# Patient Record
Sex: Male | Born: 1940 | Race: White | Hispanic: No | Smoking: Never smoker
Health system: Southern US, Community
[De-identification: ages and names within clinical notes are randomized; demographics above are authoritative.]

## PROBLEM LIST (undated history)

## (undated) DIAGNOSIS — R279 Unspecified lack of coordination: Secondary | ICD-10-CM

## (undated) DIAGNOSIS — J309 Allergic rhinitis, unspecified: Secondary | ICD-10-CM

## (undated) DIAGNOSIS — Z9181 History of falling: Secondary | ICD-10-CM

## (undated) DIAGNOSIS — R269 Unspecified abnormalities of gait and mobility: Secondary | ICD-10-CM

## (undated) DIAGNOSIS — M545 Low back pain: Secondary | ICD-10-CM

## (undated) DIAGNOSIS — L821 Other seborrheic keratosis: Secondary | ICD-10-CM

## (undated) DIAGNOSIS — G43109 Migraine with aura, not intractable, without status migrainosus: Secondary | ICD-10-CM

## (undated) DIAGNOSIS — R270 Ataxia, unspecified: Secondary | ICD-10-CM

## (undated) DIAGNOSIS — R42 Dizziness and giddiness: Secondary | ICD-10-CM

## (undated) DIAGNOSIS — R1310 Dysphagia, unspecified: Secondary | ICD-10-CM

## (undated) DIAGNOSIS — R252 Cramp and spasm: Secondary | ICD-10-CM

## (undated) DIAGNOSIS — M1990 Unspecified osteoarthritis, unspecified site: Secondary | ICD-10-CM

## (undated) DIAGNOSIS — K449 Diaphragmatic hernia without obstruction or gangrene: Secondary | ICD-10-CM

## (undated) DIAGNOSIS — I10 Essential (primary) hypertension: Secondary | ICD-10-CM

## (undated) DIAGNOSIS — G119 Hereditary ataxia, unspecified: Secondary | ICD-10-CM

## (undated) DIAGNOSIS — M542 Cervicalgia: Secondary | ICD-10-CM

## (undated) DIAGNOSIS — IMO0002 Reserved for concepts with insufficient information to code with codable children: Secondary | ICD-10-CM

## (undated) DIAGNOSIS — M2550 Pain in unspecified joint: Secondary | ICD-10-CM

## (undated) DIAGNOSIS — K219 Gastro-esophageal reflux disease without esophagitis: Secondary | ICD-10-CM

## (undated) DIAGNOSIS — K5730 Diverticulosis of large intestine without perforation or abscess without bleeding: Secondary | ICD-10-CM

## (undated) DIAGNOSIS — G2581 Restless legs syndrome: Secondary | ICD-10-CM

## (undated) DIAGNOSIS — E785 Hyperlipidemia, unspecified: Secondary | ICD-10-CM

## (undated) DIAGNOSIS — H9319 Tinnitus, unspecified ear: Secondary | ICD-10-CM

## (undated) DIAGNOSIS — IMO0001 Reserved for inherently not codable concepts without codable children: Secondary | ICD-10-CM

## (undated) DIAGNOSIS — R209 Unspecified disturbances of skin sensation: Secondary | ICD-10-CM

## (undated) DIAGNOSIS — H4389 Other disorders of vitreous body: Secondary | ICD-10-CM

## (undated) DIAGNOSIS — H532 Diplopia: Secondary | ICD-10-CM

## (undated) DIAGNOSIS — K209 Esophagitis, unspecified: Secondary | ICD-10-CM

## (undated) DIAGNOSIS — H9190 Unspecified hearing loss, unspecified ear: Secondary | ICD-10-CM

## (undated) DIAGNOSIS — N529 Male erectile dysfunction, unspecified: Secondary | ICD-10-CM

## (undated) DIAGNOSIS — R5381 Other malaise: Secondary | ICD-10-CM

## (undated) DIAGNOSIS — R5383 Other fatigue: Secondary | ICD-10-CM

## (undated) HISTORY — DX: Other malaise: R53.81

## (undated) HISTORY — DX: Diaphragmatic hernia without obstruction or gangrene: K44.9

## (undated) HISTORY — DX: Low back pain: M54.5

## (undated) HISTORY — DX: Unspecified hearing loss, unspecified ear: H91.90

## (undated) HISTORY — DX: Esophagitis, unspecified: K20.9

## (undated) HISTORY — DX: Other fatigue: R53.83

## (undated) HISTORY — DX: Tinnitus, unspecified ear: H93.19

## (undated) HISTORY — DX: Unspecified abnormalities of gait and mobility: R26.9

## (undated) HISTORY — PX: LAMINOTOMY / EXCISION DISK POSTERIOR CERVICAL SPINE: SUR749

## (undated) HISTORY — DX: Allergic rhinitis, unspecified: J30.9

## (undated) HISTORY — DX: Unspecified osteoarthritis, unspecified site: M19.90

## (undated) HISTORY — DX: Reserved for inherently not codable concepts without codable children: IMO0001

## (undated) HISTORY — DX: Dizziness and giddiness: R42

## (undated) HISTORY — DX: Restless legs syndrome: G25.81

## (undated) HISTORY — PX: OTHER SURGICAL HISTORY: SHX169

## (undated) HISTORY — DX: Other disorders of vitreous body: H43.89

## (undated) HISTORY — DX: Male erectile dysfunction, unspecified: N52.9

## (undated) HISTORY — DX: Diplopia: H53.2

## (undated) HISTORY — DX: Dysphagia, unspecified: R13.10

## (undated) HISTORY — DX: Unspecified disturbances of skin sensation: R20.9

## (undated) HISTORY — DX: History of falling: Z91.81

## (undated) HISTORY — DX: Migraine with aura, not intractable, without status migrainosus: G43.109

## (undated) HISTORY — PX: APPENDECTOMY: SHX54

## (undated) HISTORY — DX: Diverticulosis of large intestine without perforation or abscess without bleeding: K57.30

## (undated) HISTORY — DX: Hyperlipidemia, unspecified: E78.5

## (undated) HISTORY — DX: Other seborrheic keratosis: L82.1

## (undated) HISTORY — DX: Hereditary ataxia, unspecified: G11.9

## (undated) HISTORY — DX: Reserved for concepts with insufficient information to code with codable children: IMO0002

## (undated) HISTORY — DX: Pain in unspecified joint: M25.50

## (undated) HISTORY — DX: Cramp and spasm: R25.2

## (undated) HISTORY — DX: Unspecified lack of coordination: R27.9

## (undated) HISTORY — DX: Cervicalgia: M54.2

---

## 1980-06-01 DIAGNOSIS — K449 Diaphragmatic hernia without obstruction or gangrene: Secondary | ICD-10-CM

## 1980-06-01 HISTORY — DX: Diaphragmatic hernia without obstruction or gangrene: K44.9

## 1995-06-02 DIAGNOSIS — G2581 Restless legs syndrome: Secondary | ICD-10-CM

## 1995-06-02 DIAGNOSIS — G43109 Migraine with aura, not intractable, without status migrainosus: Secondary | ICD-10-CM

## 1995-06-02 DIAGNOSIS — K209 Esophagitis, unspecified without bleeding: Secondary | ICD-10-CM

## 1995-06-02 DIAGNOSIS — K5730 Diverticulosis of large intestine without perforation or abscess without bleeding: Secondary | ICD-10-CM

## 1995-06-02 HISTORY — DX: Diverticulosis of large intestine without perforation or abscess without bleeding: K57.30

## 1995-06-02 HISTORY — DX: Esophagitis, unspecified without bleeding: K20.90

## 1995-06-02 HISTORY — DX: Restless legs syndrome: G25.81

## 1995-06-02 HISTORY — DX: Migraine with aura, not intractable, without status migrainosus: G43.109

## 1996-06-01 DIAGNOSIS — IMO0002 Reserved for concepts with insufficient information to code with codable children: Secondary | ICD-10-CM

## 1996-06-01 HISTORY — DX: Reserved for concepts with insufficient information to code with codable children: IMO0002

## 1997-06-01 DIAGNOSIS — IMO0001 Reserved for inherently not codable concepts without codable children: Secondary | ICD-10-CM

## 1997-06-01 DIAGNOSIS — N529 Male erectile dysfunction, unspecified: Secondary | ICD-10-CM

## 1997-06-01 HISTORY — PX: OTHER SURGICAL HISTORY: SHX169

## 1997-06-01 HISTORY — DX: Male erectile dysfunction, unspecified: N52.9

## 1997-06-01 HISTORY — DX: Reserved for inherently not codable concepts without codable children: IMO0001

## 1997-11-19 ENCOUNTER — Ambulatory Visit (HOSPITAL_COMMUNITY): Admission: RE | Admit: 1997-11-19 | Discharge: 1997-11-20 | Payer: Self-pay | Admitting: Neurosurgery

## 1997-12-17 ENCOUNTER — Ambulatory Visit (HOSPITAL_COMMUNITY): Admission: RE | Admit: 1997-12-17 | Discharge: 1997-12-17 | Payer: Self-pay | Admitting: Neurosurgery

## 1998-01-07 ENCOUNTER — Ambulatory Visit (HOSPITAL_COMMUNITY): Admission: RE | Admit: 1998-01-07 | Discharge: 1998-01-07 | Payer: Self-pay | Admitting: Neurosurgery

## 1999-06-02 DIAGNOSIS — R42 Dizziness and giddiness: Secondary | ICD-10-CM

## 1999-06-02 HISTORY — DX: Dizziness and giddiness: R42

## 1999-06-02 HISTORY — PX: CARDIAC CATHETERIZATION: SHX172

## 1999-06-03 ENCOUNTER — Encounter: Payer: Self-pay | Admitting: Cardiology

## 1999-06-03 ENCOUNTER — Encounter: Payer: Self-pay | Admitting: Emergency Medicine

## 1999-06-03 ENCOUNTER — Inpatient Hospital Stay (HOSPITAL_COMMUNITY): Admission: EM | Admit: 1999-06-03 | Discharge: 1999-06-04 | Payer: Self-pay | Admitting: Emergency Medicine

## 1999-06-23 ENCOUNTER — Encounter: Payer: Self-pay | Admitting: Internal Medicine

## 1999-06-23 ENCOUNTER — Ambulatory Visit (HOSPITAL_COMMUNITY): Admission: RE | Admit: 1999-06-23 | Discharge: 1999-06-23 | Payer: Self-pay | Admitting: Internal Medicine

## 1999-06-26 ENCOUNTER — Encounter: Payer: Self-pay | Admitting: Internal Medicine

## 1999-06-26 ENCOUNTER — Ambulatory Visit (HOSPITAL_COMMUNITY): Admission: RE | Admit: 1999-06-26 | Discharge: 1999-06-26 | Payer: Self-pay | Admitting: Internal Medicine

## 1999-12-24 ENCOUNTER — Encounter: Payer: Self-pay | Admitting: Internal Medicine

## 1999-12-24 ENCOUNTER — Ambulatory Visit (HOSPITAL_COMMUNITY): Admission: RE | Admit: 1999-12-24 | Discharge: 1999-12-24 | Payer: Self-pay | Admitting: Internal Medicine

## 2000-12-08 ENCOUNTER — Encounter: Admission: RE | Admit: 2000-12-08 | Discharge: 2001-03-08 | Payer: Self-pay | Admitting: Internal Medicine

## 2001-06-01 DIAGNOSIS — R209 Unspecified disturbances of skin sensation: Secondary | ICD-10-CM

## 2001-06-01 DIAGNOSIS — E785 Hyperlipidemia, unspecified: Secondary | ICD-10-CM

## 2001-06-01 DIAGNOSIS — I10 Essential (primary) hypertension: Secondary | ICD-10-CM

## 2001-06-01 DIAGNOSIS — M545 Low back pain, unspecified: Secondary | ICD-10-CM

## 2001-06-01 DIAGNOSIS — R5383 Other fatigue: Secondary | ICD-10-CM

## 2001-06-01 DIAGNOSIS — H9190 Unspecified hearing loss, unspecified ear: Secondary | ICD-10-CM

## 2001-06-01 DIAGNOSIS — R5381 Other malaise: Secondary | ICD-10-CM

## 2001-06-01 DIAGNOSIS — K219 Gastro-esophageal reflux disease without esophagitis: Secondary | ICD-10-CM

## 2001-06-01 DIAGNOSIS — H4389 Other disorders of vitreous body: Secondary | ICD-10-CM

## 2001-06-01 HISTORY — DX: Essential (primary) hypertension: I10

## 2001-06-01 HISTORY — DX: Other malaise: R53.81

## 2001-06-01 HISTORY — DX: Other fatigue: R53.83

## 2001-06-01 HISTORY — DX: Low back pain, unspecified: M54.50

## 2001-06-01 HISTORY — DX: Gastro-esophageal reflux disease without esophagitis: K21.9

## 2001-06-01 HISTORY — DX: Unspecified hearing loss, unspecified ear: H91.90

## 2001-06-01 HISTORY — DX: Other disorders of vitreous body: H43.89

## 2001-06-01 HISTORY — DX: Hyperlipidemia, unspecified: E78.5

## 2001-06-01 HISTORY — DX: Unspecified disturbances of skin sensation: R20.9

## 2004-06-01 DIAGNOSIS — H532 Diplopia: Secondary | ICD-10-CM

## 2004-06-01 HISTORY — DX: Diplopia: H53.2

## 2005-06-01 DIAGNOSIS — J309 Allergic rhinitis, unspecified: Secondary | ICD-10-CM

## 2005-06-01 HISTORY — DX: Allergic rhinitis, unspecified: J30.9

## 2006-02-26 DIAGNOSIS — M542 Cervicalgia: Secondary | ICD-10-CM

## 2006-02-26 HISTORY — DX: Cervicalgia: M54.2

## 2006-09-03 ENCOUNTER — Encounter: Admission: RE | Admit: 2006-09-03 | Discharge: 2006-09-03 | Payer: Self-pay | Admitting: Ophthalmology

## 2007-07-13 ENCOUNTER — Ambulatory Visit: Payer: Self-pay | Admitting: Gastroenterology

## 2007-07-20 ENCOUNTER — Ambulatory Visit: Payer: Self-pay | Admitting: Gastroenterology

## 2007-07-20 ENCOUNTER — Encounter: Payer: Self-pay | Admitting: Gastroenterology

## 2007-07-20 HISTORY — PX: COLONOSCOPY: SHX174

## 2007-09-07 DIAGNOSIS — R279 Unspecified lack of coordination: Secondary | ICD-10-CM

## 2007-09-07 DIAGNOSIS — R269 Unspecified abnormalities of gait and mobility: Secondary | ICD-10-CM

## 2007-09-07 HISTORY — DX: Unspecified abnormalities of gait and mobility: R26.9

## 2007-09-07 HISTORY — DX: Unspecified lack of coordination: R27.9

## 2008-05-01 DIAGNOSIS — G119 Hereditary ataxia, unspecified: Secondary | ICD-10-CM

## 2008-05-01 HISTORY — DX: Hereditary ataxia, unspecified: G11.9

## 2009-04-13 DIAGNOSIS — L821 Other seborrheic keratosis: Secondary | ICD-10-CM

## 2009-04-13 HISTORY — DX: Other seborrheic keratosis: L82.1

## 2010-10-17 NOTE — Cardiovascular Report (Signed)
[place]. [facility]  Patient:    Justin [name]                       MRN: [id number] Proc. Date: 06/03/99 Adm. Date:  [id number] Attending:  Silvestre Mesi CC:         Lenon Curt. Cassell Clement, M.D.             Cardiac Catheterization Lab                        Cardiac Catheterization  PROCEDURE DONE BY:  Jonny Ruiz R. Aleen Campi, M.D.  PROCEDURES: 1. Left heart catheterization. 2. Coronary cineangiography. 3. Left ventricular cineangiogram. 4. Intracoronary nitroglycerin injection. 5. Abdominal aortogram.  INDICATION FOR PROCEDURES:  This 70 year old male presented to the [facility] Emergency Room after referral from Dr. Frederik Pear with anterior chest pain, sweating, and nausea.  He gave a five-day history of intermittent chest pain which was severe in his mid chest, radiating into his shoulders, arms, and neck, and associated with episodes of marked diaphoresis and nausea.  The evening before admission, he had an episode of vomiting and also had an episode of vomiting just prior to admission.  He has a strong family history of coronary artery disease nd was felt to need analysis of his coronary status because of the five-day history of chest pain associated with his strong family history of coronary artery disease.  PROCEDURE:  After signing an informed consent, the patient was premedicated with 50 mg of Benadryl intravenously and brought to the cardiac catheterization lab from the emergency room at [facility].  His right groin was prepped and draped in a sterile fashion and anesthetized locally with 1% lidocaine.  A 6-French introducer sheath was inserted percutaneously into the right femoral artery. The 6-French #4 Judkins coronary catheters were used to make injections into the coronary arteries.  The right coronary catheter was used to make an injection of nitroglycerin into the right coronary artery.  A 6-French pigtail catheter  was sed to measure pressures in the left ventricle and aorta and to make mid stream injections into the left ventricle and mid abdominal aorta.  The patient tolerated the procedure well, and no complications were noted.  At the end of the procedure, the catheter and sheath were removed from the right femoral artery, and hemostasis was easily obtained with a Perclose closure system.  The patient was then admitted to a telemetry unit for further monitoring and for continued care of his chest pain, hypertension, and nausea.  MEDICATIONS GIVEN:  Nitroglycerin 200 units intracoronary right coronary artery.  HEMODYNAMIC DATA:  Left ventricular pressure 110/0-11, aortic pressure 109/68 with a mean of 85.  Left ventricular ejection fraction was estimated at 60-70%.  CINE FINDINGS:  Coronary cineangiography: 1. Left coronary artery:  The ostium and left main appear normal. 2. Left anterior descending artery:  The proximal LAD appears normal.  The mid AD    has an area of significant muscle bridging with significant narrowing of the mid    LAD during systole and a short segment.  In average of years, this narrowing    appears to be approximately 70-80%  The same segment appears to be essentially    normal during diastole. 3. The circumflex coronary artery appears normal.  The circumflex is a large    dominant vessel supplying the posterolateral circulation to the left ventricle.    The  posterior descending is supplied from the right coronary artery.  There s    very normal flow throughout the right coronary system with normal transit time    and normal clearing in the LAD and circumflex. 4. The right coronary artery is a moderate sized vessel which terminates with the    posterior descending.  The ostium has a mild to moderate atherosclerotic plaque    which appears to be approximately 50% stenotic during the initial injections but    post nitroglycerin injection, there was a  mild improvement in the ostial    narrowing and with a residual lesion of approximately 30-40%.  There was    improvement of flow.  The initial injections into the right coronary artery    showed marked slow flow with very poor runoff which was delayed and also caused    a marked hypotensive response.  Following the nitroglycerin injection, there was    improvement of the narrowing in the proximal lesion and also improved runoff    with significant improvement in transit time.  There was no evidence of distal    blockage or distal clots or blunted distal vessels.  Left ventricular cineangiogram:  The left ventricular chamber size and contractility appear normal.  The left ventricular wall thickness appears normal. The overall left ventricular contractility is normal with abnormally moving segments, and the ejection fraction was normal at approximately 60-70%.  The mitral valve appears normal without calcification, insufficiency, or prolapse.  The aortic valve appears normal.  Abdominal aortogram:  The abdominal aorta appears normal with normal flow, no evidence of atherosclerotic plaque, and there is normal bifurcation and iliac arteries.  The renal arteries are normal.  FINAL DIAGNOSES: 1. 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 2. 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 3. Marked slow flow in the right coronary artery which improved after intracoronary    nitroglycerin injection. 4. Hypotensive response to right coronary artery injection which improved after    emesis. 5. Normal left ventricular function. 6. Normal mitral and aortic valves. 7. Normal abdominal aorta and renal arteries. 8. Successful Perclose of the right femoral artery puncture site.  DISPOSITION:  Will admit to telemetry and request that Dr. Chilton Si follow him for his continued nausea and mildly elevated amylase  and white count.  Will also start  low-dose beta blocker for the probability of a neurovascular problem and also for his significant muscle bridging of his mid LAD.DD:  06/03/99 TD:  06/03/99 Job: [id number]  [id number]

## 2010-10-17 NOTE — H&P (Signed)
[place]. [facility]  Patient:    Justin [name]                       MRN: [id number] Adm. Date:  [id number] Attending:  Silvestre Mesi Dictator:   Aram Candela. Aleen Campi, M.D. CC:         Lenon Curt. Chilton Si, M.D.                         History and Physical  CHIEF COMPLAINT:  Chest pain, sweating, and nausea.  HISTORY OF PRESENT ILLNESS:  This 70 year old male had the onset of anterior chest discomfort approximately five days ago which radiated to his arms, shoulders, and neck, and was associated with episodes of marked sweating and nausea.  He increased his dose of Prevacid several days ago, which initially helped but later the same symptoms continued and developed a severe episode this morning which brought him to the physicians office and then referred to the emergency room for evaluation. e had severe chest pain this morning that was associated with marked diaphoresis,  nausea, and had vomiting.  In the emergency room he also had vomiting of approximately 1/2 liter of bile-colored material.  He has a history of chest pain approximately five years ago and had a treadmill exercise tolerance test done at the office which was normal.  He has had chest pain off and on since then which has been felt to be gastrointestinal.  RISK FACTORS:  The patient has a very strong family history of ischemic heart disease, with mother and father both with coronary artery disease and ischemic heart disease.  He has intermittent elevation of blood pressure but is not on a  hypertensive medication.  He denies having a history of diabetes, and his cholesterol has been mildly elevated with a mildly elevated total cholesterol and LDL cholesterol.  He denies history of smoking.  PAST MEDICAL HISTORY:  The patient has a history of hiatal hernia and esophagitis, which has been treated with Prevacid.  He also has a history of degenerative disk disease treated with Vioxx,  and has a history of surgery.  He has a history of headaches with migraine in 1997.  He also has a history of diverticulitis.  REVIEW OF SYSTEMS:  Significant in that he has a history of degenerative disk disease and is status post surgical spine surgery.  He also has a history of impotence.  ALLERGIES:  No known drug allergies.  MEDICATIONS:  Prevacid and Vioxx.  PHYSICAL EXAMINATION:  GENERAL:  The patient is alert, well oriented, pleasant, and is fairly comfortable at the time of my exam.  VITAL SIGNS:  He was afebrile.  Pulse 92 and regular.  Respirations 20 and non-labored.  Blood pressure 148/90.  SKIN:  Warm and dry.  HEENT:  Head normocephalic.  Eyes:  External eye exam normal.  Funduscopic exam  normal.  Ears, nose, and throat clear.  NECK:  Supple, normal carotid pulses, normal carotid bruits.  No jugular venous  distention.  There is a posterior neck surgical scar.  CHEST:   Symmetrical with good expansion bilaterally.  Normal breath sounds. No rales, rhonchi, or wheezes.  CARDIOVASCULAR:  Normal PMI, regular rhythm.  Normal first and second heart sounds. No murmur, gallop, rub, or click.  ABDOMEN:  Flat, nontender.  Normal bowel sounds.  No abnormal pulsations.  GU/RECTAL:  Deferred.  EXTREMITIES:  Full range of motion.  No  peripheral edema.  Normal peripheral pulses.  NEUROLOGIC:  Grossly intact.   ADMISSION IMPRESSION: 1.  Chest pain, severe, intermittent for five days, with strong family history     of ischemic heart disease. 2.  Hypertension, mild. 3.  History of hiatal hernia and esophagitis. 4.  History of degenerative disk disease of cervical spine.  PLAN:  After discussion of our findings with Dr. Chilton Si we felt that because of is very strong family history of ischemic heart disease associated with five days f severe chest pain, sweating, nausea and vomiting that it was imperative that his coronary status be documented.  We discussed  this with the patient and will plan to proceed with cardiac cath today. DD:  06/03/99 TD:  06/03/99 Job: [id number]

## 2011-03-03 ENCOUNTER — Encounter: Payer: Self-pay | Admitting: *Deleted

## 2011-03-03 ENCOUNTER — Other Ambulatory Visit: Payer: Self-pay

## 2011-03-03 ENCOUNTER — Emergency Department (HOSPITAL_BASED_OUTPATIENT_CLINIC_OR_DEPARTMENT_OTHER): Payer: Medicare Other

## 2011-03-03 ENCOUNTER — Emergency Department (HOSPITAL_BASED_OUTPATIENT_CLINIC_OR_DEPARTMENT_OTHER)
Admission: EM | Admit: 2011-03-03 | Discharge: 2011-03-03 | Disposition: A | Discharge status: Home / Self Care | Payer: Medicare Other | Attending: Emergency Medicine | Admitting: Emergency Medicine

## 2011-03-03 ENCOUNTER — Emergency Department (INDEPENDENT_AMBULATORY_CARE_PROVIDER_SITE_OTHER): Payer: Medicare Other

## 2011-03-03 DIAGNOSIS — R079 Chest pain, unspecified: Secondary | ICD-10-CM | POA: Insufficient documentation

## 2011-03-03 DIAGNOSIS — I10 Essential (primary) hypertension: Secondary | ICD-10-CM | POA: Insufficient documentation

## 2011-03-03 DIAGNOSIS — K219 Gastro-esophageal reflux disease without esophagitis: Secondary | ICD-10-CM | POA: Insufficient documentation

## 2011-03-03 DIAGNOSIS — J449 Chronic obstructive pulmonary disease, unspecified: Secondary | ICD-10-CM

## 2011-03-03 DIAGNOSIS — R0602 Shortness of breath: Secondary | ICD-10-CM | POA: Insufficient documentation

## 2011-03-03 DIAGNOSIS — Z79899 Other long term (current) drug therapy: Secondary | ICD-10-CM | POA: Insufficient documentation

## 2011-03-03 HISTORY — DX: Essential (primary) hypertension: I10

## 2011-03-03 HISTORY — DX: Gastro-esophageal reflux disease without esophagitis: K21.9

## 2011-03-03 LAB — CBC
HCT: 41.5 % (ref 39.0–52.0)
MCH: 30.7 pg (ref 26.0–34.0)
MCV: 87.2 fL (ref 78.0–100.0)
Platelets: 199 10*3/uL (ref 150–400)
RDW: 12.9 % (ref 11.5–15.5)

## 2011-03-03 LAB — PRO B NATRIURETIC PEPTIDE: Pro B Natriuretic peptide (BNP): 210 pg/mL — ABNORMAL HIGH (ref 0–125)

## 2011-03-03 LAB — BASIC METABOLIC PANEL
BUN: 13 mg/dL (ref 6–23)
Calcium: 9.7 mg/dL (ref 8.4–10.5)
Chloride: 105 mEq/L (ref 96–112)
Creatinine, Ser: 0.8 mg/dL (ref 0.50–1.35)
GFR calc Af Amer: >90 mL/min (ref >90)

## 2011-03-03 MED ORDER — ASPIRIN 325 MG PO TABS
325.0000 mg | ORAL_TABLET | ORAL | Status: AC
  Administered 2011-03-03: 325 mg via ORAL
  Filled 2011-03-03: qty 1

## 2011-03-03 MED ORDER — FAMOTIDINE 20 MG PO TABS: 20.0000 mg | ORAL_TABLET | Freq: Two times a day (BID) | ORAL | Status: DC

## 2011-03-03 MED ORDER — NITROGLYCERIN 0.4 MG SL SUBL
0.4000 mg | SUBLINGUAL_TABLET | SUBLINGUAL | Status: DC | PRN
  Filled 2011-03-03: qty 25

## 2011-03-03 MED ORDER — GI COCKTAIL ~~LOC~~
30.0000 mL | Freq: Once | ORAL | Status: AC
  Administered 2011-03-03: 30 mL via ORAL
  Filled 2011-03-03: qty 30

## 2011-03-03 NOTE — ED Notes (Signed)
Pt transported to radiology.

## 2011-03-03 NOTE — ED Provider Notes (Signed)
History     CSN: [id number] Arrival date & time: 03/03/2011  2:58 PM  Chief Complaint  Patient presents with  . Chest Pain    (Consider location/radiation/quality/duration/timing/severity/associated sxs/prior treatment) HPI Comments: Patient contacted his cardiologist, Dr. Rockey Situ  today. He should have only seen a cardiologist for a previous catheterization 15 years ago. He's never required any intervention. He has an appointment for this Thursday. He was told that if he continued to have some discomfort that he should come to the ED. Patient feels that this is probably all just as indigestion.  Patient is a 70 y.o. male presenting with chest pain. The history is provided by the patient. No language interpreter was used.  Chest Pain The chest pain began yesterday. Chest pain occurs intermittently. The chest pain is improving. The pain is associated with eating and exertion. At its most intense, the pain is at 6/10. The pain is currently at 2/10. The severity of the pain is mild. The quality of the pain is described as burning and similar to previous episodes (Patient says this feels like his indigestion. He did note some shortness of breath earlier in the day though. He does not currently have any shortness of breath.). The pain does not radiate. Chest pain is worsened by exertion, deep breathing and eating. Primary symptoms include shortness of breath. Pertinent negatives for primary symptoms include no fever, no fatigue, no syncope, no cough, no wheezing, no palpitations, no abdominal pain, no nausea, no vomiting and no dizziness. He tried nothing for the symptoms. Risk factors include no known risk factors. Past medical history comments: Patient has a history of GERD. He has been evaluated in the past for coronary artery disease with a catheterization 15 years ago that was completely negative.     Past Medical History  Diagnosis Date  . Hypertension   . GERD (gastroesophageal reflux disease)      Past Surgical History  Procedure Date  . Back surgery     History reviewed. No pertinent family history.  History  Substance Use Topics  . Smoking status: Former Games developer  . Smokeless tobacco: Not on file  . Alcohol Use: No      Review of Systems  Constitutional: Negative.  Negative for fever and fatigue.  HENT: Negative.   Eyes: Negative.   Respiratory: Positive for shortness of breath. Negative for cough and wheezing.   Cardiovascular: Positive for chest pain. Negative for palpitations and syncope.  Gastrointestinal: Negative.  Negative for nausea, vomiting and abdominal pain.  Genitourinary: Negative.   Musculoskeletal: Negative.   Skin: Negative.   Neurological: Negative.  Negative for dizziness.  Hematological: Negative.   Psychiatric/Behavioral: Negative.   All other systems reviewed and are negative.    Allergies  Review of patient's allergies indicates no known allergies.  Home Medications   Current Outpatient Rx  Name Route Sig Dispense Refill  . ASPIRIN 325 MG PO TBEC Oral Take 325 mg by mouth at bedtime.      . CELECOXIB 200 MG PO CAPS Oral Take 200 mg by mouth daily.      Marland Kitchen HYDROCODONE-ACETAMINOPHEN 5-500 MG PO TABS Oral Take 0.5 tablets by mouth every 6 (six) hours as needed. For headache and pain     . IBUPROFEN 200 MG PO TABS Oral Take 800 mg by mouth every 6 (six) hours as needed. For pain     . LANSOPRAZOLE 30 MG PO CPDR Oral Take 30 mg by mouth daily.      Marland Kitchen  LOSARTAN POTASSIUM 100 MG PO TABS Oral Take 100 mg by mouth daily.      Marland Kitchen METOPROLOL SUCCINATE 100 MG PO TB24 Oral Take 100 mg by mouth daily.      Marland Kitchen ONE-DAILY MULTI VITAMINS PO TABS Oral Take 1 tablet by mouth daily.      Marland Kitchen FAMOTIDINE 20 MG PO TABS Oral Take 1 tablet (20 mg total) by mouth 2 (two) times daily. 30 tablet 0    BP 124/100  Pulse 61  Temp(Src) 98.2 F (36.8 C) (Oral)  Resp 20  Ht 5\' 7"  (1.702 m)  Wt 152 lb (68.947 kg)  BMI 23.81 kg/m2  SpO2 100%  Physical Exam    Nursing note and vitals reviewed. Constitutional: He is oriented to person, place, and time. He appears well-developed and well-nourished. No distress.  HENT:  Head: Normocephalic and atraumatic.  Eyes: Conjunctivae and EOM are normal. Pupils are equal, round, and reactive to light.  Neck: Normal range of motion.  Cardiovascular: Normal rate, regular rhythm and normal heart sounds.  Exam reveals no gallop and no friction rub.   No murmur heard. Pulmonary/Chest: Effort normal and breath sounds normal. No respiratory distress. He has no wheezes. He has no rales.  Abdominal: Soft. Bowel sounds are normal. He exhibits no distension. There is no tenderness. There is no rebound and no guarding.  Musculoskeletal: Normal range of motion. He exhibits no edema and no tenderness.  Neurological: He is alert and oriented to person, place, and time. No cranial nerve deficit. He exhibits normal muscle tone. Coordination normal.       Patient does have slightly slurred speech. 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 This is always worse in the afternoons. He reports that this is definitely not a change from his baseline.  Skin: Skin is warm and dry. No rash noted. No erythema.  Psychiatric: He has a normal mood and affect.    ED Course  Procedures (including critical care time)  Labs Reviewed  CBC - Abnormal; Notable for the following:    WBC 11.0 (*)    All other components within normal limits  BASIC METABOLIC PANEL - Abnormal; Notable for the following:    Glucose, Bld 143 (*)    GFR calc non Af Amer 89 (*)    All other components within normal limits  PRO B NATRIURETIC PEPTIDE - Abnormal; Notable for the following:    BNP, POC 210.0 (*)    All other components within normal limits  TROPONIN I  POCT CARDIAC MARKERS   Dg Chest 2 View  03/03/2011  *RADIOLOGY REPORT*  Clinical Data: Chest pain  CHEST - 2 VIEW  Comparison: None.  Findings: Normal heart size.  Clear  lungs.  No pneumothorax.  No pleural effusion. Mild bronchitic changes.  Mild hyperaeration. Calcifications at the lung apices are likely related to granulomatous disease.  IMPRESSION: No active cardiopulmonary disease. 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  Original Report Authenticated By: Donavan Burnet, M.D.     No diagnosis found.   Date: 03/03/2011  Rate: 63  Rhythm: normal sinus rhythm  QRS Axis: normal  Intervals: normal  ST/T Wave abnormalities: normal  Conduction Disutrbances:none  Narrative Interpretation: Normal EKG.  Old EKG Reviewed: unchanged   MDM  Patient was evaluated by myself and did have elevated blood pressure. A repeat vital sign was requested. Patient has a history of hypertension but didn't take his Toprol this morning. The patient says that currently he  only feels like he is having indigestion. He came in as he had had significant chest pain yesterday which has since resolved. Then this morning he had noted shortness of breath. He was unable to be seen by his cardiologist today. As a result patient made an appointment for Thursday but continued to not have complete resolution of his symptoms. For this reason he presented to be evaluated. He has had a CBC, been met, troponin, chest x-ray, and EKG ordered. Patient was given aspirin. He also was given a GI cocktail as he felt that his symptoms reflected his prior indigestion.  EKG was completely within normal limits.  4:07 PM patient with mild leukocytosis of 11. BNP is mildly elevated at 210. Glucose is 143. Otherwise lab workup thus far is within normal limits. Chest x-ray and troponin pending.  5:20 PM Chest x-ray was unremarkable. Patient did have prolonged stay as initial orders that included pot labs which were not recognized by our laboratory here. A troponin was sent and this was negative. Patient felt much better after GI cocktail. He had no symptoms remaining. Patient was written a prescription for Pepcid. He can  followup with his cardiologist as previously scheduled on Thursday if symptoms persist. Patient was able to be discharged home in good condition.  Patient vital signs were improved prior to discharge.     Cyndra Numbers, MD 03/03/11 1725

## 2011-03-03 NOTE — ED Notes (Signed)
Chest tightness with radiation into his neck, lower back and left arm. Feels sob. Symptoms started yesterday. Took advil and a headache tablet per pt.

## 2011-03-03 NOTE — ED Notes (Signed)
Telemetry and IV d/c'd prior to d/c.

## 2011-03-05 ENCOUNTER — Encounter: Payer: Self-pay | Admitting: Cardiovascular Disease

## 2011-03-05 ENCOUNTER — Ambulatory Visit (INDEPENDENT_AMBULATORY_CARE_PROVIDER_SITE_OTHER): Payer: Medicare Other | Admitting: Cardiovascular Disease

## 2011-03-05 DIAGNOSIS — R079 Chest pain, unspecified: Secondary | ICD-10-CM | POA: Insufficient documentation

## 2011-03-05 NOTE — Patient Instructions (Addendum)
Your physician wants you to follow-up in: 6 month. You will receive a reminder letter in the mail two months in advance. If you don't receive a letter, please call our office to schedule the follow-up appointment.  Your physician recommends that you return for lab work in: 6 months/ fasting labs, bmet, lipids, hepatic function  Your physician recommends that you continue on your current medications as directed. Please refer to the Current Medication list given to you today.

## 2011-03-05 NOTE — Assessment & Plan Note (Signed)
Has recent onset of chest pain and pressure. These symptoms resolved very quickly when he was in the emergency room when he was given a GI cocktail. He has a history of cardiac catheterization in  the past. Dr. Aleen Campi found that he had a muscular bridge in his mid left anterior descending artery.  At this point I don't think that he has any significant coronary artery disease. I'll see him back in 6 months for an office visit, EKG, and fasting labs. I've asked him to call me sooner if he has any other problems.

## 2011-03-05 NOTE — Progress Notes (Signed)
Justin [name] Date of Birth  02/15/1941 [facility] [address]    [address] [place], [place]  [address] [phone]  Fax  [phone]  History of Present Illness:  Justin [name] is a 70 year old gentleman with a recent onset of chest pain. He's been having episodes of chest pressure at various times through the day. The episodes are typically get worse with exertion. It would last for many hours through the day.  He was seen in the freestanding emergency room in [facility] and was given a GI cocktail. This resolved chest pain fairly quickly and they diagnosed him with gastroesophageal reflux disease.  He still has a little bit of chest pain but overall it is much better.  He is working out several times a week on the elliptical machine. He's not had any episodes of chest pain or shortness breath with that.  He has occasional palpitations at night that sound like PVCs.  Current Outpatient Prescriptions on File Prior to Visit  Medication Sig Dispense Refill  . aspirin 325 MG EC tablet Take 325 mg by mouth at bedtime.        . celecoxib (CELEBREX) 200 MG capsule Take 200 mg by mouth daily.        . famotidine (PEPCID) 20 MG tablet Take 1 tablet (20 mg total) by mouth 2 (two) times daily.  30 tablet  0  . HYDROcodone-acetaminophen (VICODIN) 5-500 MG per tablet Take 0.5 tablets by mouth every 6 (six) hours as needed. For headache and pain       . ibuprofen (ADVIL,MOTRIN) 200 MG tablet Take 800 mg by mouth every 6 (six) hours as needed. For pain       . lansoprazole (PREVACID) 30 MG capsule Take 30 mg by mouth daily.        Marland Kitchen losartan (COZAAR) 100 MG tablet Take 100 mg by mouth daily.        . metoprolol (TOPROL-XL) 100 MG 24 hr tablet Take 100 mg by mouth daily.        . Multiple Vitamin (MULTIVITAMIN) tablet Take 1 tablet by mouth daily.          No Known Allergies  Past Medical History  Diagnosis Date  . Hypertension   . GERD (gastroesophageal reflux disease)   . Chest pain      Past Surgical History  Procedure Date  . Back surgery   . Laminotomy / excision disk posterior cervical spine     Spinal Disk (?4-5)  . Appendectomy Age 33  . Cardiac catheterization     62yrs ago    History  Smoking status  . Former Smoker  Smokeless tobacco  . Not on file    History  Alcohol Use  . Yes    Occas. Wine     History reviewed. No pertinent family history.  Reviw of Systems:  Reviewed in the HPI.  All other systems are negative.  Physical Exam: BP 136/86  Pulse 60  Ht 5\' 7"  (1.702 m)  Wt 157 lb 1.9 oz (71.269 kg)  BMI 24.61 kg/m2 The patient is alert and oriented x 3.  The mood and affect are normal.   Skin: warm and dry.  Color is normal.    HEENT:   the sclera are nonicteric.  The mucous membranes are moist.  The carotids are 2+ without bruits.  There is no thyromegaly.  There is no JVD.    Lungs: clear.  The chest wall is non tender.  Heart: regular rate with a normal S1 and S2.  There are no murmurs, gallops, or rubs. The PMI is not displaced.     Abdomen: good bowel sounds.  There is no guarding or rebound.  There is no hepatosplenomegaly or tenderness.  There are no masses.   Extremities:  no clubbing, cyanosis, or edema.  The legs are without rashes.  The distal pulses are intact.   Neuro:  Cranial nerves II - XII are intact.  Motor and sensory functions are intact.    The gait is normal.  ECG: Done in ER , results pending.  Assessment / Plan:

## 2011-04-15 DIAGNOSIS — Z9181 History of falling: Secondary | ICD-10-CM

## 2011-04-15 DIAGNOSIS — H9319 Tinnitus, unspecified ear: Secondary | ICD-10-CM

## 2011-04-15 HISTORY — DX: Tinnitus, unspecified ear: H93.19

## 2011-04-15 HISTORY — DX: History of falling: Z91.81

## 2011-06-15 ENCOUNTER — Ambulatory Visit (INDEPENDENT_AMBULATORY_CARE_PROVIDER_SITE_OTHER): Payer: Medicare Other | Admitting: General Surgery

## 2011-06-15 ENCOUNTER — Encounter (INDEPENDENT_AMBULATORY_CARE_PROVIDER_SITE_OTHER): Payer: Self-pay | Admitting: General Surgery

## 2011-06-15 VITALS — BP 128/83 | HR 78 | Temp 97.4°F | Resp 16 | Ht 66.0 in | Wt 159.6 lb

## 2011-06-15 DIAGNOSIS — K610 Anal abscess: Secondary | ICD-10-CM

## 2011-06-15 DIAGNOSIS — K612 Anorectal abscess: Secondary | ICD-10-CM

## 2011-06-15 NOTE — Patient Instructions (Signed)
Remove packing Thursday night after returning.  Keep covered as this will continue to drain.  Complete course of antibiotics.  Call me if you have any additional problems or questions.

## 2011-06-15 NOTE — Progress Notes (Signed)
Patient ID: Justin [name], male   DOB: 12-19-1940, 71 y.o.   MRN: [id number]  Chief Complaint  Patient presents with  . Rectal Pain    HPI Justin [name] is a 71 y.o. male.  Referred by Dr. Ricki Miller HPI This is a 71 year old male who has over a week of perianal abscess he. The right side spontaneously decompressed as been doing well from that. The left side has been worse and he was seen in urgent care on Friday. He underwent incision and drainage. He did do packing on Sunday but due to continued pain he was seen here again today. This area still remains red and still remains painful to him. He was then sent over here for further evaluation. No trouble with his bowel movements. He has no real history of any obese ever before. He has been on antibiotics.  Past Medical History  Diagnosis Date  . Hypertension   . GERD (gastroesophageal reflux disease)   . Chest pain     Past Surgical History  Procedure Date  . Back surgery   . Laminotomy / excision disk posterior cervical spine     Spinal Disk (?4-5)  . Appendectomy Age 6  . Cardiac catheterization     15 yrs ago    No family history on file.  Social History History  Substance Use Topics  . Smoking status: Former Games developer  . Smokeless tobacco: Not on file  . Alcohol Use: Yes     Occas. Wine     No Known Allergies  Current Outpatient Prescriptions  Medication Sig Dispense Refill  . aspirin 325 MG EC tablet Take 325 mg by mouth at bedtime.        . celecoxib (CELEBREX) 200 MG capsule Take 200 mg by mouth daily.        . famotidine (PEPCID) 20 MG tablet Take 1 tablet (20 mg total) by mouth 2 (two) times daily.  30 tablet  0  . HYDROcodone-acetaminophen (VICODIN) 5-500 MG per tablet Take 0.5 tablets by mouth every 6 (six) hours as needed. For headache and pain       . ibuprofen (ADVIL,MOTRIN) 200 MG tablet Take 800 mg by mouth every 6 (six) hours as needed. For pain       . lansoprazole (PREVACID) 30 MG capsule Take 30 mg by mouth  daily.        Marland Kitchen losartan (COZAAR) 100 MG tablet Take 100 mg by mouth daily.        . metoprolol (TOPROL-XL) 100 MG 24 hr tablet Take 100 mg by mouth daily.        . Multiple Vitamin (MULTIVITAMIN) tablet Take 1 tablet by mouth daily.          Review of Systems Review of Systems  Constitutional: Negative for fever, chills and unexpected weight change.  HENT: Negative for hearing loss, congestion, sore throat, trouble swallowing and voice change.   Eyes: Negative for visual disturbance.  Respiratory: Negative for cough and wheezing.   Cardiovascular: Negative for chest pain, palpitations and leg swelling.  Gastrointestinal: Negative for nausea, vomiting, abdominal pain, diarrhea, constipation, blood in stool, abdominal distention, anal bleeding and rectal pain.  Genitourinary: Negative for hematuria and difficulty urinating.  Musculoskeletal: Negative for arthralgias.  Skin: Negative for rash and wound.  Neurological: Positive for headaches. Negative for seizures, syncope and weakness.  Hematological: Negative for adenopathy. Does not bruise/bleed easily.  Psychiatric/Behavioral: Negative for confusion.    Blood pressure 128/83, pulse 78, temperature 97.4 F (  36.3 C), temperature source Temporal, resp. rate 16, height 5\' 6"  (1.676 m), weight 159 lb 9.6 oz (72.394 kg).  Physical Exam Physical Exam  Constitutional: He appears well-developed and well-nourished.  Genitourinary:         Assessment    Perianal abscess    Plan    This area needs to be further drained. I discussed this with him. I cleansed the area. I then infiltrated 1% lidocaine throughout the area. I remove the necrotic area as well as making a elliptical incision overlying this area. A good portion of this was indurated tissue. There was a fair amount of murky fluid as well as some purulence and stool present in this area the cavity was fairly large and I feel like I open adequately today. I then packed this with  iodoform. He can remove this on Thursday and I think that will be fine. He can then close by secondary intention. I sent call me back if he has any other further issues.       Naleyah Ohlinger 06/15/2011, 5:01 PM

## 2011-10-21 DIAGNOSIS — R252 Cramp and spasm: Secondary | ICD-10-CM

## 2011-10-21 HISTORY — DX: Cramp and spasm: R25.2

## 2012-03-06 DIAGNOSIS — IMO0002 Reserved for concepts with insufficient information to code with codable children: Secondary | ICD-10-CM

## 2012-03-06 HISTORY — DX: Reserved for concepts with insufficient information to code with codable children: IMO0002

## 2012-04-13 DIAGNOSIS — M2550 Pain in unspecified joint: Secondary | ICD-10-CM

## 2012-04-13 HISTORY — DX: Pain in unspecified joint: M25.50

## 2012-06-28 ENCOUNTER — Encounter: Payer: Self-pay | Admitting: Gastroenterology

## 2012-10-10 ENCOUNTER — Other Ambulatory Visit: Payer: Self-pay | Admitting: *Deleted

## 2012-10-10 ENCOUNTER — Other Ambulatory Visit: Payer: Medicare Other

## 2012-10-10 DIAGNOSIS — K2100 Gastro-esophageal reflux disease with esophagitis, without bleeding: Secondary | ICD-10-CM

## 2012-10-10 DIAGNOSIS — I10 Essential (primary) hypertension: Secondary | ICD-10-CM

## 2012-10-10 DIAGNOSIS — E785 Hyperlipidemia, unspecified: Secondary | ICD-10-CM

## 2012-10-11 LAB — COMPREHENSIVE METABOLIC PANEL
Albumin: 4 g/dL (ref 3.5–4.8)
Alkaline Phosphatase: 122 IU/L — ABNORMAL HIGH (ref 39–117)
BUN/Creatinine Ratio: 15 (ref 10–22)
BUN: 15 mg/dL (ref 8–27)
CO2: 25 mmol/L (ref 19–28)
Creatinine, Ser: 0.98 mg/dL (ref 0.76–1.27)
Globulin, Total: 1.9 g/dL (ref 1.5–4.5)
Glucose: 88 mg/dL (ref 65–99)
Total Protein: 5.9 g/dL — ABNORMAL LOW (ref 6.0–8.5)

## 2012-10-11 LAB — LIPID PANEL
Chol/HDL Ratio: 3.8 ratio units (ref 0.0–5.0)
HDL: 48 mg/dL (ref >39)
LDL Calculated: 101 mg/dL — ABNORMAL HIGH (ref 0–99)
Triglycerides: 169 mg/dL — ABNORMAL HIGH (ref 0–149)
VLDL Cholesterol Cal: 34 mg/dL (ref 5–40)

## 2012-10-11 LAB — CBC WITH DIFFERENTIAL/PLATELET
Eos: 2 % (ref 0–5)
HCT: 41.1 % (ref 37.5–51.0)
Hemoglobin: 13.7 g/dL (ref 12.6–17.7)
Immature Granulocytes: 0 % (ref 0–2)
Lymphocytes Absolute: 2 10*3/uL (ref 0.7–3.1)
Monocytes: 10 % (ref 4–12)
Neutrophils Absolute: 3.6 10*3/uL (ref 1.4–7.0)
RDW: 13.7 % (ref 12.3–15.4)
WBC: 6.5 10*3/uL (ref 3.4–10.8)

## 2012-10-12 ENCOUNTER — Encounter: Payer: Self-pay | Admitting: Internal Medicine

## 2012-10-12 ENCOUNTER — Encounter: Payer: Self-pay | Admitting: *Deleted

## 2012-10-12 ENCOUNTER — Ambulatory Visit (INDEPENDENT_AMBULATORY_CARE_PROVIDER_SITE_OTHER): Payer: Medicare Other | Admitting: Internal Medicine

## 2012-10-12 VITALS — BP 136/84 | HR 56 | Temp 98.1°F | Resp 14 | Ht 66.0 in | Wt 106.0 lb

## 2012-10-12 DIAGNOSIS — M545 Low back pain, unspecified: Secondary | ICD-10-CM

## 2012-10-12 DIAGNOSIS — R1310 Dysphagia, unspecified: Secondary | ICD-10-CM

## 2012-10-12 DIAGNOSIS — E785 Hyperlipidemia, unspecified: Secondary | ICD-10-CM | POA: Insufficient documentation

## 2012-10-12 DIAGNOSIS — R5381 Other malaise: Secondary | ICD-10-CM

## 2012-10-12 DIAGNOSIS — K219 Gastro-esophageal reflux disease without esophagitis: Secondary | ICD-10-CM

## 2012-10-12 DIAGNOSIS — I10 Essential (primary) hypertension: Secondary | ICD-10-CM

## 2012-10-12 DIAGNOSIS — R269 Unspecified abnormalities of gait and mobility: Secondary | ICD-10-CM

## 2012-10-12 DIAGNOSIS — R5383 Other fatigue: Secondary | ICD-10-CM

## 2012-10-12 DIAGNOSIS — N529 Male erectile dysfunction, unspecified: Secondary | ICD-10-CM

## 2012-10-12 DIAGNOSIS — G119 Hereditary ataxia, unspecified: Secondary | ICD-10-CM

## 2012-10-12 MED ORDER — TADALAFIL 20 MG PO TABS: ORAL_TABLET | ORAL | Status: DC

## 2012-10-12 NOTE — Progress Notes (Signed)
Date: 10/12/2012  MRN:  [id number] Name:  Justin [name] Sex:  male Age:  72 y.o. DOB:July 03, 1940   [facility] #: [id number]                       Provider: Murray Hodgkins M.D.  Emergency Contacts: Contact Information   Name Relation Home Work [place]  [phone]  [phone]    Quadry Kampa Wife    [phone]          [phone]    Code Status: Living will  Allergies: Allergies  Allergen Reactions  . Aciphex (Rabeprazole Sodium)   . Aleve (Naproxen Sodium)   . Effexor (Venlafaxine Hcl)   . Serzone (Nefazodone)   . Vivactil (Protriptyline Hcl)      Chief Complaint  Patient presents with  . Annual Exam     HPI: Patient has no spinocerebellar degeneration. He has an upcoming appointment at [facility] with Dr. Peterson Lombard  Speech is worse. Dysarthria. Tries to compensate by talking slower.  Walking is more unstable.  Swallowing is OK. No recent choking  spells.  Feels like he may fall forward after getting up   Stiff on standing.  Shoulders hurt when raising them. Left is worse than right. Has both pain and weakness.  Past Medical History  Diagnosis Date  . Hypertension 2003  . GERD (gastroesophageal reflux disease) 2003  . Chest pain   . Other seborrheic keratosis 2013  . Pain in joint, site unspecified 04/13/12  . Cramp of limb 10/21/11  . Unspecified tinnitus 04/15/11  . Personal history of fall 04/15/11  . Other seborrheic keratosis 04/13/09  . 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 Abnormality of gait September 07 2007  . Lack of coordination September 07, 2007  . Cervicalgia February 26, 2006  . Allergic rhinitis, cause unspecified 2007  . Diplopia 2006  . Other and unspecified hyperlipidemia 2003  . Other disorders of vitreous 2003  . Unspecified hearing loss 2003  . Lumbago 2003  . Other malaise and fatigue 2003  . Disturbance of skin sensation 2003  . Dizziness and giddiness 2001  . Impotence of organic  origin 1999  . Myalgia and myositis, unspecified 1999  . Dysphagia, unspecified 1999 and  . Degeneration of intervertebral disc, site unspecified 1998  . Restless legs syndrome (RLS) 1997  . Migraine with aura, without mention of intractable migraine without mention of status migrainosus 1997  . Diverticulosis of colon (without mention of hemorrhage) 1997  . Esophagitis, unspecified 1997  . Diaphragmatic hernia without mention of obstruction or gangrene 1982    Hiatal hernia  . 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 C3-4 anterior cervical surgery  1999    Dr. Gerlene Fee  . Laminotomy / excision disk posterior cervical spine      Spinal Disk (?4-5)  . Appendectomy  Age 30  . Cardiac catheterization  2001    Dr. Aleen Campi  . Boil removed      Dr. Dwain Sarna  . Colonoscopy  07/20/07    Dr. Jarold Motto     Procedures: 1982 - EGD (Dr. Jarold Motto): Chronic reflux and esophagitis. 11/1993 - ETT:  Normal 03/1995 - PNVC:  Normal 07/1995 - Colonoscopy (Dr. Jarold Motto):  Diverticulosis 07/1995 - Holter:  Rare PAC 07/1995 - CT Brain:  Normal 10/1996 - MRI Neck:  Cervical Degenerative Disc Disease 1998 - Sleep Study:  Negative 02/27/1999 MRI  Lumbar Spine  02/27/2007 MRI Cervical Spine  06/03/1999 EKG  06/1999 - Gastric Emptying:  Delayed 06/1999 - UGI:  Esophagitis 06/1999 - Heart Catheterization (Dr. Aleen Campi):  Mild CAD 12/24/1999 MRI of the Brain Pre and Post Infusion  01/26/2001 - Colonoscopy (Dr. Jarold Motto) 01/26/2001 EGD 09/03/2006 MRI of the Brain: normal except sinusitis involving ethmoids, frontal, and maxillary 07/20/2007 Colonoscopy Jarold Motto): diverticulosis, internal hemorrhoids 07/20/2007 EGD: HH, gastritis, Chronic GERD; dilated Insurance account manager    Consultants: Dr. Etta Grandchild - Urologist Dr. Jarold Motto - Gastroenterologist Dr. Delbert Harness - Orthopedics Dr. Meryl Crutch - Headache Center Dr. Laurine Blazer -  Optometrist Dr. Gerlene Fee - Neurosurgeon Dr. Farris Has - Physical Therapy  - Chiropractor Dr. Arletha Grippe - ENT Dr. Lovell Sheehan - General Surgeon Dr. Nedra Hai - ENT Dr. Lenard Forth - ENT Dr. Lonni Fix - Dermatologist Dr. Fayrene Fearing, optometry, [facility]. Harlen [facility]  Dr. Emelia Loron: General surgeon     Current Outpatient Prescriptions  Medication Sig Dispense Refill  . aspirin 325 MG EC tablet Take 325 mg by mouth at bedtime.        . celecoxib (CELEBREX) 200 MG capsule Take 200 mg by mouth daily.        Marland Kitchen HYDROcodone-acetaminophen (NORCO/VICODIN) 5-325 MG per tablet Take 1 tablet by mouth every 6 (six) hours as needed for pain. Take one tablet every 6 hours as needed to control pain      . lansoprazole (PREVACID) 30 MG capsule Take 30 mg by mouth daily. Take one tablet once a day to help acid reflux      . losartan (COZAAR) 100 MG tablet Take 100 mg by mouth daily.        . metoprolol (TOPROL-XL) 100 MG 24 hr tablet Take 100 mg by mouth daily.        . Misc Natural Products (OSTEO BI-FLEX JOINT SHIELD) TABS Take by mouth. Take one tablet twice a day for joint pains.      . mometasone (NASONEX) 50 MCG/ACT nasal spray Place 2 sprays into the nose daily. 1 Puff in each nostril for allergies      . Multiple Vitamin (MULTIVITAMIN) tablet Take 1 tablet by mouth daily.        . tadalafil (CIALIS) 10 MG tablet Take 10 mg by mouth daily as needed for erectile dysfunction. Take one tablet piror to intercourse       No current facility-administered medications for this visit.     There is no immunization history on file for this patient.   Diet: regular  History  Substance Use Topics  . Smoking status: Former Games developer  . Smokeless tobacco: Not on file  . Alcohol Use: Yes     Comment: Occas. Wine     Family history  Father: Deceased. Spinocerebellar disease, coronary artery disease. Mother: Living. Hypertension. Heart valve replacement. Siblings: None Children:   Son  IT consultant) living and well  Daughter Eunice Blase) living and well Paternal uncles:   Marilu Favre: Cerebellar ataxia  Odell: cerebellar ataxia  2 brothers deceased with cancers in others are unknown Paternal aunts: Mary Combs: Cerebellar ataxia  Another aunt is unknown     Review of systems Constitutional: negative Eyes: positive for contacts/glasses and dry, negative for glaucoma and irritation Ears, nose, mouth, throat, and face: positive for hearing loss and tinnitus, negative for epistaxis Respiratory: negative for asthma, cough, emphysema and wheezing Cardiovascular: negative for chest pressure/discomfort, dyspnea, fatigue, irregular heart beat and palpitations Gastrointestinal: positive for dyspepsia and dysphagia, negative for change in bowel  habits, nausea and vomiting Genitourinary:negative Integument/breast: positive for dryness Hematologic/lymphatic: negative Musculoskeletal:positive for arthralgias, back pain and uunsteady gait, negative for bone pain Neurological: positive for coordination problems, gait problems, speech problems, tremors and weakness, negative for memory problems Behavioral/Psych: negative Endocrine: negative Allergic/Immunologic: negative  Vital signs: BP 136/84  Pulse 56  Temp(Src) 98.1 F (36.7 C) (Oral)  Resp 14  Ht 5\' 6"  (1.676 m)  Wt 160 lb 12.8 oz (72.938 kg)  BMI 25.97 kg/m2  General Appearance:    Alert, cooperative, no distress, appears stated age  Head:    Normocephalic, without obvious abnormality, atraumatic  Eyes:    PERRL, conjunctiva/corneas clear, EOM's intact, fundi    benign, both eyes       Ears:    Normal TM's and external ear canals, both ears  Nose:   Nares normal, septum midline, mucosa normal, no drainage   or sinus tenderness  Throat:   Lips, mucosa, and tongue normal; teeth and gums normal  Neck:   Supple, symmetrical, trachea midline, no adenopathy;       thyroid:  No enlargement/tenderness/nodules; no carotid   bruit or  JVD  Back:     Symmetric, no curvature, ROM normal, no CVA tenderness  Lungs:     Clear to auscultation bilaterally, respirations unlabored  Chest wall:    No tenderness or deformity  Heart:    Regular rate and rhythm, S1 and S2 normal, no murmur, rub   or gallop  Abdomen:     Soft, non-tender, bowel sounds active all four quadrants,    no masses, no organomegaly  Genitalia:    Normal male without lesion, discharge or tenderness  Rectal:    Normal tone, normal prostate, no masses or tenderness;   guaiac negative stool  Extremities:    atraumatic, no cyanosis or edema. Arthritic changes. Unstable gait.   Pulses:   2+ and symmetric all extremities  Skin:   Skin color, texture, turgor normal, no rashes or lesions.seborrheic keratosis in the back.   Lymph nodes:   Cervical, supraclavicular, and axillary nodes normal  Neurologic:   CNII-XII intact. Generalized weakness. Slurred speech. Ataxic and dysarthric.      Screening Score  MMS    PHQ2 0  PHQ9     Fall Risk    BIMS    Lab reports 10/13/2010  CBC: wbc 6.1, rbc 4.71, Hemoglobin 14.3 CMP: glucose 94, BUN 14, Creatinine 0.96, Potassium 5.3 Lipid: Cholesterol 203, Triglycerides 254, HDL 44, LDL 108 PSA: 1.4 10/19/2011 CBC Wbc 7.5 Rbc 4.52 Hemoglobin 13.6  CMP Glucose 89 Bun 17 Creatinine 0.88  Lipid Panel Cholesterol 189 Triglycerides 181 Hdl 46 Ldl 107  TSH 2.330  10/21/11 EKG: rate 63.LAD.NSR. T abn in high lateral leads Appointment on 10/10/2012  Component Date Value Range Status  . WBC 10/10/2012 6.5  3.4 - 10.8 x10E3/uL Final  . RBC 10/10/2012 4.62  4.14 - 5.80 x10E6/uL Final  . Hemoglobin 10/10/2012 13.7  12.6 - 17.7 g/dL Final  . HCT 86/57/8469 41.1  37.5 - 51.0 % Final  . MCV 10/10/2012 89  79 - 97 fL Final  . MCH 10/10/2012 29.7  26.6 - 33.0 pg Final  . MCHC 10/10/2012 33.3  31.5 - 35.7 g/dL Final  . RDW 62/95/2841 13.7  12.3 - 15.4 % Final  . Neutrophils Relative % 10/10/2012 56  40 - 74 % Final  . Lymphs 10/10/2012  31  14 - 46 % Final  . Monocytes 10/10/2012 10  4 -  12 % Final  . Eos 10/10/2012 2  0 - 5 % Final  . Basos 10/10/2012 1  0 - 3 % Final  . Neutrophils Absolute 10/10/2012 3.6  1.4 - 7.0 x10E3/uL Final  . Lymphocytes Absolute 10/10/2012 2.0  0.7 - 3.1 x10E3/uL Final  . Monocytes Absolute 10/10/2012 0.7  0.1 - 0.9 x10E3/uL Final  . Eosinophils Absolute 10/10/2012 0.1  0.0 - 0.4 x10E3/uL Final  . Basophils Absolute 10/10/2012 0.0  0.0 - 0.2 x10E3/uL Final  . Immature Granulocytes 10/10/2012 0  0 - 2 % Final  . Immature Grans (Abs) 10/10/2012 0.0  0.0 - 0.1 x10E3/uL Final  . Glucose 10/10/2012 88  65 - 99 mg/dL Final  . BUN 16/03/9603 15  8 - 27 mg/dL Final  . Creatinine, Ser 10/10/2012 0.98  0.76 - 1.27 mg/dL Final  . GFR calc non Af Amer 10/10/2012 77  >59 mL/min/1.73 Final  . GFR calc Af Amer 10/10/2012 89  >59 mL/min/1.73 Final  . BUN/Creatinine Ratio 10/10/2012 15  10 - 22 Final  . Sodium 10/10/2012 138  134 - 144 mmol/L Final  . Potassium 10/10/2012 5.1  3.5 - 5.2 mmol/L Final  . Chloride 10/10/2012 102  97 - 108 mmol/L Final  . CO2 10/10/2012 25  19 - 28 mmol/L Final  . Calcium 10/10/2012 9.3  8.6 - 10.2 mg/dL Final  . Total Protein 10/10/2012 5.9* 6.0 - 8.5 g/dL Final  . Albumin 54/01/8118 4.0  3.5 - 4.8 g/dL Final  . Globulin, Total 10/10/2012 1.9  1.5 - 4.5 g/dL Final  . Albumin/Globulin Ratio 10/10/2012 2.1  1.1 - 2.5 Final  . Total Bilirubin 10/10/2012 0.7  0.0 - 1.2 mg/dL Final  . Alkaline Phosphatase 10/10/2012 122* 39 - 117 IU/L Final  . AST 10/10/2012 14  0 - 40 IU/L Final  . ALT 10/10/2012 15  0 - 44 IU/L Final  . Cholesterol, Total 10/10/2012 183  100 - 199 mg/dL Final  . Triglycerides 10/10/2012 169* 0 - 149 mg/dL Final  . HDL 14/78/2956 48  >39 mg/dL Final   Comment: According to ATP-III Guidelines, HDL-C >59 mg/dL is considered a                          negative risk factor for CHD.  Marland Kitchen VLDL Cholesterol Cal 10/10/2012 34  5 - 40 mg/dL Final  . LDL Calculated  10/10/2012 101* 0 - 99 mg/dL Final  . Chol/HDL Ratio 10/10/2012 3.8  0.0 - 5.0 ratio units Final     Annual summary: Hospitalizations:None Infection History:  None of significance Functional assessment: Remains independent in all ADL Areas of potential improvement: Unlikely Rehabilitation Potential: Likely to be functioning at highest achievable level Prognosis for survival: Fair  Plan: 1. Hypertension Controlled - CMP; Future  2. GERD (gastroesophageal reflux disease) Controlled  3. Abnormality of gait Slow progression in gait abnormality. More unstable now than in previous year.  4. Other and unspecified hyperlipidemia Controlled - Lipid Panel; Future  5. Lumbago Unchanged  6. Dysphagia, unspecified Improved  7. Impotence, organic Chronic. No change. - tadalafil (CIALIS) 20 MG tablet; One prior to intercourse  Dispense: 10 tablet; Refill: 5 - Testosterone; Future  8. 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 Spinocerebellar disease  I have encouraged this patient to continue with checkups with the neurologist every year or 2 in case there is something that can be done about this problem.I am unaware of any new therapies. This is clearly an hereditary problem with multiple other family members that have been previously afflicted.

## 2012-10-12 NOTE — Patient Instructions (Signed)
Continue current medications. 

## 2012-10-16 ENCOUNTER — Encounter: Payer: Self-pay | Admitting: Internal Medicine

## 2012-10-16 DIAGNOSIS — G119 Hereditary ataxia, unspecified: Secondary | ICD-10-CM | POA: Insufficient documentation

## 2012-11-16 ENCOUNTER — Other Ambulatory Visit: Payer: Self-pay | Admitting: Geriatric Medicine

## 2012-11-16 MED ORDER — LOSARTAN POTASSIUM 100 MG PO TABS: 100.0000 mg | ORAL_TABLET | Freq: Every day | ORAL | Status: DC

## 2012-12-12 ENCOUNTER — Ambulatory Visit (INDEPENDENT_AMBULATORY_CARE_PROVIDER_SITE_OTHER): Payer: Medicare Other | Admitting: Internal Medicine

## 2012-12-12 ENCOUNTER — Encounter: Payer: Self-pay | Admitting: Internal Medicine

## 2012-12-12 VITALS — BP 134/88 | HR 74 | Temp 98.0°F | Resp 14 | Ht 66.0 in | Wt 161.0 lb

## 2012-12-12 DIAGNOSIS — L508 Other urticaria: Secondary | ICD-10-CM

## 2012-12-12 MED ORDER — FEXOFENADINE HCL 180 MG PO TABS: 180.0000 mg | ORAL_TABLET | Freq: Every day | ORAL | Status: DC

## 2012-12-12 NOTE — Progress Notes (Signed)
Patient ID: Justin [name], male   DOB: 03/01/1941, 72 y.o.   MRN: [id number] Location:  [facility]   Allergies  Allergen Reactions  . Aciphex (Rabeprazole Sodium)   . Aleve (Naproxen Sodium)   . Effexor (Venlafaxine Hcl)   . Serzone (Nefazodone)   . Vivactil (Protriptyline Hcl)     Chief Complaint  Patient presents with  . Blister    on hands and feet    HPI: Patient is a 72 y.o. white male seen in the office today for acute visit due to blisters on hands and feet.  His wife is my pt.  Papulovesicular rash on hands and feet.  No recent med changes.  Last week had two places on right medial antecubital fossa.  Has had some difficulty with boils over the past year under armpits.  Is using doxycycline for this.  Thursday, got bad sore throat, but no other evidence of cold.  Had fever 101.  Legs very weak, tight, stiff.  Took an extra celebrex and one doxycycline.  Has taken it before.  Felt better Friday morning.  By lunch, hands breaking out in blisters.  By sat am, covered with blisters--hurt, itch, hard to walk.    Review of Systems:  Review of Systems  Constitutional: Negative for fever, chills and malaise/fatigue.  Respiratory: Negative for shortness of breath.   Skin: Positive for itching and rash.  Neurological: Negative for tremors and weakness.     Past Medical History  Diagnosis Date  . Hypertension 2003  . GERD (gastroesophageal reflux disease) 2003  . Chest pain   . Other seborrheic keratosis 2013  . Pain in joint, site unspecified 04/13/12  . Cramp of limb 10/21/11  . Unspecified tinnitus 04/15/11  . Personal history of fall 04/15/11  . Other seborrheic keratosis 04/13/09  . 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 Abnormality of gait September 07 2007  . Lack of coordination September 07, 2007  . Cervicalgia February 26, 2006  . Allergic rhinitis, cause unspecified 2007  . Diplopia 2006  . Other  and unspecified hyperlipidemia 2003  . Other disorders of vitreous 2003  . Unspecified hearing loss 2003  . Lumbago 2003  . Other malaise and fatigue 2003  . Disturbance of skin sensation 2003  . Dizziness and giddiness 2001  . Impotence of organic origin 1999  . Myalgia and myositis, unspecified 1999  . Dysphagia, unspecified(787.20) 1999 and  . Degeneration of intervertebral disc, site unspecified 1998  . Restless legs syndrome (RLS) 1997  . Migraine with aura, without mention of intractable migraine without mention of status migrainosus 1997  . Diverticulosis of colon (without mention of hemorrhage) 1997  . Esophagitis, unspecified 1997  . Diaphragmatic hernia without mention of obstruction or gangrene 1982    Hiatal hernia  . 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 C3-4 anterior cervical surgery  1999    Dr. Gerlene Fee  . Laminotomy / excision disk posterior cervical spine      Spinal Disk (?4-5)  . Appendectomy  Age 72  . Cardiac catheterization  2001    Dr. Aleen Campi  . Boil removed      Dr. Dwain Sarna  . Colonoscopy  07/20/07    Dr. Jarold Motto    Social History:   reports that he has quit smoking. He does not have any smokeless tobacco history on file. He reports that  drinks alcohol. He reports that he does not use illicit drugs.  No family history on file.  Medications: Patient's Medications  New Prescriptions   No medications on file  Previous Medications   ASPIRIN 325 MG EC TABLET    Take 325 mg by mouth at bedtime.     CELECOXIB (CELEBREX) 200 MG CAPSULE    Take 200 mg by mouth daily.     HYDROCODONE-ACETAMINOPHEN (NORCO/VICODIN) 5-325 MG PER TABLET    Take 1 tablet by mouth every 6 (six) hours as needed for pain. Take one tablet every 6 hours as needed to control pain   LANSOPRAZOLE (PREVACID) 30 MG CAPSULE    Take 30 mg by mouth daily. Take one tablet once a day to help acid reflux   LOSARTAN  (COZAAR) 100 MG TABLET    Take 1 tablet (100 mg total) by mouth daily.   METOPROLOL (TOPROL-XL) 100 MG 24 HR TABLET    Take 100 mg by mouth daily.     MISC NATURAL PRODUCTS (OSTEO BI-FLEX JOINT SHIELD) TABS    Take by mouth. Take one tablet twice a day for joint pains.   MOMETASONE (NASONEX) 50 MCG/ACT NASAL SPRAY    Place 2 sprays into the nose daily. 1 Puff in each nostril for allergies   MULTIPLE VITAMIN (MULTIVITAMIN) TABLET    Take 1 tablet by mouth daily.     TADALAFIL (CIALIS) 20 MG TABLET    One prior to intercourse  Modified Medications   No medications on file  Discontinued Medications   No medications on file     Physical Exam: Filed Vitals:   12/12/12 1409  BP: 134/88  Pulse: 74  Temp: 98 F (36.7 C)  TempSrc: Oral  Resp: 14  Height: 5\' 6"  (1.676 m)  Weight: 161 lb (73.029 kg)  Physical Exam  Constitutional: He is oriented to person, place, and time. He appears well-developed and well-nourished. No distress.  Cardiovascular: Normal rate, regular rhythm, normal heart sounds and intact distal pulses.   Pulmonary/Chest: Effort normal and breath sounds normal. No respiratory distress.  Musculoskeletal:  Ambulates with limp  Neurological: He is alert and oriented to person, place, and time.  dysarthric  Skin:  Both hands and feet with large vesicles present, tender and pruritic    Labs reviewed: Basic Metabolic Panel:  Recent Labs  16/10/96 0929  NA 138  K 5.1  CL 102  CO2 25  GLUCOSE 88  BUN 15  CREATININE 0.98  CALCIUM 9.3   Liver Function Tests:  Recent Labs  10/10/12 0929  AST 14  ALT 15  ALKPHOS 122*  BILITOT 0.7  PROT 5.9*  CBC:  Recent Labs  10/10/12 0929  WBC 6.5  NEUTROABS 3.6  HGB 13.7  HCT 41.1  MCV 89   Lipid Panel:  Recent Labs  10/10/12 0929  HDL 48  LDLCALC 101*  TRIG 169*  CHOLHDL 3.8   Assessment/Plan 1. Acute urticaria - fexofenadine (ALLEGRA) 180 MG tablet; Take 1 tablet (180 mg total) by mouth daily.   Dispense: 30 tablet; Refill: 0 -if not improved, would give steroid taper and refer to dermatology Next appt:  As scheduled

## 2012-12-29 ENCOUNTER — Other Ambulatory Visit: Payer: Self-pay | Admitting: Geriatric Medicine

## 2012-12-29 MED ORDER — METOPROLOL SUCCINATE ER 100 MG PO TB24: 100.0000 mg | ORAL_TABLET | Freq: Every day | ORAL | Status: DC

## 2013-01-18 ENCOUNTER — Other Ambulatory Visit: Payer: Self-pay | Admitting: Internal Medicine

## 2013-04-04 ENCOUNTER — Encounter: Payer: Self-pay | Admitting: Gastroenterology

## 2013-04-12 ENCOUNTER — Encounter: Payer: Self-pay | Admitting: Internal Medicine

## 2013-04-12 ENCOUNTER — Ambulatory Visit (INDEPENDENT_AMBULATORY_CARE_PROVIDER_SITE_OTHER): Payer: Medicare Other | Admitting: Internal Medicine

## 2013-04-12 VITALS — BP 140/78 | HR 57 | Temp 97.9°F | Resp 18 | Ht 66.0 in | Wt 157.6 lb

## 2013-04-12 DIAGNOSIS — I10 Essential (primary) hypertension: Secondary | ICD-10-CM

## 2013-04-12 DIAGNOSIS — R269 Unspecified abnormalities of gait and mobility: Secondary | ICD-10-CM

## 2013-04-12 DIAGNOSIS — G119 Hereditary ataxia, unspecified: Secondary | ICD-10-CM

## 2013-04-12 DIAGNOSIS — M545 Low back pain, unspecified: Secondary | ICD-10-CM

## 2013-04-12 DIAGNOSIS — N529 Male erectile dysfunction, unspecified: Secondary | ICD-10-CM

## 2013-04-12 DIAGNOSIS — H9319 Tinnitus, unspecified ear: Secondary | ICD-10-CM

## 2013-04-12 DIAGNOSIS — H9313 Tinnitus, bilateral: Secondary | ICD-10-CM

## 2013-04-12 DIAGNOSIS — R5381 Other malaise: Secondary | ICD-10-CM

## 2013-04-12 DIAGNOSIS — E785 Hyperlipidemia, unspecified: Secondary | ICD-10-CM

## 2013-04-12 DIAGNOSIS — R1310 Dysphagia, unspecified: Secondary | ICD-10-CM

## 2013-04-12 MED ORDER — HYDROCODONE-ACETAMINOPHEN 5-325 MG PO TABS: ORAL_TABLET | ORAL | Status: DC

## 2013-04-12 NOTE — Patient Instructions (Signed)
Continue current medications. 

## 2013-04-12 NOTE — Progress Notes (Signed)
Subjective:    Patient ID: Justin [name], male    DOB: [birth year], 72 y.o.   MRN: [id number]  Chief Complaint  Patient presents with  . Follow-up  Did not get lab prior to this visit.  HPI Headache is still a problem. Has seen his ophthalmologist.  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 Distracts him    Current Outpatient Prescriptions on File Prior to Visit  Medication Sig Dispense Refill  . aspirin 325 MG EC tablet Take 325 mg by mouth at bedtime.        . celecoxib (CELEBREX) 200 MG capsule Take 200 mg by mouth daily.        . fexofenadine (ALLEGRA) 180 MG tablet Take 1 tablet (180 mg total) by mouth daily.  30 tablet  0  . HYDROcodone-acetaminophen (NORCO/VICODIN) 5-325 MG per tablet Take 1 tablet by mouth every 6 (six) hours as needed for pain. Take one tablet every 6 hours as needed to control pain      . lansoprazole (PREVACID) 30 MG capsule TAKE 1 CAPSULE EVERY DAY TO REDUCE ACID REFLUX  30 capsule  7  . losartan (COZAAR) 100 MG tablet Take 1 tablet (100 mg total) by mouth daily.  90 tablet  3  . metoprolol succinate (TOPROL-XL) 100 MG 24 hr tablet Take 1 tablet (100 mg total) by mouth daily.  90 tablet  3  . Misc Natural Products (OSTEO BI-FLEX JOINT SHIELD) TABS Take by mouth. Take one tablet twice a day for joint pains.      . mometasone (NASONEX) 50 MCG/ACT nasal spray Place 2 sprays into the nose daily. 1 Puff in each nostril for allergies      . Multiple Vitamin (MULTIVITAMIN) tablet Take 1 tablet by mouth daily.        . tadalafil (CIALIS) 20  MG tablet One prior to intercourse  10 tablet  5   No current facility-administered medications on file prior to visit.    Review of Systems  Constitutional: Positive for activity change and fatigue. Negative for fever, chills, diaphoresis and appetite change.  HENT: Positive for hearing loss. Negative for congestion.        Tinnitus   Eyes: Positive for visual disturbance (corrective lensesw).  Respiratory: Negative for cough, choking, chest tightness and wheezing.   Cardiovascular: Negative for chest pain, palpitations and leg swelling.  Gastrointestinal: Positive for diarrhea. Negative for nausea, abdominal pain, abdominal distention and rectal pain.  Endocrine: Negative.   Genitourinary: Negative.   Musculoskeletal: Positive for arthralgias, back pain, gait problem and neck stiffness. Negative for myalgias and neck pain.  Skin: Negative.   Allergic/Immunologic: Negative.   Neurological: Positive for speech difficulty.       Familial Spinocerebellar degeneration.  Hematological: Negative.   Psychiatric/Behavioral: Negative for suicidal ideas, behavioral problems, confusion, sleep disturbance, decreased concentration and agitation. The patient is not hyperactive.        Objective:BP 140/78  Pulse 57  Temp(Src) 97.9 F (36.6 C) (Oral)  Resp 18  Ht 5\' 6"  (1.676 m)  Wt 157 lb 9.6 oz (71.487 kg)  BMI 25.45 kg/m2  SpO2 98%    Physical Exam  Constitutional: He  is oriented to person, place, and time. He appears well-developed and well-nourished. No distress.  HENT:  Head: Normocephalic.  Right Ear: External ear normal.  Left Ear: External ear normal.  Nose: Nose normal.  Mouth/Throat: Oropharynx is clear and moist.  Eyes: Conjunctivae and EOM are normal.  Neck: Neck supple. No JVD present. No tracheal deviation present. No thyromegaly present.  Cardiovascular: Normal rate, regular rhythm, normal heart sounds and intact distal pulses.  Exam reveals no gallop and no friction  rub.   No murmur heard. Pulmonary/Chest: No respiratory distress. He has no wheezes. He has no rales. He exhibits no tenderness.  Abdominal: He exhibits no distension and no mass. There is no tenderness.  Musculoskeletal: He exhibits no edema and no tenderness.  Gait disturbance. Using a cane.  Lymphadenopathy:    He has no cervical adenopathy.  Neurological: He is alert and oriented to person, place, and time. He has normal reflexes. No cranial nerve deficit. Coordination abnormal.  Ataxic.Dysphagia. Poor balance.  Skin: No rash noted. No erythema. No pallor.  Psychiatric: He has a normal mood and affect. His behavior is normal. Judgment and thought content normal.          Assessment & Plan:  Other malaise and fatigue: chronnic and likely related to spinocerebellar degeneeration - Plan: CBC with Differential, TSH  Other and unspecified hyperlipidemia - Plan: Lipid Panel  Impotence, organic - Plan: Testosterone  Hypertension: controlled - Plan: CMP  Abnormality of gait:unchanged  Dysphagia, unspecified(787.20): no recent coughing episodes or pneumonia. Has lost weight, but he feels like he is getting enough to eat.  Spinocerebellar disease: chronic and slowly deteriorating.  Lumbago - Plan: HYDROcodone-acetaminophen (NORCO/VICODIN) 5-325 MG per tablet  Tinnitus, bilateral: given educational materials

## 2013-04-13 LAB — LIPID PANEL
Cholesterol, Total: 200 mg/dL — ABNORMAL HIGH (ref 100–199)
HDL: 43 mg/dL (ref >39)
LDL Calculated: 99 mg/dL (ref 0–99)
Triglycerides: 290 mg/dL — ABNORMAL HIGH (ref 0–149)
VLDL Cholesterol Cal: 58 mg/dL — ABNORMAL HIGH (ref 5–40)

## 2013-04-13 LAB — CBC WITH DIFFERENTIAL/PLATELET
Basos: 0 %
Eos: 2 %
Eosinophils Absolute: 0.2 10*3/uL (ref 0.0–0.4)
Hemoglobin: 14.3 g/dL (ref 12.6–17.7)
Lymphs: 28 %
MCH: 30 pg (ref 26.6–33.0)
Monocytes: 10 %
Neutrophils Absolute: 4.1 10*3/uL (ref 1.4–7.0)
RBC: 4.76 x10E6/uL (ref 4.14–5.80)

## 2013-04-13 LAB — COMPREHENSIVE METABOLIC PANEL
AST: 19 IU/L (ref 0–40)
Albumin/Globulin Ratio: 2 (ref 1.1–2.5)
Alkaline Phosphatase: 124 IU/L — ABNORMAL HIGH (ref 39–117)
BUN/Creatinine Ratio: 19 (ref 10–22)
Creatinine, Ser: 0.97 mg/dL (ref 0.76–1.27)
GFR calc Af Amer: 90 mL/min/{1.73_m2} (ref >59)
GFR calc non Af Amer: 78 mL/min/{1.73_m2} (ref >59)
Globulin, Total: 2.1 g/dL (ref 1.5–4.5)
Sodium: 141 mmol/L (ref 134–144)

## 2013-04-14 ENCOUNTER — Other Ambulatory Visit: Payer: Medicare Other

## 2013-04-25 ENCOUNTER — Encounter: Payer: Self-pay | Admitting: Internal Medicine

## 2013-05-29 ENCOUNTER — Other Ambulatory Visit: Payer: Self-pay | Admitting: Internal Medicine

## 2013-06-01 HISTORY — PX: EYE SURGERY: SHX253

## 2013-09-10 ENCOUNTER — Other Ambulatory Visit: Payer: Self-pay | Admitting: Internal Medicine

## 2013-10-11 ENCOUNTER — Encounter: Payer: Self-pay | Admitting: Internal Medicine

## 2013-10-11 ENCOUNTER — Other Ambulatory Visit: Payer: Self-pay | Admitting: Internal Medicine

## 2013-10-11 ENCOUNTER — Ambulatory Visit (INDEPENDENT_AMBULATORY_CARE_PROVIDER_SITE_OTHER): Payer: Medicare Other | Admitting: Internal Medicine

## 2013-10-11 VITALS — BP 128/70 | HR 55 | Temp 97.5°F | Resp 18 | Ht 66.0 in | Wt 156.4 lb

## 2013-10-11 DIAGNOSIS — M545 Low back pain, unspecified: Secondary | ICD-10-CM

## 2013-10-11 DIAGNOSIS — R269 Unspecified abnormalities of gait and mobility: Secondary | ICD-10-CM

## 2013-10-11 DIAGNOSIS — Z23 Encounter for immunization: Secondary | ICD-10-CM

## 2013-10-11 DIAGNOSIS — G119 Hereditary ataxia, unspecified: Secondary | ICD-10-CM

## 2013-10-11 DIAGNOSIS — R1310 Dysphagia, unspecified: Secondary | ICD-10-CM

## 2013-10-11 DIAGNOSIS — I10 Essential (primary) hypertension: Secondary | ICD-10-CM

## 2013-10-11 DIAGNOSIS — E785 Hyperlipidemia, unspecified: Secondary | ICD-10-CM

## 2013-10-11 NOTE — Progress Notes (Signed)
Patient ID: Justin [name], male   DOB: [birth year], 73 y.o.   MRN: [id number]    Location:    [facility]  Place of Service:  OFFICE  PCP: Estill Dooms, MD  Code Status: LIVING WILL  Extended Emergency Contact Information Primary Emergency Contact: [place] Phone: [phone] Relation: None  Allergies  Allergen Reactions  . Aciphex [Rabeprazole Sodium]   . Aleve [Naproxen Sodium]   . Effexor [Venlafaxine Hcl]   . Serzone [Nefazodone]   . Vivactil [Protriptyline Hcl]     Chief Complaint  Patient presents with  . Follow-up    HPI:  Spinocerebellar disease: unchanged. Multiple deficits. See ROS  Dysphagia, unspecified(787.20): increasing difficulties  Lumbago: unchanged  Abnormality of gait: increasing difficulty. Using cane. Thinking about using walker.  Hypertension; controlled      Past Medical History  Diagnosis Date  . Hypertension 2003  . GERD (gastroesophageal reflux disease) 2003  . Chest pain   . Other seborrheic keratosis 2013  . Pain in joint, site unspecified 04/13/12  . Cramp of limb 10/21/11  . Unspecified tinnitus 04/15/11  . Personal history of fall 04/15/11  . Other seborrheic keratosis 04/13/09  . 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 Abnormality of gait September 07 2007  . Lack of coordination September 07, 2007  . Cervicalgia February 26, 2006  . Allergic rhinitis, cause unspecified 2007  . Diplopia 2006  . Other and unspecified hyperlipidemia 2003  . Other disorders of vitreous 2003  . Unspecified hearing loss 2003  . Lumbago 2003  . Other malaise and fatigue 2003  . Disturbance of skin sensation 2003  . Dizziness and giddiness 2001  . Impotence of organic origin 1999  . Myalgia and myositis, unspecified 1999  . Dysphagia, unspecified(787.20) 1999 and  . Degeneration of intervertebral disc, site unspecified 1998  . Restless legs syndrome (RLS) 1997  . Migraine with aura, without mention of  intractable migraine without mention of status migrainosus 1997  . Diverticulosis of colon (without mention of hemorrhage) 1997  . Esophagitis, unspecified 1997  . Diaphragmatic hernia without mention of obstruction or gangrene 1982    Hiatal hernia  . 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 C3-4 anterior cervical surgery  1999    Dr. Hal Neer  . Laminotomy / excision disk posterior cervical spine      Spinal Disk (?4-5)  . Appendectomy  Age 53  . Cardiac catheterization  2001    Dr. Glade Lloyd  . Boil removed      Dr. Donne Hazel  . Colonoscopy  07/20/07    Dr. Sharlett Iles  . Eye surgery Bilateral 2015    cataracts   Social History: History   Social History  . Marital Status: Married    Spouse Name: N/A    Number of Children: N/A  . Years of Education: N/A   Social History Main Topics  . Smoking status: Former Research scientist (life sciences)  . Smokeless tobacco: None  . Alcohol Use: Yes     Comment: Occas. Wine   . Drug Use: No  . Sexual Activity: None   Other Topics Concern  . None   Social History Narrative  . None    Family History Family Status  Relation Status Death Age  . Mother Alive     Heart Valve Replacement, Tumor removed from brain, Hysterectomy, Disk problem in Lower back  . Father Deceased 57?    spinocerebellar degeneration;  Heart Attack  . Daughter Alive   . Son Alive   . Paternal Aunt      spinocerebellar degeneration  . Paternal Uncle      Spinocerebellar degeneration   History reviewed. No pertinent family history.   Medications: Patient's Medications  New Prescriptions   No medications on file  Previous Medications   ASPIRIN 325 MG EC TABLET    Take 325 mg by mouth at bedtime.     CELECOXIB (CELEBREX) 200 MG CAPSULE    TAKE ONE CAPSULE BY MOUTH EVERY DAY TO HELP WITH ARTHRITIS PAINS   FEXOFENADINE (ALLEGRA) 180 MG TABLET    Take 1 tablet (180 mg total) by mouth daily.    HYDROCODONE-ACETAMINOPHEN (NORCO/VICODIN) 5-325 MG PER TABLET    Take one tablet every 6 hours as needed to control pain   LANSOPRAZOLE (PREVACID) 30 MG CAPSULE    TAKE 1 CAPSULE EVERY DAY TO REDUCE ACID REFLUX   LOSARTAN (COZAAR) 100 MG TABLET    Take 1 tablet (100 mg total) by mouth daily.   METOPROLOL SUCCINATE (TOPROL-XL) 100 MG 24 HR TABLET    Take 1 tablet (100 mg total) by mouth daily.   MISC NATURAL PRODUCTS (OSTEO BI-FLEX JOINT SHIELD) TABS    Take by mouth. Take one tablet twice a day for joint pains.   MOMETASONE (NASONEX) 50 MCG/ACT NASAL SPRAY    Place 2 sprays into the nose daily. 1 Puff in each nostril for allergies   MULTIPLE VITAMIN (MULTIVITAMIN) TABLET    Take 1 tablet by mouth daily.     TADALAFIL (CIALIS) 20 MG TABLET    One prior to intercourse  Modified Medications   No medications on file  Discontinued Medications   No medications on file    Immunization History  Administered Date(s) Administered  . DTaP 06/12/2011  . Influenza Whole 03/01/2013  . Pneumococcal Polysaccharide-23 03/01/2005  . Zoster 10/15/2010     Review of Systems  Constitutional: Positive for activity change and fatigue. Negative for fever, chills, diaphoresis and appetite change.  HENT: Positive for hearing loss. Negative for congestion.        Tinnitus.   Eyes: Positive for visual disturbance (corrective lenses).  Respiratory: Negative for cough, choking, chest tightness and wheezing.   Cardiovascular: Negative for chest pain, palpitations and leg swelling.  Gastrointestinal: Positive for diarrhea. Negative for nausea, abdominal pain, abdominal distention and rectal pain.       Dysphagia, esp crackers and liquids.  Endocrine: Negative.   Genitourinary: Negative.   Musculoskeletal: Positive for arthralgias, back pain, gait problem (using cane) and neck stiffness. Negative for myalgias and neck pain.  Skin: Negative.   Allergic/Immunologic: Negative.   Neurological: Positive for speech  difficulty.       Familial Spinocerebellar degeneration.  Hematological: Negative.   Psychiatric/Behavioral: Negative for suicidal ideas, behavioral problems, confusion, sleep disturbance, decreased concentration and agitation. The patient is not hyperactive.       Filed Vitals:   10/11/13 1202  BP: 128/70  Pulse: 55  Temp: 97.5 F (36.4 C)  TempSrc: Oral  Resp: 18  Height: 5\' 6"  (1.676 m)  Weight: 156 lb 6.4 oz (70.943 kg)  SpO2: 98%  Body mass index is 25.26 kg/(m^2).  Physical Exam  Constitutional: He is oriented to person, place, and time. He appears well-developed and well-nourished. No distress.  HENT:  Head: Normocephalic.  Right Ear: External ear normal.  Left Ear: External ear normal.  Nose: Nose normal.  Mouth/Throat: Oropharynx is clear  and moist.  Eyes: Conjunctivae and EOM are normal.  Neck: Neck supple. No JVD present. No tracheal deviation present. No thyromegaly present.  Dysphonia and "scanning" speech.  Cardiovascular: Normal rate, regular rhythm, normal heart sounds and intact distal pulses.  Exam reveals no gallop and no friction rub.   No murmur heard. Pulmonary/Chest: No respiratory distress. He has no wheezes. He has no rales. He exhibits no tenderness.  Abdominal: He exhibits no distension and no mass. There is no tenderness.  Musculoskeletal: He exhibits no edema and no tenderness.  Gait disturbance. Using a cane.  Lymphadenopathy:    He has no cervical adenopathy.  Neurological: He is alert and oriented to person, place, and time. He has normal reflexes. No cranial nerve deficit. Coordination abnormal.  Ataxic.Dysphagia. Dysphonia. Poor balance.  Skin: No rash noted. No erythema. No pallor.  Psychiatric: He has a normal mood and affect. His behavior is normal. Judgment and thought content normal.        Labs reviewed: No visits with results within 3 Month(s) from this visit. Latest known visit with results is:  Office Visit on 04/12/2013   Component Date Value Ref Range Status  . WBC 04/12/2013 6.9  3.4 - 10.8 x10E3/uL Final  . RBC 04/12/2013 4.76  4.14 - 5.80 x10E6/uL Final  . Hemoglobin 04/12/2013 14.3  12.6 - 17.7 g/dL Final  . HCT 04/12/2013 42.1  37.5 - 51.0 % Final  . MCV 04/12/2013 88  79 - 97 fL Final  . MCH 04/12/2013 30.0  26.6 - 33.0 pg Final  . MCHC 04/12/2013 34.0  31.5 - 35.7 g/dL Final  . RDW 04/12/2013 13.6  12.3 - 15.4 % Final  . Neutrophils Relative % 04/12/2013 60   Final  . Lymphs 04/12/2013 28   Final  . Monocytes 04/12/2013 10   Final  . Eos 04/12/2013 2   Final  . Basos 04/12/2013 0   Final  . Neutrophils Absolute 04/12/2013 4.1  1.4 - 7.0 x10E3/uL Final  . Lymphocytes Absolute 04/12/2013 1.9  0.7 - 3.1 x10E3/uL Final  . Monocytes Absolute 04/12/2013 0.7  0.1 - 0.9 x10E3/uL Final  . Eosinophils Absolute 04/12/2013 0.2  0.0 - 0.4 x10E3/uL Final  . Basophils Absolute 04/12/2013 0.0  0.0 - 0.2 x10E3/uL Final  . Immature Granulocytes 04/12/2013 0   Final  . Immature Grans (Abs) 04/12/2013 0.0  0.0 - 0.1 x10E3/uL Final  . Cholesterol, Total 04/12/2013 200* 100 - 199 mg/dL Final  . Triglycerides 04/12/2013 290* 0 - 149 mg/dL Final  . HDL 04/12/2013 43  >39 mg/dL Final   Comment: According to ATP-III Guidelines, HDL-C >59 mg/dL is considered a                          negative risk factor for CHD.  Marland Kitchen VLDL Cholesterol Cal 04/12/2013 58* 5 - 40 mg/dL Final  . LDL Calculated 04/12/2013 99  0 - 99 mg/dL Final  . Chol/HDL Ratio 04/12/2013 4.7  0.0 - 5.0 ratio units Final   Comment:                                   T. Chol/HDL Ratio  Men  Women                                                        1/2 Avg.Risk  3.4    3.3                                                            Avg.Risk  5.0    4.4                                                         2X Avg.Risk  9.6    7.1                                                         3X  Avg.Risk 23.4   11.0  . Testosterone 04/12/2013 459  348 - 1197 ng/dL Final  . COMMENT 04/12/2013 Comment   Final   Comment: Adult male reference interval is based on a population of lean males                          up to 73 years old.  Marland Kitchen TSH 04/12/2013 1.790  0.450 - 4.500 uIU/mL Final  . Glucose 04/12/2013 89  65 - 99 mg/dL Final  . BUN 04/12/2013 18  8 - 27 mg/dL Final  . Creatinine, Ser 04/12/2013 0.97  0.76 - 1.27 mg/dL Final  . GFR calc non Af Amer 04/12/2013 78  >59 mL/min/1.73 Final  . GFR calc Af Amer 04/12/2013 90  >59 mL/min/1.73 Final  . BUN/Creatinine Ratio 04/12/2013 19  10 - 22 Final  . Sodium 04/12/2013 141  134 - 144 mmol/L Final  . Potassium 04/12/2013 4.8  3.5 - 5.2 mmol/L Final  . Chloride 04/12/2013 102  97 - 108 mmol/L Final  . CO2 04/12/2013 23  18 - 29 mmol/L Final  . Calcium 04/12/2013 9.4  8.6 - 10.2 mg/dL Final  . Total Protein 04/12/2013 6.3  6.0 - 8.5 g/dL Final  . Albumin 04/12/2013 4.2  3.5 - 4.8 g/dL Final  . Globulin, Total 04/12/2013 2.1  1.5 - 4.5 g/dL Final  . Albumin/Globulin Ratio 04/12/2013 2.0  1.1 - 2.5 Final  . Total Bilirubin 04/12/2013 0.4  0.0 - 1.2 mg/dL Final  . Alkaline Phosphatase 04/12/2013 124* 39 - 117 IU/L Final  . AST 04/12/2013 19  0 - 40 IU/L Final  . ALT 04/12/2013 14  0 - 44 IU/L Final     Assessment/Plan  1. Spinocerebellar disease Slowly progressing.  2. Dysphagia, unspecified(787.20) A little worse than in the past. Patient does not feel that he needs further evaluation for speech therapy at this time  3. Lumbago Chronic and unchanged  4. Abnormality of gait More unstable  with walking. Patient is close to deciding that he needs a walker. I recommended four-wheel with brakes and seat. He will let me know he wants a prescription  5. Hypertension Controlled - Basic metabolic panel; Future  6. Other and unspecified hyperlipidemia Controlled - Lipid panel; Future

## 2013-10-11 NOTE — Patient Instructions (Signed)
Continue current medicatoins

## 2013-11-12 ENCOUNTER — Other Ambulatory Visit: Payer: Self-pay | Admitting: Internal Medicine

## 2013-12-06 ENCOUNTER — Other Ambulatory Visit: Payer: Self-pay | Admitting: Internal Medicine

## 2014-01-03 ENCOUNTER — Other Ambulatory Visit: Payer: Self-pay | Admitting: Nurse Practitioner

## 2014-02-19 ENCOUNTER — Other Ambulatory Visit: Payer: Medicare Other

## 2014-02-19 DIAGNOSIS — E785 Hyperlipidemia, unspecified: Secondary | ICD-10-CM

## 2014-02-19 DIAGNOSIS — I10 Essential (primary) hypertension: Secondary | ICD-10-CM

## 2014-02-20 LAB — LIPID PANEL
CHOL/HDL RATIO: 3.9 ratio (ref 0.0–5.0)
Cholesterol, Total: 208 mg/dL — ABNORMAL HIGH (ref 100–199)
HDL: 54 mg/dL (ref >39)
LDL CALC: 125 mg/dL — AB (ref 0–99)
Triglycerides: 144 mg/dL (ref 0–149)
VLDL Cholesterol Cal: 29 mg/dL (ref 5–40)

## 2014-02-20 LAB — BASIC METABOLIC PANEL
BUN/Creatinine Ratio: 16 (ref 10–22)
BUN: 15 mg/dL (ref 8–27)
CHLORIDE: 102 mmol/L (ref 97–108)
CO2: 24 mmol/L (ref 18–29)
Calcium: 9.2 mg/dL (ref 8.6–10.2)
Creatinine, Ser: 0.94 mg/dL (ref 0.76–1.27)
GFR calc non Af Amer: 81 mL/min/{1.73_m2} (ref >59)
GFR, EST AFRICAN AMERICAN: 93 mL/min/{1.73_m2} (ref >59)
Glucose: 98 mg/dL (ref 65–99)
POTASSIUM: 4.3 mmol/L (ref 3.5–5.2)
Sodium: 141 mmol/L (ref 134–144)

## 2014-02-21 ENCOUNTER — Encounter: Payer: Self-pay | Admitting: Internal Medicine

## 2014-02-21 ENCOUNTER — Ambulatory Visit (INDEPENDENT_AMBULATORY_CARE_PROVIDER_SITE_OTHER): Payer: Medicare Other | Admitting: Internal Medicine

## 2014-02-21 VITALS — BP 122/68 | HR 60 | Temp 97.7°F | Resp 18 | Ht 66.0 in | Wt 161.4 lb

## 2014-02-21 DIAGNOSIS — M1990 Unspecified osteoarthritis, unspecified site: Secondary | ICD-10-CM | POA: Insufficient documentation

## 2014-02-21 DIAGNOSIS — Z23 Encounter for immunization: Secondary | ICD-10-CM

## 2014-02-21 DIAGNOSIS — I10 Essential (primary) hypertension: Secondary | ICD-10-CM

## 2014-02-21 DIAGNOSIS — M545 Low back pain, unspecified: Secondary | ICD-10-CM

## 2014-02-21 DIAGNOSIS — R1310 Dysphagia, unspecified: Secondary | ICD-10-CM

## 2014-02-21 DIAGNOSIS — R471 Dysarthria and anarthria: Secondary | ICD-10-CM

## 2014-02-21 DIAGNOSIS — R269 Unspecified abnormalities of gait and mobility: Secondary | ICD-10-CM

## 2014-02-21 DIAGNOSIS — R29818 Other symptoms and signs involving the nervous system: Secondary | ICD-10-CM | POA: Insufficient documentation

## 2014-02-21 DIAGNOSIS — R2689 Other abnormalities of gait and mobility: Secondary | ICD-10-CM | POA: Insufficient documentation

## 2014-02-21 DIAGNOSIS — G119 Hereditary ataxia, unspecified: Secondary | ICD-10-CM

## 2014-02-21 DIAGNOSIS — L821 Other seborrheic keratosis: Secondary | ICD-10-CM

## 2014-02-21 HISTORY — DX: Other symptoms and signs involving the nervous system: R29.818

## 2014-02-21 LAB — BASIC METABOLIC PANEL

## 2014-02-21 LAB — LIPID PANEL

## 2014-02-21 MED ORDER — CELECOXIB 200 MG PO CAPS: ORAL_CAPSULE | ORAL | Status: DC

## 2014-02-21 MED ORDER — HYDROCODONE-ACETAMINOPHEN 10-325 MG PO TABS: ORAL_TABLET | ORAL | Status: DC

## 2014-02-21 NOTE — Progress Notes (Deleted)
Patient ID: Justin [name], male   DOB: 09-10-1940, 73 y.o.   MRN: [id number]    Location:      Place of Service:    Extended Emergency Contact Information Primary Emergency Contact: [place] Phone: [phone] Relation: None   No chief complaint on file.   HPI:  ***  Past Medical History  Diagnosis Date  . Hypertension 2003  . GERD (gastroesophageal reflux disease) 2003  . Chest pain   . Other seborrheic keratosis 2013  . Pain in joint, site unspecified 04/13/12  . Cramp of limb 10/21/11  . Unspecified tinnitus 04/15/11  . Personal history of fall 04/15/11  . Other seborrheic keratosis 04/13/09  . 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 Abnormality of gait September 07 2007  . Lack of coordination September 07, 2007  . Cervicalgia February 26, 2006  . Allergic rhinitis, cause unspecified 2007  . Diplopia 2006  . Other and unspecified hyperlipidemia 2003  . Other disorders of vitreous 2003  . Unspecified hearing loss 2003  . Lumbago 2003  . Other malaise and fatigue 2003  . Disturbance of skin sensation 2003  . Dizziness and giddiness 2001  . Impotence of organic origin 1999  . Myalgia and myositis, unspecified 1999  . Dysphagia, unspecified(787.20) 1999 and  . Degeneration of intervertebral disc, site unspecified 1998  . Restless legs syndrome (RLS) 1997  . Migraine with aura, without mention of intractable migraine without mention of status migrainosus 1997  . Diverticulosis of colon (without mention of hemorrhage) 1997  . Esophagitis, unspecified 1997  . Diaphragmatic hernia without mention of obstruction or gangrene 1982    Hiatal hernia  . 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 C3-4 anterior cervical surgery  1999    Dr. Hal Neer  . Laminotomy / excision disk posterior cervical spine      Spinal Disk (?4-5)  . Appendectomy  Age 87  . Cardiac  catheterization  2001    Dr. Glade Lloyd  . Boil removed      Dr. Donne Hazel  . Colonoscopy  07/20/07    Dr. Sharlett Iles  . Eye surgery Bilateral 2015    cataracts    History   Social History  . Marital Status: Married    Spouse Name: N/A    Number of Children: N/A  . Years of Education: N/A   Occupational History  . Not on file.   Social History Main Topics  . Smoking status: Former Research scientist (life sciences)  . Smokeless tobacco: Not on file  . Alcohol Use: Yes     Comment: Occas. Wine   . Drug Use: No  . Sexual Activity: Not on file   Other Topics Concern  . Not on file   Social History Narrative  . No narrative on file     reports that he has quit smoking. He does not have any smokeless tobacco history on file. He reports that he drinks alcohol. He reports that he does not use illicit drugs.  Immunization History  Administered Date(s) Administered  . DTaP 06/12/2011  . Influenza Whole 03/01/2013  . Pneumococcal Conjugate-13 10/11/2013  . Pneumococcal Polysaccharide-23 03/01/2005  . Zoster 10/15/2010    Allergies  Allergen Reactions  . Aciphex [Rabeprazole Sodium]   . Aleve [Naproxen Sodium]   . Effexor [Venlafaxine Hcl]   . Serzone [Nefazodone]   . Vivactil [Protriptyline Hcl]     Medications: Patient's Medications  New Prescriptions  No medications on file  Previous Medications   ASPIRIN 325 MG EC TABLET    Take 325 mg by mouth at bedtime.     CELECOXIB (CELEBREX) 200 MG CAPSULE    TAKE ONE CAPSULE BY MOUTH EVERY DAY TO HELP WITH ARTHRITIS PAINS   FEXOFENADINE (ALLEGRA) 180 MG TABLET    Take 1 tablet (180 mg total) by mouth daily.   HYDROCODONE-ACETAMINOPHEN (NORCO/VICODIN) 5-325 MG PER TABLET    Take one tablet every 6 hours as needed to control pain   LANSOPRAZOLE (PREVACID) 30 MG CAPSULE    TAKE 1 CAPSULE EVERY DAY TO REDUCE ACID REFLUX   LOSARTAN (COZAAR) 100 MG TABLET    TAKE 1 TABLET (100 MG TOTAL) BY MOUTH DAILY.   METOPROLOL SUCCINATE (TOPROL-XL) 100 MG 24 HR  TABLET    TAKE 1 TABLET (100 MG TOTAL) BY MOUTH DAILY.   MISC NATURAL PRODUCTS (OSTEO BI-FLEX JOINT SHIELD) TABS    Take by mouth. Take one tablet twice a day for joint pains.   MOMETASONE (NASONEX) 50 MCG/ACT NASAL SPRAY    Place 2 sprays into the nose daily. 1 Puff in each nostril for allergies   MULTIPLE VITAMIN (MULTIVITAMIN) TABLET    Take 1 tablet by mouth daily.     TADALAFIL (CIALIS) 20 MG TABLET    One prior to intercourse  Modified Medications   No medications on file  Discontinued Medications   No medications on file     Review of Systems  Constitutional: Positive for activity change and fatigue. Negative for fever, chills, diaphoresis and appetite change.  HENT: Positive for hearing loss. Negative for congestion.           Eyes: Positive for visual disturbance (corrective lenses).  Respiratory: Negative for cough, choking, chest tightness and wheezing.   Cardiovascular: Negative for chest pain, palpitations and leg swelling.  Gastrointestinal: Negative for nausea, abdominal pain, diarrhea, abdominal distention and rectal pain.          Endocrine: Negative.   Genitourinary: Negative.   Musculoskeletal: Positive for arthralgias, back pain, gait problem (using cane) and neck stiffness. Negative for myalgias and neck pain.  Skin: Negative.   Allergic/Immunologic: Negative.   Neurological: Positive for speech difficulty.       Familial Spinocerebellar degeneration.  Hematological: Negative.   Psychiatric/Behavioral: Negative for suicidal ideas, behavioral problems, confusion, sleep disturbance, decreased concentration and agitation. The patient is not hyperactive.     There were no vitals filed for this visit. There is no weight on file to calculate BMI.  Physical Exam   Labs reviewed: Appointment on 02/19/2014  Component Date Value Ref Range Status  . Glucose 02/19/2014 98  65 - 99 mg/dL Final  . BUN 02/19/2014 15  8 - 27 mg/dL Final  . Creatinine, Ser 02/19/2014  0.94  0.76 - 1.27 mg/dL Final  . GFR calc non Af Amer 02/19/2014 81  >59 mL/min/1.73 Final  . GFR calc Af Amer 02/19/2014 93  >59 mL/min/1.73 Final  . BUN/Creatinine Ratio 02/19/2014 16  10 - 22 Final  . Sodium 02/19/2014 141  134 - 144 mmol/L Final  . Potassium 02/19/2014 4.3  3.5 - 5.2 mmol/L Final  . Chloride 02/19/2014 102  97 - 108 mmol/L Final  . CO2 02/19/2014 24  18 - 29 mmol/L Final  . Calcium 02/19/2014 9.2  8.6 - 10.2 mg/dL Final  . Cholesterol, Total 02/19/2014 208* 100 - 199 mg/dL Final  . Triglycerides 02/19/2014 144  0 - 149 mg/dL  Final  . HDL 02/19/2014 54  >39 mg/dL Final   Comment: According to ATP-III Guidelines, HDL-C >59 mg/dL is considered a                          negative risk factor for CHD.  Marland Kitchen VLDL Cholesterol Cal 02/19/2014 29  5 - 40 mg/dL Final  . LDL Calculated 02/19/2014 125* 0 - 99 mg/dL Final  . Chol/HDL Ratio 02/19/2014 3.9  0.0 - 5.0 ratio units Final   Comment:                                   T. Chol/HDL Ratio                                                                      Men  Women                                                        1/2 Avg.Risk  3.4    3.3                                                            Avg.Risk  5.0    4.4                                                         2X Avg.Risk  9.6    7.1                                                         3X Avg.Risk 23.4   11.0      Assessment/Plan

## 2014-02-21 NOTE — Progress Notes (Signed)
Patient ID: Justin [name], male   DOB: [birth year], 73 y.o.   MRN: [id number]    Location: [facility]    Place of Service: OFFICE     Allergies  Allergen Reactions  . Aciphex [Rabeprazole Sodium]   . Aleve [Naproxen Sodium]   . Effexor [Venlafaxine Hcl]   . Serzone [Nefazodone]   . Vivactil [Protriptyline Hcl]     Chief Complaint  Patient presents with  . Medical Management of Chronic Issues    HPI:  Need for prophylactic vaccination and inoculation against influenza  Essential hypertension: controlled  Abnormality of gait: getting worse. Using cane and walks behind a rolling chair.  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 Denies much cough at meals  Dysarthria: unchanged    Medications: Patient's Medications  New Prescriptions   No medications on file  Previous Medications   ASPIRIN 325 MG EC TABLET    Take 325 mg by mouth at bedtime.     CELECOXIB (CELEBREX) 200 MG CAPSULE    TAKE ONE CAPSULE BY MOUTH EVERY DAY TO HELP WITH ARTHRITIS PAINS   FEXOFENADINE (ALLEGRA) 180 MG TABLET    Take 1 tablet (180 mg total) by mouth daily.   HYDROCODONE-ACETAMINOPHEN (NORCO/VICODIN) 5-325 MG PER TABLET    Take one tablet every 6 hours as needed to control pain   LANSOPRAZOLE (PREVACID) 30 MG CAPSULE    TAKE 1 CAPSULE EVERY DAY TO REDUCE ACID REFLUX   LOSARTAN (COZAAR) 100 MG TABLET    TAKE 1 TABLET (100 MG TOTAL) BY MOUTH DAILY.   METOPROLOL SUCCINATE (TOPROL-XL) 100 MG 24 HR TABLET    TAKE 1 TABLET (100 MG TOTAL) BY MOUTH DAILY.   MISC NATURAL PRODUCTS (OSTEO BI-FLEX JOINT SHIELD) TABS    Take by mouth. Take one tablet twice a day for joint pains.   MOMETASONE (NASONEX) 50 MCG/ACT NASAL SPRAY    Place 2 sprays into the nose daily. 1 Puff in each nostril for allergies   MULTIPLE VITAMIN (MULTIVITAMIN) TABLET    Take 1 tablet by mouth daily.     TADALAFIL (CIALIS) 20 MG  TABLET    One prior to intercourse  Modified Medications   No medications on file  Discontinued Medications   No medications on file     Review of Systems  Constitutional: Positive for activity change and fatigue. Negative for fever, chills, diaphoresis and appetite change.  HENT: Positive for hearing loss. Negative for congestion.        Tinnitus.   Eyes: Positive for visual disturbance (corrective lenses).  Respiratory: Negative for cough, choking, chest tightness and wheezing.   Cardiovascular: Negative for chest pain, palpitations and leg swelling.  Gastrointestinal: Positive for diarrhea. Negative for nausea, abdominal pain, abdominal distention and rectal pain.       Dysphagia, esp crackers and liquids.  Endocrine: Negative.   Genitourinary: Negative.   Musculoskeletal: Positive for arthralgias, back pain, gait problem (using cane) and neck stiffness. Negative for myalgias and neck pain.  Skin: Negative.   Allergic/Immunologic: Negative.   Neurological: Positive for speech difficulty.       Familial Spinocerebellar degeneration.  Hematological: Negative.   Psychiatric/Behavioral: Negative for suicidal ideas, behavioral problems, confusion, sleep disturbance, decreased concentration and agitation. The patient is not hyperactive.     Filed Vitals:   02/21/14 1527  BP: 122/68  Pulse: 60  Temp: 97.7 F (36.5 C)  TempSrc: Oral  Resp: 18  Height: 5\' 6"  (1.676 m)  Weight: 161 lb 6.4 oz (73.211 kg)  SpO2: 97%   Body mass index is 26.06 kg/(m^2).  Physical Exam  Constitutional: He is oriented to person, place, and time. He appears well-developed and well-nourished. No distress.  HENT:  Head: Normocephalic.  Right Ear: External ear normal.  Left Ear: External ear normal.  Nose: Nose normal.  Mouth/Throat: Oropharynx is clear and moist.  Eyes: Conjunctivae and EOM are normal.  Neck: Neck supple. No JVD present. No tracheal deviation present. No thyromegaly present.    Dysphonia and "scanning" speech.  Cardiovascular: Normal rate, regular rhythm, normal heart sounds and intact distal pulses.  Exam reveals no gallop and no friction rub.   No murmur heard. Pulmonary/Chest: No respiratory distress. He has no wheezes. He has no rales. He exhibits no tenderness.  Abdominal: He exhibits no distension and no mass. There is no tenderness.  Musculoskeletal: He exhibits no edema and no tenderness.  Gait disturbance. Using a cane.  Lymphadenopathy:    He has no cervical adenopathy.  Neurological: He is alert and oriented to person, place, and time. He has normal reflexes. No cranial nerve deficit. Coordination abnormal.  Ataxic.Dysphagia. Dysphonia. Poor balance.  Skin: No rash noted. No erythema. No pallor.  Psychiatric: He has a normal mood and affect. His behavior is normal. Judgment and thought content normal.     Labs reviewed: Appointment on 02/19/2014  Component Date Value Ref Range Status  . Glucose 02/19/2014 98  65 - 99 mg/dL Final  . BUN 02/19/2014 15  8 - 27 mg/dL Final  . Creatinine, Ser 02/19/2014 0.94  0.76 - 1.27 mg/dL Final  . GFR calc non Af Amer 02/19/2014 81  >59 mL/min/1.73 Final  . GFR calc Af Amer 02/19/2014 93  >59 mL/min/1.73 Final  . BUN/Creatinine Ratio 02/19/2014 16  10 - 22 Final  . Sodium 02/19/2014 141  134 - 144 mmol/L Final  . Potassium 02/19/2014 4.3  3.5 - 5.2 mmol/L Final  . Chloride 02/19/2014 102  97 - 108 mmol/L Final  . CO2 02/19/2014 24  18 - 29 mmol/L Final  . Calcium 02/19/2014 9.2  8.6 - 10.2 mg/dL Final  . Cholesterol, Total 02/19/2014 208* 100 - 199 mg/dL Final  . Triglycerides 02/19/2014 144  0 - 149 mg/dL Final  . HDL 02/19/2014 54  >39 mg/dL Final   Comment: According to ATP-III Guidelines, HDL-C >59 mg/dL is considered a                          negative risk factor for CHD.  Marland Kitchen VLDL Cholesterol Cal 02/19/2014 29  5 - 40 mg/dL Final  . LDL Calculated 02/19/2014 125* 0 - 99 mg/dL Final  . Chol/HDL Ratio  02/19/2014 3.9  0.0 - 5.0 ratio units Final   Comment:                                   T. Chol/HDL Ratio                                                                      Men  Women  1/2 Avg.Risk  3.4    3.3                                                            Avg.Risk  5.0    4.4                                                         2X Avg.Risk  9.6    7.1                                                         3X Avg.Risk 23.4   11.0     Assessment/Plan 1. Need for prophylactic vaccination and inoculation against influenza  2. Essential hypertension controlled  3. Abnormality of gait worsening  4. Spinocerebellar disease Unchanged. Slow progression.  5. Midline low back pain without sciatica unchanged  6. Dysphagia, unspecified(787.20) unchanged  7. Dysarthria Unchanged.  8. Neurodegenerative disorder -B12

## 2014-02-22 LAB — SPECIMEN STATUS REPORT

## 2014-02-22 LAB — VITAMIN B12: Vitamin B-12: 246 pg/mL (ref 211–946)

## 2014-06-08 ENCOUNTER — Other Ambulatory Visit: Payer: Self-pay | Admitting: Internal Medicine

## 2014-06-25 ENCOUNTER — Encounter: Payer: Self-pay | Admitting: Gastroenterology

## 2014-07-30 ENCOUNTER — Telehealth: Payer: Self-pay | Admitting: *Deleted

## 2014-07-30 NOTE — Telephone Encounter (Signed)
Received Non Formulary Exception Request Form from [facility] # [phone] for patient's Celebrex. Filled out some portion of it and given to Dr. Nyoka Cowden to finish, Review and sign. Member ID: [id number]

## 2014-08-06 ENCOUNTER — Encounter: Payer: Self-pay | Admitting: Gastroenterology

## 2014-08-06 ENCOUNTER — Telehealth: Payer: Self-pay | Admitting: *Deleted

## 2014-08-06 ENCOUNTER — Ambulatory Visit (INDEPENDENT_AMBULATORY_CARE_PROVIDER_SITE_OTHER): Payer: Medicare Other | Admitting: Gastroenterology

## 2014-08-06 VITALS — BP 140/78 | HR 64 | Ht 66.0 in | Wt 160.1 lb

## 2014-08-06 DIAGNOSIS — K648 Other hemorrhoids: Secondary | ICD-10-CM | POA: Diagnosis not present

## 2014-08-06 MED ORDER — NAPROXEN SODIUM 220 MG PO TABS: ORAL_TABLET | ORAL | Status: DC

## 2014-08-06 NOTE — Telephone Encounter (Signed)
Kenney Houseman, Caregiver, # [phone] called and stated that patient's Celebrex is not covered anymore by insurance and needs it changed to Naproxen 250mg  and wanted to know if this was ok to take, Please advise.

## 2014-08-06 NOTE — Assessment & Plan Note (Signed)
Limited rectal bleeding, pruritus and soilage are due to symptomatic internal hemorrhoids.  He has tried medical therapy with only temporary success.  Plan to proceed with band ligation of hemorrhoids.  PROCEDURE NOTE: The patient presents with symptomatic grade *2**  hemorrhoids, requesting rubber band ligation of his/her hemorrhoidal disease.  All risks, benefits and alternative forms of therapy were described and informed consent was obtained.   The anorectum was pre-medicated with lubricant and nitroglycerine ointment The decision was made to band the *left lateral** internal hemorrhoid, and the [place] was used to perform band ligation without complication.  Digital anorectal examination was then performed to assure proper positioning of the band, and to adjust the banded tissue as required.  The patient was discharged home without pain or other issues.  Dietary and behavioral recommendations were given and along with follow-up instructions.    The patient will return in *2** weeks for  follow-up and possible additional banding as required. No complications were encountered and the patient tolerated the procedure well.

## 2014-08-06 NOTE — Telephone Encounter (Signed)
Kenney Houseman, caregiver notified and agreed. Stated that patient is not allergic to Aleve. Has already been taking one a day.

## 2014-08-06 NOTE — Patient Instructions (Signed)
You have been scheduled for your 2nd hemorrhoidal banding on Thursday, 08/23/14 @ 9:30 am.  HEMORRHOID BANDING PROCEDURE    FOLLOW-UP CARE   1. The procedure you have had should have been relatively painless since the banding of the area involved does not have nerve endings and there is no pain sensation.  The rubber band cuts off the blood supply to the hemorrhoid and the band may fall off as soon as 48 hours after the banding (the band may occasionally be seen in the toilet bowl following a bowel movement). You may notice a temporary feeling of fullness in the rectum which should respond adequately to plain Tylenol or Motrin.  2. Following the banding, avoid strenuous exercise that evening and resume full activity the next day.  A sitz bath (soaking in a warm tub) or bidet is soothing, and can be useful for cleansing the area after bowel movements.     3. To avoid constipation, take two tablespoons of natural wheat bran, natural oat bran, flax, Benefiber or any over the counter fiber supplement and increase your water intake to 7-8 glasses daily.    4. Unless you have been prescribed anorectal medication, do not put anything inside your rectum for two weeks: No suppositories, enemas, fingers, etc.  5. Occasionally, you may have more bleeding than usual after the banding procedure.  This is often from the untreated hemorrhoids rather than the treated one.  Don't be concerned if there is a tablespoon or so of blood.  If there is more blood than this, lie flat with your bottom higher than your head and apply an ice pack to the area. If the bleeding does not stop within a half an hour or if you feel faint, call our office at [phone] or go to the emergency room.  6. Problems are not common; however, if there is a substantial amount of bleeding, severe pain, chills, fever or difficulty passing urine (very rare) or other problems, you should call us at [phone] or report to the nearest  emergency room.  7. Do not stay seated continuously for more than 2-3 hours for a day or two after the procedure.  Tighten your buttock muscles 10-15 times every two hours and take 10-15 deep breaths every 1-2 hours.  Do not spend more than a few minutes on the toilet if you cannot empty your bowel; instead re-visit the toilet at a later time.

## 2014-08-06 NOTE — Progress Notes (Signed)
_                                                                                                                History of Present Illness:  [name] is a 74 year old white male referred at the request of Dr. Nyoka Cowden for evaluation of rectal bleeding and soilage.  He has seen blood on the toilet tissue frequently with a bowel movement.  He's also had some fecal soilage and complains of pruritus a 9.  He has used hemorrhoidal cream with temporary relief on several occasions.  Colonoscopy in 2009 demonstrated internal hemorrhoids and diverticulosis.  He denies change in bowel habits or abdominal pain.  Past Medical History  Diagnosis Date  . Hypertension 2003  . GERD (gastroesophageal reflux disease) 2003  . Chest pain   . Other seborrheic keratosis 2013  . Pain in joint, site unspecified 04/13/12  . Cramp of limb 10/21/11  . Unspecified tinnitus 04/15/11  . Personal history of fall 04/15/11  . Other seborrheic keratosis 04/13/09  . 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 Abnormality of gait September 07 2007  . Lack of coordination September 07, 2007  . Cervicalgia February 26, 2006  . Allergic rhinitis, cause unspecified 2007  . Diplopia 2006  . Other and unspecified hyperlipidemia 2003  . Other disorders of vitreous 2003  . Unspecified hearing loss 2003  . Lumbago 2003  . Other malaise and fatigue 2003  . Disturbance of skin sensation 2003  . Dizziness and giddiness 2001  . Impotence of organic origin 1999  . Myalgia and myositis, unspecified 1999  . Dysphagia, unspecified(787.20) 1999 and  . Degeneration of intervertebral disc, site unspecified 1998  . Restless legs syndrome (RLS) 1997  . Migraine with aura, without mention of intractable migraine without mention of status migrainosus 1997  . Diverticulosis of colon (without mention of hemorrhage) 1997  . Esophagitis, unspecified 1997  . Diaphragmatic hernia without mention of  obstruction or gangrene 1982    Hiatal hernia  . 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 Arthritis    Past Surgical History  Procedure Laterality Date  . C3-4 anterior cervical surgery  1999    Dr. Hal Neer  . Laminotomy / excision disk posterior cervical spine      Spinal Disk (?4-5)  . Appendectomy  Age 38  . Cardiac catheterization  2001    Dr. Glade Lloyd  . Boil removed      Dr. Donne Hazel  . Colonoscopy  07/20/07    Dr. Sharlett Iles  . Eye surgery Bilateral 2015    cataracts   family history includes Heart disease in his father. There is no history of Diabetes, Colon cancer, Colon polyps, Esophageal cancer, Kidney disease, or Gallbladder disease. Current Outpatient Prescriptions  Medication Sig Dispense Refill  . aspirin 325 MG EC tablet Take 325 mg by mouth at bedtime.      . fexofenadine (ALLEGRA) 180 MG tablet Take 1 tablet (180 mg total) by mouth daily. 30 tablet  0  . HYDROcodone-acetaminophen (NORCO) 10-325 MG per tablet One up to 3 times daily for pain relief 270 tablet 0  . lansoprazole (PREVACID) 30 MG capsule TAKE 1 CAPSULE BY MOUTH EVERY DAY TO REDUCE ACID REFLUX 30 capsule 7  . losartan (COZAAR) 100 MG tablet TAKE 1 TABLET (100 MG TOTAL) BY MOUTH DAILY. 90 tablet 3  . metoprolol succinate (TOPROL-XL) 100 MG 24 hr tablet TAKE 1 TABLET (100 MG TOTAL) BY MOUTH DAILY. 90 tablet 3  . Misc Natural Products (OSTEO BI-FLEX JOINT SHIELD) TABS Take by mouth. Take one tablet twice a day for joint pains.    . mometasone (NASONEX) 50 MCG/ACT nasal spray Place 2 sprays into the nose daily. 1 Puff in each nostril for allergies    . Multiple Vitamin (MULTIVITAMIN) tablet Take 1 tablet by mouth daily.      . tadalafil (CIALIS) 20 MG tablet One prior to intercourse 10 tablet 5  . naproxen sodium (ALEVE) 220 MG tablet Take one tablet by mouth with each meal for inflammation 30 tablet 0   No current facility-administered medications for this visit.   Allergies as of  08/06/2014 - Review Complete 08/06/2014  Allergen Reaction Noted  . Aciphex [rabeprazole sodium]  10/12/2012  . Aleve [naproxen sodium]  10/12/2012  . Effexor [venlafaxine hcl]  10/12/2012  . Serzone [nefazodone]  10/12/2012  . Vivactil [protriptyline hcl]  10/12/2012    reports that he has never smoked. He has quit using smokeless tobacco. His smokeless tobacco use included Chew. He reports that he drinks alcohol. He reports that he does not use illicit drugs.   Review of Systems: Pertinent positive and negative review of systems were noted in the above HPI section. All other review of systems were otherwise negative.  Vital signs were reviewed in today's medical record Physical Exam: General: Well developed , well nourished, no acute distress Skin: anicteric Head: Normocephalic and atraumatic Eyes:  sclerae anicteric, EOMI Ears: Normal auditory acuity Mouth: No deformity or lesions Neck: Supple, no masses or thyromegaly Lungs: Clear throughout to auscultation Heart: Regular rate and rhythm; no murmurs, rubs or bruits Abdomen: Soft, non tender and non distended. No masses, hepatosplenomegaly or hernias noted. Normal Bowel sounds Rectal: There are no external lesions Musculoskeletal: Symmetrical with no gross deformities  Skin: No lesions on visible extremities Pulses:  Normal pulses noted Extremities: No clubbing, cyanosis, edema or deformities noted Neurological: Alert oriented x 4, grossly nonfocal Cervical Nodes:  No significant cervical adenopathy Inguinal Nodes: No significant inguinal adenopathy Psychological:  Alert and cooperative. Normal mood and affect  Anoscopic exam was performed.  Small internal hemorrhoids were seen.

## 2014-08-06 NOTE — Telephone Encounter (Signed)
This is okay to take. He can get the same substance by using over-the-counter Aleve 220 mg tablets to be taken one at each meal (3 daily).

## 2014-08-22 ENCOUNTER — Ambulatory Visit (INDEPENDENT_AMBULATORY_CARE_PROVIDER_SITE_OTHER): Payer: Medicare Other | Admitting: Internal Medicine

## 2014-08-22 ENCOUNTER — Encounter: Payer: Self-pay | Admitting: Internal Medicine

## 2014-08-22 VITALS — BP 134/78 | HR 52 | Temp 98.1°F | Ht 66.0 in | Wt 161.4 lb

## 2014-08-22 DIAGNOSIS — G118 Other hereditary ataxias: Secondary | ICD-10-CM | POA: Diagnosis not present

## 2014-08-22 DIAGNOSIS — I10 Essential (primary) hypertension: Secondary | ICD-10-CM | POA: Diagnosis not present

## 2014-08-22 DIAGNOSIS — R1310 Dysphagia, unspecified: Secondary | ICD-10-CM | POA: Diagnosis not present

## 2014-08-22 DIAGNOSIS — M545 Low back pain, unspecified: Secondary | ICD-10-CM

## 2014-08-22 DIAGNOSIS — C4442 Squamous cell carcinoma of skin of scalp and neck: Secondary | ICD-10-CM | POA: Insufficient documentation

## 2014-08-22 DIAGNOSIS — R471 Dysarthria and anarthria: Secondary | ICD-10-CM | POA: Diagnosis not present

## 2014-08-22 DIAGNOSIS — G119 Hereditary ataxia, unspecified: Secondary | ICD-10-CM

## 2014-08-22 MED ORDER — HYDROCODONE-ACETAMINOPHEN 10-325 MG PO TABS: ORAL_TABLET | ORAL | Status: DC

## 2014-08-22 NOTE — Progress Notes (Signed)
Patient ID: Justin [name], male   DOB: 1940-08-26, 74 y.o.   MRN: [id number]    Facility  [facility]    Place of Service:   OFFICE   Allergies  Allergen Reactions  . Aciphex [Rabeprazole Sodium]   . Aleve [Naproxen Sodium]   . Effexor [Venlafaxine Hcl]   . Serzone [Nefazodone]   . Vivactil [Protriptyline Hcl]     Chief Complaint  Patient presents with  . Medical Management of Chronic Issues    6 Month Follow up. Complains of sore on top of head.    HPI:  SCC (squamous cell carcinoma), scalp/neck: Superior aspect of the scalp. His abortive growth from it. It was frozen last time he was here, but has relapsed.  Essential hypertension: Controlled  Spinocerebellar disease: Gradual worsening. Asking about speech therapy for his dysarthria. 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 He is seeing Dr. Deatra Ina at [facility]. He is having banding done and feels that this is progressing well.    Medications: Patient's Medications  New Prescriptions   No medications on file  Previous Medications   ASPIRIN 325 MG EC TABLET    Take 325 mg by mouth at bedtime.     HYDROCODONE-ACETAMINOPHEN (NORCO) 10-325 MG PER TABLET    One up to 3 times daily for pain relief   LANSOPRAZOLE (PREVACID) 30 MG CAPSULE    TAKE 1 CAPSULE BY MOUTH EVERY DAY TO REDUCE ACID REFLUX   LOSARTAN (COZAAR) 100 MG TABLET    TAKE 1 TABLET (100 MG TOTAL) BY MOUTH DAILY.   METOPROLOL SUCCINATE (TOPROL-XL) 100 MG 24 HR TABLET    TAKE 1 TABLET (100 MG TOTAL) BY MOUTH DAILY.   MOMETASONE (NASONEX) 50 MCG/ACT NASAL SPRAY    Place 2 sprays into the nose daily. 1 Puff in each nostril for allergies   NAPROXEN SODIUM (ALEVE) 220 MG TABLET    Take one tablet by mouth with each meal  for inflammation   TADALAFIL (CIALIS) 20 MG TABLET    One prior to intercourse  Modified Medications   No medications on file  Discontinued Medications   FEXOFENADINE (ALLEGRA) 180 MG TABLET    Take 1 tablet (180 mg total) by mouth daily.   MISC NATURAL PRODUCTS (OSTEO BI-FLEX JOINT SHIELD) TABS    Take by mouth. Take one tablet twice a day for joint pains.   MULTIPLE VITAMIN (MULTIVITAMIN) TABLET    Take 1 tablet by mouth daily.       Review of Systems  Constitutional: Positive for activity change and fatigue. Negative for fever, chills, diaphoresis and appetite change.  HENT: Positive for hearing loss. Negative for congestion.        Tinnitus.   Eyes: Positive for visual disturbance (corrective lenses).  Respiratory: Negative for cough, choking, chest tightness and wheezing.   Cardiovascular: Negative for chest pain, palpitations and leg swelling.  Gastrointestinal: Positive for diarrhea. Negative for nausea, abdominal pain, abdominal distention and rectal pain.       Dysphagia, esp crackers and liquids.  Endocrine: Negative.   Genitourinary: Negative.   Musculoskeletal: Positive for back pain, arthralgias, gait problem (using cane) and neck stiffness. Negative for myalgias and neck pain.  Skin: Negative.   Allergic/Immunologic: Negative.   Neurological: Positive for speech difficulty.  Familial Spinocerebellar degeneration.  Hematological: Negative.   Psychiatric/Behavioral: Negative for suicidal ideas, behavioral problems, confusion, sleep disturbance, decreased concentration and agitation. The patient is not hyperactive.     Filed Vitals:   08/22/14 1539  BP: 140/82  Pulse: 52  Temp: 98.1 F (36.7 C)  TempSrc: Oral  Height: 5\' 6"  (1.676 m)  Weight: 161 lb 6.4 oz (73.211 kg)   Body mass index is 26.06 kg/(m^2).  Physical Exam  Constitutional: He is oriented to person, place, and time. He appears well-developed and well-nourished. No distress.  HENT:  Head:  Normocephalic.  Right Ear: External ear normal.  Left Ear: External ear normal.  Nose: Nose normal.  Mouth/Throat: Oropharynx is clear and moist.  Eyes: Conjunctivae and EOM are normal.  Neck: Neck supple. No JVD present. No tracheal deviation present. No thyromegaly present.  Dysphonia and "scanning" speech.  Cardiovascular: Normal rate, regular rhythm, normal heart sounds and intact distal pulses.  Exam reveals no gallop and no friction rub.   No murmur heard. Pulmonary/Chest: No respiratory distress. He has no wheezes. He has no rales. He exhibits no tenderness.  Abdominal: He exhibits no distension and no mass. There is no tenderness.  Musculoskeletal: He exhibits no edema or tenderness.  Gait disturbance. Using a cane.  Lymphadenopathy:    He has no cervical adenopathy.  Neurological: He is alert and oriented to person, place, and time. He has normal reflexes. No cranial nerve deficit. Coordination abnormal.  Ataxic.Dysphagia. Dysphonia. Poor balance.  Skin: No rash noted. No erythema. No pallor.  Psychiatric: He has a normal mood and affect. His behavior is normal. Judgment and thought content normal.     Labs reviewed: No visits with results within 3 Month(s) from this visit. Latest known visit with results is:  Lab on 02/19/2014  Component Date Value Ref Range Status  . Glucose 02/19/2014 98  65 - 99 mg/dL Final  . BUN 02/19/2014 15  8 - 27 mg/dL Final  . Creatinine, Ser 02/19/2014 0.94  0.76 - 1.27 mg/dL Final  . GFR calc non Af Amer 02/19/2014 81  >59 mL/min/1.73 Final  . GFR calc Af Amer 02/19/2014 93  >59 mL/min/1.73 Final  . BUN/Creatinine Ratio 02/19/2014 16  10 - 22 Final  . Sodium 02/19/2014 141  134 - 144 mmol/L Final  . Potassium 02/19/2014 4.3  3.5 - 5.2 mmol/L Final  . Chloride 02/19/2014 102  97 - 108 mmol/L Final  . CO2 02/19/2014 24  18 - 29 mmol/L Final  . Calcium 02/19/2014 9.2  8.6 - 10.2 mg/dL Final  . Cholesterol, Total 02/19/2014 208* 100 - 199  mg/dL Final  . Triglycerides 02/19/2014 144  0 - 149 mg/dL Final  . HDL 02/19/2014 54  >39 mg/dL Final   Comment: According to ATP-III Guidelines, HDL-C >59 mg/dL is considered a                          negative risk factor for CHD.  Marland Kitchen VLDL Cholesterol Cal 02/19/2014 29  5 - 40 mg/dL Final  . LDL Calculated 02/19/2014 125* 0 - 99 mg/dL Final  . Chol/HDL Ratio 02/19/2014 3.9  0.0 - 5.0 ratio units Final   Comment:                                   T. Chol/HDL Ratio  Men  Women                                                        1/2 Avg.Risk  3.4    3.3                                                            Avg.Risk  5.0    4.4                                                         2X Avg.Risk  9.6    7.1                                                         3X Avg.Risk 23.4   11.0  . Glucose 02/19/2014 CANCELED   Final-Edited   Comment: Please refer to the following specimen for additional lab results.                          SEE [id number]                                                    Result canceled by the ancillary  . BUN 02/19/2014 CANCELED   Final-Edited   Comment: Test not performed                                                    Result canceled by the ancillary  . Creatinine, Ser 02/19/2014 CANCELED   Final-Edited   Comment: Test not performed                                                    Result canceled by the ancillary  . Sodium 02/19/2014 CANCELED   Final-Edited   Comment: Test not performed                                                    Result canceled by the ancillary  . Potassium 02/19/2014 CANCELED   Final-Edited   Comment: Test not performed  Result canceled by the ancillary  . Chloride 02/19/2014 CANCELED   Final-Edited   Comment: Test not performed                                                    Result canceled  by the ancillary  . CO2 02/19/2014 CANCELED   Final-Edited   Comment: Test not performed                                                    Result canceled by the ancillary  . Calcium 02/19/2014 CANCELED   Final-Edited   Comment: Test not performed                                                    Result canceled by the ancillary  . Cholesterol, Total 02/19/2014 CANCELED   Final-Edited   Comment: Please refer to the following specimen for additional lab results.                          SEE [id number]                                                    Result canceled by the ancillary  . Triglycerides 02/19/2014 CANCELED   Final-Edited   Comment: Test not performed                                                    Result canceled by the ancillary  . HDL 02/19/2014 CANCELED   Final-Edited   Comment: Test not performed                                                    Result canceled by the ancillary  . Vitamin B-12 02/19/2014 246  211 - 946 pg/mL Final  . specimen status report 02/19/2014 Comment   Final   Comment: Written Authorization                          Written Authorization                          Written Authorization Received.                          Authorization received from [facility] 02-22-2014  Logged by Adele Barthel     Assessment/Plan  1. SCC (squamous cell carcinoma), scalp/neck - Ambulatory referral to Dermatology  2. Essential hypertension Controlled  3. Spinocerebellar disease Gradual worsening  4. Dysarthria - Ambulatory referral to Speech Therapy  5. Dysphagia Unchanged  6. Midline low back pain without sciatica - HYDROcodone-acetaminophen (NORCO) 10-325 MG per tablet; One up to 3 times daily for pain relief  Dispense: 270 tablet; Refill: 0

## 2014-08-23 ENCOUNTER — Encounter: Payer: Self-pay | Admitting: Gastroenterology

## 2014-08-23 ENCOUNTER — Ambulatory Visit (INDEPENDENT_AMBULATORY_CARE_PROVIDER_SITE_OTHER): Payer: Medicare Other | Admitting: Gastroenterology

## 2014-08-23 VITALS — BP 144/74 | HR 56 | Ht 66.0 in | Wt 159.4 lb

## 2014-08-23 DIAGNOSIS — K648 Other hemorrhoids: Secondary | ICD-10-CM

## 2014-08-23 NOTE — Patient Instructions (Signed)
HEMORRHOID BANDING PROCEDURE    FOLLOW-UP CARE   1. The procedure you have had should have been relatively painless since the banding of the area involved does not have nerve endings and there is no pain sensation.  The rubber band cuts off the blood supply to the hemorrhoid and the band may fall off as soon as 48 hours after the banding (the band may occasionally be seen in the toilet bowl following a bowel movement). You may notice a temporary feeling of fullness in the rectum which should respond adequately to plain Tylenol or Motrin.  2. Following the banding, avoid strenuous exercise that evening and resume full activity the next day.  A sitz bath (soaking in a warm tub) or bidet is soothing, and can be useful for cleansing the area after bowel movements.     3. To avoid constipation, take two tablespoons of natural wheat bran, natural oat bran, flax, Benefiber or any over the counter fiber supplement and increase your water intake to 7-8 glasses daily.    4. Unless you have been prescribed anorectal medication, do not put anything inside your rectum for two weeks: No suppositories, enemas, fingers, etc.  5. Occasionally, you may have more bleeding than usual after the banding procedure.  This is often from the untreated hemorrhoids rather than the treated one.  Don't be concerned if there is a tablespoon or so of blood.  If there is more blood than this, lie flat with your bottom higher than your head and apply an ice pack to the area. If the bleeding does not stop within a half an hour or if you feel faint, call our office at [phone] or go to the emergency room.  6. Problems are not common; however, if there is a substantial amount of bleeding, severe pain, chills, fever or difficulty passing urine (very rare) or other problems, you should call us at [phone] or report to the nearest emergency room.  7. Do not stay seated continuously for more than 2-3 hours for a day or two  after the procedure.  Tighten your buttock muscles 10-15 times every two hours and take 10-15 deep breaths every 1-2 hours.  Do not spend more than a few minutes on the toilet if you cannot empty your bowel; instead re-visit the toilet at a later time.   Your 3rd banding is scheduled on 10/10/2014 at 8:45am

## 2014-08-23 NOTE — Progress Notes (Signed)
PROCEDURE NOTE: The patient presents with symptomatic grade *2**  hemorrhoids, requesting rubber band ligation of his/her hemorrhoidal disease.  All risks, benefits and alternative forms of therapy were described and informed consent was obtained.   The anorectum was pre-medicated with lubricant and nitroglycerine ointment The decision was made to band the *right posterior** internal hemorrhoid, and the [place] was used to perform band ligation without complication.  Digital anorectal examination was then performed to assure proper positioning of the band, and to adjust the banded tissue as required.  The patient was discharged home without pain or other issues.  Dietary and behavioral recommendations were given and along with follow-up instructions.    The patient will return in *2** weeks for  follow-up and possible additional banding as required. No complications were encountered and the patient tolerated the procedure well.

## 2014-09-12 ENCOUNTER — Other Ambulatory Visit: Payer: Self-pay | Admitting: Dermatology

## 2014-09-14 ENCOUNTER — Ambulatory Visit: Payer: Medicare Other | Attending: Internal Medicine

## 2014-09-14 DIAGNOSIS — R471 Dysarthria and anarthria: Secondary | ICD-10-CM | POA: Diagnosis present

## 2014-09-14 DIAGNOSIS — R1310 Dysphagia, unspecified: Secondary | ICD-10-CM | POA: Insufficient documentation

## 2014-09-14 NOTE — Therapy (Signed)
[facility] [address] [place], [place], [address] Phone: [phone]   Fax:  [phone]  Speech Language Pathology Evaluation  Patient Details  Name: Justin [name] MRN: [id number] Date of Birth: Jun 19, 1940 Referring Provider:  Estill Dooms, MD  Encounter Date: 09/14/2014      End of Session - 09/14/14 1023    Visit Number 1   Number of Visits 6   Date for SLP Re-Evaluation 11/07/14   SLP Start Time 0930   SLP Stop Time  1015   SLP Time Calculation (min) 45 min   Activity Tolerance Patient tolerated treatment well      Past Medical History  Diagnosis Date  . Hypertension 2003  . GERD (gastroesophageal reflux disease) 2003  . Chest pain   . Other seborrheic keratosis 2013  . Pain in joint, site unspecified 04/13/12  . Cramp of limb 10/21/11  . Unspecified tinnitus 04/15/11  . Personal history of fall 04/15/11  . Other seborrheic keratosis 04/13/09  . 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 Abnormality of gait September 07 2007  . Lack of coordination September 07, 2007  . Cervicalgia February 26, 2006  . Allergic rhinitis, cause unspecified 2007  . Diplopia 2006  . Other and unspecified hyperlipidemia 2003  . Other disorders of vitreous 2003  . Unspecified hearing loss 2003  . Lumbago 2003  . Other malaise and fatigue 2003  . Disturbance of skin sensation 2003  . Dizziness and giddiness 2001  . Impotence of organic origin 1999  . Myalgia and myositis, unspecified 1999  . Dysphagia, unspecified(787.20) 1999 and  . Degeneration of intervertebral disc, site unspecified 1998  . Restless legs syndrome (RLS) 1997  . Migraine with aura, without mention of intractable migraine without mention of status migrainosus 1997  . Diverticulosis of colon (without mention of hemorrhage) 1997  . Esophagitis, unspecified 1997  . Diaphragmatic hernia without mention of obstruction or  gangrene 1982    Hiatal hernia  . 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 Arthritis     Past Surgical History  Procedure Laterality Date  . C3-4 anterior cervical surgery  1999    Dr. Hal Neer  . Laminotomy / excision disk posterior cervical spine      Spinal Disk (?4-5)  . Appendectomy  Age 74  . Cardiac catheterization  2001    Dr. Glade Lloyd  . Boil removed      Dr. Donne Hazel  . Colonoscopy  07/20/07    Dr. Sharlett Iles  . Eye surgery Bilateral 2015    cataracts    There were no vitals filed for this visit.  Visit Diagnosis: Ataxic dysarthria  Dysphagia      Subjective Assessment - 09/14/14 1020    Subjective Diagnosed approx 3 years ago. Reports that if he rests, speech improves. Conversely, if he uses his speech, speech worsens.     Patient is accompained by: --  alone            SLP Evaluation [facility] - 09/14/14 0940    SLP Visit Information   Medical Diagnosis Spinocerebellar Ataxia   General Information   HPI Pt reports he compensates for intelligibility with slowed speech and overarticulated speech and his speech is clearer. Reports fatigue later in the day- approx 2pm.   Expression   Primary Mode of Expression Verbal   Verbal Expression   Overall Verbal Expression Appears within functional limits for tasks assessed   Oral Motor/Sensory  Function   Overall Oral Motor/Sensory Function Impaired   Labial ROM Within Functional Limits   Labial Strength Reduced  minimal   Labial Coordination Reduced   Lingual ROM Within Functional Limits   Lingual Strength Reduced  minimal   Lingual Coordination Reduced   Velum --  reduced speed of movement   Overall Oral Motor/Sensory Function Pt presents with mod oral-motor deficits due to SCA.    Motor Speech   Overall Motor Speech Impaired   Respiration Impaired   Level of Impairment Phrase   Phonation Low vocal intensity  variable   Resonance Hypernasality  variable   Articulation Impaired   Level  of Impairment Phrase   Intelligibility Intelligibility reduced   Sentence 75-100% accurate   Conversation 75-100% accurate   Motor Speech Errors Consistent;Aware   Interfering Components Premorbid status   Effective Techniques Slow rate;Increased vocal intensity;Over-articulate;Pause    Pt reports that with meals he coughs approx x1/week. If he keeps liquid bolus anteriorly and takes smaller sips, and stays away nuts and salads, frequency of coughing is less. No s/s aspiration PNA observed or reported today.  Pt rates his speech as 10/10 (where 10=most intelligible speech possible), if he compensates by overarticulating and reducing speech rate. He reports by approx 2pm that it becomes very difficult to compensate and speech clarity decreases to approx 2-3/10. He also compensates by scheduling talk-intensive meetings in the mornings only.          SLP Education - 09/14/14 1022    Education Details compensations for swallowing and spech, plan of care goals (compensations), and modified barium swallow exam details/rationale   Person(s) Educated Patient   Methods Explanation;Demonstration   Comprehension Verbalized understanding;Returned demonstration            SLP Long Term Goals - 09/14/14 1036    SLP LONG TERM GOAL #1   Title pt will tell SLP 4 signs/symptoms aspiration PNAN with modified independence   Time 4   Period Weeks   Status New   SLP LONG TERM GOAL #2   Title pt will demo abdominal breathing in sentences 80% success in 15/20 responses   Time 4   Period Weeks   Status New   SLP LONG TERM GOAL #3   Title pt will report decr'd frequency of coughing with nectar-thick liquids at home   Time 4   Period Weeks   Status New   SLP LONG TERM GOAL #4   Title pt will demo intelligibility in 10 minutes convesation of 95% with modified indpendence (compensations)   Time 4   Period Weeks   Status New   SLP LONG TERM GOAL #5   Title pt will tell SLP benefits of objective  swallow assessment   Time 4   Period Weeks   Status New          Plan - 09/14/14 1023    Clinical Impression Statement Pt presents with reported dysphagia primarily with thin liquids, and mod dysarthria characterized by imprecise articulation, reduced breath support/coordination, and sluggish resonance musculature (VP port). Currently pt is compensating for both deficits fairly well, but he would like to pursue skilled ST and SLP agrees tx would be beneficial to improve articulation and to educate re: abdominal breathing and safe swallowing/aspiration PNA.   Speech Therapy Frequency 1x /week   Duration --  6 weeks - maybe closer to 4 depending on progress   Treatment/Interventions SLP instruction and feedback;Internal/external aids;Compensatory strategies;Cueing hierarchy;Functional tasks;Patient/family education   Potential to  Achieve Goals Good   Potential Considerations Co-morbidities;Medical prognosis;Severity of impairments          G-Codes - 20-Sep-2014 1040    Functional Assessment Tool Used noms - 5   Functional Limitations Motor speech   Motor Speech Current Status [phone]) At least 40 percent but less than 60 percent impaired, limited or restricted   Motor Speech Goal Status (T0211) At least 20 percent but less than 40 percent impaired, limited or restricted      Problem List Patient Active Problem List   Diagnosis Date Noted  . 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 Internal hemorrhoids without complication 17/35/6701  . Arthritis 02/21/2014  . Dysarthria 02/21/2014  . Seborrheic keratosis 02/21/2014  . 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 Tinnitus 04/12/2013  . 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/16/2012  . 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 Hypertension   . GERD (gastroesophageal reflux disease)   . Abnormality of gait   . Other and unspecified hyperlipidemia   . Lumbago   . Dysphagia     Garald Balding, SLP 09/20/14, 10:42 AM  [facility] [address] [place], [place], [address] Phone: [phone]   Fax:  [phone]

## 2014-09-14 NOTE — Patient Instructions (Signed)
   You are compensating well with your speech and swallowing.  Think about increasing your effort when you talk, in order to be a bit louder.  Think about having thicker drinks (buttermilk, Boost/Ensure, and/or V8/tomato juice in order to decrease coughing frequency  At some point in time a swallowing evaluation ("modified barium swallow examination") may be necessary to assess what kinds of foods and liquids you can eat to limit the amount and frequency of food or liquid getting into your lungs.

## 2014-09-27 ENCOUNTER — Ambulatory Visit: Payer: Medicare Other

## 2014-09-27 DIAGNOSIS — R471 Dysarthria and anarthria: Secondary | ICD-10-CM

## 2014-09-27 DIAGNOSIS — R1310 Dysphagia, unspecified: Secondary | ICD-10-CM | POA: Diagnosis not present

## 2014-09-27 NOTE — Patient Instructions (Signed)
Practice abdominal breathing twice a day for 15 minutes. Use a mirror if needed.

## 2014-09-27 NOTE — Therapy (Signed)
[facility] [address] [place], [place], [address] Phone: [phone]   Fax:  [phone]  Speech Language Pathology Treatment  Patient Details  Name: Justin [name] MRN: [id number] Date of Birth: 06-01-1941 Referring Provider:  Estill Dooms, MD  Encounter Date: 09/27/2014      End of Session - 09/27/14 3235    Visit Number 2   Number of Visits 6   Date for SLP Re-Evaluation 11/07/14   SLP Start Time 0805   SLP Stop Time  0846   SLP Time Calculation (min) 41 min   Activity Tolerance Patient tolerated treatment well      Past Medical History  Diagnosis Date  . Hypertension 2003  . GERD (gastroesophageal reflux disease) 2003  . Chest pain   . Other seborrheic keratosis 2013  . Pain in joint, site unspecified 04/13/12  . Cramp of limb 10/21/11  . Unspecified tinnitus 04/15/11  . Personal history of fall 04/15/11  . Other seborrheic keratosis 04/13/09  . 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 Abnormality of gait September 07 2007  . Lack of coordination September 07, 2007  . Cervicalgia February 26, 2006  . Allergic rhinitis, cause unspecified 2007  . Diplopia 2006  . Other and unspecified hyperlipidemia 2003  . Other disorders of vitreous 2003  . Unspecified hearing loss 2003  . Lumbago 2003  . Other malaise and fatigue 2003  . Disturbance of skin sensation 2003  . Dizziness and giddiness 2001  . Impotence of organic origin 1999  . Myalgia and myositis, unspecified 1999  . Dysphagia, unspecified(787.20) 1999 and  . Degeneration of intervertebral disc, site unspecified 1998  . Restless legs syndrome (RLS) 1997  . Migraine with aura, without mention of intractable migraine without mention of status migrainosus 1997  . Diverticulosis of colon (without mention of hemorrhage) 1997  . Esophagitis, unspecified 1997  . Diaphragmatic hernia without mention of obstruction or  gangrene 1982    Hiatal hernia  . 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 Arthritis     Past Surgical History  Procedure Laterality Date  . C3-4 anterior cervical surgery  1999    Dr. Hal Neer  . Laminotomy / excision disk posterior cervical spine      Spinal Disk (?4-5)  . Appendectomy  Age 20  . Cardiac catheterization  2001    Dr. Glade Lloyd  . Boil removed      Dr. Donne Hazel  . Colonoscopy  07/20/07    Dr. Sharlett Iles  . Eye surgery Bilateral 2015    cataracts    There were no vitals filed for this visit.  Visit Diagnosis: Ataxic dysarthria      Subjective Assessment - 09/27/14 0810    Subjective Pt reports using his speech a lot Monday morning and Monday afternoon speech was very difficult to produce.               ADULT SLP TREATMENT - 09/27/14 0811    General Information   Behavior/Cognition Alert;Cooperative;Pleasant mood   Treatment Provided   Treatment provided Cognitive-Linquistic   Pain Assessment   Pain Assessment No/denies pain   Cognitive-Linquistic Treatment   Treatment focused on Dysarthria   Skilled Treatment SLP educated pt re: copmensations using the analogy of a  "speech bank account" where pt is overdrawn by 2pm. SLP educated pt re: abdominal breathing and pt req'd visual,demo, tactile, and verbal cues from SLP consistently, faded to min verbal and tactile  cues, rarely. Pt achieved approx 80% success in 15 minutes of practice. Pt's intelligibility with conversation was approx 90% today, SLP encouraged pt to cont to use speech compensations outside of clinic. SLP educated pt re: types of augmentative communication devices.   Assessment / Recommendations / Plan   Plan Continue with current plan of care   Progression Toward Goals   Progression toward goals Progressing toward goals          SLP Education - 09/27/14 0937    Education provided Yes   Education Details HEP for abdominal breathing, abdominal breathing   Person(s)  Educated Patient   Methods Demonstration;Explanation;Tactile cues;Verbal cues   Comprehension Verbalized understanding;Returned demonstration;Verbal cues required;Tactile cues required;Need further instruction            SLP Long Term Goals - 09/27/14 0939    SLP LONG TERM GOAL #1   Title pt will tell SLP 4 signs/symptoms aspiration PNAN with modified independence   Time 6   Period Weeks   Status On-going   SLP LONG TERM GOAL #2   Title pt will demo abdominal breathing in sentences 80% success in 15/20 responses   Time 6   Period Weeks   Status On-going   SLP LONG TERM GOAL #3   Title pt will report decr'd frequency of coughing with nectar-thick liquids at home   Time 6   Period Weeks   Status On-going   SLP LONG TERM GOAL #4   Title pt will demo intelligibility in 10 minutes convesation of 95% with modified indpendence (compensations)   Time 6   Period Weeks   Status On-going   SLP LONG TERM GOAL #5   Title pt will tell SLP benefits of objective swallow assessment   Time 6   Period Weeks   Status On-going          Plan - 09/27/14 7001    Clinical Impression Statement Pt's dysarthria persists. Pt was educated today on compensations again. Requires cont ST to make abdominal breathing more consistent and to monitor for compensatory strategies across speaking situaitons.   Speech Therapy Frequency 1x /week   Duration --  6 weeks   Treatment/Interventions SLP instruction and feedback;Internal/external aids;Compensatory strategies;Cueing hierarchy;Functional tasks;Patient/family education   Potential to Achieve Goals Good   Potential Considerations Co-morbidities;Medical prognosis;Severity of impairments        Problem List Patient Active Problem List   Diagnosis Date Noted  . 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 Internal hemorrhoids without complication 74/94/4967  . Arthritis 02/21/2014  . Dysarthria 02/21/2014  . Seborrheic keratosis  02/21/2014  . 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 Tinnitus 04/12/2013  . 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/16/2012  . 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 Hypertension   . GERD (gastroesophageal reflux disease)   . Abnormality of gait   . Other and unspecified hyperlipidemia   . Lumbago   . Dysphagia     Garald Balding , SLP  09/27/2014, 9:41 AM  [facility] [address] [place], [place], [address] Phone: [phone]   Fax:  [phone]

## 2014-10-04 ENCOUNTER — Ambulatory Visit: Payer: Medicare Other | Attending: Internal Medicine

## 2014-10-04 DIAGNOSIS — R1310 Dysphagia, unspecified: Secondary | ICD-10-CM | POA: Diagnosis not present

## 2014-10-04 DIAGNOSIS — R471 Dysarthria and anarthria: Secondary | ICD-10-CM | POA: Diagnosis present

## 2014-10-04 NOTE — Therapy (Signed)
[facility] [address] [place], [place], [address] Phone: [phone]   Fax:  [phone]  Speech Language Pathology Treatment  Patient Details  Name: Justin [name] MRN: [id number] Date of Birth: [birth year] Referring Provider:  Estill Dooms, MD  Encounter Date: 10/04/2014      End of Session - 10/04/14 0811    Visit Number 3   Number of Visits 6   Date for SLP Re-Evaluation 11/07/14   SLP Start Time 0808   SLP Stop Time  0852   SLP Time Calculation (min) 44 min   Activity Tolerance Patient tolerated treatment well      Past Medical History  Diagnosis Date  . Hypertension 2003  . GERD (gastroesophageal reflux disease) 2003  . Chest pain   . Other seborrheic keratosis 2013  . Pain in joint, site unspecified 04/13/12  . Cramp of limb 10/21/11  . Unspecified tinnitus 04/15/11  . Personal history of fall 04/15/11  . Other seborrheic keratosis 04/13/09  . 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 Abnormality of gait September 07 2007  . Lack of coordination September 07, 2007  . Cervicalgia February 26, 2006  . Allergic rhinitis, cause unspecified 2007  . Diplopia 2006  . Other and unspecified hyperlipidemia 2003  . Other disorders of vitreous 2003  . Unspecified hearing loss 2003  . Lumbago 2003  . Other malaise and fatigue 2003  . Disturbance of skin sensation 2003  . Dizziness and giddiness 2001  . Impotence of organic origin 1999  . Myalgia and myositis, unspecified 1999  . Dysphagia, unspecified(787.20) 1999 and  . Degeneration of intervertebral disc, site unspecified 1998  . Restless legs syndrome (RLS) 1997  . Migraine with aura, without mention of intractable migraine without mention of status migrainosus 1997  . Diverticulosis of colon (without mention of hemorrhage) 1997  . Esophagitis, unspecified 1997  . Diaphragmatic hernia without mention of obstruction or gangrene  1982    Hiatal hernia  . 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 Arthritis     Past Surgical History  Procedure Laterality Date  . C3-4 anterior cervical surgery  1999    Dr. Hal Neer  . Laminotomy / excision disk posterior cervical spine      Spinal Disk (?4-5)  . Appendectomy  Age 26  . Cardiac catheterization  2001    Dr. Glade Lloyd  . Boil removed      Dr. Donne Hazel  . Colonoscopy  07/20/07    Dr. Sharlett Iles  . Eye surgery Bilateral 2015    cataracts    There were no vitals filed for this visit.  Visit Diagnosis: Ataxic dysarthria  Dysphagia      Subjective Assessment - 10/04/14 0812    Subjective PT practiced his abdominal breathing - reports worked best sitting upright instead of lying down.               ADULT SLP TREATMENT - 10/04/14 0813    General Information   Behavior/Cognition Alert;Cooperative;Pleasant mood   Treatment Provided   Treatment provided Cognitive-Linquistic   Pain Assessment   Pain Assessment No/denies pain   Cognitive-Linquistic Treatment   Treatment focused on Dysarthria   Skilled Treatment SLP facilitated sentence level tasks with abdominal breathing using full breath in order to make verbal expression more efficacious with 75% success. In simple conversation SLP req'd to A pt with visual and demonstration (mod) cues rarely to have 85% success using full breath (  abdominal breathing). SLP further educated pt re: augmentative communication devices. SLP showed pt some websites about different devices and reiterated that pt should begin to peruse these sites now in prep for an evaluation at a later date for which augmentative device might be best for him.    Assessment / Recommendations / Plan   Plan Continue with current plan of care   Progression Toward Goals   Progression toward goals Progressing toward goals          SLP Education - 10/04/14 0925    Education Details augmentative devices, "chunking" strategy    Person(s) Educated Patient   Methods Explanation;Handout   Comprehension Verbalized understanding            SLP Long Term Goals - 10/04/14 0926    SLP LONG TERM GOAL #1   Title pt will tell SLP 4 signs/symptoms aspiration PNA with modified independence   Time 5   Period Weeks   Status On-going   SLP LONG TERM GOAL #2   Title pt will demo abdominal breathing in sentences 80% success in 15/20 responses   Time 5   Period Weeks   Status On-going   SLP LONG TERM GOAL #3   Title pt will report decr'd frequency of coughing with nectar-thick liquids at home   Time 5   Period Weeks   Status On-going   SLP LONG TERM GOAL #4   Title pt will demo intelligibility in 10 minutes convesation of 95% with modified indpendence (compensations)   Time 5   Period Weeks   Status On-going   SLP LONG TERM GOAL #5   Title pt will tell SLP benefits of objective swallow assessment   Time 5   Period Weeks   Status On-going          Plan - 10/04/14 0924    Clinical Impression Statement Pt's dysarthria persists. Pt was educated today re: chunking strategy. Requires cont ST to make abdominal breathing more consistent and to monitor for compensatory strategies across speaking situaitons.   Speech Therapy Frequency 1x /week   Duration --  5 weeks   Treatment/Interventions SLP instruction and feedback;Internal/external aids;Compensatory strategies;Cueing hierarchy;Functional tasks;Patient/family education   Potential to Achieve Goals Good   Potential Considerations Co-morbidities;Medical prognosis;Severity of impairments        Problem List Patient Active Problem List   Diagnosis Date Noted  . 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 Internal hemorrhoids without complication 63/84/6659  . Arthritis 02/21/2014  . Dysarthria 02/21/2014  . Seborrheic keratosis 02/21/2014  . 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 Tinnitus 04/12/2013  . Spinocerebellar disease  10/16/2012  . 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 Hypertension   . GERD (gastroesophageal reflux disease)   . Abnormality of gait   . Other and unspecified hyperlipidemia   . Lumbago   . Dysphagia     Garald Balding, SLP 10/04/2014, 9:27 AM  [facility] [address] [place], [place], [address] Phone: [phone]   Fax:  [phone]

## 2014-10-10 ENCOUNTER — Encounter: Payer: Self-pay | Admitting: Gastroenterology

## 2014-10-10 ENCOUNTER — Ambulatory Visit (INDEPENDENT_AMBULATORY_CARE_PROVIDER_SITE_OTHER): Payer: Medicare Other | Admitting: Gastroenterology

## 2014-10-10 VITALS — BP 154/84 | HR 60 | Ht 64.5 in | Wt 161.5 lb

## 2014-10-10 DIAGNOSIS — K648 Other hemorrhoids: Secondary | ICD-10-CM

## 2014-10-10 NOTE — Patient Instructions (Addendum)
HEMORRHOID BANDING PROCEDURE    FOLLOW-UP CARE   1. The procedure you have had should have been relatively painless since the banding of the area involved does not have nerve endings and there is no pain sensation.  The rubber band cuts off the blood supply to the hemorrhoid and the band may fall off as soon as 48 hours after the banding (the band may occasionally be seen in the toilet bowl following a bowel movement). You may notice a temporary feeling of fullness in the rectum which should respond adequately to plain Tylenol or Motrin.  2. Following the banding, avoid strenuous exercise that evening and resume full activity the next day.  A sitz bath (soaking in a warm tub) or bidet is soothing, and can be useful for cleansing the area after bowel movements.     3. To avoid constipation, take two tablespoons of natural wheat bran, natural oat bran, flax, Benefiber or any over the counter fiber supplement and increase your water intake to 7-8 glasses daily.    4. Unless you have been prescribed anorectal medication, do not put anything inside your rectum for two weeks: No suppositories, enemas, fingers, etc.  5. Occasionally, you may have more bleeding than usual after the banding procedure.  This is often from the untreated hemorrhoids rather than the treated one.  Don't be concerned if there is a tablespoon or so of blood.  If there is more blood than this, lie flat with your bottom higher than your head and apply an ice pack to the area. If the bleeding does not stop within a half an hour or if you feel faint, call our office at [phone] or go to the emergency room.  6. Problems are not common; however, if there is a substantial amount of bleeding, severe pain, chills, fever or difficulty passing urine (very rare) or other problems, you should call us at [phone] or report to the nearest emergency room.  7. Do not stay seated continuously for more than 2-3 hours for a day or two  after the procedure.  Tighten your buttock muscles 10-15 times every two hours and take 10-15 deep breaths every 1-2 hours.  Do not spend more than a few minutes on the toilet if you cannot empty your bowel; instead re-visit the toilet at a later time.     Follow up 12/14/2014 at 9:45 am with Dr Deatra Ina.

## 2014-10-10 NOTE — Progress Notes (Signed)
PROCEDURE NOTE: The patient presents with symptomatic grade *2**  hemorrhoids, requesting rubber band ligation of his/her hemorrhoidal disease.  All risks, benefits and alternative forms of therapy were described and informed consent was obtained.   The anorectum was pre-medicated with lubricant and nitroglycerine ointment The decision was made to band the **right anterior* internal hemorrhoid, and the [place] was used to perform band ligation without complication.  Digital anorectal examination was then performed to assure proper positioning of the band, and to adjust the banded tissue as required.  The patient was discharged home without pain or other issues.  Dietary and behavioral recommendations were given and along with follow-up instructions.    The patient will return in **4* weeks for  follow-up and possible additional banding as required. No complications were encountered and the patient tolerated the procedure well.

## 2014-10-11 ENCOUNTER — Ambulatory Visit: Payer: Medicare Other | Admitting: Speech Pathology

## 2014-10-11 DIAGNOSIS — R471 Dysarthria and anarthria: Secondary | ICD-10-CM

## 2014-10-11 DIAGNOSIS — R1310 Dysphagia, unspecified: Secondary | ICD-10-CM | POA: Diagnosis not present

## 2014-10-11 NOTE — Patient Instructions (Signed)
Continue abdominal breathing and swallow before speaking

## 2014-10-11 NOTE — Therapy (Signed)
[facility] [address] [place], [place], [address] Phone: [phone]   Fax:  [phone]  Speech Language Pathology Treatment  Patient Details  Name: Justin [name] MRN: [id number] Date of Birth: [birth year] Referring Provider:  Estill Dooms, MD  Encounter Date: 10/11/2014      End of Session - 10/11/14 1217    SLP Start Time 0805   SLP Stop Time  0845   SLP Time Calculation (min) 40 min   Activity Tolerance Patient tolerated treatment well      Past Medical History  Diagnosis Date  . Hypertension 2003  . GERD (gastroesophageal reflux disease) 2003  . Chest pain   . Other seborrheic keratosis 2013  . Pain in joint, site unspecified 04/13/12  . Cramp of limb 10/21/11  . Unspecified tinnitus 04/15/11  . Personal history of fall 04/15/11  . Other seborrheic keratosis 04/13/09  . 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 Abnormality of gait September 07 2007  . Lack of coordination September 07, 2007  . Cervicalgia February 26, 2006  . Allergic rhinitis, cause unspecified 2007  . Diplopia 2006  . Other and unspecified hyperlipidemia 2003  . Other disorders of vitreous 2003  . Unspecified hearing loss 2003  . Lumbago 2003  . Other malaise and fatigue 2003  . Disturbance of skin sensation 2003  . Dizziness and giddiness 2001  . Impotence of organic origin 1999  . Myalgia and myositis, unspecified 1999  . Dysphagia, unspecified(787.20) 1999 and  . Degeneration of intervertebral disc, site unspecified 1998  . Restless legs syndrome (RLS) 1997  . Migraine with aura, without mention of intractable migraine without mention of status migrainosus 1997  . Diverticulosis of colon (without mention of hemorrhage) 1997  . Esophagitis, unspecified 1997  . Diaphragmatic hernia without mention of obstruction or gangrene 1982    Hiatal hernia  . 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 Arthritis     Past Surgical History  Procedure Laterality Date  . C3-4 anterior cervical surgery  1999    Dr. Hal Neer  . Laminotomy / excision disk posterior cervical spine      Spinal Disk (?4-5)  . Appendectomy  Age 66  . Cardiac catheterization  2001    Dr. Glade Lloyd  . Boil removed      Dr. Donne Hazel  . Colonoscopy  07/20/07    Dr. Sharlett Iles  . Eye surgery Bilateral 2015    cataracts    There were no vitals filed for this visit.  Visit Diagnosis: Ataxic dysarthria      Subjective Assessment - 10/11/14 0809    Subjective "I do it twice  a day"               ADULT SLP TREATMENT - 10/11/14 0809    General Information   Behavior/Cognition Alert;Cooperative;Pleasant mood   Treatment Provided   Treatment provided Cognitive-Linquistic   Pain Assessment   Pain Assessment No/denies pain   Cognitive-Linquistic Treatment   Treatment focused on Dysarthria   Skilled Treatment Facilitated chunking and abdominal breathing during structured speech tasks at sentence level with occassional  min A, verbal cues and modeling. Simple conversation with chunking and frequent breathing with occassional min A. Pt verbalized compensations for dysarthria with rare min questioing cues.    Assessment / Recommendations / Plan   Plan Continue with current plan of care   Progression Toward Goals   Progression toward goals Progressing toward goals  SLP Education - 10/11/14 0847    Education provided Yes   Education Details compensations for dysarthria including chunking, quiet enviroment, face listener, swallow   Person(s) Educated Patient   Methods Explanation;Demonstration   Comprehension Verbalized understanding;Returned demonstration            SLP Long Term Goals - 10/11/14 1216    SLP LONG TERM GOAL #1   Title pt will tell SLP 4 signs/symptoms aspiration PNA with modified independence   Time 4   Period Weeks   Status On-going   SLP LONG TERM GOAL #2    Title pt will demo abdominal breathing in sentences 80% success in 15/20 responses   Time 4   Period Weeks   Status On-going   SLP LONG TERM GOAL #3   Title pt will report decr'd frequency of coughing with nectar-thick liquids at home   Time 4   Period Days   Status On-going   SLP LONG TERM GOAL #4   Title pt will demo intelligibility in 10 minutes convesation of 95% with modified indpendence (compensations)   Time 4   Period Weeks   Status On-going          Plan - 10/11/14 0848    Clinical Impression Statement Pt requiring occassional min A and frequent modeling to carryover chunking, frequent abdominal breathing during conversation. Continue skilled ST to maximize carryover of strategies and intelligibility   Speech Therapy Frequency 1x /week   Treatment/Interventions SLP instruction and feedback;Internal/external aids;Compensatory strategies;Cueing hierarchy;Functional tasks;Patient/family education   Potential to Achieve Goals Good   Potential Considerations Co-morbidities;Medical prognosis;Severity of impairments        Problem List Patient Active Problem List   Diagnosis Date Noted  . 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 Internal hemorrhoids without complication 06/09/3233  . Arthritis 02/21/2014  . Dysarthria 02/21/2014  . Seborrheic keratosis 02/21/2014  . 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 Tinnitus 04/12/2013  . 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/16/2012  . 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 Hypertension   . GERD (gastroesophageal reflux disease)   . Abnormality of gait   . Other and unspecified hyperlipidemia   . Lumbago   . Dysphagia     Lovvorn, Annye Rusk MS CCC-SLP 10/11/2014, 12:18 PM  [facility] [address] [place], [place], [address] Phone: [phone]   Fax:  [phone]

## 2014-10-25 ENCOUNTER — Encounter: Payer: Medicare Other | Admitting: Speech Pathology

## 2014-11-01 ENCOUNTER — Ambulatory Visit: Payer: Medicare Other

## 2014-11-05 ENCOUNTER — Other Ambulatory Visit: Payer: Self-pay | Admitting: Internal Medicine

## 2014-11-08 ENCOUNTER — Ambulatory Visit: Payer: Medicare Other | Admitting: Speech Pathology

## 2014-12-14 ENCOUNTER — Encounter: Payer: Self-pay | Admitting: Gastroenterology

## 2014-12-14 ENCOUNTER — Ambulatory Visit (INDEPENDENT_AMBULATORY_CARE_PROVIDER_SITE_OTHER): Payer: Medicare Other | Admitting: Gastroenterology

## 2014-12-14 VITALS — BP 104/70 | HR 72 | Ht 66.0 in | Wt 159.1 lb

## 2014-12-14 DIAGNOSIS — K648 Other hemorrhoids: Secondary | ICD-10-CM | POA: Diagnosis not present

## 2014-12-14 DIAGNOSIS — R21 Rash and other nonspecific skin eruption: Secondary | ICD-10-CM | POA: Diagnosis not present

## 2014-12-14 MED ORDER — CLOTRIMAZOLE-BETAMETHASONE 1-0.05 % EX CREA: 1.0000 "application " | TOPICAL_CREAM | Freq: Two times a day (BID) | CUTANEOUS | Status: DC

## 2014-12-14 NOTE — Progress Notes (Signed)
      History of Present Illness:  Justin [name] has returned for follow-up of hemorrhoids.  Leakage and discomfort have completely resolved.  His only complaint is a persistent pruritus between the cheeks superiorly.    Review of Systems: Pertinent positive and negative review of systems were noted in the above HPI section. All other review of systems were otherwise negative.    Current Medications, Allergies, Past Medical History, Past Surgical History, Family History and Social History were reviewed in [employer] record  Vital signs were reviewed in today's medical record. Physical Exam: General: Well developed , well nourished, no acute distress Skin: There is an area of dry, flaky reddened skin in the sacral area between the buttocks cheeks  See Assessment and Plan under Problem List

## 2014-12-14 NOTE — Patient Instructions (Signed)
We are sending in a prescription to your pharmacy

## 2014-12-14 NOTE — Assessment & Plan Note (Signed)
Excellent response to band ligation with resolution of symptoms.

## 2014-12-14 NOTE — Assessment & Plan Note (Signed)
Plan local application of clotrimazole/betamethasone

## 2014-12-22 ENCOUNTER — Other Ambulatory Visit: Payer: Self-pay | Admitting: Internal Medicine

## 2015-02-20 ENCOUNTER — Encounter: Payer: Self-pay | Admitting: Internal Medicine

## 2015-02-20 ENCOUNTER — Ambulatory Visit (INDEPENDENT_AMBULATORY_CARE_PROVIDER_SITE_OTHER): Payer: Medicare Other | Admitting: Internal Medicine

## 2015-02-20 VITALS — BP 130/84 | HR 55 | Temp 96.8°F | Ht 66.0 in | Wt 157.4 lb

## 2015-02-20 DIAGNOSIS — E785 Hyperlipidemia, unspecified: Secondary | ICD-10-CM

## 2015-02-20 DIAGNOSIS — G118 Other hereditary ataxias: Secondary | ICD-10-CM | POA: Diagnosis not present

## 2015-02-20 DIAGNOSIS — R269 Unspecified abnormalities of gait and mobility: Secondary | ICD-10-CM | POA: Diagnosis not present

## 2015-02-20 DIAGNOSIS — I10 Essential (primary) hypertension: Secondary | ICD-10-CM | POA: Diagnosis not present

## 2015-02-20 DIAGNOSIS — K219 Gastro-esophageal reflux disease without esophagitis: Secondary | ICD-10-CM | POA: Diagnosis not present

## 2015-02-20 DIAGNOSIS — G119 Hereditary ataxia, unspecified: Secondary | ICD-10-CM

## 2015-02-20 DIAGNOSIS — B369 Superficial mycosis, unspecified: Secondary | ICD-10-CM

## 2015-02-20 DIAGNOSIS — Z23 Encounter for immunization: Secondary | ICD-10-CM

## 2015-02-20 DIAGNOSIS — N528 Other male erectile dysfunction: Secondary | ICD-10-CM

## 2015-02-20 DIAGNOSIS — N529 Male erectile dysfunction, unspecified: Secondary | ICD-10-CM

## 2015-02-20 MED ORDER — NYSTATIN 100000 UNIT/GM EX CREA
TOPICAL_CREAM | CUTANEOUS | Status: DC
Start: 2015-02-20 — End: 2017-01-07

## 2015-02-20 MED ORDER — TADALAFIL 20 MG PO TABS
ORAL_TABLET | ORAL | Status: DC
Start: 2015-02-20 — End: 2015-08-21

## 2015-02-20 NOTE — Progress Notes (Signed)
Patient ID: Justin [name], male   DOB: [birth year], 74 y.o.   MRN: [id number]    Facility  [facility]    Place of Service:   OFFICE    Allergies  Allergen Reactions  . Aciphex [Rabeprazole Sodium]   . Aleve [Naproxen Sodium]   . Effexor [Venlafaxine Hcl]   . Serzone [Nefazodone]   . Vivactil [Protriptyline Hcl]     Chief Complaint  Patient presents with  . 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 Current status basically unchanged since 6 months ago.  Essential hypertension - controlled  Abnormality of gait - using 3 walker but collapses. No falls.  Gastroesophageal reflux disease without esophagitis - some reflux issues about 1 day per week. Uses Pepcid or similar OTC item. Sometimes uses Gaviscon.  Has never been circumcised. Has been using a clip, is all-betamethasone cream to keep a persistent erythema and itching down. It does not seem to be working well.  Encounter for immunization    Medications: Patient's Medications  New Prescriptions   No medications on file  Previous Medications   ASPIRIN 325 MG EC TABLET    Take 325 mg by mouth at bedtime.     CLOTRIMAZOLE-BETAMETHASONE (LOTRISONE) CREAM    Apply 1 application topically 2 (two) times daily.   HYDROCODONE-ACETAMINOPHEN (NORCO) 10-325 MG PER TABLET    One up to 3 times daily for pain relief   LANSOPRAZOLE (PREVACID) 30 MG CAPSULE    TAKE 1 CAPSULE BY MOUTH EVERY DAY TO REDUCE ACID REFLUX   LOSARTAN (COZAAR) 100 MG TABLET    TAKE 1 TABLET (100 MG TOTAL) BY MOUTH DAILY.   METOPROLOL SUCCINATE (TOPROL-XL) 100 MG 24 HR TABLET    TAKE 1 TABLET BY MOUTH EVERY DAY   MOMETASONE (NASONEX) 50 MCG/ACT NASAL SPRAY    Place 2 sprays into the nose daily. 1 Puff in each nostril for allergies   NAPROXEN SODIUM (ALEVE) 220 MG TABLET    Take one tablet by mouth with each meal for inflammation   TADALAFIL (CIALIS) 20 MG TABLET    One prior to intercourse  Modified Medications   No medications on file  Discontinued Medications   No medications on file     Review of Systems  Constitutional: Positive for activity change and fatigue. Negative for fever, chills, diaphoresis and appetite change.  HENT: Positive for hearing loss. Negative for congestion.        Tinnitus.   Eyes: Positive for visual disturbance (corrective lenses).  Respiratory: Negative for cough, choking, chest tightness and wheezing.   Cardiovascular: Negative for chest pain, palpitations and leg swelling.  Gastrointestinal: Positive for diarrhea. Negative for nausea, abdominal pain, abdominal distention and rectal pain.       Dysphagia, esp crackers and liquids.  Endocrine: Negative.   Genitourinary: Negative.   Musculoskeletal: Positive for back pain, arthralgias, gait problem (using cane) and neck stiffness. Negative for myalgias and neck pain.  Skin: Negative.   Allergic/Immunologic: Negative.   Neurological: Positive for speech difficulty.       Familial Spinocerebellar degeneration.  Hematological: Negative.   Psychiatric/Behavioral: Negative for suicidal ideas, behavioral problems, confusion, sleep disturbance, decreased concentration and agitation. The patient is not hyperactive.     Filed Vitals:   02/20/15 1156  BP: 130/84  Pulse: 55  Temp: 96.8 F (36 C)  TempSrc: Oral  Height:  5' 6"  (1.676 m)  Weight: 157 lb 6.4 oz (71.396 kg)   Body mass index is 25.42 kg/(m^2).  Physical Exam  Constitutional: He is oriented to person, place, and time. He appears well-developed and well-nourished. No distress.  HENT:  Head: Normocephalic.  Right Ear: External ear normal.  Left Ear: External ear normal.  Nose: Nose normal.  Mouth/Throat: Oropharynx is clear and moist.  Eyes: Conjunctivae and EOM are normal.  Neck: Neck supple. No JVD present. No tracheal deviation present. No thyromegaly present.    Dysphonia and "scanning" speech.  Cardiovascular: Normal rate, regular rhythm, normal heart sounds and intact distal pulses.  Exam reveals no gallop and no friction rub.   No murmur heard. Pulmonary/Chest: No respiratory distress. He has no wheezes. He has no rales. He exhibits no tenderness.  Abdominal: He exhibits no distension and no mass. There is no tenderness.  Genitourinary:  Erythema of the penis behind the corona and at the tip. Adherent skin on the left superior side. Not circumcised.  Musculoskeletal: He exhibits no edema or tenderness.  Gait disturbance. Using a cane.  Lymphadenopathy:    He has no cervical adenopathy.  Neurological: He is alert and oriented to person, place, and time. He has normal reflexes. No cranial nerve deficit. Coordination abnormal.  Ataxic.Dysphagia. Dysphonia. Poor balance.  Skin: No rash noted. No erythema. No pallor.  Psychiatric: He has a normal mood and affect. His behavior is normal. Judgment and thought content normal.     Labs reviewed: Lab Summary Latest Ref Rng 02/19/2014 02/19/2014 04/12/2013  Hemoglobin 12.6 - 17.7 g/dL (None) (None) 14.3  Hematocrit 37.5 - 51.0 % (None) (None) 42.1  White count 3.4 - 10.8 x10E3/uL (None) (None) 6.9  Platelet count - (None) (None) (None)  Sodium - CANCELED 141 141  Potassium - CANCELED 4.3 4.8  Calcium - CANCELED 9.2 9.4  Phosphorus - (None) (None) (None)  Creatinine - CANCELED 0.94 0.97  AST 0 - 40 IU/L (None) (None) 19  Alk Phos 39 - 117 IU/L (None) (None) 124(H)  Bilirubin 0.0 - 1.2 mg/dL (None) (None) 0.4  Glucose - CANCELED 98 89  Cholesterol - (None) (None) (None)  HDL cholesterol - CANCELED 54 43  Triglycerides - CANCELED 144 290(H)  LDL Direct - (None) (None) (None)  LDL Calc 0 - 99 mg/dL (None) 125(H) 99  Total protein - (None) (None) (None)  Albumin 3.5 - 4.8 g/dL (None) (None) 4.2   Lab Results  Component Value Date   TSH 1.790 04/12/2013   Lab Results  Component Value Date    BUN 15 02/19/2014   BUN CANCELED 02/19/2014   No results found for: HGBA1C     Assessment/Plan  1. Spinocerebellar disease Unchanged  2. Essential hypertension Controlled - Comprehensive metabolic panel; Future - EKG 12-Lead; Future  3. Abnormality of gait Continue use 3 walker  4. Gastroesophageal reflux disease without esophagitis Continue current medications  5. Encounter for immunization Flu vaccine  6. Fungal dermatosis Trial emanating the steroid component of his cream - nystatin cream (MYCOSTATIN); Apply twice daily to the rash  Dispense: 30 g; Refill: 3  7. Impotence, organic - tadalafil (CIALIS) 20 MG tablet; One prior to intercourse  Dispense: 10 tablet; Refill: 5  8. Dyslipidemia - Lipid panel; Future

## 2015-03-13 ENCOUNTER — Other Ambulatory Visit: Payer: Self-pay | Admitting: Internal Medicine

## 2015-03-15 ENCOUNTER — Other Ambulatory Visit: Payer: Self-pay | Admitting: Internal Medicine

## 2015-03-15 NOTE — Telephone Encounter (Signed)
Spoke with patient, patient states he was taking celebrex and the insurance stop covering it and he would now like to restart celebrex.   Please advise

## 2015-03-15 NOTE — Telephone Encounter (Signed)
If he wants to take this drug despite noncoverage by [employer], it is okay with me. Go ahead with prescription filling.

## 2015-04-16 ENCOUNTER — Encounter: Payer: Self-pay | Admitting: Speech Pathology

## 2015-04-16 NOTE — Therapy (Signed)
[facility] [address] [place], [place], [address] Phone: [phone]   Fax:  [phone]  Patient Details  Name: DONNE BALEY MRN: [id number] Date of Birth: 05/11/1941 Referring Provider:  No ref. provider found  Encounter Date: 04/16/2015   SPEECH THERAPY DISCHARGE SUMMARY  Visits from Start of Care: 4   Current functional level related to goals / functional outcomes:   Goals not met due to pt not returning to speech therapy      Remaining deficits: Ataxic dysarthria, oropharyngeal dysphagia   Education / Equipment: Compensation for dysarthria, diet modifications, swallow precautions Plan: Patient agrees to discharge.  Patient goals were not met. Patient is being discharged due to not returning since the last visit.  ?????        Lovvorn, Annye Rusk  MS, CCC-SLP  04/16/2015, 11:38 AM  [facility] [address] [place], [place], [address] Phone: [phone]   Fax:  [phone]

## 2015-05-06 ENCOUNTER — Other Ambulatory Visit: Payer: Self-pay

## 2015-06-05 ENCOUNTER — Ambulatory Visit: Payer: Self-pay | Admitting: Internal Medicine

## 2015-06-24 ENCOUNTER — Other Ambulatory Visit: Payer: Self-pay | Admitting: Internal Medicine

## 2015-07-29 DIAGNOSIS — H43813 Vitreous degeneration, bilateral: Secondary | ICD-10-CM | POA: Diagnosis not present

## 2015-07-30 ENCOUNTER — Telehealth: Payer: Self-pay

## 2015-07-30 NOTE — Telephone Encounter (Signed)
Prior authorization was received for Celecoxib 200mg  capsules. Prior authorization was initiated by calling [phone]. Patient ID # [id number].  After speaking with a representative of the insurance company, I was instructed to complete the PA by using  [email or web address]. Patient keyword is [id number].  Forms filed electronically and now awaiting determination.

## 2015-08-19 ENCOUNTER — Other Ambulatory Visit: Payer: Medicare Other

## 2015-08-19 DIAGNOSIS — E785 Hyperlipidemia, unspecified: Secondary | ICD-10-CM | POA: Diagnosis not present

## 2015-08-19 DIAGNOSIS — I10 Essential (primary) hypertension: Secondary | ICD-10-CM | POA: Diagnosis not present

## 2015-08-20 LAB — COMPREHENSIVE METABOLIC PANEL
A/G RATIO: 1.9 (ref 1.2–2.2)
ALBUMIN: 4 g/dL (ref 3.5–4.8)
ALK PHOS: 129 IU/L — AB (ref 39–117)
ALT: 19 IU/L (ref 0–44)
AST: 15 IU/L (ref 0–40)
BILIRUBIN TOTAL: 0.4 mg/dL (ref 0.0–1.2)
BUN / CREAT RATIO: 15 (ref 10–22)
BUN: 14 mg/dL (ref 8–27)
CHLORIDE: 103 mmol/L (ref 96–106)
CO2: 23 mmol/L (ref 18–29)
Calcium: 9.1 mg/dL (ref 8.6–10.2)
Creatinine, Ser: 0.92 mg/dL (ref 0.76–1.27)
GFR calc non Af Amer: 82 mL/min/{1.73_m2} (ref >59)
GFR, EST AFRICAN AMERICAN: 94 mL/min/{1.73_m2} (ref >59)
GLOBULIN, TOTAL: 2.1 g/dL (ref 1.5–4.5)
GLUCOSE: 101 mg/dL — AB (ref 65–99)
POTASSIUM: 4.8 mmol/L (ref 3.5–5.2)
SODIUM: 141 mmol/L (ref 134–144)
Total Protein: 6.1 g/dL (ref 6.0–8.5)

## 2015-08-20 LAB — LIPID PANEL
CHOLESTEROL TOTAL: 196 mg/dL (ref 100–199)
Chol/HDL Ratio: 3.6 ratio units (ref 0.0–5.0)
HDL: 54 mg/dL (ref >39)
LDL Calculated: 114 mg/dL — ABNORMAL HIGH (ref 0–99)
Triglycerides: 138 mg/dL (ref 0–149)
VLDL Cholesterol Cal: 28 mg/dL (ref 5–40)

## 2015-08-21 ENCOUNTER — Encounter: Payer: Self-pay | Admitting: Internal Medicine

## 2015-08-21 ENCOUNTER — Ambulatory Visit (INDEPENDENT_AMBULATORY_CARE_PROVIDER_SITE_OTHER): Payer: Medicare Other | Admitting: Internal Medicine

## 2015-08-21 VITALS — BP 128/80 | HR 57 | Temp 97.9°F | Resp 12 | Ht 66.0 in | Wt 160.0 lb

## 2015-08-21 DIAGNOSIS — R1310 Dysphagia, unspecified: Secondary | ICD-10-CM | POA: Diagnosis not present

## 2015-08-21 DIAGNOSIS — Z9181 History of falling: Secondary | ICD-10-CM | POA: Diagnosis not present

## 2015-08-21 DIAGNOSIS — G118 Other hereditary ataxias: Secondary | ICD-10-CM | POA: Diagnosis not present

## 2015-08-21 DIAGNOSIS — R471 Dysarthria and anarthria: Secondary | ICD-10-CM | POA: Diagnosis not present

## 2015-08-21 DIAGNOSIS — R269 Unspecified abnormalities of gait and mobility: Secondary | ICD-10-CM | POA: Diagnosis not present

## 2015-08-21 DIAGNOSIS — K219 Gastro-esophageal reflux disease without esophagitis: Secondary | ICD-10-CM

## 2015-08-21 DIAGNOSIS — I10 Essential (primary) hypertension: Secondary | ICD-10-CM | POA: Diagnosis not present

## 2015-08-21 DIAGNOSIS — N529 Male erectile dysfunction, unspecified: Secondary | ICD-10-CM

## 2015-08-21 DIAGNOSIS — E785 Hyperlipidemia, unspecified: Secondary | ICD-10-CM

## 2015-08-21 DIAGNOSIS — M545 Low back pain, unspecified: Secondary | ICD-10-CM

## 2015-08-21 DIAGNOSIS — N528 Other male erectile dysfunction: Secondary | ICD-10-CM

## 2015-08-21 DIAGNOSIS — G119 Hereditary ataxia, unspecified: Secondary | ICD-10-CM

## 2015-08-21 MED ORDER — HYDROCODONE-ACETAMINOPHEN 10-325 MG PO TABS: ORAL_TABLET | ORAL | Status: DC

## 2015-08-21 MED ORDER — SILDENAFIL CITRATE 20 MG PO TABS: ORAL_TABLET | ORAL | Status: DC

## 2015-08-21 NOTE — Progress Notes (Signed)
Patient ID: Justin [name], male   DOB: 06-12-1940, 75 y.o.   MRN: [id number]    HISTORY AND PHYSICAL  Location:    [facility]   Place of Service:   office  Extended Emergency Contact Information Primary Emergency Contact: [place] Phone: [phone] Relation: None  Advanced Directive information Does patient have an advance directive?: No, Would patient like information on creating an advanced directive?: No - patient declined information  Chief Complaint  Patient presents with  . Annual Exam    Yearly check-up, EKG and MMSE( 6 CIT Dementia Test)- scored (0)- Normal. + Fall Risk     . SOAP Form    SOAP form completion     HPI:   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[place])  - gradual worsening  Dysarthria - gradual worsening over the last 5 years  Dysphagia - stable at this time. Denies coughing with meals.  Dyslipidemia - controlled  Abnormality of gait - using four-wheel walker with seat  Gastroesophageal reflux disease without esophagitis - asymptomatic  Essential hypertension - controlled  History of fall - high risk for further falls. Falls currently center around getting his feet tangled up over other items. One fall resulted in scraping the top of his head. There have been no other serious injuries.    Past Medical History  Diagnosis Date  . Hypertension 2003  . GERD (gastroesophageal reflux disease) 2003  . Chest pain   . Other seborrheic keratosis 2013  . Pain in joint, site unspecified 04/13/12  . Cramp of limb 10/21/11  . Unspecified tinnitus 04/15/11  . Personal history of fall 04/15/11  . Other seborrheic keratosis 04/13/09  . 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 Abnormality of gait September 07 2007  . Lack of coordination September 07, 2007  . Cervicalgia February 26, 2006  . Allergic rhinitis, cause unspecified 2007  . Diplopia 2006  . Other and unspecified hyperlipidemia 2003  . Other disorders of vitreous 2003  .  Unspecified hearing loss 2003  . Lumbago 2003  . Other malaise and fatigue 2003  . Disturbance of skin sensation 2003  . Dizziness and giddiness 2001  . Impotence of organic origin 1999  . Myalgia and myositis, unspecified 1999  . Dysphagia, unspecified(787.20) 1999 and  . Degeneration of intervertebral disc, site unspecified 1998  . Restless legs syndrome (RLS) 1997  . Migraine with aura, without mention of intractable migraine without mention of status migrainosus 1997  . Diverticulosis of colon (without mention of hemorrhage) 1997  . Esophagitis, unspecified 1997  . Diaphragmatic hernia without mention of obstruction or gangrene 1982    Hiatal hernia  . 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 Arthritis     Past Surgical History  Procedure Laterality Date  . C3-4 anterior cervical surgery  1999    Dr. Hal Neer  . Laminotomy / excision disk posterior cervical spine      Spinal Disk (?4-5)  . Appendectomy  Age 19  . Cardiac catheterization  2001    Dr. Glade Lloyd  . Boil removed      Dr. Donne Hazel  . Colonoscopy  07/20/07    Dr. Sharlett Iles  . Eye surgery Bilateral 2015    cataracts    Patient Care Team: Estill Dooms, MD as PCP - General (Internal Medicine)  Social History   Social History  . Marital Status: Married    Spouse Name: N/A  . Number of Children: 2  . Years of Education: N/A  Occupational History  . Real Estate    Social History Main Topics  . Smoking status: Never Smoker   . Smokeless tobacco: Former Systems developer    Types: Chew  . Alcohol Use: 0.0 oz/week    0 Standard drinks or equivalent per week     Comment: Occas. Wine   . Drug Use: No  . Sexual Activity: Not on file   Other Topics Concern  . Not on file   Social History Narrative    reports that he has never smoked. He has quit using smokeless tobacco. His smokeless tobacco use included Chew. He reports that he drinks alcohol. He reports that he does not use illicit  drugs.  Family History  Problem Relation Age of Onset  . Diabetes Neg Hx   . Colon cancer Neg Hx   . Colon polyps Neg Hx   . Heart disease Father   . Esophageal cancer Neg Hx   . Kidney disease Neg Hx   . Gallbladder disease Neg Hx    Family Status  Relation Status Death Age  . Mother Alive     Heart Valve Replacement, Tumor removed from brain, Hysterectomy, Disk problem in Lower back  . Father Deceased 25?    spinocerebellar degeneration; Heart Attack  . Daughter Alive   . Son Alive   . Paternal Aunt      spinocerebellar degeneration  . Paternal Uncle      Spinocerebellar degeneration    Immunization History  Administered Date(s) Administered  . DTaP 06/12/2011  . Influenza Whole 03/01/2013  . Influenza,inj,Quad PF,36+ Mos 02/21/2014, 02/20/2015  . Pneumococcal Conjugate-13 10/11/2013  . Pneumococcal Polysaccharide-23 03/01/2005  . Zoster 10/15/2010    Allergies  Allergen Reactions  . Aciphex [Rabeprazole Sodium]   . Aleve [Naproxen Sodium]   . Effexor [Venlafaxine Hcl]   . Serzone [Nefazodone]   . Vivactil [Protriptyline Hcl]     Medications: Patient's Medications  New Prescriptions   No medications on file  Previous Medications   ASPIRIN 325 MG EC TABLET    Take 325 mg by mouth at bedtime.     CELECOXIB (CELEBREX) 200 MG CAPSULE    TAKE ONE CAPSULE BY MOUTH EVERY DAY FOR ARTHRITIS   HYDROCODONE-ACETAMINOPHEN (NORCO) 10-325 MG PER TABLET    One up to 3 times daily for pain relief   LANSOPRAZOLE (PREVACID) 30 MG CAPSULE    TAKE 1 CAPSULE BY MOUTH EVERY DAY TO REDUCE ACID REFLUX   LOSARTAN (COZAAR) 100 MG TABLET    TAKE 1 TABLET (100 MG TOTAL) BY MOUTH DAILY.   METOPROLOL SUCCINATE (TOPROL-XL) 100 MG 24 HR TABLET    TAKE 1 TABLET BY MOUTH EVERY DAY   MOMETASONE (NASONEX) 50 MCG/ACT NASAL SPRAY    Place 2 sprays into the nose daily. 1 Puff in each nostril for allergies   NAPROXEN SODIUM (ALEVE) 220 MG TABLET    Take one tablet by mouth with each meal for  inflammation   NYSTATIN CREAM (MYCOSTATIN)    Apply twice daily to the rash   TADALAFIL (CIALIS) 20 MG TABLET    One prior to intercourse  Modified Medications   No medications on file  Discontinued Medications   No medications on file    Review of Systems  Constitutional: Positive for activity change and fatigue. Negative for fever, chills, diaphoresis and appetite change.  HENT: Positive for hearing loss. Negative for congestion.        Tinnitus.   Eyes: Positive for visual disturbance (corrective  lenses).  Respiratory: Negative for cough, choking, chest tightness and wheezing.   Cardiovascular: Negative for chest pain, palpitations and leg swelling.  Gastrointestinal: Positive for diarrhea. Negative for nausea, abdominal pain, abdominal distention and rectal pain.       Dysphagia, esp crackers and liquids.  Endocrine: Negative.   Genitourinary: Negative.   Musculoskeletal: Positive for back pain, arthralgias, gait problem (using cane) and neck stiffness. Negative for myalgias and neck pain.  Skin: Negative.   Allergic/Immunologic: Negative.   Neurological: Positive for speech difficulty and headaches.       Familial Spinocerebellar degeneration. Dysarthria and dyshagia. Ataxic unstable gait.  Hematological: Negative.   Psychiatric/Behavioral: Negative for suicidal ideas, behavioral problems, confusion, sleep disturbance, decreased concentration and agitation. The patient is not hyperactive.     Filed Vitals:   08/21/15 1355  BP: 128/80  Pulse: 57  Temp: 97.9 F (36.6 C)  TempSrc: Oral  Resp: 12  Height: 5' 6"  (1.676 m)  Weight: 160 lb (72.576 kg)  SpO2: 95%   Body mass index is 25.84 kg/(m^2). Filed Weights   08/21/15 1355  Weight: 160 lb (72.576 kg)     Physical Exam  Constitutional: He is oriented to person, place, and time. He appears well-developed and well-nourished. No distress.  HENT:  Head: Normocephalic.  Right Ear: External ear normal.  Left Ear:  External ear normal.  Nose: Nose normal.  Mouth/Throat: Oropharynx is clear and moist.  Eyes: Conjunctivae and EOM are normal.  Neck: Neck supple. No JVD present. No tracheal deviation present. No thyromegaly present.  Dysphonia and "scanning" speech.  Cardiovascular: Normal rate, regular rhythm, normal heart sounds and intact distal pulses.  Exam reveals no gallop and no friction rub.   No murmur heard. Pulmonary/Chest: No respiratory distress. He has no wheezes. He has no rales. He exhibits no tenderness.  Abdominal: He exhibits no distension and no mass. There is no tenderness.  Genitourinary:  Erythema of the penis behind the corona and at the tip. Adherent skin on the left superior side. Not circumcised.  Musculoskeletal: He exhibits no edema or tenderness.  Gait disturbance. Using a cane.  Lymphadenopathy:    He has no cervical adenopathy.  Neurological: He is alert and oriented to person, place, and time. He has normal reflexes. No cranial nerve deficit. Coordination abnormal.  Ataxic.Dysphagia. Dysphonia. Poor balance.  Skin: No rash noted. No erythema. No pallor.  Psychiatric: He has a normal mood and affect. His behavior is normal. Judgment and thought content normal.    Labs reviewed: Lab Summary Latest Ref Rng 08/19/2015 02/19/2014 02/19/2014 04/12/2013  Hemoglobin 12.6 - 17.7 g/dL (None) (None) (None) 14.3  Hematocrit 37.5 - 51.0 % (None) (None) (None) 42.1  White count 3.4 - 10.8 x10E3/uL (None) (None) (None) 6.9  Platelet count - (None) (None) (None) (None)  Sodium 134 - 144 mmol/L 141 CANCELED 141 141  Potassium 3.5 - 5.2 mmol/L 4.8 CANCELED 4.3 4.8  Calcium 8.6 - 10.2 mg/dL 9.1 CANCELED 9.2 9.4  Phosphorus - (None) (None) (None) (None)  Creatinine 0.76 - 1.27 mg/dL 0.92 CANCELED 0.94 0.97  AST 0 - 40 IU/L 15 (None) (None) 19  Alk Phos 39 - 117 IU/L 129(H) (None) (None) 124(H)  Bilirubin 0.0 - 1.2 mg/dL 0.4 (None) (None) 0.4  Glucose 65 - 99 mg/dL 101(H) CANCELED 98  89  Cholesterol - (None) (None) (None) (None)  HDL cholesterol >39 mg/dL 54 CANCELED 54 43  Triglycerides 0 - 149 mg/dL 138 CANCELED 144 290(H)  LDL Direct - (  None) (None) (None) (None)  LDL Calc 0 - 99 mg/dL 114(H) (None) 125(H) 99  Total protein - (None) (None) (None) (None)  Albumin 3.5 - 4.8 g/dL 4.0 (None) (None) 4.2   Lab Results  Component Value Date   BUN 14 08/19/2015   No results found for: HGBA1C Lab Results  Component Value Date   TSH 1.790 04/12/2013    08/21/15 EKG: Rate 50. Normal sinus rhythm. Left axis deviation. Otherwise normal EKG.   Assessment/Plan  1. 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[place]) Slow progression  2. Dysarthria Stable  3. Dysphagia  Stable  4. Dyslipidemia Controlled -Lipid panel; Future  5. Abnormality of gait high risk for falls. Continue to use walker   6. Gastroesophageal reflux disease without esophagitis Asymptomatic   7. Essential hypcontrolled  - EKG 12-Lead - Comprehensive metabolic panel; Future  8. History of falls At riskfor additional falls   9. Midline low back pain without sciatica - HYDROcodone-acetaminophen (NORCO) 10-325 MG tablet; One up to 3 times daily for pain relief  Dispense: 270 tablet; Refill: 0  10. Impotence, organic - sildenafil (REVATIO) 20 MG tablet; Take 2-5 tablets prior to intercourse  Dispense: 30 tablet; Refill: 4

## 2015-08-30 ENCOUNTER — Other Ambulatory Visit: Payer: Self-pay | Admitting: Internal Medicine

## 2015-09-10 ENCOUNTER — Telehealth: Payer: Self-pay

## 2015-09-10 NOTE — Telephone Encounter (Signed)
Prior authorization was received for sildenafil 20 mg tablets. Prior authorization was initiated via [email or web address]. Keyword: [id number]. DOB: 07-09-1940.   Awaiting determination.

## 2015-09-12 NOTE — Telephone Encounter (Signed)
Continue the appeal

## 2015-09-12 NOTE — Telephone Encounter (Signed)
Prior authorization for sildenafil 20 mg tablets has been DENIED. An appeal form has been included if appeals process needs to be started. The appeal form will require a supporting statement from the provider stating why this medication is necessary.   Do you want to continue with the appeal or change the prescription?

## 2015-09-12 NOTE — Telephone Encounter (Signed)
Received email from [email or web address] saying that PA request for sildenafil 20 mg tablets had been denied and that [employer] would send a letter stating why request was denied.   Awaiting letter from [facility] to begin appeal process.

## 2015-09-16 NOTE — Telephone Encounter (Signed)
Appeal forms have been completed and placed in Dr. Rolly Salter folder for review. A statement of need must be submitted by provider.   Fax completed forms to [phone]

## 2015-09-19 ENCOUNTER — Other Ambulatory Visit: Payer: Self-pay | Admitting: Internal Medicine

## 2015-09-20 NOTE — Telephone Encounter (Signed)
Called patient per Dr. Rolly Salter note - because this was prescribed for impotence, and they are denying any drug in that category, I have no way to "appeal" the decision. He will need to pay out of his pocket. Patient verbally understood.

## 2015-09-27 ENCOUNTER — Encounter: Payer: Self-pay | Admitting: *Deleted

## 2015-09-27 ENCOUNTER — Other Ambulatory Visit: Payer: Self-pay | Admitting: *Deleted

## 2015-09-27 DIAGNOSIS — N529 Male erectile dysfunction, unspecified: Secondary | ICD-10-CM

## 2015-09-27 MED ORDER — SILDENAFIL CITRATE 20 MG PO TABS: ORAL_TABLET | ORAL | Status: DC

## 2015-11-28 ENCOUNTER — Other Ambulatory Visit: Payer: Self-pay | Admitting: Internal Medicine

## 2015-12-08 ENCOUNTER — Other Ambulatory Visit: Payer: Self-pay | Admitting: Internal Medicine

## 2016-01-17 NOTE — Addendum Note (Signed)
Addended by: BEATTY, SHUENEAKA C on: 01/17/2016 03:44 PM   Modules accepted: Orders  

## 2016-02-07 DIAGNOSIS — Z23 Encounter for immunization: Secondary | ICD-10-CM | POA: Diagnosis not present

## 2016-02-28 ENCOUNTER — Other Ambulatory Visit: Payer: Medicare Other

## 2016-02-28 DIAGNOSIS — E785 Hyperlipidemia, unspecified: Secondary | ICD-10-CM | POA: Diagnosis not present

## 2016-02-28 DIAGNOSIS — I10 Essential (primary) hypertension: Secondary | ICD-10-CM | POA: Diagnosis not present

## 2016-02-28 LAB — COMPREHENSIVE METABOLIC PANEL
ALBUMIN: 4.1 g/dL (ref 3.6–5.1)
ALK PHOS: 106 U/L (ref 40–115)
ALT: 14 U/L (ref 9–46)
AST: 15 U/L (ref 10–35)
BILIRUBIN TOTAL: 0.8 mg/dL (ref 0.2–1.2)
BUN: 16 mg/dL (ref 7–25)
CO2: 27 mmol/L (ref 20–31)
Calcium: 9 mg/dL (ref 8.6–10.3)
Chloride: 105 mmol/L (ref 98–110)
Creat: 0.9 mg/dL (ref 0.70–1.18)
Glucose, Bld: 94 mg/dL (ref 65–99)
POTASSIUM: 4.5 mmol/L (ref 3.5–5.3)
Sodium: 139 mmol/L (ref 135–146)
Total Protein: 6.4 g/dL (ref 6.1–8.1)

## 2016-02-28 LAB — LIPID PANEL
CHOL/HDL RATIO: 4.6 ratio (ref <=5.0)
Cholesterol: 211 mg/dL — ABNORMAL HIGH (ref 125–200)
HDL: 46 mg/dL (ref >=40)
LDL CALC: 119 mg/dL (ref <130)
TRIGLYCERIDES: 231 mg/dL — AB (ref <150)
VLDL: 46 mg/dL — ABNORMAL HIGH (ref <30)

## 2016-03-03 ENCOUNTER — Ambulatory Visit: Payer: Medicare Other | Admitting: Internal Medicine

## 2016-04-01 ENCOUNTER — Encounter: Payer: Self-pay | Admitting: Internal Medicine

## 2016-04-01 ENCOUNTER — Ambulatory Visit (INDEPENDENT_AMBULATORY_CARE_PROVIDER_SITE_OTHER): Payer: Medicare Other | Admitting: Internal Medicine

## 2016-04-01 VITALS — BP 126/78 | HR 56 | Temp 98.0°F | Ht 66.0 in | Wt 160.0 lb

## 2016-04-01 DIAGNOSIS — R059 Cough, unspecified: Secondary | ICD-10-CM | POA: Insufficient documentation

## 2016-04-01 DIAGNOSIS — R05 Cough: Secondary | ICD-10-CM | POA: Diagnosis not present

## 2016-04-01 DIAGNOSIS — G118 Other hereditary ataxias: Secondary | ICD-10-CM

## 2016-04-01 DIAGNOSIS — N3941 Urge incontinence: Secondary | ICD-10-CM | POA: Diagnosis not present

## 2016-04-01 DIAGNOSIS — L89152 Pressure ulcer of sacral region, stage 2: Secondary | ICD-10-CM | POA: Insufficient documentation

## 2016-04-01 DIAGNOSIS — N529 Male erectile dysfunction, unspecified: Secondary | ICD-10-CM | POA: Diagnosis not present

## 2016-04-01 DIAGNOSIS — R471 Dysarthria and anarthria: Secondary | ICD-10-CM | POA: Diagnosis not present

## 2016-04-01 DIAGNOSIS — R269 Unspecified abnormalities of gait and mobility: Secondary | ICD-10-CM | POA: Diagnosis not present

## 2016-04-01 DIAGNOSIS — R1310 Dysphagia, unspecified: Secondary | ICD-10-CM

## 2016-04-01 DIAGNOSIS — G119 Hereditary ataxia, unspecified: Secondary | ICD-10-CM

## 2016-04-01 DIAGNOSIS — M545 Low back pain: Secondary | ICD-10-CM | POA: Diagnosis not present

## 2016-04-01 DIAGNOSIS — G8929 Other chronic pain: Secondary | ICD-10-CM

## 2016-04-01 MED ORDER — OXYBUTYNIN CHLORIDE 5 MG PO TABS: ORAL_TABLET | ORAL | 4 refills | Status: DC

## 2016-04-01 MED ORDER — CELECOXIB 200 MG PO CAPS: ORAL_CAPSULE | ORAL | 3 refills | Status: DC

## 2016-04-01 MED ORDER — SILDENAFIL CITRATE 20 MG PO TABS
ORAL_TABLET | ORAL | 5 refills | Status: DC
Start: 2016-04-01 — End: 2016-09-29

## 2016-04-01 NOTE — Progress Notes (Signed)
Facility  [facility]    Place of Service:   OFFICE    Allergies  Allergen Reactions  . Aciphex [Rabeprazole Sodium]   . Aleve [Naproxen Sodium]   . Effexor [Venlafaxine Hcl]   . Serzone [Nefazodone]   . Vivactil [Protriptyline Hcl]     Chief Complaint  Patient presents with  . 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 Morrie Sheldon of [facility] Dept of Neurology. His disease is progressing.  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 Dry.   Dysarthria - significant, but understandable  Abnormality of gait - using 3 wheel walker now  Dysphagia, unspecified type - contributes to cough  Urgency incontinence - wants to try something for problem of urge incontinence  Decubitus ulcer of sacral region, stage 2 - shallow break in the skin in the intergluteal are. Red and peeling and tender, but deeply ulcerated.  Chronic midline low back pain without sciatica - benefits from hydrocodone    Medications: Patient's Medications  New Prescriptions   No medications on file  Previous Medications   ASPIRIN 325 MG EC TABLET    Take 325 mg by mouth at bedtime.     CELECOXIB (CELEBREX) 200 MG CAPSULE    TAKE ONE CAPSULE BY MOUTH EVERY DAY FOR ARTHRITIS   HYDROCODONE-ACETAMINOPHEN (NORCO) 10-325 MG TABLET    One up to 3 times daily for pain relief   LANSOPRAZOLE (PREVACID) 30 MG CAPSULE    TAKE 1 CAPSULE BY MOUTH EVERY DAY TO REDUCE ACID REFLUX   LOSARTAN (COZAAR) 100 MG TABLET    TAKE 1 TABLET (100 MG TOTAL) BY MOUTH DAILY.   METOPROLOL SUCCINATE (TOPROL-XL) 100 MG 24 HR TABLET    TAKE 1 TABLET BY MOUTH EVERY DAY   MOMETASONE (NASONEX) 50 MCG/ACT NASAL SPRAY    Place 2 sprays into the nose daily. 1 Puff in each nostril for allergies   NAPROXEN SODIUM (ALEVE) 220 MG TABLET    Take one tablet by mouth with  each meal for inflammation   NYSTATIN CREAM (MYCOSTATIN)    Apply twice daily to the rash   SILDENAFIL (REVATIO) 20 MG TABLET    Take 2-5 tablets prior to intercourse  Modified Medications   No medications on file  Discontinued Medications   No medications on file    Review of Systems  Constitutional: Positive for activity change and fatigue. Negative for appetite change, chills, diaphoresis and fever.  HENT: Positive for hearing loss. Negative for congestion.        Tinnitus.  Eyes: Positive for visual disturbance (corrective lenses).  Respiratory: Positive for cough (with oral intake). Negative for choking, chest tightness and wheezing.   Cardiovascular: Negative for chest pain, palpitations and leg swelling.  Gastrointestinal: Negative for abdominal distention, abdominal pain, diarrhea, nausea and rectal pain.       Dysphagia, esp crackers and liquids.  Endocrine: Negative.   Genitourinary: Positive for urgency.       Urge incontinence  Musculoskeletal: Positive for arthralgias, back pain, gait problem (using cane) and neck stiffness. Negative for myalgias and neck pain.  Skin:       Shallow sacrall decubitus in intergluteal fold  Allergic/Immunologic: Negative.   Neurological: Positive for speech difficulty and headaches.       Familial Spinocerebellar degeneration. Dysarthria  and dyshagia. Ataxic unstable gait.  Hematological: Negative.   Psychiatric/Behavioral: Negative for agitation, behavioral problems, confusion, decreased concentration, sleep disturbance and suicidal ideas. The patient is not hyperactive.     Vitals:   04/01/16 1337  BP: 126/78  Pulse: (!) 56  Temp: 98 F (36.7 C)  TempSrc: Oral  SpO2: 98%  Weight: 160 lb (72.6 kg)  Height: 5' 6"  (1.676 m)   Body mass index is 25.82 kg/m. Wt Readings from Last 3 Encounters:  04/01/16 160 lb (72.6 kg)  08/21/15 160 lb (72.6 kg)  02/20/15 157 lb 6.4 oz (71.4 kg)      Physical Exam  Constitutional: He is  oriented to person, place, and time. He appears well-developed and well-nourished. No distress.  HENT:  Head: Normocephalic.  Right Ear: External ear normal.  Left Ear: External ear normal.  Nose: Nose normal.  Mouth/Throat: Oropharynx is clear and moist.  Eyes: Conjunctivae and EOM are normal.  Neck: Neck supple. No JVD present. No tracheal deviation present. No thyromegaly present.  Dysphonia and "scanning" speech.  Cardiovascular: Normal rate, regular rhythm, normal heart sounds and intact distal pulses.  Exam reveals no gallop and no friction rub.   No murmur heard. Pulmonary/Chest: No respiratory distress. He has no wheezes. He has no rales. He exhibits no tenderness.  Abdominal: He exhibits no distension and no mass. There is no tenderness.  Genitourinary:  Genitourinary Comments: Erythema of the penis behind the corona and at the tip. Adherent skin on the left superior side. Not circumcised.  Musculoskeletal: He exhibits no edema or tenderness.  Gait disturbance. Using a cane.  Lymphadenopathy:    He has no cervical adenopathy.  Neurological: He is alert and oriented to person, place, and time. He has normal reflexes. No cranial nerve deficit. Coordination abnormal.  Ataxic.Dysphagia. Dysphonia. Poor balance.  Skin: No rash noted. No erythema. No pallor.  Psychiatric: He has a normal mood and affect. His behavior is normal. Judgment and thought content normal.    Labs reviewed: Lab Summary Latest Ref Rng & Units 02/28/2016 08/19/2015 02/19/2014 02/19/2014  Hemoglobin 13.0-17.0 g/dL (None) (None) (None) (None)  Hematocrit 39.0-52.0 % (None) (None) (None) (None)  White count - (None) (None) (None) (None)  Platelet count - (None) (None) (None) (None)  Sodium 135 - 146 mmol/L 139 141 CANCELED 141  Potassium 3.5 - 5.3 mmol/L 4.5 4.8 CANCELED 4.3  Calcium 8.6 - 10.3 mg/dL 9.0 9.1 CANCELED 9.2  Phosphorus - (None) (None) (None) (None)  Creatinine 0.70 - 1.18 mg/dL 0.90 0.92 CANCELED  0.94  AST 10 - 35 U/L 15 15 (None) (None)  Alk Phos 40 - 115 U/L 106 129(H) (None) (None)  Bilirubin 0.2 - 1.2 mg/dL 0.8 0.4 (None) (None)  Glucose 65 - 99 mg/dL 94 101(H) CANCELED 98  Cholesterol 125 - 200 mg/dL 211(H) (None) (None) (None)  HDL cholesterol >=40 mg/dL 46 54 CANCELED 54  Triglycerides <150 mg/dL 231(H) 138 CANCELED 144  LDL Direct - (None) (None) (None) (None)  LDL Calc <130 mg/dL 119 114(H) (None) 125(H)  Total protein 6.1 - 8.1 g/dL 6.4 (None) (None) (None)  Albumin 3.6 - 5.1 g/dL 4.1 4.0 (None) (None)  Some recent data might be hidden   Lab Results  Component Value Date   TSH 1.790 04/12/2013   Lab Results  Component Value Date   BUN 16 02/28/2016   BUN 14 08/19/2015   BUN 15 02/19/2014   BUN CANCELED 02/19/2014   No results found for: HGBA1C  Assessment/Plan  1. Impotence, organic - sildenafil (REVATIO) 20 MG tablet; Take 2-5 tablets prior to intercourse  Dispense: 30 tablet; Refill: 5 - Comprehensive metabolic panel; Future  2. Cough - CBC with Differential/Platelet; Future  3. 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 Dysarthria unchanged  5. Abnormality of gait Continue to use walker. I advised him there will be a day that it is no longer safe for him to drive. He is planning ways to deal with that.  6. Dysphagia, unspecified type Contributes to cough  7. Urgency incontinence Warned of dry mouth, constipation, visual change - oxybutynin (DITROPAN) 5 MG tablet; One up to 3 times daily to help bladder control  Dispense: 100 tablet; Refill: 4  8. Decubitus ulcer of sacral region, stage 2 Use Vaseline or other ointment. Coverage with mini menstrual pad may help prevent damage to clothing  9. Chronic midline low back pain without sciatica - celecoxib (CELEBREX) 200 MG capsule; TAKE ONE CAPSULE BY MOUTH EVERY DAY FOR ARTHRITIS  Dispense: 90 capsule; Refill: 3

## 2016-06-20 ENCOUNTER — Other Ambulatory Visit: Payer: Self-pay | Admitting: Internal Medicine

## 2016-08-13 DIAGNOSIS — H04123 Dry eye syndrome of bilateral lacrimal glands: Secondary | ICD-10-CM | POA: Diagnosis not present

## 2016-08-13 DIAGNOSIS — H43813 Vitreous degeneration, bilateral: Secondary | ICD-10-CM | POA: Diagnosis not present

## 2016-09-21 ENCOUNTER — Telehealth: Payer: Self-pay | Admitting: *Deleted

## 2016-09-21 ENCOUNTER — Other Ambulatory Visit: Payer: Self-pay | Admitting: *Deleted

## 2016-09-21 MED ORDER — HYDROCODONE-ACETAMINOPHEN 10-325 MG PO TABS: ORAL_TABLET | ORAL | 0 refills | Status: DC

## 2016-09-21 NOTE — Telephone Encounter (Signed)
Patient brought Medication bottle by like you told him to. Patient is requesting a Refill on Hydrocodone APAP 10/325 One tablet by mouth up to 3 times daily #270. Last Filled: 09/02/2015. Medication has lasted patient a year. Upcoming appointment is 09/29/2016. Is this ok to fill for this Quantity. Please Advise.  [place]

## 2016-09-21 NOTE — Telephone Encounter (Signed)
Rx printed and given to Caregiver and wife at wife's appointment. (they brought in bottle)

## 2016-09-21 NOTE — Telephone Encounter (Signed)
It is OK to fill for the quantity requested.

## 2016-09-23 ENCOUNTER — Other Ambulatory Visit: Payer: Medicare Other

## 2016-09-25 ENCOUNTER — Other Ambulatory Visit: Payer: Self-pay | Admitting: Internal Medicine

## 2016-09-29 ENCOUNTER — Encounter: Payer: Self-pay | Admitting: Internal Medicine

## 2016-09-29 ENCOUNTER — Ambulatory Visit (INDEPENDENT_AMBULATORY_CARE_PROVIDER_SITE_OTHER): Payer: Medicare Other | Admitting: Internal Medicine

## 2016-09-29 VITALS — BP 140/70 | HR 59 | Temp 98.3°F | Ht 66.0 in | Wt 161.0 lb

## 2016-09-29 DIAGNOSIS — N3941 Urge incontinence: Secondary | ICD-10-CM | POA: Diagnosis not present

## 2016-09-29 DIAGNOSIS — G8929 Other chronic pain: Secondary | ICD-10-CM

## 2016-09-29 DIAGNOSIS — R269 Unspecified abnormalities of gait and mobility: Secondary | ICD-10-CM

## 2016-09-29 DIAGNOSIS — R471 Dysarthria and anarthria: Secondary | ICD-10-CM | POA: Diagnosis not present

## 2016-09-29 DIAGNOSIS — G118 Other hereditary ataxias: Secondary | ICD-10-CM

## 2016-09-29 DIAGNOSIS — R1310 Dysphagia, unspecified: Secondary | ICD-10-CM

## 2016-09-29 DIAGNOSIS — N529 Male erectile dysfunction, unspecified: Secondary | ICD-10-CM | POA: Diagnosis not present

## 2016-09-29 DIAGNOSIS — M545 Low back pain, unspecified: Secondary | ICD-10-CM

## 2016-09-29 DIAGNOSIS — I10 Essential (primary) hypertension: Secondary | ICD-10-CM

## 2016-09-29 DIAGNOSIS — G119 Hereditary ataxia, unspecified: Secondary | ICD-10-CM

## 2016-09-29 MED ORDER — SILDENAFIL CITRATE 20 MG PO TABS: ORAL_TABLET | ORAL | 5 refills | Status: DC

## 2016-09-29 NOTE — Progress Notes (Signed)
Facility  [facility]    Place of Service:   OFFICE    Allergies  Allergen Reactions  . Aciphex [Rabeprazole Sodium]   . Aleve [Naproxen Sodium]   . Effexor [Venlafaxine Hcl]   . Serzone [Nefazodone]   . Vivactil [Protriptyline Hcl]     Chief Complaint  Patient presents with  . Medical Management of Chronic Issues    6 month routine follow up.     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 No recent falls.  Dysphagia, unspecified type - denies recent problems with choking or coughing at meals  Urgency incontinence - mild problem. only using 1 oxybutynin daily.  Chronic midline low back pain without sciatica - mild and intermittent    Medications: Patient's Medications  New Prescriptions   No medications on file  Previous Medications   ASPIRIN 325 MG EC TABLET    Take 325 mg by mouth at bedtime.     CELECOXIB (CELEBREX) 200 MG CAPSULE    TAKE ONE CAPSULE BY MOUTH EVERY DAY FOR ARTHRITIS   HYDROCODONE-ACETAMINOPHEN (NORCO) 10-325 MG TABLET    One up to 3 times daily for pain relief   LANSOPRAZOLE (PREVACID) 30 MG CAPSULE    TAKE 1 CAPSULE BY MOUTH EVERY DAY TO REDUCE ACID REFLUX   LOSARTAN (COZAAR) 100 MG TABLET    TAKE 1 TABLET (100 MG TOTAL) BY MOUTH DAILY.   METOPROLOL SUCCINATE (TOPROL-XL) 100 MG 24 HR TABLET    TAKE 1 TABLET BY MOUTH EVERY DAY   MOMETASONE (NASONEX) 50 MCG/ACT NASAL SPRAY    Place 2 sprays into the nose daily. 1 Puff in each nostril for allergies   NAPROXEN SODIUM (ALEVE) 220 MG TABLET    Take one tablet by mouth with each meal for inflammation   NYSTATIN CREAM (MYCOSTATIN)    Apply twice daily to the rash   OXYBUTYNIN (DITROPAN) 5 MG TABLET    One up to 3 times daily to help bladder control   SILDENAFIL (REVATIO) 20 MG TABLET    Take 2-5  tablets prior to intercourse  Modified Medications   No medications on file  Discontinued Medications   HYDROCODONE-ACETAMINOPHEN (NORCO) 10-325 MG TABLET    One up to 3 times daily for pain relief    Review of Systems  Constitutional: Positive for activity change and fatigue. Negative for appetite change, chills, diaphoresis and fever.  HENT: Positive for hearing loss. Negative for congestion.        Tinnitus.  Eyes: Positive for visual disturbance (corrective lenses).  Respiratory: Positive for cough (with oral intake). Negative for choking, chest tightness and wheezing.   Cardiovascular: Negative for chest pain, palpitations and leg swelling.  Gastrointestinal: Negative for abdominal distention, abdominal pain, diarrhea, nausea and rectal pain.       Dysphagia, esp crackers and liquids.  Endocrine: Negative.   Genitourinary: Positive for urgency.       Urge incontinence  Musculoskeletal: Positive for arthralgias, back pain, gait problem (using cane) and neck stiffness. Negative for myalgias and neck pain.  Skin:       Shallow sacrall decubitus in intergluteal fold  Allergic/Immunologic: Negative.   Neurological: Positive for speech difficulty and headaches.       Familial Spinocerebellar degeneration. Dysarthria and dyshagia. Ataxic unstable gait.  Hematological: Negative.  Psychiatric/Behavioral: Negative for agitation, behavioral problems, confusion, decreased concentration, sleep disturbance and suicidal ideas. The patient is not hyperactive.     Vitals:   09/29/16 1522  BP: 140/70  Pulse: (!) 59  Temp: 98.3 F (36.8 C)  TempSrc: Oral  SpO2: 97%  Weight: 161 lb (73 kg)  Height: _0  (1.676 m)   Body mass index is 25.99 kg/m. Wt Readings from Last 3 Encounters:  09/29/16 161 lb (73 kg)  04/01/16 160 lb (72.6 kg)  08/21/15 160 lb (72.6 kg)      Physical Exam  Constitutional: He is oriented to person, place, and time. He appears well-developed and  well-nourished. No distress.  HENT:  Head: Normocephalic.  Right Ear: External ear normal.  Left Ear: External ear normal.  Nose: Nose normal.  Mouth/Throat: Oropharynx is clear and moist.  Eyes: Conjunctivae and EOM are normal.  Neck: Neck supple. No JVD present. No tracheal deviation present. No thyromegaly present.  Dysphonia and "scanning" speech.  Cardiovascular: Normal rate, regular rhythm, normal heart sounds and intact distal pulses.  Exam reveals no gallop and no friction rub.   No murmur heard. Pulmonary/Chest: No respiratory distress. He has no wheezes. He has no rales. He exhibits no tenderness.  Abdominal: He exhibits no distension and no mass. There is no tenderness.  Genitourinary:  Genitourinary Comments: Erythema of the penis behind the corona and at the tip. Adherent skin on the left superior side. Not circumcised.  Musculoskeletal: He exhibits no edema or tenderness.  Gait disturbance. Using a cane.  Lymphadenopathy:    He has no cervical adenopathy.  Neurological: He is alert and oriented to person, place, and time. He has normal reflexes. No cranial nerve deficit. Coordination abnormal.  Ataxic.Dysphagia. Dysphonia. Poor balance.  Skin: No rash noted. No erythema. No pallor.  Psychiatric: He has a normal mood and affect. His behavior is normal. Judgment and thought content normal.    Labs reviewed: Lab Summary Latest Ref Rng & Units 02/28/2016 08/19/2015 02/19/2014 02/19/2014  Hemoglobin 13.0-17.0 g/dL (None) (None) (None) (None)  Hematocrit 39.0-52.0 % (None) (None) (None) (None)  White count - (None) (None) (None) (None)  Platelet count - (None) (None) (None) (None)  Sodium 135 - 146 mmol/L 139 141 CANCELED 141  Potassium 3.5 - 5.3 mmol/L 4.5 4.8 CANCELED 4.3  Calcium 8.6 - 10.3 mg/dL 9.0 9.1 CANCELED 9.2  Phosphorus - (None) (None) (None) (None)  Creatinine 0.70 - 1.18 mg/dL 0.90 0.92 CANCELED 0.94  AST 10 - 35 U/L 15 15 (None) (None)  Alk Phos 40 - 115 U/L  106 129(H) (None) (None)  Bilirubin 0.2 - 1.2 mg/dL 0.8 0.4 (None) (None)  Glucose 65 - 99 mg/dL 94 101(H) CANCELED 98  Cholesterol 125 - 200 mg/dL 211(H) (None) (None) (None)  HDL cholesterol >=40 mg/dL 46 54 CANCELED 54  Triglycerides <150 mg/dL 231(H) 138 CANCELED 144  LDL Direct - (None) (None) (None) (None)  LDL Calc <130 mg/dL 119 114(H) (None) 125(H)  Total protein 6.1 - 8.1 g/dL 6.4 (None) (None) (None)  Albumin 3.6 - 5.1 g/dL 4.1 4.0 (None) (None)  Some recent data might be hidden   Lab Results  Component Value Date   TSH 1.790 04/12/2013   Lab Results  Component Value Date   BUN 16 02/28/2016   BUN 14 08/19/2015   BUN 15 02/19/2014   BUN CANCELED 02/19/2014   No results found for: HGBA1C  Assessment/Plan  1. 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 Last time he went to [facility]. He is not eager to do this again.  3. Essential hypertension controlled  4. Dysarthria uncanged  5. Abnormality of gait continue walker  6. Dysphagia, unspecified type Minor issue at present  7. Urgency incontinence Continue oxybutynin  8. Chronic midline low back pain without sciatica miild

## 2016-09-30 ENCOUNTER — Other Ambulatory Visit: Payer: Self-pay | Admitting: *Deleted

## 2016-09-30 DIAGNOSIS — M545 Low back pain: Principal | ICD-10-CM

## 2016-09-30 DIAGNOSIS — G8929 Other chronic pain: Secondary | ICD-10-CM

## 2016-09-30 MED ORDER — CELECOXIB 200 MG PO CAPS: ORAL_CAPSULE | ORAL | 3 refills | Status: DC

## 2016-09-30 NOTE — Telephone Encounter (Signed)
Patient caregiver, Kenney Houseman called and stated that the patient needed a Hard copy Rx to take to pharmacy, doesnt want it called

## 2016-09-30 NOTE — Telephone Encounter (Signed)
Patient requested to be sent to pharmacy.  

## 2016-11-03 DIAGNOSIS — H16221 Keratoconjunctivitis sicca, not specified as Sjogren's, right eye: Secondary | ICD-10-CM | POA: Diagnosis not present

## 2016-11-25 ENCOUNTER — Emergency Department (HOSPITAL_BASED_OUTPATIENT_CLINIC_OR_DEPARTMENT_OTHER): Payer: Medicare Other

## 2016-11-25 ENCOUNTER — Observation Stay (HOSPITAL_BASED_OUTPATIENT_CLINIC_OR_DEPARTMENT_OTHER)
Admission: EM | Admit: 2016-11-25 | Discharge: 2016-11-27 | Disposition: A | Discharge status: Home / Self Care | Payer: Medicare Other | Attending: Internal Medicine | Admitting: Internal Medicine

## 2016-11-25 ENCOUNTER — Encounter (HOSPITAL_BASED_OUTPATIENT_CLINIC_OR_DEPARTMENT_OTHER): Payer: Self-pay | Admitting: Emergency Medicine

## 2016-11-25 DIAGNOSIS — G2581 Restless legs syndrome: Secondary | ICD-10-CM | POA: Diagnosis not present

## 2016-11-25 DIAGNOSIS — Z87891 Personal history of nicotine dependence: Secondary | ICD-10-CM | POA: Diagnosis not present

## 2016-11-25 DIAGNOSIS — R0602 Shortness of breath: Secondary | ICD-10-CM | POA: Diagnosis not present

## 2016-11-25 DIAGNOSIS — K449 Diaphragmatic hernia without obstruction or gangrene: Secondary | ICD-10-CM | POA: Diagnosis not present

## 2016-11-25 DIAGNOSIS — Z8249 Family history of ischemic heart disease and other diseases of the circulatory system: Secondary | ICD-10-CM | POA: Diagnosis not present

## 2016-11-25 DIAGNOSIS — E785 Hyperlipidemia, unspecified: Secondary | ICD-10-CM | POA: Diagnosis present

## 2016-11-25 DIAGNOSIS — Z79891 Long term (current) use of opiate analgesic: Secondary | ICD-10-CM | POA: Diagnosis not present

## 2016-11-25 DIAGNOSIS — Z888 Allergy status to other drugs, medicaments and biological substances status: Secondary | ICD-10-CM | POA: Diagnosis not present

## 2016-11-25 DIAGNOSIS — R079 Chest pain, unspecified: Secondary | ICD-10-CM | POA: Diagnosis present

## 2016-11-25 DIAGNOSIS — Z79899 Other long term (current) drug therapy: Secondary | ICD-10-CM | POA: Diagnosis not present

## 2016-11-25 DIAGNOSIS — K219 Gastro-esophageal reflux disease without esophagitis: Secondary | ICD-10-CM | POA: Diagnosis not present

## 2016-11-25 DIAGNOSIS — L821 Other seborrheic keratosis: Secondary | ICD-10-CM | POA: Diagnosis not present

## 2016-11-25 DIAGNOSIS — R072 Precordial pain: Secondary | ICD-10-CM | POA: Diagnosis not present

## 2016-11-25 DIAGNOSIS — K760 Fatty (change of) liver, not elsewhere classified: Secondary | ICD-10-CM | POA: Diagnosis not present

## 2016-11-25 DIAGNOSIS — I10 Essential (primary) hypertension: Secondary | ICD-10-CM | POA: Diagnosis not present

## 2016-11-25 DIAGNOSIS — J309 Allergic rhinitis, unspecified: Secondary | ICD-10-CM | POA: Diagnosis not present

## 2016-11-25 DIAGNOSIS — K648 Other hemorrhoids: Secondary | ICD-10-CM | POA: Diagnosis not present

## 2016-11-25 DIAGNOSIS — Z7951 Long term (current) use of inhaled steroids: Secondary | ICD-10-CM | POA: Diagnosis not present

## 2016-11-25 DIAGNOSIS — N3941 Urge incontinence: Secondary | ICD-10-CM | POA: Insufficient documentation

## 2016-11-25 DIAGNOSIS — Z7982 Long term (current) use of aspirin: Secondary | ICD-10-CM | POA: Insufficient documentation

## 2016-11-25 DIAGNOSIS — I200 Unstable angina: Secondary | ICD-10-CM

## 2016-11-25 DIAGNOSIS — H9190 Unspecified hearing loss, unspecified ear: Secondary | ICD-10-CM | POA: Diagnosis not present

## 2016-11-25 DIAGNOSIS — I722 Aneurysm of renal artery: Secondary | ICD-10-CM | POA: Insufficient documentation

## 2016-11-25 DIAGNOSIS — G119 Hereditary ataxia, unspecified: Secondary | ICD-10-CM | POA: Diagnosis not present

## 2016-11-25 DIAGNOSIS — Z886 Allergy status to analgesic agent status: Secondary | ICD-10-CM | POA: Insufficient documentation

## 2016-11-25 HISTORY — DX: Ataxia, unspecified: R27.0

## 2016-11-25 LAB — BASIC METABOLIC PANEL
Anion gap: 8 (ref 5–15)
BUN: 15 mg/dL (ref 6–20)
CALCIUM: 9.2 mg/dL (ref 8.9–10.3)
CO2: 25 mmol/L (ref 22–32)
CREATININE: 1.08 mg/dL (ref 0.61–1.24)
Chloride: 105 mmol/L (ref 101–111)
GFR calc Af Amer: >60 mL/min (ref >60)
GFR calc non Af Amer: >60 mL/min (ref >60)
GLUCOSE: 141 mg/dL — AB (ref 65–99)
Potassium: 3.9 mmol/L (ref 3.5–5.1)
Sodium: 138 mmol/L (ref 135–145)

## 2016-11-25 LAB — LIPASE, BLOOD: LIPASE: 32 U/L (ref 11–51)

## 2016-11-25 LAB — HEPATIC FUNCTION PANEL
ALK PHOS: 133 U/L — AB (ref 38–126)
ALT: 17 U/L (ref 17–63)
AST: 19 U/L (ref 15–41)
Albumin: 3.8 g/dL (ref 3.5–5.0)
BILIRUBIN DIRECT: 0.2 mg/dL (ref 0.1–0.5)
BILIRUBIN INDIRECT: 0.5 mg/dL (ref 0.3–0.9)
BILIRUBIN TOTAL: 0.7 mg/dL (ref 0.3–1.2)
Total Protein: 6.2 g/dL — ABNORMAL LOW (ref 6.5–8.1)

## 2016-11-25 LAB — CBC
HCT: 45.1 % (ref 39.0–52.0)
Hemoglobin: 15.8 g/dL (ref 13.0–17.0)
MCH: 30.7 pg (ref 26.0–34.0)
MCHC: 35 g/dL (ref 30.0–36.0)
MCV: 87.7 fL (ref 78.0–100.0)
PLATELETS: 183 10*3/uL (ref 150–400)
RBC: 5.14 MIL/uL (ref 4.22–5.81)
RDW: 13.1 % (ref 11.5–15.5)
WBC: 6.1 10*3/uL (ref 4.0–10.5)

## 2016-11-25 LAB — PROTIME-INR
INR: 0.94
Prothrombin Time: 12.6 seconds (ref 11.4–15.2)

## 2016-11-25 LAB — TROPONIN I: Troponin I: <0.03 ng/mL (ref <0.03)

## 2016-11-25 MED ORDER — FLUTICASONE PROPIONATE 50 MCG/ACT NA SUSP
1.0000 | Freq: Every day | NASAL | Status: DC
  Filled 2016-11-25: qty 16

## 2016-11-25 MED ORDER — HYDRALAZINE HCL 20 MG/ML IJ SOLN: 10.0000 mg | Freq: Four times a day (QID) | INTRAMUSCULAR | Status: DC | PRN

## 2016-11-25 MED ORDER — ENOXAPARIN SODIUM 40 MG/0.4ML ~~LOC~~ SOLN
40.0000 mg | SUBCUTANEOUS | Status: DC
  Administered 2016-11-25 – 2016-11-26 (×2): 40 mg via SUBCUTANEOUS
  Filled 2016-11-25 (×2): qty 0.4

## 2016-11-25 MED ORDER — PANTOPRAZOLE SODIUM 40 MG PO TBEC
40.0000 mg | DELAYED_RELEASE_TABLET | Freq: Every day | ORAL | Status: DC
  Administered 2016-11-25 – 2016-11-27 (×3): 40 mg via ORAL
  Filled 2016-11-25 (×3): qty 1

## 2016-11-25 MED ORDER — ACETAMINOPHEN 325 MG PO TABS: 650.0000 mg | ORAL_TABLET | ORAL | Status: DC | PRN

## 2016-11-25 MED ORDER — GI COCKTAIL ~~LOC~~
30.0000 mL | Freq: Four times a day (QID) | ORAL | Status: DC | PRN
  Administered 2016-11-26: 30 mL via ORAL
  Filled 2016-11-25: qty 30

## 2016-11-25 MED ORDER — METOPROLOL SUCCINATE ER 100 MG PO TB24
100.0000 mg | ORAL_TABLET | Freq: Every day | ORAL | Status: DC
  Administered 2016-11-26 – 2016-11-27 (×2): 100 mg via ORAL
  Filled 2016-11-25 (×2): qty 1

## 2016-11-25 MED ORDER — IOPAMIDOL (ISOVUE-370) INJECTION 76%
100.0000 mL | Freq: Once | INTRAVENOUS | Status: AC | PRN
  Administered 2016-11-25: 100 mL via INTRAVENOUS

## 2016-11-25 MED ORDER — LOSARTAN POTASSIUM 50 MG PO TABS
100.0000 mg | ORAL_TABLET | Freq: Every day | ORAL | Status: DC
  Administered 2016-11-25 – 2016-11-27 (×3): 100 mg via ORAL
  Filled 2016-11-25 (×3): qty 2

## 2016-11-25 MED ORDER — HYDRALAZINE HCL 20 MG/ML IJ SOLN
10.0000 mg | Freq: Once | INTRAMUSCULAR | Status: AC
  Administered 2016-11-25: 10 mg via INTRAVENOUS
  Filled 2016-11-25: qty 1

## 2016-11-25 MED ORDER — ONDANSETRON HCL 4 MG/2ML IJ SOLN: 4.0000 mg | Freq: Four times a day (QID) | INTRAMUSCULAR | Status: DC | PRN

## 2016-11-25 MED ORDER — ASPIRIN 81 MG PO CHEW
324.0000 mg | CHEWABLE_TABLET | Freq: Once | ORAL | Status: AC
  Administered 2016-11-25: 324 mg via ORAL
  Filled 2016-11-25: qty 4

## 2016-11-25 MED ORDER — MORPHINE SULFATE (PF) 2 MG/ML IV SOLN: 2.0000 mg | INTRAVENOUS | Status: DC | PRN

## 2016-11-25 MED ORDER — GI COCKTAIL ~~LOC~~
30.0000 mL | Freq: Once | ORAL | Status: AC
  Administered 2016-11-25: 30 mL via ORAL
  Filled 2016-11-25: qty 30

## 2016-11-25 MED ORDER — OXYBUTYNIN CHLORIDE 5 MG PO TABS
5.0000 mg | ORAL_TABLET | Freq: Two times a day (BID) | ORAL | Status: DC
  Administered 2016-11-25 – 2016-11-27 (×4): 5 mg via ORAL
  Filled 2016-11-25 (×4): qty 1

## 2016-11-25 NOTE — Progress Notes (Signed)
     Patient coming from [facility] for evaluation and treatment of chest pain. CT of the chest without evidence of pulmonary embolus or other intrathoracic acute process. 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 However, chronicity of the lesion was indeterminate. Patient is currently asymptomatic in this regard. Recommend discussing with vascular.. Currently recommending patient's systolic pressure be maintained below 160 and administering IV hydralazine when necessary to do so. Patient with reported cardiac catheterization approximately 10 years ago which was normal. Patient consented to [facility] services under telemetry.  Linna Darner, MD [facility] 11/25/2016, 3:01 PM

## 2016-11-25 NOTE — Consult Note (Signed)
Asked by Dr Allyson Sabal to review pt CT scan.  He was admitted with chest pain.  Focal area of dissection infrarenal aorta.  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  15 mm atherosclerotic plaque just above this may have ruptured and been origin of dissection.  No need for vascular surgical intervention.  Doubt source of pt chest pain.  Feel free to call if clinical condition changes.  Ruta Hinds, MD [facility] Office: [phone] Pager: [phone]

## 2016-11-25 NOTE — ED Notes (Signed)
Patient transported to CT and returned at this time 

## 2016-11-25 NOTE — H&P (Addendum)
[facility] History and Physical  Justin [name] FKC:[id number] DOB: 01-19-1941 DOA: 11/25/2016  Referring physician:  PCP: Gayland Curry, DO   Chief Complaint:  Chest pain  HPI:  76 year old male with a history of gastroesophageal reflux disease, hypertension, restless leg syndrome, spinocerebellar disease, who presents to the ER at [facility] [place] with substernal chest pain, 5 and 10 in intensity, pressure-like quality with radiation into the upper back, bilateral arms 7 days, symptoms progressively getting worse, also made worse with exertion. No prior history of trauma. This morning patient also felt weak, nauseous, noted his blood pressure was running high ED course BP (!) 170/133    Pulse 71   On arrival blood pressure was elevated. EKG showed no changes, no evidence of acute ischemia, chest x-ray unremarkable. 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 Patient sent to [facility] for further evaluation of his chest pain.      Review of Systems: negative for the following  Constitutional: Denies fever, chills, diaphoresis, appetite change and fatigue.  HEENT: Denies photophobia, eye pain, redness, hearing loss, ear pain, congestion, sore throat, rhinorrhea, sneezing, mouth sores, trouble swallowing, neck pain, neck stiffness and tinnitus.  Respiratory: Positive for shortness of breath. Negative for cough, chest tightness, wheezing and stridor.   Cardiovascular: Positive for chest pain. Negative for palpitations, leg swelling and near-syncope.  Gastrointestinal: Positive for nausea. Negative for abdominal pain, constipation, diarrhea and vomiting.  Genitourinary: Negative for flank pain.  Musculoskeletal: Positive for back pain.  Skin: Denies pallor, rash and wound.  Neurological: Denies dizziness, seizures, syncope, weakness, light-headedness, numbness and headaches.  Hematological: Denies adenopathy.  Easy bruising, personal or family bleeding history  Psychiatric/Behavioral: Denies suicidal ideation, mood changes, confusion, nervousness, sleep disturbance and agitation       Past Medical History:  Diagnosis Date  . Abnormality of gait September 07 2007  . Allergic rhinitis, cause unspecified 2007  . Arthritis   . Ataxia   . Cervicalgia February 26, 2006  . Chest pain   . Cramp of limb 10/21/11  . Degeneration of intervertebral disc, site unspecified 1998  . 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 Diaphragmatic hernia without mention of obstruction or gangrene 1982   Hiatal hernia  . Diplopia 2006  . Disturbance of skin sensation 2003  . Diverticulosis of colon (without mention of hemorrhage) 1997  . Dizziness and giddiness 2001  . Dysphagia, unspecified(787.20) 1999 and  . Esophagitis, unspecified 1997  . GERD (gastroesophageal reflux disease) 2003  . Hypertension 2003  . Impotence of organic origin 1999  . Lack of coordination September 07, 2007  . Lumbago 2003  . Migraine with aura, without mention of intractable migraine without mention of status migrainosus 1997  . Myalgia and myositis, unspecified 1999  . Other and unspecified hyperlipidemia 2003  . Other disorders of vitreous 2003  . Other malaise and fatigue 2003  . Other seborrheic keratosis 2013  . Other seborrheic keratosis 04/13/09  . Pain in joint, site unspecified 04/13/12  . Personal history of fall 04/15/11  . Restless legs syndrome (RLS) 1997  . 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 Unspecified hearing loss 2003  . Unspecified tinnitus 04/15/11     Past Surgical History:  Procedure Laterality Date  . APPENDECTOMY  Age 70  . boil removed     Dr. Donne Hazel  . C3-4 anterior cervical surgery  1999  Dr. Hal Neer  . CARDIAC CATHETERIZATION  2001   Dr. Glade Lloyd  . COLONOSCOPY  07/20/07   Dr. Sharlett Iles  . EYE SURGERY Bilateral 2015   cataracts  .  LAMINOTOMY / EXCISION DISK POSTERIOR CERVICAL SPINE     Spinal Disk (?4-5)      Social History:  reports that he has never smoked. He has quit using smokeless tobacco. His smokeless tobacco use included Chew. He reports that he drinks alcohol. He reports that he does not use drugs.    Allergies  Allergen Reactions  . Aciphex [Rabeprazole Sodium]   . Aleve [Naproxen Sodium]   . Effexor [Venlafaxine Hcl]   . Serzone [Nefazodone]   . Vivactil [Protriptyline Hcl]     Family History  Problem Relation Age of Onset  . Heart disease Father   . Diabetes Neg Hx   . Colon cancer Neg Hx   . Colon polyps Neg Hx   . Esophageal cancer Neg Hx   . Kidney disease Neg Hx   . Gallbladder disease Neg Hx           Prior to Admission medications   Medication Sig Start Date End Date Taking? Authorizing Provider  aspirin 325 MG EC tablet Take 325 mg by mouth at bedtime.     Yes [provider]  celecoxib (CELEBREX) 200 MG capsule Take one capsule by mouth once daily for arthritis 09/30/16  Yes Estill Dooms, MD  HYDROcodone-acetaminophen [facility]) 10-325 MG tablet One up to 3 times daily for pain relief 09/21/16  Yes Estill Dooms, MD  lansoprazole (PREVACID) 30 MG capsule TAKE 1 CAPSULE BY MOUTH EVERY DAY TO REDUCE ACID REFLUX 06/08/14  Yes Reed, Tiffany L, DO  losartan (COZAAR) 100 MG tablet TAKE 1 TABLET (100 MG TOTAL) BY MOUTH DAILY. 09/25/16  Yes Estill Dooms, MD  metoprolol succinate (TOPROL-XL) 100 MG 24 hr tablet TAKE 1 TABLET BY MOUTH EVERY DAY 06/22/16  Yes Estill Dooms, MD  mometasone (NASONEX) 50 MCG/ACT nasal spray Place 2 sprays into the nose daily. 1 Puff in each nostril for allergies   Yes [provider]  nystatin cream (MYCOSTATIN) Apply twice daily to the rash 02/20/15  Yes Estill Dooms, MD  oxybutynin (DITROPAN) 5 MG tablet One up to 3 times daily to help bladder control 04/01/16  Yes Estill Dooms, MD  sildenafil (REVATIO) 20 MG tablet Take 2-5 tablets  prior to intercourse 09/29/16  Yes Estill Dooms, MD  naproxen sodium (ALEVE) 220 MG tablet Take one tablet by mouth with each meal for inflammation Patient not taking: Reported on 11/25/2016 08/06/14   Estill Dooms, MD     Physical Exam: Vitals:   11/25/16 1515 11/25/16 1520 11/25/16 1530 11/25/16 1700  BP: (!) 172/87 (!) 171/84 (!) 180/88 (!) 170/77  Pulse: 66 70 73 78  Resp: 19 (!) 25 14 16   Temp:    98.3 F (36.8 C)  TempSrc:    Oral  SpO2: 99% 99% 100% 99%  Weight:    69.1 kg (152 lb 4.8 oz)  Height:    5\' 7"  (1.702 m)        Vitals:   11/25/16 1515 11/25/16 1520 11/25/16 1530 11/25/16 1700  BP: (!) 172/87 (!) 171/84 (!) 180/88 (!) 170/77  Pulse: 66 70 73 78  Resp: 19 (!) 25 14 16   Temp:    98.3 F (36.8 C)  TempSrc:    Oral  SpO2: 99% 99% 100% 99%  Weight:    69.1 kg (152 lb 4.8 oz)  Height:    5\' 7"  (1.702 m)   Constitutional: NAD, calm, comfortable Eyes: PERRL, lids and conjunctivae normal ENMT: Mucous membranes are moist. Posterior pharynx clear of any exudate or lesions.Normal dentition.  Neck: normal, supple, no masses, no thyromegaly Respiratory: clear to auscultation bilaterally, no wheezing, no crackles. Normal respiratory effort. No accessory muscle use.  Cardiovascular: Regular rate and rhythm, no murmurs / rubs / gallops. No extremity edema. 2+ pedal pulses. No carotid bruits.  Abdomen: no tenderness, no masses palpated. No hepatosplenomegaly. Bowel sounds positive.  Musculoskeletal: no clubbing / cyanosis. No joint deformity upper and lower extremities. Good ROM, no contractures. Normal muscle tone.  Skin: no rashes, lesions, ulcers. No induration Neurologic: CN 2-12 grossly intact. Sensation intact, DTR normal. Strength 5/5 in all 4. He exhibits normal muscle tone. Coordination abnormal Psychiatric: Normal judgment and insight. Alert and oriented x 3. Normal mood.     Labs on Admission: I have personally reviewed following labs and imaging  studies  CBC:  Recent Labs Lab 11/25/16 0840  WBC 6.1  HGB 15.8  HCT 45.1  MCV 87.7  PLT 518    Basic Metabolic Panel:  Recent Labs Lab 11/25/16 0840  NA 138  K 3.9  CL 105  CO2 25  GLUCOSE 141*  BUN 15  CREATININE 1.08  CALCIUM 9.2    GFR: Estimated Creatinine Clearance: 55.3 mL/min (by C-G formula based on SCr of 1.08 mg/dL).  Liver Function Tests: No results for input(s): AST, ALT, ALKPHOS, BILITOT, PROT, ALBUMIN in the last 168 hours. No results for input(s): LIPASE, AMYLASE in the last 168 hours. No results for input(s): AMMONIA in the last 168 hours.  Coagulation Profile:  Recent Labs Lab 11/25/16 0840  INR 0.94   No results for input(s): DDIMER in the last 72 hours.  Cardiac Enzymes:  Recent Labs Lab 11/25/16 0840  TROPONINI <0.03    BNP (last 3 results) No results for input(s): PROBNP in the last 8760 hours.  HbA1C: No results for input(s): HGBA1C in the last 72 hours. No results found for: HGBA1C   CBG: No results for input(s): GLUCAP in the last 168 hours.  Lipid Profile: No results for input(s): CHOL, HDL, LDLCALC, TRIG, CHOLHDL, LDLDIRECT in the last 72 hours.  Thyroid Function Tests: No results for input(s): TSH, T4TOTAL, FREET4, T3FREE, THYROIDAB in the last 72 hours.  Anemia Panel: No results for input(s): VITAMINB12, FOLATE, FERRITIN, TIBC, IRON, RETICCTPCT in the last 72 hours.  Urine analysis: No results found for: COLORURINE, APPEARANCEUR, LABSPEC, PHURINE, GLUCOSEU, HGBUR, BILIRUBINUR, KETONESUR, PROTEINUR, UROBILINOGEN, NITRITE, LEUKOCYTESUR  Sepsis Labs: @LABRCNTIP (procalcitonin:4,lacticidven:4) )No results found for this or any previous visit (from the past 240 hour(s)).       Radiological Exams on Admission: Dg Chest 2 View  Result Date: 11/25/2016 CLINICAL DATA:  Chest pain radiating to the back and both arms associated with shortness of breath and nausea. EXAM: CHEST  2 VIEW COMPARISON:  Chest x-ray of  March 03, 2011 FINDINGS: The lungs are adequately inflated and clear. The heart and pulmonary vascularity are normal. The mediastinum is normal in width. There is no pleural effusion. The bony thorax exhibits no acute abnormality. IMPRESSION: There is no acute cardiopulmonary abnormality. Electronically Signed   By: David  Martinique M.D.   On: 11/25/2016 09:22   Ct Angio Chest Aorta W And/or Wo Contrast  Result Date: 11/25/2016 CLINICAL DATA:  Chest pain and shortness of breath EXAM:  CT ANGIOGRAPHY CHEST, ABDOMEN AND PELVIS TECHNIQUE: Multidetector CT imaging through the chest, abdomen and pelvis was performed using the standard protocol during bolus administration of intravenous contrast. Multiplanar reconstructed images and MIPs were obtained and reviewed to evaluate the vascular anatomy. CONTRAST:  100 mL Isovue 370. COMPARISON:  None. FINDINGS: CTA CHEST FINDINGS Cardiovascular: Thoracic aorta shows mild atherosclerotic changes without aneurysmal dilatation or dissection. The pulmonary artery shows no evidence of pulmonary emboli. No significant cardiac enlargement is seen. No coronary calcifications are noted. Mediastinum/Nodes: The thoracic inlet is within normal limits. No hilar or mediastinal adenopathy is noted. The esophagus is within normal limits. Lungs/Pleura: Lungs are clear. No pleural effusion or pneumothorax. Musculoskeletal: No chest wall abnormality. No acute or significant osseous findings. Review of the MIP images confirms the above findings. CTA ABDOMEN AND PELVIS FINDINGS VASCULAR Aorta: Mild atherosclerotic changes are noted without aneurysmal dilatation. Intimal defect is noted in the left lateral wall just below the renal artery is consistent with small dissection. This is not likely related to the patient's current symptomatology of chest pain. Celiac: Patent without evidence of aneurysm, dissection, vasculitis or significant stenosis. SMA: Patent without evidence of aneurysm,  dissection, vasculitis or significant stenosis. Renals: Will renal arteries are identified bilaterally. The accessory renal arteries bilaterally are diminutive and some as. IMA: Patent without evidence of aneurysm, dissection, vasculitis or significant stenosis. Iliacs: No significant atherosclerotic changes noted. Veins: Within normal limits. Review of the MIP images confirms the above findings. NON-VASCULAR Hepatobiliary: Diffuse fatty infiltration of the liver is noted. The gallbladder is within normal limits. Pancreas: Unremarkable. No pancreatic ductal dilatation or surrounding inflammatory changes. Spleen: Normal in size without focal abnormality. Adrenals/Urinary Tract: The adrenal glands are within normal limits. The kidneys demonstrate no renal calculi or obstructive changes. A few scattered cysts are noted. The bladder is well distended. Stomach/Bowel: Scattered diverticular change of the colon is noted without evidence of diverticulitis. The appendix has been surgically removed. No obstructive or inflammatory changes are noted. Lymphatic: No significant lymphadenopathy is noted. Reproductive: Prostate is unremarkable. Other: No abdominal wall hernia or abnormality. No abdominopelvic ascites. Musculoskeletal: Degenerative changes of lumbar spine are noted. No acute abnormality is seen. Review of the MIP images confirms the above findings. IMPRESSION: No evidence of pulmonary emboli. No evidence of thoracic aortic dissection. 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 This is of uncertain chronicity but of likely to correspond with the patient's current symptomatology of chest pain. Chronic changes as described above. Electronically Signed   By: Inez Catalina M.D.   On: 11/25/2016 11:37   Ct Angio Abd/pel W And/or Wo Contrast  Result Date: 11/25/2016 CLINICAL DATA:  Chest pain and shortness of breath EXAM: CT ANGIOGRAPHY CHEST, ABDOMEN AND PELVIS  TECHNIQUE: Multidetector CT imaging through the chest, abdomen and pelvis was performed using the standard protocol during bolus administration of intravenous contrast. Multiplanar reconstructed images and MIPs were obtained and reviewed to evaluate the vascular anatomy. CONTRAST:  100 mL Isovue 370. COMPARISON:  None. FINDINGS: CTA CHEST FINDINGS Cardiovascular: Thoracic aorta shows mild atherosclerotic changes without aneurysmal dilatation or dissection. The pulmonary artery shows no evidence of pulmonary emboli. No significant cardiac enlargement is seen. No coronary calcifications are noted. Mediastinum/Nodes: The thoracic inlet is within normal limits. No hilar or mediastinal adenopathy is noted. The esophagus is within normal limits. Lungs/Pleura: Lungs are clear. No pleural effusion or pneumothorax. Musculoskeletal: No chest wall abnormality. No acute or significant osseous findings. Review of  the MIP images confirms the above findings. CTA ABDOMEN AND PELVIS FINDINGS VASCULAR Aorta: Mild atherosclerotic changes are noted without aneurysmal dilatation. Intimal defect is noted in the left lateral wall just below the renal artery is consistent with small dissection. This is not likely related to the patient's current symptomatology of chest pain. Celiac: Patent without evidence of aneurysm, dissection, vasculitis or significant stenosis. SMA: Patent without evidence of aneurysm, dissection, vasculitis or significant stenosis. Renals: Will renal arteries are identified bilaterally. The accessory renal arteries bilaterally are diminutive and some as. IMA: Patent without evidence of aneurysm, dissection, vasculitis or significant stenosis. Iliacs: No significant atherosclerotic changes noted. Veins: Within normal limits. Review of the MIP images confirms the above findings. NON-VASCULAR Hepatobiliary: Diffuse fatty infiltration of the liver is noted. The gallbladder is within normal limits. Pancreas: Unremarkable.  No pancreatic ductal dilatation or surrounding inflammatory changes. Spleen: Normal in size without focal abnormality. Adrenals/Urinary Tract: The adrenal glands are within normal limits. The kidneys demonstrate no renal calculi or obstructive changes. A few scattered cysts are noted. The bladder is well distended. Stomach/Bowel: Scattered diverticular change of the colon is noted without evidence of diverticulitis. The appendix has been surgically removed. No obstructive or inflammatory changes are noted. Lymphatic: No significant lymphadenopathy is noted. Reproductive: Prostate is unremarkable. Other: No abdominal wall hernia or abnormality. No abdominopelvic ascites. Musculoskeletal: Degenerative changes of lumbar spine are noted. No acute abnormality is seen. Review of the MIP images confirms the above findings. IMPRESSION: No evidence of pulmonary emboli. No evidence of thoracic aortic dissection. 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 This is of uncertain chronicity but of likely to correspond with the patient's current symptomatology of chest pain. Chronic changes as described above. Electronically Signed   By: Inez Catalina M.D.   On: 11/25/2016 11:37   Dg Chest 2 View  Result Date: 11/25/2016 CLINICAL DATA:  Chest pain radiating to the back and both arms associated with shortness of breath and nausea. EXAM: CHEST  2 VIEW COMPARISON:  Chest x-ray of March 03, 2011 FINDINGS: The lungs are adequately inflated and clear. The heart and pulmonary vascularity are normal. The mediastinum is normal in width. There is no pleural effusion. The bony thorax exhibits no acute abnormality. IMPRESSION: There is no acute cardiopulmonary abnormality. Electronically Signed   By: David  Martinique M.D.   On: 11/25/2016 09:22   Ct Angio Chest Aorta W And/or Wo Contrast  Result Date: 11/25/2016 CLINICAL DATA:  Chest pain and shortness of breath EXAM: CT ANGIOGRAPHY  CHEST, ABDOMEN AND PELVIS TECHNIQUE: Multidetector CT imaging through the chest, abdomen and pelvis was performed using the standard protocol during bolus administration of intravenous contrast. Multiplanar reconstructed images and MIPs were obtained and reviewed to evaluate the vascular anatomy. CONTRAST:  100 mL Isovue 370. COMPARISON:  None. FINDINGS: CTA CHEST FINDINGS Cardiovascular: Thoracic aorta shows mild atherosclerotic changes without aneurysmal dilatation or dissection. The pulmonary artery shows no evidence of pulmonary emboli. No significant cardiac enlargement is seen. No coronary calcifications are noted. Mediastinum/Nodes: The thoracic inlet is within normal limits. No hilar or mediastinal adenopathy is noted. The esophagus is within normal limits. Lungs/Pleura: Lungs are clear. No pleural effusion or pneumothorax. Musculoskeletal: No chest wall abnormality. No acute or significant osseous findings. Review of the MIP images confirms the above findings. CTA ABDOMEN AND PELVIS FINDINGS VASCULAR Aorta: Mild atherosclerotic changes are noted without aneurysmal dilatation. Intimal defect is noted in the left lateral wall just below  the renal artery is consistent with small dissection. This is not likely related to the patient's current symptomatology of chest pain. Celiac: Patent without evidence of aneurysm, dissection, vasculitis or significant stenosis. SMA: Patent without evidence of aneurysm, dissection, vasculitis or significant stenosis. Renals: Will renal arteries are identified bilaterally. The accessory renal arteries bilaterally are diminutive and some as. IMA: Patent without evidence of aneurysm, dissection, vasculitis or significant stenosis. Iliacs: No significant atherosclerotic changes noted. Veins: Within normal limits. Review of the MIP images confirms the above findings. NON-VASCULAR Hepatobiliary: Diffuse fatty infiltration of the liver is noted. The gallbladder is within normal  limits. Pancreas: Unremarkable. No pancreatic ductal dilatation or surrounding inflammatory changes. Spleen: Normal in size without focal abnormality. Adrenals/Urinary Tract: The adrenal glands are within normal limits. The kidneys demonstrate no renal calculi or obstructive changes. A few scattered cysts are noted. The bladder is well distended. Stomach/Bowel: Scattered diverticular change of the colon is noted without evidence of diverticulitis. The appendix has been surgically removed. No obstructive or inflammatory changes are noted. Lymphatic: No significant lymphadenopathy is noted. Reproductive: Prostate is unremarkable. Other: No abdominal wall hernia or abnormality. No abdominopelvic ascites. Musculoskeletal: Degenerative changes of lumbar spine are noted. No acute abnormality is seen. Review of the MIP images confirms the above findings. IMPRESSION: No evidence of pulmonary emboli. No evidence of thoracic aortic dissection. 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 This is of uncertain chronicity but of likely to correspond with the patient's current symptomatology of chest pain. Chronic changes as described above. Electronically Signed   By: Inez Catalina M.D.   On: 11/25/2016 11:37   Ct Angio Abd/pel W And/or Wo Contrast  Result Date: 11/25/2016 CLINICAL DATA:  Chest pain and shortness of breath EXAM: CT ANGIOGRAPHY CHEST, ABDOMEN AND PELVIS TECHNIQUE: Multidetector CT imaging through the chest, abdomen and pelvis was performed using the standard protocol during bolus administration of intravenous contrast. Multiplanar reconstructed images and MIPs were obtained and reviewed to evaluate the vascular anatomy. CONTRAST:  100 mL Isovue 370. COMPARISON:  None. FINDINGS: CTA CHEST FINDINGS Cardiovascular: Thoracic aorta shows mild atherosclerotic changes without aneurysmal dilatation or dissection. The pulmonary artery shows no evidence of pulmonary  emboli. No significant cardiac enlargement is seen. No coronary calcifications are noted. Mediastinum/Nodes: The thoracic inlet is within normal limits. No hilar or mediastinal adenopathy is noted. The esophagus is within normal limits. Lungs/Pleura: Lungs are clear. No pleural effusion or pneumothorax. Musculoskeletal: No chest wall abnormality. No acute or significant osseous findings. Review of the MIP images confirms the above findings. CTA ABDOMEN AND PELVIS FINDINGS VASCULAR Aorta: Mild atherosclerotic changes are noted without aneurysmal dilatation. Intimal defect is noted in the left lateral wall just below the renal artery is consistent with small dissection. This is not likely related to the patient's current symptomatology of chest pain. Celiac: Patent without evidence of aneurysm, dissection, vasculitis or significant stenosis. SMA: Patent without evidence of aneurysm, dissection, vasculitis or significant stenosis. Renals: Will renal arteries are identified bilaterally. The accessory renal arteries bilaterally are diminutive and some as. IMA: Patent without evidence of aneurysm, dissection, vasculitis or significant stenosis. Iliacs: No significant atherosclerotic changes noted. Veins: Within normal limits. Review of the MIP images confirms the above findings. NON-VASCULAR Hepatobiliary: Diffuse fatty infiltration of the liver is noted. The gallbladder is within normal limits. Pancreas: Unremarkable. No pancreatic ductal dilatation or surrounding inflammatory changes. Spleen: Normal in size without focal abnormality. Adrenals/Urinary Tract: The adrenal glands are within  normal limits. The kidneys demonstrate no renal calculi or obstructive changes. A few scattered cysts are noted. The bladder is well distended. Stomach/Bowel: Scattered diverticular change of the colon is noted without evidence of diverticulitis. The appendix has been surgically removed. No obstructive or inflammatory changes are noted.  Lymphatic: No significant lymphadenopathy is noted. Reproductive: Prostate is unremarkable. Other: No abdominal wall hernia or abnormality. No abdominopelvic ascites. Musculoskeletal: Degenerative changes of lumbar spine are noted. No acute abnormality is seen. Review of the MIP images confirms the above findings. IMPRESSION: No evidence of pulmonary emboli. No evidence of thoracic aortic dissection. 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 This is of uncertain chronicity but of likely to correspond with the patient's current symptomatology of chest pain. Chronic changes as described above. Electronically Signed   By: Inez Catalina M.D.   On: 11/25/2016 11:37      EKG: Independently reviewed. 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 Oneida Alar, vascular surgery-no intervention needed at this time Patient Notes symptoms being very similar to exacerbation of reflux symptoms a few years ago-states his symptoms were relieved with GI cocktail Continue to cycle cardiac enzymes Continue to observe on telemetry 2-D echo to rule out wall motion abnormalities NPO , call cardiology for possible stress test in a.m. Also check hepatic panel, lipase to complete workup, rule out GI issues  Spinocerebellar disease Patient is followed at [facility] Has concomitant dysarthria and abnormal gait and dysphagia     Hypertension-Continue home medications Prn hydralazine to maintain systolic blood pressure less than 160       GERD (gastroesophageal reflux disease)-Continue PPI, GI  cocktail as needed    Dyslipidemia-Check lipid panel     DVT prophylaxis: Lovenox       consults called:Vascular surgery  Family Communication: Admission, patients condition and plan of care including tests being ordered have been discussed with the patient  who indicates understanding and agree with the plan and Code Status  Admission status:Observation  Disposition plan: Further plan will depend as patient's clinical course evolves and further radiologic and laboratory data become available. Likely home when stable      Reyne Dumas MD [facility] Pager [phone]  If 7PM-7AM, please contact night-coverage [email or web address] Password [facility]  11/25/2016, 5:28 PM

## 2016-11-25 NOTE — ED Notes (Signed)
Pt states he is hungry and has some heartburn.  MD ok with snack.  Pt given graham crackers with peanut butter and a coke.  Pt tolerated well.  Heartburn relieved.

## 2016-11-25 NOTE — ED Triage Notes (Signed)
Pt having some chest pain that seems to radiate to the back and the posterior aspect of both arms.  Some sob and nausea.  No leg pain.  Pt states he does have GERD but it feels a little different.  Pt does have some slurred speech and weakness but pt states that is baseline for him.

## 2016-11-25 NOTE — ED Provider Notes (Signed)
[facility] DEPT [facility] Provider Note   CSN: [id number] Arrival date & time: 11/25/16  3419     History   Chief Complaint Chief Complaint  Patient presents with  . Chest Pain    HPI OLUWATOMISIN DEMAN is a 76 y.o. male.  The history is provided by the patient and medical records. No language interpreter was used.  Chest Pain   This is a new problem. The current episode started yesterday. The problem occurs constantly. The problem has been gradually improving. The pain is present in the substernal region. The pain is at a severity of 5/10. The pain is moderate. The quality of the pain is described as pressure-like. The pain radiates to the upper back, left arm and right arm. Duration of episode(s) is 2 days. Associated symptoms include back pain, nausea and shortness of breath. Pertinent negatives include no abdominal pain, no cough, no diaphoresis, no fever, no malaise/fatigue, no near-syncope, no numbness, no palpitations and no vomiting. He has tried nothing for the symptoms. The treatment provided no relief.  His past medical history is significant for hypertension.  Pertinent negatives for past medical history include no PE.  Pertinent negatives for family medical history include: no early MI.    Past Medical History:  Diagnosis Date  . Abnormality of gait September 07 2007  . Allergic rhinitis, cause unspecified 2007  . Arthritis   . Ataxia   . Cervicalgia February 26, 2006  . Chest pain   . Cramp of limb 10/21/11  . Degeneration of intervertebral disc, site unspecified 1998  . 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 Diaphragmatic hernia without mention of obstruction or gangrene 1982   Hiatal hernia  . Diplopia 2006  . Disturbance of skin sensation 2003  . Diverticulosis of colon (without mention of hemorrhage) 1997  . Dizziness and giddiness 2001  . Dysphagia, unspecified(787.20) 1999 and  . Esophagitis, unspecified 1997  . GERD  (gastroesophageal reflux disease) 2003  . Hypertension 2003  . Impotence of organic origin 1999  . Lack of coordination September 07, 2007  . Lumbago 2003  . Migraine with aura, without mention of intractable migraine without mention of status migrainosus 1997  . Myalgia and myositis, unspecified 1999  . Other and unspecified hyperlipidemia 2003  . Other disorders of vitreous 2003  . Other malaise and fatigue 2003  . Other seborrheic keratosis 2013  . Other seborrheic keratosis 04/13/09  . Pain in joint, site unspecified 04/13/12  . Personal history of fall 04/15/11  . Restless legs syndrome (RLS) 1997  . 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 Unspecified hearing loss 2003  . Unspecified tinnitus 04/15/11    Patient Active Problem List   Diagnosis Date Noted  . Cough 04/01/2016  . Urgency incontinence 04/01/2016  . Decubitus ulcer of sacral region, stage 2 04/01/2016  . History of fall 08/21/2015  . 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 Internal hemorrhoids without complication 62/22/9798  . Arthritis 02/21/2014  . Dysarthria 02/21/2014  . Seborrheic keratosis 02/21/2014  . 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 Tinnitus 04/12/2013  . 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[place]) 10/16/2012  . 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 Hypertension   . GERD (gastroesophageal reflux disease)   . Abnormality of gait   . Dyslipidemia   . Lumbago   . Dysphagia     Past Surgical History:  Procedure Laterality Date  . APPENDECTOMY  Age 46  . boil removed  Dr. Donne Hazel  . C3-4 anterior cervical surgery  1999   Dr. Hal Neer  . CARDIAC CATHETERIZATION  2001   Dr. Glade Lloyd  . COLONOSCOPY  07/20/07   Dr. Sharlett Iles  . EYE SURGERY Bilateral 2015   cataracts  . LAMINOTOMY / EXCISION DISK POSTERIOR CERVICAL SPINE     Spinal Disk (?4-5)       Home Medications    Prior to Admission medications   Medication Sig Start Date  End Date Taking? Authorizing Provider  metoprolol succinate (TOPROL-XL) 100 MG 24 hr tablet TAKE 1 TABLET BY MOUTH EVERY DAY 06/22/16  Yes Estill Dooms, MD  oxybutynin (DITROPAN) 5 MG tablet One up to 3 times daily to help bladder control 04/01/16  Yes Estill Dooms, MD  aspirin 325 MG EC tablet Take 325 mg by mouth at bedtime.      [provider]  celecoxib (CELEBREX) 200 MG capsule Take one capsule by mouth once daily for arthritis 09/30/16   Estill Dooms, MD  HYDROcodone-acetaminophen [facility]) 10-325 MG tablet One up to 3 times daily for pain relief 09/21/16   Estill Dooms, MD  lansoprazole (PREVACID) 30 MG capsule TAKE 1 CAPSULE BY MOUTH EVERY DAY TO REDUCE ACID REFLUX 06/08/14   Reed, Tiffany L, DO  losartan (COZAAR) 100 MG tablet TAKE 1 TABLET (100 MG TOTAL) BY MOUTH DAILY. 09/25/16   Estill Dooms, MD  mometasone (NASONEX) 50 MCG/ACT nasal spray Place 2 sprays into the nose daily. 1 Puff in each nostril for allergies    [provider]  naproxen sodium (ALEVE) 220 MG tablet Take one tablet by mouth with each meal for inflammation 08/06/14   Estill Dooms, MD  nystatin cream (MYCOSTATIN) Apply twice daily to the rash 02/20/15   Estill Dooms, MD  sildenafil (REVATIO) 20 MG tablet Take 2-5 tablets prior to intercourse 09/29/16   Estill Dooms, MD    Family History Family History  Problem Relation Age of Onset  . Heart disease Father   . Diabetes Neg Hx   . Colon cancer Neg Hx   . Colon polyps Neg Hx   . Esophageal cancer Neg Hx   . Kidney disease Neg Hx   . Gallbladder disease Neg Hx     Social History Social History  Substance Use Topics  . Smoking status: Never Smoker  . Smokeless tobacco: Former Systems developer    Types: Chew  . Alcohol use 0.0 oz/week     Comment: Occas. Wine      Allergies   Aciphex [rabeprazole sodium]; Aleve [naproxen sodium]; Effexor [venlafaxine hcl]; Serzone [nefazodone]; and Vivactil [protriptyline hcl]   Review of Systems Review  of Systems  Constitutional: Negative for appetite change, chills, diaphoresis, fever and malaise/fatigue.  HENT: Negative for congestion and rhinorrhea.   Eyes: Negative for visual disturbance.  Respiratory: Positive for shortness of breath. Negative for cough, chest tightness, wheezing and stridor.   Cardiovascular: Positive for chest pain. Negative for palpitations, leg swelling and near-syncope.  Gastrointestinal: Positive for nausea. Negative for abdominal pain, constipation, diarrhea and vomiting.  Genitourinary: Negative for flank pain.  Musculoskeletal: Positive for back pain.  Neurological: Negative for light-headedness and numbness.  Psychiatric/Behavioral: Negative for agitation.  All other systems reviewed and are negative.    Physical Exam Updated Vital Signs BP (!) 170/133 (BP Location: Right Arm) Comment: RN NOTIFIED   Pulse 71   Temp 98.7 F (37.1 C) (Oral)   Resp 16   Ht 5\' 7"  (  1.702 m)   Wt 77.1 kg (170 lb)   SpO2 96%   BMI 26.63 kg/m   Physical Exam  Constitutional: He appears well-developed and well-nourished.  HENT:  Head: Normocephalic and atraumatic.  Mouth/Throat: Oropharynx is clear and moist. No oropharyngeal exudate.  Eyes: Conjunctivae and EOM are normal. Pupils are equal, round, and reactive to light.  Neck: Normal range of motion. Neck supple.  Cardiovascular: Normal rate, regular rhythm and intact distal pulses.   No murmur heard. Pulmonary/Chest: Effort normal and breath sounds normal. No stridor. No respiratory distress. He has no wheezes. He has no rales. He exhibits no tenderness.  Abdominal: Soft. There is no tenderness.  Musculoskeletal: He exhibits no edema or tenderness.  Neurological: He is alert. No sensory deficit. He exhibits normal muscle tone. Coordination abnormal.  Skin: Skin is warm and dry. Capillary refill takes less than 2 seconds. No erythema. No pallor.  Psychiatric: He has a normal mood and affect.  Nursing note and  vitals reviewed.    ED Treatments / Results  Labs (all labs ordered are listed, but only abnormal results are displayed) Labs Reviewed  BASIC METABOLIC PANEL - Abnormal; Notable for the following:       Result Value   Glucose, Bld 141 (*)    All other components within normal limits  CBC  TROPONIN I  PROTIME-INR    EKG  EKG Interpretation  Date/Time:  Wednesday November 25 2016 08:42:09 EDT Ventricular Rate:  70 PR Interval:    QRS Duration: 94 QT Interval:  415 QTC Calculation: 448 R Axis:   21 Text Interpretation:  Sinus rhythm RSR' in V1 or V2, probably normal variant Baseline wander in lead(s) V5 When compared to prior, no significant changes seen.  No STEMI Confirmed by Antony Blackbird [id number]) on 11/25/2016 9:06:55 AM       Radiology Dg Chest 2 View  Result Date: 11/25/2016 CLINICAL DATA:  Chest pain radiating to the back and both arms associated with shortness of breath and nausea. EXAM: CHEST  2 VIEW COMPARISON:  Chest x-ray of March 03, 2011 FINDINGS: The lungs are adequately inflated and clear. The heart and pulmonary vascularity are normal. The mediastinum is normal in width. There is no pleural effusion. The bony thorax exhibits no acute abnormality. IMPRESSION: There is no acute cardiopulmonary abnormality. Electronically Signed   By: David  Martinique M.D.   On: 11/25/2016 09:22   Ct Angio Chest Aorta W And/or Wo Contrast  Result Date: 11/25/2016 CLINICAL DATA:  Chest pain and shortness of breath EXAM: CT ANGIOGRAPHY CHEST, ABDOMEN AND PELVIS TECHNIQUE: Multidetector CT imaging through the chest, abdomen and pelvis was performed using the standard protocol during bolus administration of intravenous contrast. Multiplanar reconstructed images and MIPs were obtained and reviewed to evaluate the vascular anatomy. CONTRAST:  100 mL Isovue 370. COMPARISON:  None. FINDINGS: CTA CHEST FINDINGS Cardiovascular: Thoracic aorta shows mild atherosclerotic changes without aneurysmal  dilatation or dissection. The pulmonary artery shows no evidence of pulmonary emboli. No significant cardiac enlargement is seen. No coronary calcifications are noted. Mediastinum/Nodes: The thoracic inlet is within normal limits. No hilar or mediastinal adenopathy is noted. The esophagus is within normal limits. Lungs/Pleura: Lungs are clear. No pleural effusion or pneumothorax. Musculoskeletal: No chest wall abnormality. No acute or significant osseous findings. Review of the MIP images confirms the above findings. CTA ABDOMEN AND PELVIS FINDINGS VASCULAR Aorta: Mild atherosclerotic changes are noted without aneurysmal dilatation. Intimal defect is noted in the left lateral wall just  below the renal artery is consistent with small dissection. This is not likely related to the patient's current symptomatology of chest pain. Celiac: Patent without evidence of aneurysm, dissection, vasculitis or significant stenosis. SMA: Patent without evidence of aneurysm, dissection, vasculitis or significant stenosis. Renals: Will renal arteries are identified bilaterally. The accessory renal arteries bilaterally are diminutive and some as. IMA: Patent without evidence of aneurysm, dissection, vasculitis or significant stenosis. Iliacs: No significant atherosclerotic changes noted. Veins: Within normal limits. Review of the MIP images confirms the above findings. NON-VASCULAR Hepatobiliary: Diffuse fatty infiltration of the liver is noted. The gallbladder is within normal limits. Pancreas: Unremarkable. No pancreatic ductal dilatation or surrounding inflammatory changes. Spleen: Normal in size without focal abnormality. Adrenals/Urinary Tract: The adrenal glands are within normal limits. The kidneys demonstrate no renal calculi or obstructive changes. A few scattered cysts are noted. The bladder is well distended. Stomach/Bowel: Scattered diverticular change of the colon is noted without evidence of diverticulitis. The appendix  has been surgically removed. No obstructive or inflammatory changes are noted. Lymphatic: No significant lymphadenopathy is noted. Reproductive: Prostate is unremarkable. Other: No abdominal wall hernia or abnormality. No abdominopelvic ascites. Musculoskeletal: Degenerative changes of lumbar spine are noted. No acute abnormality is seen. Review of the MIP images confirms the above findings. IMPRESSION: No evidence of pulmonary emboli. No evidence of thoracic aortic dissection. 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 This is of uncertain chronicity but of likely to correspond with the patient's current symptomatology of chest pain. Chronic changes as described above. Electronically Signed   By: Inez Catalina M.D.   On: 11/25/2016 11:37   Ct Angio Abd/pel W And/or Wo Contrast  Result Date: 11/25/2016 CLINICAL DATA:  Chest pain and shortness of breath EXAM: CT ANGIOGRAPHY CHEST, ABDOMEN AND PELVIS TECHNIQUE: Multidetector CT imaging through the chest, abdomen and pelvis was performed using the standard protocol during bolus administration of intravenous contrast. Multiplanar reconstructed images and MIPs were obtained and reviewed to evaluate the vascular anatomy. CONTRAST:  100 mL Isovue 370. COMPARISON:  None. FINDINGS: CTA CHEST FINDINGS Cardiovascular: Thoracic aorta shows mild atherosclerotic changes without aneurysmal dilatation or dissection. The pulmonary artery shows no evidence of pulmonary emboli. No significant cardiac enlargement is seen. No coronary calcifications are noted. Mediastinum/Nodes: The thoracic inlet is within normal limits. No hilar or mediastinal adenopathy is noted. The esophagus is within normal limits. Lungs/Pleura: Lungs are clear. No pleural effusion or pneumothorax. Musculoskeletal: No chest wall abnormality. No acute or significant osseous findings. Review of the MIP images confirms the above findings. CTA ABDOMEN AND PELVIS  FINDINGS VASCULAR Aorta: Mild atherosclerotic changes are noted without aneurysmal dilatation. Intimal defect is noted in the left lateral wall just below the renal artery is consistent with small dissection. This is not likely related to the patient's current symptomatology of chest pain. Celiac: Patent without evidence of aneurysm, dissection, vasculitis or significant stenosis. SMA: Patent without evidence of aneurysm, dissection, vasculitis or significant stenosis. Renals: Will renal arteries are identified bilaterally. The accessory renal arteries bilaterally are diminutive and some as. IMA: Patent without evidence of aneurysm, dissection, vasculitis or significant stenosis. Iliacs: No significant atherosclerotic changes noted. Veins: Within normal limits. Review of the MIP images confirms the above findings. NON-VASCULAR Hepatobiliary: Diffuse fatty infiltration of the liver is noted. The gallbladder is within normal limits. Pancreas: Unremarkable. No pancreatic ductal dilatation or surrounding inflammatory changes. Spleen: Normal in size without focal abnormality. Adrenals/Urinary Tract: The adrenal glands are  within normal limits. The kidneys demonstrate no renal calculi or obstructive changes. A few scattered cysts are noted. The bladder is well distended. Stomach/Bowel: Scattered diverticular change of the colon is noted without evidence of diverticulitis. The appendix has been surgically removed. No obstructive or inflammatory changes are noted. Lymphatic: No significant lymphadenopathy is noted. Reproductive: Prostate is unremarkable. Other: No abdominal wall hernia or abnormality. No abdominopelvic ascites. Musculoskeletal: Degenerative changes of lumbar spine are noted. No acute abnormality is seen. Review of the MIP images confirms the above findings. IMPRESSION: No evidence of pulmonary emboli. No evidence of thoracic aortic dissection. Small focal intimal defect in the infrarenal abdominal aorta  consistent with focal dissection. This is of uncertain chronicity but of likely to correspond with the patient's current symptomatology of chest pain. Chronic changes as described above. Electronically Signed   By: Inez Catalina M.D.   On: 11/25/2016 11:37    Procedures Procedures (including critical care time)  Medications Ordered in ED Medications  acetaminophen (TYLENOL) tablet 650 mg (not administered)  ondansetron (ZOFRAN) injection 4 mg (not administered)  enoxaparin (LOVENOX) injection 40 mg (not administered)  morphine 2 MG/ML injection 2 mg (not administered)  gi cocktail (Maalox,Lidocaine,Donnatal) (not administered)  hydrALAZINE (APRESOLINE) injection 10 mg (not administered)  aspirin chewable tablet 324 mg (324 mg Oral Given 11/25/16 0923)  gi cocktail (Maalox,Lidocaine,Donnatal) (30 mLs Oral Given 11/25/16 0923)  iopamidol (ISOVUE-370) 76 % injection 100 mL (100 mLs Intravenous Contrast Given 11/25/16 1110)  hydrALAZINE (APRESOLINE) injection 10 mg (10 mg Intravenous Given 11/25/16 1454)     Initial Impression / Assessment and Plan / ED Course  I have reviewed the triage vital signs and the nursing notes.  Pertinent labs & imaging results that were available during my care of the patient were reviewed by me and considered in my medical decision making (see chart for details).     CECILIA NISHIKAWA is a 76 y.o. male with a past medical history significant for spinocerebellar disease, hypertension, and GERD who presents with chest pain. Patient says that he developed pain for the last few days that he describes as a pressure in his chest. He describes it in his chest radiating to his back and his bilateral arms. He reports associated shortness of breath and nausea. He says it is a 5 out of 10 in severity and moderate. He denies a pleuritic component or exertional component to the pain. He says that he has had no trauma. He says that he had similar pain years ago but has not had it  since. He says it feels different than reflux pain. Patient denies any leg pain or leg swelling. He denies any fevers, chills, congestion, or cough. He says nothing has made his pain better. He denies associated syncope, palpitations, or diaphoresis.  History and exam are seen above. On arrival, patient's blood pressure is elevated. Patient reports that he took blood pressure medicine just prior to coming. He had symmetric pulses in bilateral upper extremities. He had clear lung sounds. No rales or rhonchi. Chest was nontender to palpation. Abdomen nontender. No CVA tenderness. Patient had some ataxia with finger-nose-finger in the left hand. Patient says his speech and coordination problems are unchanged from baseline.  EKG showed no changes from prior with no evidence of acute ischemia. Chest x-ray unremarkable. Initial troponin negative. CBC and BMP were reassuring.  Given the patient's description of chest pain going to his back and arms as well as his elevated blood pressure, patient will have  imaging to rule out dissection. CTA ordered.  Heart score calculated as a 5. Will have shared decision-making conversation to discuss disposition after imaging is completed. Patient reports his pain is improved and he was given aspirin upon arrival. Patient also given GI cocktail.  1:53 PM Diagnostic testing results are seen above. CT a does not show pulmonary embolus. No thoracic dissection. There is one area of focal intimal disruption that the radiologist reports is consistent with focal dissection. It is of uncertain chronicity.  Patient's pain is now a 1 out of 10 however, given his heart score of 5 with his concerning story of pain and this discovery of focal dissection, patient will be admitted to hospitalist service for further management.  Patient given hydralazine at request of admitting team for blood pressure control.  Patient admitted in stable condition for further management.  Final  Clinical Impressions(s) / ED Diagnoses   Final diagnoses:  Precordial chest pain    Clinical Impression: 1. Precordial chest pain     Disposition: Admit to Hospitalist service    Solveig Fangman, Gwenyth Allegra, MD 11/25/16 1755

## 2016-11-26 ENCOUNTER — Observation Stay (HOSPITAL_BASED_OUTPATIENT_CLINIC_OR_DEPARTMENT_OTHER): Payer: Medicare Other

## 2016-11-26 DIAGNOSIS — R079 Chest pain, unspecified: Secondary | ICD-10-CM

## 2016-11-26 DIAGNOSIS — I7102 Dissection of abdominal aorta: Secondary | ICD-10-CM | POA: Diagnosis not present

## 2016-11-26 DIAGNOSIS — I10 Essential (primary) hypertension: Secondary | ICD-10-CM | POA: Diagnosis not present

## 2016-11-26 LAB — CBC
HCT: 43.4 % (ref 39.0–52.0)
Hemoglobin: 14.7 g/dL (ref 13.0–17.0)
MCH: 30.1 pg (ref 26.0–34.0)
MCHC: 33.9 g/dL (ref 30.0–36.0)
MCV: 88.8 fL (ref 78.0–100.0)
Platelets: 161 10*3/uL (ref 150–400)
RBC: 4.89 MIL/uL (ref 4.22–5.81)
RDW: 13.3 % (ref 11.5–15.5)
WBC: 7.6 10*3/uL (ref 4.0–10.5)

## 2016-11-26 LAB — COMPREHENSIVE METABOLIC PANEL
ALT: 15 U/L — ABNORMAL LOW (ref 17–63)
AST: 16 U/L (ref 15–41)
Albumin: 3.3 g/dL — ABNORMAL LOW (ref 3.5–5.0)
Alkaline Phosphatase: 120 U/L (ref 38–126)
Anion gap: 9 (ref 5–15)
BUN: 15 mg/dL (ref 6–20)
CHLORIDE: 107 mmol/L (ref 101–111)
CO2: 23 mmol/L (ref 22–32)
CREATININE: 0.91 mg/dL (ref 0.61–1.24)
Calcium: 9.1 mg/dL (ref 8.9–10.3)
Glucose, Bld: 93 mg/dL (ref 65–99)
POTASSIUM: 3.8 mmol/L (ref 3.5–5.1)
Sodium: 139 mmol/L (ref 135–145)
Total Bilirubin: 1.2 mg/dL (ref 0.3–1.2)
Total Protein: 5.4 g/dL — ABNORMAL LOW (ref 6.5–8.1)

## 2016-11-26 LAB — LIPID PANEL
CHOL/HDL RATIO: 3.9 ratio
CHOLESTEROL: 149 mg/dL (ref 0–200)
HDL: 38 mg/dL — ABNORMAL LOW (ref >40)
LDL Cholesterol: 81 mg/dL (ref 0–99)
TRIGLYCERIDES: 148 mg/dL (ref <150)
VLDL: 30 mg/dL (ref 0–40)

## 2016-11-26 LAB — NM MYOCAR MULTI W/SPECT W/WALL MOTION / EF
CSEPED: 4 min
Rest HR: 70 {beats}/min

## 2016-11-26 LAB — TROPONIN I
Troponin I: <0.03 ng/mL (ref <0.03)
Troponin I: <0.03 ng/mL (ref <0.03)

## 2016-11-26 MED ORDER — METOPROLOL TARTRATE 25 MG PO TABS: 25.0000 mg | ORAL_TABLET | Freq: Once | ORAL | Status: DC

## 2016-11-26 MED ORDER — TECHNETIUM TC 99M TETROFOSMIN IV KIT
30.0000 | PACK | Freq: Once | INTRAVENOUS | Status: AC | PRN
  Administered 2016-11-26: 30 via INTRAVENOUS

## 2016-11-26 MED ORDER — REGADENOSON 0.4 MG/5ML IV SOLN
INTRAVENOUS | Status: AC
  Administered 2016-11-26: 0.4 mg
  Filled 2016-11-26: qty 5

## 2016-11-26 MED ORDER — NYSTATIN 100000 UNIT/GM EX CREA
TOPICAL_CREAM | Freq: Two times a day (BID) | CUTANEOUS | Status: DC
  Administered 2016-11-26 – 2016-11-27 (×3): via TOPICAL
  Filled 2016-11-26: qty 15

## 2016-11-26 MED ORDER — TECHNETIUM TC 99M TETROFOSMIN IV KIT
10.0000 | PACK | Freq: Once | INTRAVENOUS | Status: AC | PRN
  Administered 2016-11-26: 10 via INTRAVENOUS

## 2016-11-26 NOTE — Progress Notes (Signed)
Patient back in room after stress test, very interested in eating, text page sent to Dr. Maudie Mercury for clearance to eat as order reads NPO until results of stress test are in.

## 2016-11-26 NOTE — Progress Notes (Signed)
Spoke with vascular ok to proceed with stress testing, appreciate input

## 2016-11-26 NOTE — Plan of Care (Signed)
Problem: Tissue Perfusion: Goal: Risk factors for ineffective tissue perfusion will decrease Outcome: Progressing Patient maintains oxygen level in the 90's on room air

## 2016-11-26 NOTE — Progress Notes (Signed)
Patient stable during7 a to 7 p shift, VSS.  No complaints of chest pain, mild shortness of breath with exertion but improved from admission.

## 2016-11-26 NOTE — Progress Notes (Signed)
Requested vascular surgery advice regarding Aortic Disection and whether or not ok to do stress testing.

## 2016-11-26 NOTE — Progress Notes (Signed)
Patient ID: Justin [name], male   DOB: 1941/03/10, 76 y.o.   MRN: [id number]                                                                PROGRESS NOTE                                                                                                                                                                                                             Patient Demographics:    Justin [name], is a 76 y.o. male, DOB - 01-24-1941, KHT:[id number]  Admit date - 11/25/2016   Admitting Physician Waldemar Dickens, MD  Outpatient Primary MD for the patient is Gayland Curry, DO  LOS - 0  Outpatient Specialists:     Chief Complaint  Patient presents with  . Chest Pain       Brief Narrative  76 year old male with a history of gastroesophageal reflux disease, hypertension, restless leg syndrome, spinocerebellar disease, who presents to the ER at [facility] with substernal chest pain, 5 and 10 in intensity, pressure-like quality with radiation into the upper back, bilateral arms 7 days, symptoms progressively getting worse, also made worse with exertion. No prior history of trauma. This morning patient also felt weak, nauseous, noted his blood pressure was running high ED course BP (!) 170/133   Pulse 71  On arrival blood pressure was elevated. EKG showed no changes, no evidence of acute ischemia, chest x-ray unremarkable. 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 Patient sent to [facility] for further evaluation of his chest pain.     Subjective:    Justin [name] today has no current cp . Pt states that cp left sided with radiation to the left arm, intermittent , worse with food, slight burning sensation.   Denies fever, chills, palp, cough , sob, orthopnea, pnd.   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 Oneida Alar, vascular surgery-no intervention needed at this time Patient Notes symptoms being very similar to exacerbation of reflux symptoms a few years ago-states his symptoms were relieved with GI cocktail Continue to cycle cardiac enzymes Continue to observe on telemetry 2-D echo to rule out wall motion abnormalities NPO ,  Cardiology consult  Spinocerebellar disease Patient is followed at [facility] Has concomitant dysarthria and abnormal gait and dysphagia     Hypertension-Continue home medications Prn hydralazine to maintain systolic blood pressure less than 160       GERD (gastroesophageal reflux disease)-Continue PPI, GI cocktail as needed    Dyslipidemia-Check lipid panel     DVT prophylaxis: Lovenox consults called:Vascular surgery  Family Communication:Admission, patients condition and plan of care including tests being ordered have been discussed with the patient who indicates understanding and agree with the plan and Code Status  Admission status:Observation  Disposition plan: Further plan will depend as patient's clinical course evolves and further radiologic and laboratory data become available. Likely home when stable        Lab Results  Component Value Date   PLT 161 11/26/2016      Anti-infectives    None        Objective:   Vitals:   11/25/16 1700 11/25/16 2041 11/26/16 0020 11/26/16 0514  BP: (!) 170/77 (!) 142/62 123/61 130/69  Pulse: 78 67 66 83  Resp: 16 16 16 16   Temp: 98.3 F (36.8 C) 98.4 F (36.9  C) 98.5 F (36.9 C) 98.4 F (36.9 C)  TempSrc: Oral Oral Oral Oral  SpO2: 99% 97% 95% 95%  Weight: 69.1 kg (152 lb 4.8 oz)   68.8 kg (151 lb 11.2 oz)  Height: 5\' 7"  (1.702 m)       Wt Readings from Last 3 Encounters:  11/26/16 68.8 kg (151 lb 11.2 oz)  09/29/16 73 kg (161 lb)  04/01/16 72.6 kg (160 lb)     Intake/Output Summary (Last 24 hours) at 11/26/16 0609 Last data filed at 11/26/16 0517  Gross per 24 hour  Intake              562 ml  Output             1350 ml  Net             -788 ml     Physical Exam  Awake Alert, Oriented X 3, No new F.N deficits, Normal affect [place].AT,PERRAL Supple Neck,No JVD, No cervical lymphadenopathy appriciated.  Symmetrical Chest wall movement, Good air movement bilaterally, CTAB RRR,No Gallops,Rubs or new Murmurs, No Parasternal Heave +ve B.Sounds, Abd Soft, No tenderness, No organomegaly appriciated, No rebound - guarding or rigidity. No Cyanosis, Clubbing or edema, No new Rash or bruise      Data Review:    CBC  Recent Labs Lab 11/25/16 0840 11/26/16 0531  WBC 6.1 7.6  HGB 15.8 14.7  HCT 45.1 43.4  PLT 183 161  MCV 87.7 88.8  MCH 30.7 30.1  MCHC 35.0 33.9  RDW 13.1 13.3    Chemistries   Recent Labs Lab 11/25/16 0840 11/25/16 1854  NA 138  --   K 3.9  --   CL 105  --   CO2 25  --   GLUCOSE 141*  --  BUN 15  --   CREATININE 1.08  --   CALCIUM 9.2  --   AST  --  19  ALT  --  17  ALKPHOS  --  133*  BILITOT  --  0.7   ------------------------------------------------------------------------------------------------------------------ No results for input(s): CHOL, HDL, LDLCALC, TRIG, CHOLHDL, LDLDIRECT in the last 72 hours.  No results found for: HGBA1C ------------------------------------------------------------------------------------------------------------------ No results for input(s): TSH, T4TOTAL, T3FREE, THYROIDAB in the last 72 hours.  Invalid input(s):  FREET3 ------------------------------------------------------------------------------------------------------------------ No results for input(s): VITAMINB12, FOLATE, FERRITIN, TIBC, IRON, RETICCTPCT in the last 72 hours.  Coagulation profile  Recent Labs Lab 11/25/16 0840  INR 0.94    No results for input(s): DDIMER in the last 72 hours.  Cardiac Enzymes  Recent Labs Lab 11/25/16 0840 11/25/16 1854 11/26/16 0007  TROPONINI <0.03 <0.03 <0.03   ------------------------------------------------------------------------------------------------------------------ No results found for: BNP  Inpatient Medications  Scheduled Meds: . enoxaparin (LOVENOX) injection  40 mg Subcutaneous Q24H  . fluticasone  1 spray Each Nare Daily  . losartan  100 mg Oral Daily  . metoprolol succinate  100 mg Oral Daily  . oxybutynin  5 mg Oral BID  . pantoprazole  40 mg Oral Daily   Continuous Infusions: PRN Meds:.acetaminophen, gi cocktail, hydrALAZINE, morphine injection, ondansetron (ZOFRAN) IV  Micro Results No results found for this or any previous visit (from the past 240 hour(s)).  Radiology Reports Dg Chest 2 View  Result Date: 11/25/2016 CLINICAL DATA:  Chest pain radiating to the back and both arms associated with shortness of breath and nausea. EXAM: CHEST  2 VIEW COMPARISON:  Chest x-ray of March 03, 2011 FINDINGS: The lungs are adequately inflated and clear. The heart and pulmonary vascularity are normal. The mediastinum is normal in width. There is no pleural effusion. The bony thorax exhibits no acute abnormality. IMPRESSION: There is no acute cardiopulmonary abnormality. Electronically Signed   By: David  Martinique M.D.   On: 11/25/2016 09:22   Ct Angio Chest Aorta W And/or Wo Contrast  Result Date: 11/25/2016 CLINICAL DATA:  Chest pain and shortness of breath EXAM: CT ANGIOGRAPHY CHEST, ABDOMEN AND PELVIS TECHNIQUE: Multidetector CT imaging through the chest, abdomen and pelvis was  performed using the standard protocol during bolus administration of intravenous contrast. Multiplanar reconstructed images and MIPs were obtained and reviewed to evaluate the vascular anatomy. CONTRAST:  100 mL Isovue 370. COMPARISON:  None. FINDINGS: CTA CHEST FINDINGS Cardiovascular: Thoracic aorta shows mild atherosclerotic changes without aneurysmal dilatation or dissection. The pulmonary artery shows no evidence of pulmonary emboli. No significant cardiac enlargement is seen. No coronary calcifications are noted. Mediastinum/Nodes: The thoracic inlet is within normal limits. No hilar or mediastinal adenopathy is noted. The esophagus is within normal limits. Lungs/Pleura: Lungs are clear. No pleural effusion or pneumothorax. Musculoskeletal: No chest wall abnormality. No acute or significant osseous findings. Review of the MIP images confirms the above findings. CTA ABDOMEN AND PELVIS FINDINGS VASCULAR Aorta: Mild atherosclerotic changes are noted without aneurysmal dilatation. Intimal defect is noted in the left lateral wall just below the renal artery is consistent with small dissection. This is not likely related to the patient's current symptomatology of chest pain. Celiac: Patent without evidence of aneurysm, dissection, vasculitis or significant stenosis. SMA: Patent without evidence of aneurysm, dissection, vasculitis or significant stenosis. Renals: Will renal arteries are identified bilaterally. The accessory renal arteries bilaterally are diminutive and some as. IMA: Patent without evidence of aneurysm, dissection, vasculitis or significant stenosis. Iliacs: No significant atherosclerotic changes  noted. Veins: Within normal limits. Review of the MIP images confirms the above findings. NON-VASCULAR Hepatobiliary: Diffuse fatty infiltration of the liver is noted. The gallbladder is within normal limits. Pancreas: Unremarkable. No pancreatic ductal dilatation or surrounding inflammatory changes. Spleen:  Normal in size without focal abnormality. Adrenals/Urinary Tract: The adrenal glands are within normal limits. The kidneys demonstrate no renal calculi or obstructive changes. A few scattered cysts are noted. The bladder is well distended. Stomach/Bowel: Scattered diverticular change of the colon is noted without evidence of diverticulitis. The appendix has been surgically removed. No obstructive or inflammatory changes are noted. Lymphatic: No significant lymphadenopathy is noted. Reproductive: Prostate is unremarkable. Other: No abdominal wall hernia or abnormality. No abdominopelvic ascites. Musculoskeletal: Degenerative changes of lumbar spine are noted. No acute abnormality is seen. Review of the MIP images confirms the above findings. IMPRESSION: No evidence of pulmonary emboli. No evidence of thoracic aortic dissection. 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 This is of uncertain chronicity but of likely to correspond with the patient's current symptomatology of chest pain. Chronic changes as described above. Electronically Signed   By: Inez Catalina M.D.   On: 11/25/2016 11:37   Ct Angio Abd/pel W And/or Wo Contrast  Result Date: 11/25/2016 CLINICAL DATA:  Chest pain and shortness of breath EXAM: CT ANGIOGRAPHY CHEST, ABDOMEN AND PELVIS TECHNIQUE: Multidetector CT imaging through the chest, abdomen and pelvis was performed using the standard protocol during bolus administration of intravenous contrast. Multiplanar reconstructed images and MIPs were obtained and reviewed to evaluate the vascular anatomy. CONTRAST:  100 mL Isovue 370. COMPARISON:  None. FINDINGS: CTA CHEST FINDINGS Cardiovascular: Thoracic aorta shows mild atherosclerotic changes without aneurysmal dilatation or dissection. The pulmonary artery shows no evidence of pulmonary emboli. No significant cardiac enlargement is seen. No coronary calcifications are noted. Mediastinum/Nodes: The  thoracic inlet is within normal limits. No hilar or mediastinal adenopathy is noted. The esophagus is within normal limits. Lungs/Pleura: Lungs are clear. No pleural effusion or pneumothorax. Musculoskeletal: No chest wall abnormality. No acute or significant osseous findings. Review of the MIP images confirms the above findings. CTA ABDOMEN AND PELVIS FINDINGS VASCULAR Aorta: Mild atherosclerotic changes are noted without aneurysmal dilatation. Intimal defect is noted in the left lateral wall just below the renal artery is consistent with small dissection. This is not likely related to the patient's current symptomatology of chest pain. Celiac: Patent without evidence of aneurysm, dissection, vasculitis or significant stenosis. SMA: Patent without evidence of aneurysm, dissection, vasculitis or significant stenosis. Renals: Will renal arteries are identified bilaterally. The accessory renal arteries bilaterally are diminutive and some as. IMA: Patent without evidence of aneurysm, dissection, vasculitis or significant stenosis. Iliacs: No significant atherosclerotic changes noted. Veins: Within normal limits. Review of the MIP images confirms the above findings. NON-VASCULAR Hepatobiliary: Diffuse fatty infiltration of the liver is noted. The gallbladder is within normal limits. Pancreas: Unremarkable. No pancreatic ductal dilatation or surrounding inflammatory changes. Spleen: Normal in size without focal abnormality. Adrenals/Urinary Tract: The adrenal glands are within normal limits. The kidneys demonstrate no renal calculi or obstructive changes. A few scattered cysts are noted. The bladder is well distended. Stomach/Bowel: Scattered diverticular change of the colon is noted without evidence of diverticulitis. The appendix has been surgically removed. No obstructive or inflammatory changes are noted. Lymphatic: No significant lymphadenopathy is noted. Reproductive: Prostate is unremarkable. Other: No abdominal  wall hernia or abnormality. No abdominopelvic ascites. Musculoskeletal: Degenerative changes of lumbar spine are  noted. No acute abnormality is seen. Review of the MIP images confirms the above findings. IMPRESSION: No evidence of pulmonary emboli. No evidence of thoracic aortic dissection. 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 This is of uncertain chronicity but of likely to correspond with the patient's current symptomatology of chest pain. Chronic changes as described above. Electronically Signed   By: Inez Catalina M.D.   On: 11/25/2016 11:37    Time Spent in minutes  30   Jani Gravel M.D on 11/26/2016 at 6:09 AM  Between 7am to 7pm - Pager - [phone]  After 7pm go to [email or web address] - password [email or web address]  [facility] -  Office  [phone]

## 2016-11-27 ENCOUNTER — Other Ambulatory Visit (HOSPITAL_COMMUNITY): Payer: Medicare Other

## 2016-11-27 DIAGNOSIS — I7102 Dissection of abdominal aorta: Secondary | ICD-10-CM

## 2016-11-27 DIAGNOSIS — R079 Chest pain, unspecified: Secondary | ICD-10-CM | POA: Diagnosis not present

## 2016-11-27 DIAGNOSIS — I10 Essential (primary) hypertension: Secondary | ICD-10-CM | POA: Diagnosis not present

## 2016-11-27 MED ORDER — GI COCKTAIL ~~LOC~~: 30.0000 mL | Freq: Once | ORAL | 0 refills | Status: AC

## 2016-11-27 MED ORDER — PANTOPRAZOLE SODIUM 40 MG PO TBEC: 40.0000 mg | DELAYED_RELEASE_TABLET | Freq: Every day | ORAL | 0 refills | Status: DC

## 2016-11-27 MED ORDER — ACETAMINOPHEN 325 MG PO TABS
650.0000 mg | ORAL_TABLET | ORAL | 0 refills | Status: DC | PRN
Start: 2016-11-27 — End: 2017-04-01

## 2016-11-27 MED ORDER — GI COCKTAIL ~~LOC~~: 30.0000 mL | Freq: Once | ORAL | Status: DC

## 2016-11-27 NOTE — Discharge Summary (Addendum)
Justin [name], is a 76 y.o. male  DOB August 09, 1940  MRN [id number].  Admission date:  11/25/2016  Admitting Physician  Waldemar Dickens, MD  Discharge Date:  11/27/2016   Primary MD  Gayland Curry, DO  Recommendations for primary care physician for things to follow:   Chest pain Somewhat atypical for aortic dissection, rule out acute coronary syndrome , GERDexacerbation  EKG shows sinus rhythm, RSR in V1 or V2, troponin negative 3 Nuclear stress test 6/28=> negative for ischemia Protonix 40mg  po qday Stop aspirin Please f/u with pcp  Aortic dissection CT dissection protocol shows small focal intimal defect in the infrarenal abdominal aorta consistent with focal dissection-discussed with Dr. Oneida Alar, vascular surgery-no intervention needed at this time Please f/u with Vascular surgery for ? Aortic dissection of uncertain chronicity Explained to patient importance of bp control   Spinocerebellar disease Patient is followed at [facility] Has concomitant dysarthria and abnormal gait and dysphagia   Hypertension-Continue home medications  GERD (gastroesophageal reflux disease)- D/c prevacid Start protonix 40mg  po qday    Admission Diagnosis  Precordial chest pain [R07.2]   Discharge Diagnosis  Precordial chest pain [R07.2]    Principal Problem:   Hypertension Active Problems:   GERD (gastroesophageal reflux disease)   Dyslipidemia   Chest pain   Aortic dissection [facility])      Past Medical History:  Diagnosis Date  . Abnormality of gait September 07 2007  . Allergic rhinitis, cause unspecified 2007  . Arthritis   . Ataxia   . Cervicalgia February 26, 2006  . Chest pain   . Cramp of limb 10/21/11  . Degeneration of intervertebral disc, site unspecified 1998  . 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 Diaphragmatic hernia without  mention of obstruction or gangrene 1982   Hiatal hernia  . Diplopia 2006  . Disturbance of skin sensation 2003  . Diverticulosis of colon (without mention of hemorrhage) 1997  . Dizziness and giddiness 2001  . Dysphagia, unspecified(787.20) 1999 and  . Esophagitis, unspecified 1997  . GERD (gastroesophageal reflux disease) 2003  . Hypertension 2003  . Impotence of organic origin 1999  . Lack of coordination September 07, 2007  . Lumbago 2003  . Migraine with aura, without mention of intractable migraine without mention of status migrainosus 1997  . Myalgia and myositis, unspecified 1999  . Other and unspecified hyperlipidemia 2003  . Other disorders of vitreous 2003  . Other malaise and fatigue 2003  . Other seborrheic keratosis 2013  . Other seborrheic keratosis 04/13/09  . Pain in joint, site unspecified 04/13/12  . Personal history of fall 04/15/11  . Restless legs syndrome (RLS) 1997  . 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 Unspecified hearing loss 2003  . Unspecified tinnitus 04/15/11    Past Surgical History:  Procedure Laterality Date  . APPENDECTOMY  Age 46  . boil removed     Dr. Donne Hazel  . C3-4 anterior cervical surgery  1999  Dr. Hal Neer  . CARDIAC CATHETERIZATION  2001   Dr. Glade Lloyd  . COLONOSCOPY  07/20/07   Dr. Sharlett Iles  . EYE SURGERY Bilateral 2015   cataracts  . LAMINOTOMY / EXCISION DISK POSTERIOR CERVICAL SPINE     Spinal Disk (?4-5)       HPI  from the history and physical done on the day of admission:   76 year old male with a history of gastroesophageal reflux disease, hypertension, restless leg syndrome, spinocerebellar disease, who presents to the ER at [facility] with substernal chest pain, 5 and 10 in intensity, pressure-like quality with radiation into the upper back, bilateral arms 7 days, symptoms progressively getting worse, also made worse with exertion. No prior history of trauma. This morning patient also  felt weak, nauseous, noted his blood pressure was running high ED course BP (!) 170/133   Pulse 71  On arrival blood pressure was elevated. EKG showed no changes, no evidence of acute ischemia, chest x-ray unremarkable. 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 Patient sent to [facility] for further evaluation of his chest pain.        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  Pt was not having any abdominal pain.  Dr. Allyson Sabal discussed w vascular surgery => no surgical intervention needed.  Pt had nuclear stress test on 6/28=>negative.  Pt had atypical burning description with radiation of burning to left arm?.  His symptoms resolved with treatment with protonix.  Pt is not currently having any chest pain or abdominal pain and feels stable and will be discharge to home.    Follow UP  Follow-up Information    Gayland Curry, DO On 12/03/2016.   Specialty:  Geriatric Medicine Why:  AT 10:45am for hospital follow up w/ Stephanie Acre  Contact information: [address] [place] [address] [phone]        Elam Dutch, MD.   Specialties:  Vascular Surgery, Cardiology Why:  Please call the schedule hospital follow up for with 7-10 days  Contact information: [place]  [address] [phone]            Consults obtained - vascular  Discharge Condition: stable  Diet and Activity recommendation: See Discharge Instructions below  Discharge Instructions       Discharge Medications     Allergies as of 11/27/2016      Reactions   Aciphex [rabeprazole Sodium]    Aleve [naproxen Sodium]    Effexor [venlafaxine Hcl]    Serzone [nefazodone]    Vivactil [protriptyline Hcl]       Medication List    STOP taking these medications   aspirin 325 MG EC tablet   celecoxib 200 MG capsule Commonly known as:  CELEBREX   lansoprazole 30 MG  capsule Commonly known as:  PREVACID Replaced by:  pantoprazole 40 MG tablet   naproxen sodium 220 MG tablet Commonly known as:  ALEVE     TAKE these medications   acetaminophen 325 MG tablet Commonly known as:  TYLENOL Take 2 tablets (650 mg total) by mouth every 4 (four) hours as needed for headache or mild pain.   gi cocktail Susp suspension Take 30 mLs by mouth once. Shake well.   HYDROcodone-acetaminophen 10-325 MG tablet Commonly known as:  NORCO One up to 3 times daily for pain relief   losartan 100 MG tablet Commonly  known as:  COZAAR TAKE 1 TABLET (100 MG TOTAL) BY MOUTH DAILY.   metoprolol succinate 100 MG 24 hr tablet Commonly known as:  TOPROL-XL TAKE 1 TABLET BY MOUTH EVERY DAY   mometasone 50 MCG/ACT nasal spray Commonly known as:  NASONEX Place 2 sprays into the nose daily. 1 Puff in each nostril for allergies   nystatin cream Commonly known as:  MYCOSTATIN Apply twice daily to the rash   oxybutynin 5 MG tablet Commonly known as:  DITROPAN One up to 3 times daily to help bladder control   pantoprazole 40 MG tablet Commonly known as:  PROTONIX Take 1 tablet (40 mg total) by mouth daily. Replaces:  lansoprazole 30 MG capsule   sildenafil 20 MG tablet Commonly known as:  REVATIO Take 2-5 tablets prior to intercourse       Major procedures and Radiology Reports - PLEASE review detailed and final reports for all details, in brief -      Dg Chest 2 View  Result Date: 11/25/2016 CLINICAL DATA:  Chest pain radiating to the back and both arms associated with shortness of breath and nausea. EXAM: CHEST  2 VIEW COMPARISON:  Chest x-ray of March 03, 2011 FINDINGS: The lungs are adequately inflated and clear. The heart and pulmonary vascularity are normal. The mediastinum is normal in width. There is no pleural effusion. The bony thorax exhibits no acute abnormality. IMPRESSION: There is no acute cardiopulmonary abnormality. Electronically Signed   By:  David  Martinique M.D.   On: 11/25/2016 09:22   Nm Myocar Multi W/spect W/wall Motion / Ef  Result Date: 11/26/2016  There was no ST segment deviation noted during stress.  No T wave inversion was noted during stress.  The study is normal.  This is a low risk study.  The left ventricular ejection fraction is hyperdynamic (>65%).  Normal Lexiscan nuclear stress test with no evidence of prior infarct or ischemia.   Ct Angio Chest Aorta W And/or Wo Contrast  Result Date: 11/25/2016 CLINICAL DATA:  Chest pain and shortness of breath EXAM: CT ANGIOGRAPHY CHEST, ABDOMEN AND PELVIS TECHNIQUE: Multidetector CT imaging through the chest, abdomen and pelvis was performed using the standard protocol during bolus administration of intravenous contrast. Multiplanar reconstructed images and MIPs were obtained and reviewed to evaluate the vascular anatomy. CONTRAST:  100 mL Isovue 370. COMPARISON:  None. FINDINGS: CTA CHEST FINDINGS Cardiovascular: Thoracic aorta shows mild atherosclerotic changes without aneurysmal dilatation or dissection. The pulmonary artery shows no evidence of pulmonary emboli. No significant cardiac enlargement is seen. No coronary calcifications are noted. Mediastinum/Nodes: The thoracic inlet is within normal limits. No hilar or mediastinal adenopathy is noted. The esophagus is within normal limits. Lungs/Pleura: Lungs are clear. No pleural effusion or pneumothorax. Musculoskeletal: No chest wall abnormality. No acute or significant osseous findings. Review of the MIP images confirms the above findings. CTA ABDOMEN AND PELVIS FINDINGS VASCULAR Aorta: Mild atherosclerotic changes are noted without aneurysmal dilatation. Intimal defect is noted in the left lateral wall just below the renal artery is consistent with small dissection. This is not likely related to the patient's current symptomatology of chest pain. Celiac: Patent without evidence of aneurysm, dissection, vasculitis or significant  stenosis. SMA: Patent without evidence of aneurysm, dissection, vasculitis or significant stenosis. Renals: Will renal arteries are identified bilaterally. The accessory renal arteries bilaterally are diminutive and some as. IMA: Patent without evidence of aneurysm, dissection, vasculitis or significant stenosis. Iliacs: No significant atherosclerotic changes noted. Veins: Within normal limits.  Review of the MIP images confirms the above findings. NON-VASCULAR Hepatobiliary: Diffuse fatty infiltration of the liver is noted. The gallbladder is within normal limits. Pancreas: Unremarkable. No pancreatic ductal dilatation or surrounding inflammatory changes. Spleen: Normal in size without focal abnormality. Adrenals/Urinary Tract: The adrenal glands are within normal limits. The kidneys demonstrate no renal calculi or obstructive changes. A few scattered cysts are noted. The bladder is well distended. Stomach/Bowel: Scattered diverticular change of the colon is noted without evidence of diverticulitis. The appendix has been surgically removed. No obstructive or inflammatory changes are noted. Lymphatic: No significant lymphadenopathy is noted. Reproductive: Prostate is unremarkable. Other: No abdominal wall hernia or abnormality. No abdominopelvic ascites. Musculoskeletal: Degenerative changes of lumbar spine are noted. No acute abnormality is seen. Review of the MIP images confirms the above findings. IMPRESSION: No evidence of pulmonary emboli. No evidence of thoracic aortic dissection. 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 This is of uncertain chronicity but of likely to correspond with the patient's current symptomatology of chest pain. Chronic changes as described above. Electronically Signed   By: Inez Catalina M.D.   On: 11/25/2016 11:37   Ct Angio Abd/pel W And/or Wo Contrast  Result Date: 11/25/2016 CLINICAL DATA:  Chest pain and shortness of breath  EXAM: CT ANGIOGRAPHY CHEST, ABDOMEN AND PELVIS TECHNIQUE: Multidetector CT imaging through the chest, abdomen and pelvis was performed using the standard protocol during bolus administration of intravenous contrast. Multiplanar reconstructed images and MIPs were obtained and reviewed to evaluate the vascular anatomy. CONTRAST:  100 mL Isovue 370. COMPARISON:  None. FINDINGS: CTA CHEST FINDINGS Cardiovascular: Thoracic aorta shows mild atherosclerotic changes without aneurysmal dilatation or dissection. The pulmonary artery shows no evidence of pulmonary emboli. No significant cardiac enlargement is seen. No coronary calcifications are noted. Mediastinum/Nodes: The thoracic inlet is within normal limits. No hilar or mediastinal adenopathy is noted. The esophagus is within normal limits. Lungs/Pleura: Lungs are clear. No pleural effusion or pneumothorax. Musculoskeletal: No chest wall abnormality. No acute or significant osseous findings. Review of the MIP images confirms the above findings. CTA ABDOMEN AND PELVIS FINDINGS VASCULAR Aorta: Mild atherosclerotic changes are noted without aneurysmal dilatation. Intimal defect is noted in the left lateral wall just below the renal artery is consistent with small dissection. This is not likely related to the patient's current symptomatology of chest pain. Celiac: Patent without evidence of aneurysm, dissection, vasculitis or significant stenosis. SMA: Patent without evidence of aneurysm, dissection, vasculitis or significant stenosis. Renals: Will renal arteries are identified bilaterally. The accessory renal arteries bilaterally are diminutive and some as. IMA: Patent without evidence of aneurysm, dissection, vasculitis or significant stenosis. Iliacs: No significant atherosclerotic changes noted. Veins: Within normal limits. Review of the MIP images confirms the above findings. NON-VASCULAR Hepatobiliary: Diffuse fatty infiltration of the liver is noted. The gallbladder  is within normal limits. Pancreas: Unremarkable. No pancreatic ductal dilatation or surrounding inflammatory changes. Spleen: Normal in size without focal abnormality. Adrenals/Urinary Tract: The adrenal glands are within normal limits. The kidneys demonstrate no renal calculi or obstructive changes. A few scattered cysts are noted. The bladder is well distended. Stomach/Bowel: Scattered diverticular change of the colon is noted without evidence of diverticulitis. The appendix has been surgically removed. No obstructive or inflammatory changes are noted. Lymphatic: No significant lymphadenopathy is noted. Reproductive: Prostate is unremarkable. Other: No abdominal wall hernia or abnormality. No abdominopelvic ascites. Musculoskeletal: Degenerative changes of lumbar spine are noted. No acute abnormality is seen.  Review of the MIP images confirms the above findings. IMPRESSION: No evidence of pulmonary emboli. No evidence of thoracic aortic dissection. 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 This is of uncertain chronicity but of likely to correspond with the patient's current symptomatology of chest pain. Chronic changes as described above. Electronically Signed   By: Inez Catalina M.D.   On: 11/25/2016 11:37    Micro Results     No results found for this or any previous visit (from the past 240 hour(s)).     Today   Subjective    Justin [name] today has no headache,no chest, no abdominal pain,no new weakness tingling or numbness, feels much better wants to go home today.    Objective   Blood pressure 124/66, pulse 69, temperature 98.2 F (36.8 C), temperature source Oral, resp. rate 20, height 5\' 7"  (1.702 m), weight 68.7 kg (151 lb 8 oz), SpO2 93 %.   Intake/Output Summary (Last 24 hours) at 11/27/16 1241 Last data filed at 11/27/16 0840  Gross per 24 hour  Intake              240 ml  Output              680 ml  Net             -440  ml    Exam Awake Alert, Oriented x 3, No new F.N deficits, Normal affect [place].AT,PERRAL Supple Neck,No JVD, No cervical lymphadenopathy appriciated.  Symmetrical Chest wall movement, Good air movement bilaterally, CTAB RRR,No Gallops,Rubs or new Murmurs, No Parasternal Heave +ve B.Sounds, Abd Soft, Non tender, No organomegaly appriciated, No rebound -guarding or rigidity. No Cyanosis, Clubbing or edema, No new Rash or bruise   Data Review   CBC w Diff:  Lab Results  Component Value Date   WBC 7.6 11/26/2016   HGB 14.7 11/26/2016   HCT 43.4 11/26/2016   PLT 161 11/26/2016    CMP:  Lab Results  Component Value Date   NA 139 11/26/2016   NA 141 08/19/2015   K 3.8 11/26/2016   CL 107 11/26/2016   CO2 23 11/26/2016   BUN 15 11/26/2016   BUN 14 08/19/2015   CREATININE 0.91 11/26/2016   CREATININE 0.90 02/28/2016   PROT 5.4 (L) 11/26/2016   PROT 6.1 08/19/2015   ALBUMIN 3.3 (L) 11/26/2016   ALBUMIN 4.0 08/19/2015   BILITOT 1.2 11/26/2016   BILITOT 0.4 08/19/2015   ALKPHOS 120 11/26/2016   AST 16 11/26/2016   ALT 15 (L) 11/26/2016  .   Total Time in preparing paper work, data evaluation and todays exam - 64 minutes  Jani Gravel M.D on 11/27/2016 at 12:41 PM  [facility]   Office  [phone]

## 2016-11-27 NOTE — Progress Notes (Signed)
Pt stable, vitals stable, no nay complain of pain and SOB, one time GI cocktail provided, will continue to monitor the patient

## 2016-11-30 ENCOUNTER — Telehealth: Payer: Self-pay

## 2016-11-30 NOTE — Telephone Encounter (Signed)
I have made the 1st attempt to contact the patient or family member in charge, in order to follow up from recently being discharged from the hospital. I left a message on voicemail but I will make another attempt at a different time.  

## 2016-12-03 ENCOUNTER — Encounter: Payer: Self-pay | Admitting: Nurse Practitioner

## 2016-12-03 ENCOUNTER — Ambulatory Visit (INDEPENDENT_AMBULATORY_CARE_PROVIDER_SITE_OTHER): Payer: Medicare Other | Admitting: Nurse Practitioner

## 2016-12-03 VITALS — BP 142/86 | HR 65 | Temp 97.9°F | Resp 18 | Ht 67.0 in | Wt 157.4 lb

## 2016-12-03 DIAGNOSIS — K219 Gastro-esophageal reflux disease without esophagitis: Secondary | ICD-10-CM

## 2016-12-03 DIAGNOSIS — G118 Other hereditary ataxias: Secondary | ICD-10-CM | POA: Diagnosis not present

## 2016-12-03 DIAGNOSIS — I10 Essential (primary) hypertension: Secondary | ICD-10-CM | POA: Diagnosis not present

## 2016-12-03 DIAGNOSIS — I7102 Dissection of abdominal aorta: Secondary | ICD-10-CM | POA: Diagnosis not present

## 2016-12-03 DIAGNOSIS — G119 Hereditary ataxia, unspecified: Secondary | ICD-10-CM

## 2016-12-03 MED ORDER — PANTOPRAZOLE SODIUM 40 MG PO TBEC: 40.0000 mg | DELAYED_RELEASE_TABLET | Freq: Every day | ORAL | 3 refills | Status: DC

## 2016-12-03 NOTE — Telephone Encounter (Signed)
I have made the 2nd attempt to contact the patient or family member in charge, in order to follow up from recently being discharged from the hospital. I left a message on voicemail. Patient is to see provider today at 10:45. appt was confirmed on 12/01/2016

## 2016-12-03 NOTE — Progress Notes (Signed)
Careteam: Patient Care Team: Gayland Curry, DO as PCP - General (Geriatric Medicine)  Advanced Directive information Type of Advance Directive: Healthcare Power of [place];Living will  Allergies  Allergen Reactions  . Aciphex [Rabeprazole Sodium]   . Aleve [Naproxen Sodium]   . Effexor [Venlafaxine Hcl]   . Serzone [Nefazodone]   . Vivactil [Protriptyline Hcl]     Chief Complaint  Patient presents with  . Hospitalization Follow-up    Pt was seen discharged from [facility] for stay 6/27 to 6/29 due to hypertension. Pt reports he has been feeling well since discharge.      HPI: Patient is a 76 y.o. male seen in the office today to follow up hospitalization. Pt with hx of gastroesophageal reflux disease, hypertension, restless leg syndrome, spinocerebellar disease, who went to the ER at [facility] [place] with substernal chest pain, 5 and 10 in intensity, pressure-like quality with radiation into the upper back, bilateral arms7 days, symptoms progressively getting worse, also made worse with exertion. No prior history of trauma. 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  Pt was not having any abdominal pain.  per hospital record Dr. Allyson Sabal discussed w vascular surgery => no surgical intervention needed.  Pt had nuclear stress test on 6/28=>negative.  Pt had atypical burning description with radiation of burning to left arm?.  His symptoms resolved with treatment with protonix. He has been doing well with protonix Stopped ASA and aleve Decreased celebrex to every other day -- does not want to stop this completely due to his joint pain.  Also has altered balance due to spinocerebellar disease.   Review of Systems:  Review of Systems  Constitutional: Negative for chills, fever and weight loss.  HENT: Negative for tinnitus.   Respiratory: Negative for cough, sputum  production and shortness of breath.   Cardiovascular: Negative for chest pain, palpitations and leg swelling.  Gastrointestinal: Positive for heartburn (improved). Negative for abdominal pain, constipation and diarrhea.  Genitourinary: Negative for dysuria, frequency and urgency.  Musculoskeletal: Negative for back pain, falls, joint pain and myalgias.  Skin: Negative.   Neurological: Negative for dizziness and headaches.  Psychiatric/Behavioral: Negative for depression and memory loss. The patient does not have insomnia.     Past Medical History:  Diagnosis Date  . Abnormality of gait September 07 2007  . Allergic rhinitis, cause unspecified 2007  . Arthritis   . Ataxia   . Cervicalgia February 26, 2006  . Chest pain   . Cramp of limb 10/21/11  . Degeneration of intervertebral disc, site unspecified 1998  . 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 Diaphragmatic hernia without mention of obstruction or gangrene 1982   Hiatal hernia  . Diplopia 2006  . Disturbance of skin sensation 2003  . Diverticulosis of colon (without mention of hemorrhage) 1997  . Dizziness and giddiness 2001  . Dysphagia, unspecified(787.20) 1999 and  . Esophagitis, unspecified 1997  . GERD (gastroesophageal reflux disease) 2003  . Hypertension 2003  . Impotence of organic origin 1999  . Lack of coordination September 07, 2007  . Lumbago 2003  . Migraine with aura, without mention of intractable migraine without mention of status migrainosus 1997  . Myalgia and myositis, unspecified 1999  . Other and unspecified hyperlipidemia 2003  . Other disorders of vitreous 2003  . Other malaise and fatigue 2003  . Other seborrheic keratosis 2013  .  Other seborrheic keratosis 04/13/09  . Pain in joint, site unspecified 04/13/12  . Personal history of fall 04/15/11  . Restless legs syndrome (RLS) 1997  . 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 Unspecified hearing loss 2003    . Unspecified tinnitus 04/15/11   Past Surgical History:  Procedure Laterality Date  . APPENDECTOMY  Age 4  . boil removed     Dr. Donne Hazel  . C3-4 anterior cervical surgery  1999   Dr. Hal Neer  . CARDIAC CATHETERIZATION  2001   Dr. Glade Lloyd  . COLONOSCOPY  07/20/07   Dr. Sharlett Iles  . EYE SURGERY Bilateral 2015   cataracts  . LAMINOTOMY / EXCISION DISK POSTERIOR CERVICAL SPINE     Spinal Disk (?4-5)   Social History:   reports that he has never smoked. He has quit using smokeless tobacco. His smokeless tobacco use included Chew. He reports that he drinks alcohol. He reports that he does not use drugs.  Family History  Problem Relation Age of Onset  . Heart disease Father   . Diabetes Neg Hx   . Colon cancer Neg Hx   . Colon polyps Neg Hx   . Esophageal cancer Neg Hx   . Kidney disease Neg Hx   . Gallbladder disease Neg Hx     Medications: Patient's Medications  New Prescriptions   No medications on file  Previous Medications   ACETAMINOPHEN (TYLENOL) 325 MG TABLET    Take 2 tablets (650 mg total) by mouth every 4 (four) hours as needed for headache or mild pain.   CELECOXIB (CELEBREX) 200 MG CAPSULE    Take 1 capsule by mouth every other day.   HYDROCODONE-ACETAMINOPHEN (NORCO) 10-325 MG TABLET    One up to 3 times daily for pain relief   LOSARTAN (COZAAR) 100 MG TABLET    TAKE 1 TABLET (100 MG TOTAL) BY MOUTH DAILY.   METOPROLOL SUCCINATE (TOPROL-XL) 100 MG 24 HR TABLET    TAKE 1 TABLET BY MOUTH EVERY DAY   MOMETASONE (NASONEX) 50 MCG/ACT NASAL SPRAY    Place 2 sprays into the nose daily. 1 Puff in each nostril for allergies   NYSTATIN CREAM (MYCOSTATIN)    Apply twice daily to the rash   OXYBUTYNIN (DITROPAN) 5 MG TABLET    One up to 3 times daily to help bladder control   PANTOPRAZOLE (PROTONIX) 40 MG TABLET    Take 1 tablet (40 mg total) by mouth daily.   SILDENAFIL (REVATIO) 20 MG TABLET    Take 2-5 tablets prior to intercourse  Modified Medications   No  medications on file  Discontinued Medications   No medications on file     Physical Exam:  Vitals:   12/03/16 1100  BP: (!) 142/86  Pulse: 65  Resp: 18  Temp: 97.9 F (36.6 C)  TempSrc: Oral  SpO2: 97%  Weight: 157 lb 6.4 oz (71.4 kg)  Height: 5\' 7"  (1.702 m)   Body mass index is 24.65 kg/m.  Physical Exam  Constitutional: He is oriented to person, place, and time. He appears well-developed and well-nourished. No distress.  HENT:  Head: Normocephalic.  Right Ear: External ear normal.  Left Ear: External ear normal.  Nose: Nose normal.  Mouth/Throat: Oropharynx is clear and moist.  Eyes: Conjunctivae and EOM are normal.  Neck: Neck supple. No JVD present. No tracheal deviation present. No thyromegaly present.  Dysphonia and "scanning" speech.  Cardiovascular: Normal rate, regular rhythm, normal heart sounds and intact distal  pulses.  Exam reveals no gallop and no friction rub.   No murmur heard. Pulmonary/Chest: No respiratory distress. He has no wheezes. He has no rales. He exhibits no tenderness.  Abdominal: He exhibits no distension and no mass. There is no tenderness.  Musculoskeletal: He exhibits no edema or tenderness.  Gait disturbance. Using walker.  Lymphadenopathy:    He has no cervical adenopathy.  Neurological: He is alert and oriented to person, place, and time. He has normal reflexes. No cranial nerve deficit. Coordination abnormal.  Ataxic.Dysphagia. Dysphonia. Poor balance.  Skin: No rash noted. No erythema. No pallor.  Psychiatric: He has a normal mood and affect. His behavior is normal. Judgment and thought content normal.    Labs reviewed: Basic Metabolic Panel:  Recent Labs  02/28/16 0828 11/25/16 0840 11/26/16 0531  NA 139 138 139  K 4.5 3.9 3.8  CL 105 105 107  CO2 27 25 23   GLUCOSE 94 141* 93  BUN 16 15 15   CREATININE 0.90 1.08 0.91  CALCIUM 9.0 9.2 9.1   Liver Function Tests:  Recent Labs  02/28/16 0828 11/25/16 1854  11/26/16 0531  AST 15 19 16   ALT 14 17 15*  ALKPHOS 106 133* 120  BILITOT 0.8 0.7 1.2  PROT 6.4 6.2* 5.4*  ALBUMIN 4.1 3.8 3.3*    Recent Labs  11/25/16 1854  LIPASE 32   No results for input(s): AMMONIA in the last 8760 hours. CBC:  Recent Labs  11/25/16 0840 11/26/16 0531  WBC 6.1 7.6  HGB 15.8 14.7  HCT 45.1 43.4  MCV 87.7 88.8  PLT 183 161   Lipid Panel:  Recent Labs  02/28/16 0828 11/26/16 0531  CHOL 211* 149  HDL 46 38*  LDLCALC 119 81  TRIG 231* 148  CHOLHDL 4.6 3.9   TSH: No results for input(s): TSH in the last 8760 hours. A1C: No results found for: HGBA1C   Assessment/Plan 1. Dissection of abdominal aorta (HCC) -incidental finding during hospitalization - Ambulatory referral to Vascular Surgery for ongoing monitoring.   2. Essential hypertension Recently cut back on sodium intake; not at goal in office today but discussed dietary changes. To take blood pressure at home. If stays over 140/90 to notify office   3. Gastroesophageal reflux disease without esophagitis -received GI cocktail with good relief. Reports he got Rx from hospital physician but has not needed this after changing to protonix.  - pantoprazole (PROTONIX) 40 MG tablet; Take 1 tablet (40 mg total) by mouth daily.  Dispense: 30 tablet; Refill: 3  4. 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[place]) -with gait disturbances due to balance also with arthritis therefore he does not wish to stop celebrex completely due to further complications with gait from pain. GERD has improved on current regimen so okay to cont every other day at this time.   To keep follow up with Dr Mariea Clonts, sooner if needed Cayey K. Harle Battiest  [facility] [phone] 8 am - 5 pm) [phone] (after hours)

## 2016-12-16 ENCOUNTER — Encounter: Payer: Self-pay | Admitting: Nurse Practitioner

## 2016-12-16 ENCOUNTER — Telehealth: Payer: Self-pay | Admitting: *Deleted

## 2016-12-16 ENCOUNTER — Other Ambulatory Visit: Payer: Self-pay | Admitting: Nurse Practitioner

## 2016-12-16 MED ORDER — AMLODIPINE BESYLATE 5 MG PO TABS: 5.0000 mg | ORAL_TABLET | Freq: Every day | ORAL | 0 refills | Status: DC

## 2016-12-16 NOTE — Telephone Encounter (Signed)
Patient took an extra Metoprolol. Patient will call back and schedule follow up appointment. Aware that Janett Billow faxed Rx to pharmacy, will pick up.

## 2016-12-16 NOTE — Telephone Encounter (Signed)
What extra medication did he take? Appears that the doses of his medications are at the [facility] currently.  Would recommend starting norvasc 5 mg daily and make appt in 2 weeks to follow up blood pressure in office

## 2016-12-16 NOTE — Telephone Encounter (Signed)
Patient caregiver, Kenney Houseman called and stated that patient stated that he was to call back and give you his BP readings.   12/11/16-  142/87 12/14/16-  124/87 12/15/16-  142/108 12/16/16-  180/123am then at 11:00 135/106  Taking BP medication as directed. Did take an extra BP pill this morning due to it being high. Please Advise.

## 2016-12-21 ENCOUNTER — Encounter: Payer: Self-pay | Admitting: Nurse Practitioner

## 2016-12-21 ENCOUNTER — Encounter: Payer: Self-pay | Admitting: Internal Medicine

## 2016-12-21 ENCOUNTER — Ambulatory Visit (INDEPENDENT_AMBULATORY_CARE_PROVIDER_SITE_OTHER): Payer: Medicare Other | Admitting: Internal Medicine

## 2016-12-21 VITALS — BP 150/90 | HR 100 | Temp 97.9°F

## 2016-12-21 DIAGNOSIS — R079 Chest pain, unspecified: Secondary | ICD-10-CM | POA: Diagnosis not present

## 2016-12-21 DIAGNOSIS — I10 Essential (primary) hypertension: Secondary | ICD-10-CM

## 2016-12-21 DIAGNOSIS — I7102 Dissection of abdominal aorta: Secondary | ICD-10-CM | POA: Insufficient documentation

## 2016-12-21 DIAGNOSIS — R109 Unspecified abdominal pain: Secondary | ICD-10-CM | POA: Diagnosis not present

## 2016-12-21 MED ORDER — METOPROLOL SUCCINATE ER 25 MG PO TB24: 25.0000 mg | ORAL_TABLET | Freq: Every day | ORAL | 3 refills | Status: DC

## 2016-12-21 NOTE — Progress Notes (Signed)
Location:  [facility] clinic Provider: Nicolaas Savo L. Mariea Clonts, D.O., C.M.D.  Code Status: full code (based on prior, but need to review with him at a regular appt)  Goals of Care:  Advanced Directives 12/03/2016  Does Patient Have a Medical Advance Directive? -  Type of Paramedic of [place];Living will  Does patient want to make changes to medical advance directive? -  Copy of [place] in Chart? No - copy requested  Would patient like information on creating a medical advance directive? -   Chief Complaint  Patient presents with  . Acute Visit    increase in blood pressure and heart rate    HPI: Patient is a 76 y.o. 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  Pt has had elevated BP and sent his readings yesterday which were quite high with his wrist cuff.  He did not feel well this am.  This am, his HR was elevated and was beating in his left ear.  He's been taking his amlodipine 5mg  daily plus the losartan.  Yesterday, he felt really bad.  He felt some pain in his upper left arm.  He knows if he eats something with bad indigestion, he had the same kind of discomfort and had a small tear in his aorta which was not felt to cause the pain, but felt it was epigastric and responded to a GI regimen.  Vascular said the tear was not enough to operate.  He has an upcoming appt.  Has had a few sharp pain in her lower abdomen also--one this am and one yesterday.  Reports the lower abdominal pain is like a cramp.  He says he was on something for cramps a while back.  Says he has some at home.  Bowels are moving normally.  BP here was 150/90 and his cuff read 159/105.  Message from earlier said:   "184/117  179/119  175/123  151/113  155/111  this morning 196/123 pulse 96  don't feel good, been this way last 5 days"  He reports he has the ache in his posterior  left arm sitting in the chair this am at his appt.  None in his chest.  No headache.  No new neuro deficits.    Past Medical History:  Diagnosis Date  . Abnormality of gait September 07 2007  . Allergic rhinitis, cause unspecified 2007  . Arthritis   . Ataxia   . Cervicalgia February 26, 2006  . Chest pain   . Cramp of limb 10/21/11  . Degeneration of intervertebral disc, site unspecified 1998  . 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 Diaphragmatic hernia without mention of obstruction or gangrene 1982   Hiatal hernia  . Diplopia 2006  . Disturbance of skin sensation 2003  . Diverticulosis of colon (without mention of hemorrhage) 1997  . Dizziness and giddiness 2001  . Dysphagia, unspecified(787.20) 1999 and  . Esophagitis, unspecified 1997  . GERD (gastroesophageal reflux disease) 2003  . Hypertension 2003  . Impotence of organic origin 1999  . Lack of coordination September 07, 2007  . Lumbago 2003  . Migraine with aura, without mention of intractable migraine without mention of status migrainosus 1997  . Myalgia and myositis, unspecified 1999  . Other and unspecified hyperlipidemia 2003  . Other disorders of vitreous 2003  . Other malaise and fatigue 2003  .  Other seborrheic keratosis 2013  . Other seborrheic keratosis 04/13/09  . Pain in joint, site unspecified 04/13/12  . Personal history of fall 04/15/11  . Restless legs syndrome (RLS) 1997  . 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 Unspecified hearing loss 2003  . Unspecified tinnitus 04/15/11    Past Surgical History:  Procedure Laterality Date  . APPENDECTOMY  Age 89  . boil removed     Dr. Donne Hazel  . C3-4 anterior cervical surgery  1999   Dr. Hal Neer  . CARDIAC CATHETERIZATION  2001   Dr. Glade Lloyd  . COLONOSCOPY  07/20/07   Dr. Sharlett Iles  . EYE SURGERY Bilateral 2015   cataracts  . LAMINOTOMY / EXCISION DISK POSTERIOR CERVICAL SPINE     Spinal Disk (?4-5)     Allergies  Allergen Reactions  . Aciphex [Rabeprazole Sodium]   . Aleve [Naproxen Sodium]   . Effexor [Venlafaxine Hcl]   . Serzone [Nefazodone]   . Vivactil [Protriptyline Hcl]     Allergies as of 12/21/2016      Reactions   Aciphex [rabeprazole Sodium]    Aleve [naproxen Sodium]    Effexor [venlafaxine Hcl]    Serzone [nefazodone]    Vivactil [protriptyline Hcl]       Medication List       Accurate as of 12/21/16 11:45 AM. Always use your most recent med list.          acetaminophen 325 MG tablet Commonly known as:  TYLENOL Take 2 tablets (650 mg total) by mouth every 4 (four) hours as needed for headache or mild pain.   amLODipine 5 MG tablet Commonly known as:  NORVASC Take 1 tablet (5 mg total) by mouth daily.   celecoxib 200 MG capsule Commonly known as:  CELEBREX Take 1 capsule by mouth every other day.   losartan 100 MG tablet Commonly known as:  COZAAR TAKE 1 TABLET (100 MG TOTAL) BY MOUTH DAILY.   metoprolol succinate 100 MG 24 hr tablet Commonly known as:  TOPROL-XL TAKE 1 TABLET BY MOUTH EVERY DAY   mometasone 50 MCG/ACT nasal spray Commonly known as:  NASONEX Place 2 sprays into the nose daily. 1 Puff in each nostril for allergies   nystatin cream Commonly known as:  MYCOSTATIN Apply twice daily to the rash   oxybutynin 5 MG tablet Commonly known as:  DITROPAN One up to 3 times daily to help bladder control   pantoprazole 40 MG tablet Commonly known as:  PROTONIX Take 1 tablet (40 mg total) by mouth daily.   sildenafil 20 MG tablet Commonly known as:  REVATIO Take 2-5 tablets prior to intercourse       Review of Systems:  Review of Systems  Constitutional: Negative for chills, fever and malaise/fatigue.  HENT: Positive for hearing loss. Negative for congestion.   Eyes: Negative for blurred vision.       Glasses  Respiratory: Negative for cough and shortness of breath.   Cardiovascular: Positive for chest pain and  palpitations. Negative for orthopnea, claudication, leg swelling and PND.  Gastrointestinal: Positive for heartburn. Negative for abdominal pain, blood in stool, constipation, diarrhea, melena, nausea and vomiting.  Genitourinary: Negative for dysuria.  Musculoskeletal: Negative for falls and myalgias.       Unsteady gait chronic  Skin: Negative for itching and rash.  Neurological: Positive for weakness. Negative for dizziness.  Endo/Heme/Allergies: Does not bruise/bleed easily.  Psychiatric/Behavioral: Negative for depression and memory loss.    Health Maintenance  Topic  Date Due  . PNA vac Low Risk Adult (2 of 2 - PPSV23) 10/12/2014  . INFLUENZA VACCINE  12/30/2016  . COLONOSCOPY  07/19/2017  . TETANUS/TDAP  06/11/2021    Physical Exam: Vitals:   12/21/16 1131  BP: (!) 150/90  Pulse: 100  Temp: 97.9 F (36.6 C)  TempSrc: Oral  SpO2: 97%   There is no height or weight on file to calculate BMI. Physical Exam  Constitutional: He is oriented to person, place, and time. He appears well-developed and well-nourished.  HENT:  Head: Normocephalic and atraumatic.  Cardiovascular: Normal rate, regular rhythm, normal heart sounds and intact distal pulses.   Pulmonary/Chest: Effort normal and breath sounds normal. No respiratory distress.  Abdominal: Soft. Bowel sounds are normal.  Musculoskeletal:  Ambulates with three-wheeled walker   Neurological: He is alert and oriented to person, place, and time.  Dysarthria, rhythmic speech, takes short deep breaths between words  Skin: Skin is warm and dry. Capillary refill takes less than 2 seconds.  Psychiatric: He has a normal mood and affect.    Labs reviewed: Basic Metabolic Panel:  Recent Labs  02/28/16 0828 11/25/16 0840 11/26/16 0531  NA 139 138 139  K 4.5 3.9 3.8  CL 105 105 107  CO2 27 25 23   GLUCOSE 94 141* 93  BUN 16 15 15   CREATININE 0.90 1.08 0.91  CALCIUM 9.0 9.2 9.1   Liver Function Tests:  Recent Labs   02/28/16 0828 11/25/16 1854 11/26/16 0531  AST 15 19 16   ALT 14 17 15*  ALKPHOS 106 133* 120  BILITOT 0.8 0.7 1.2  PROT 6.4 6.2* 5.4*  ALBUMIN 4.1 3.8 3.3*    Recent Labs  11/25/16 1854  LIPASE 32   No results for input(s): AMMONIA in the last 8760 hours. CBC:  Recent Labs  11/25/16 0840 11/26/16 0531  WBC 6.1 7.6  HGB 15.8 14.7  HCT 45.1 43.4  MCV 87.7 88.8  PLT 183 161   Lipid Panel:  Recent Labs  02/28/16 0828 11/26/16 0531  CHOL 211* 149  HDL 46 38*  LDLCALC 119 81  TRIG 231* 148  CHOLHDL 4.6 3.9   No results found for: HGBA1C  Procedures since last visit: Dg Chest 2 View  Result Date: 11/25/2016 CLINICAL DATA:  Chest pain radiating to the back and both arms associated with shortness of breath and nausea. EXAM: CHEST  2 VIEW COMPARISON:  Chest x-ray of March 03, 2011 FINDINGS: The lungs are adequately inflated and clear. The heart and pulmonary vascularity are normal. The mediastinum is normal in width. There is no pleural effusion. The bony thorax exhibits no acute abnormality. IMPRESSION: There is no acute cardiopulmonary abnormality. Electronically Signed   By: David  Martinique M.D.   On: 11/25/2016 09:22   Nm Myocar Multi W/spect W/wall Motion / Ef  Result Date: 11/26/2016  There was no ST segment deviation noted during stress.  No T wave inversion was noted during stress.  The study is normal.  This is a low risk study.  The left ventricular ejection fraction is hyperdynamic (>65%).  Normal Lexiscan nuclear stress test with no evidence of prior infarct or ischemia.   Ct Angio Chest Aorta W And/or Wo Contrast  Result Date: 11/25/2016 CLINICAL DATA:  Chest pain and shortness of breath EXAM: CT ANGIOGRAPHY CHEST, ABDOMEN AND PELVIS TECHNIQUE: Multidetector CT imaging through the chest, abdomen and pelvis was performed using the standard protocol during bolus administration of intravenous contrast. Multiplanar reconstructed images and  MIPs were obtained  and reviewed to evaluate the vascular anatomy. CONTRAST:  100 mL Isovue 370. COMPARISON:  None. FINDINGS: CTA CHEST FINDINGS Cardiovascular: Thoracic aorta shows mild atherosclerotic changes without aneurysmal dilatation or dissection. The pulmonary artery shows no evidence of pulmonary emboli. No significant cardiac enlargement is seen. No coronary calcifications are noted. Mediastinum/Nodes: The thoracic inlet is within normal limits. No hilar or mediastinal adenopathy is noted. The esophagus is within normal limits. Lungs/Pleura: Lungs are clear. No pleural effusion or pneumothorax. Musculoskeletal: No chest wall abnormality. No acute or significant osseous findings. Review of the MIP images confirms the above findings. CTA ABDOMEN AND PELVIS FINDINGS VASCULAR Aorta: Mild atherosclerotic changes are noted without aneurysmal dilatation. Intimal defect is noted in the left lateral wall just below the renal artery is consistent with small dissection. This is not likely related to the patient's current symptomatology of chest pain. Celiac: Patent without evidence of aneurysm, dissection, vasculitis or significant stenosis. SMA: Patent without evidence of aneurysm, dissection, vasculitis or significant stenosis. Renals: Will renal arteries are identified bilaterally. The accessory renal arteries bilaterally are diminutive and some as. IMA: Patent without evidence of aneurysm, dissection, vasculitis or significant stenosis. Iliacs: No significant atherosclerotic changes noted. Veins: Within normal limits. Review of the MIP images confirms the above findings. NON-VASCULAR Hepatobiliary: Diffuse fatty infiltration of the liver is noted. The gallbladder is within normal limits. Pancreas: Unremarkable. No pancreatic ductal dilatation or surrounding inflammatory changes. Spleen: Normal in size without focal abnormality. Adrenals/Urinary Tract: The adrenal glands are within normal limits. The kidneys demonstrate no renal  calculi or obstructive changes. A few scattered cysts are noted. The bladder is well distended. Stomach/Bowel: Scattered diverticular change of the colon is noted without evidence of diverticulitis. The appendix has been surgically removed. No obstructive or inflammatory changes are noted. Lymphatic: No significant lymphadenopathy is noted. Reproductive: Prostate is unremarkable. Other: No abdominal wall hernia or abnormality. No abdominopelvic ascites. Musculoskeletal: Degenerative changes of lumbar spine are noted. No acute abnormality is seen. Review of the MIP images confirms the above findings. IMPRESSION: No evidence of pulmonary emboli. No evidence of thoracic aortic dissection. 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 This is of uncertain chronicity but of likely to correspond with the patient's current symptomatology of chest pain. Chronic changes as described above. Electronically Signed   By: Inez Catalina M.D.   On: 11/25/2016 11:37   Ct Angio Abd/pel W And/or Wo Contrast  Result Date: 11/25/2016 CLINICAL DATA:  Chest pain and shortness of breath EXAM: CT ANGIOGRAPHY CHEST, ABDOMEN AND PELVIS TECHNIQUE: Multidetector CT imaging through the chest, abdomen and pelvis was performed using the standard protocol during bolus administration of intravenous contrast. Multiplanar reconstructed images and MIPs were obtained and reviewed to evaluate the vascular anatomy. CONTRAST:  100 mL Isovue 370. COMPARISON:  None. FINDINGS: CTA CHEST FINDINGS Cardiovascular: Thoracic aorta shows mild atherosclerotic changes without aneurysmal dilatation or dissection. The pulmonary artery shows no evidence of pulmonary emboli. No significant cardiac enlargement is seen. No coronary calcifications are noted. Mediastinum/Nodes: The thoracic inlet is within normal limits. No hilar or mediastinal adenopathy is noted. The esophagus is within normal limits. Lungs/Pleura: Lungs  are clear. No pleural effusion or pneumothorax. Musculoskeletal: No chest wall abnormality. No acute or significant osseous findings. Review of the MIP images confirms the above findings. CTA ABDOMEN AND PELVIS FINDINGS VASCULAR Aorta: Mild atherosclerotic changes are noted without aneurysmal dilatation. Intimal defect is noted in the left lateral wall  just below the renal artery is consistent with small dissection. This is not likely related to the patient's current symptomatology of chest pain. Celiac: Patent without evidence of aneurysm, dissection, vasculitis or significant stenosis. SMA: Patent without evidence of aneurysm, dissection, vasculitis or significant stenosis. Renals: Will renal arteries are identified bilaterally. The accessory renal arteries bilaterally are diminutive and some as. IMA: Patent without evidence of aneurysm, dissection, vasculitis or significant stenosis. Iliacs: No significant atherosclerotic changes noted. Veins: Within normal limits. Review of the MIP images confirms the above findings. NON-VASCULAR Hepatobiliary: Diffuse fatty infiltration of the liver is noted. The gallbladder is within normal limits. Pancreas: Unremarkable. No pancreatic ductal dilatation or surrounding inflammatory changes. Spleen: Normal in size without focal abnormality. Adrenals/Urinary Tract: The adrenal glands are within normal limits. The kidneys demonstrate no renal calculi or obstructive changes. A few scattered cysts are noted. The bladder is well distended. Stomach/Bowel: Scattered diverticular change of the colon is noted without evidence of diverticulitis. The appendix has been surgically removed. No obstructive or inflammatory changes are noted. Lymphatic: No significant lymphadenopathy is noted. Reproductive: Prostate is unremarkable. Other: No abdominal wall hernia or abnormality. No abdominopelvic ascites. Musculoskeletal: Degenerative changes of lumbar spine are noted. No acute abnormality is  seen. Review of the MIP images confirms the above findings. IMPRESSION: No evidence of pulmonary emboli. No evidence of thoracic aortic dissection. 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 This is of uncertain chronicity but of likely to correspond with the patient's current symptomatology of chest pain. Chronic changes as described above. Electronically Signed   By: Inez Catalina M.D.   On: 11/25/2016 11:37    Assessment/Plan 1. Chest pain, unspecified type - similar to pain he had that took him to ED 11/26/16 where it was felt to be GI and he was found to have the small tear in his aorta NOT felt to be the cause - EKG 12-Lead done today showed NSR at 78bpm with left anterior fascicular block and left axis deviation (HR improved by time EKG was done) - advised that if pain recurs and persists or worsens, does not respond to GI interventions, recommend he proceed to the ED due to the aortic tear  - will more optimally control bp by adding back the metoprolol succinate at just 25mg   -pt has not take the metoprolol since Dr. Nyoka Cowden started him on losartan but it appears he was never to stop it at all - metoprolol succinate (TOPROL-XL) 25 MG 24 hr tablet; Take 1 tablet (25 mg total) by mouth daily.  Dispense: 90 tablet; Refill: 3  2. Essential hypertension - bp was elevated today, but not as high as his home readings with wrist cuff; he will get an arm cuff and cont to check his bp regularly after starting the metoprolol today after he picks it up (has NOT been taking the 100mg  tablet since losartan was started) - EKG 12-Lead - metoprolol succinate (TOPROL-XL) 25 MG 24 hr tablet; Take 1 tablet (25 mg total) by mouth daily.  Dispense: 90 tablet; Refill: 3 -cont norvasc 5mg  and losartan 100mg  daily  3. Dissection of abdominal aorta (HCC) - noted to have a small tear and it was felt to be too small to warrant repair -he has a f/u with vascular surgery  on 01/06/17 - EKG 12-Lead - metoprolol succinate (TOPROL-XL) 25 MG 24 hr tablet; Take 1 tablet (25 mg total) by mouth daily.  Dispense: 90 tablet; Refill: 3  4.  Abdominal cramps -reports this previously responded to a medication he'd been prescribed and still has some of but is not on med list -he will call back with the name of the medication  Labs/tests ordered:   Orders Placed This Encounter  Procedures  . EKG 12-Lead    Next appt:  12/30/2016 with Janett Billow for BP check  Shatasha Lambing L. Dana Debo, D.O. [facility] [address], [place] [address] Cell Phone (Mon-Fri 8am-5pm):  [phone] On Call:  [phone] & follow prompts after 5pm & weekends Office Phone:  [phone] Office Fax:  [phone]

## 2016-12-21 NOTE — Patient Instructions (Signed)
Start back on metoprolol 25mg --first pill when you get home and then beginning each morning. Continue to check your blood pressure but use an arm cuff.  If bps are staying over 150, call me back or send readings in mychart.  With your abdominal pain, call back or send me the name of the medication you took for cramps.

## 2016-12-22 ENCOUNTER — Encounter: Payer: Self-pay | Admitting: Vascular Surgery

## 2016-12-22 ENCOUNTER — Telehealth: Payer: Self-pay

## 2016-12-22 MED ORDER — GLYCOPYRROLATE 2 MG PO TABS
2.0000 mg | ORAL_TABLET | Freq: Every day | ORAL | 0 refills | Status: DC
Start: 2016-12-22 — End: 2016-12-30

## 2016-12-22 NOTE — Telephone Encounter (Signed)
A refill was sent to pharmacy electronically per Dr. Cyndi Lennert instruction on patient advise request.  Coralyn Mark,  We can call Mr Axelson and send in robinul 2mg  po daily prn abdominal cramps, # 15 no refills. I believe he has some really old ones at home.   TReed

## 2016-12-24 ENCOUNTER — Other Ambulatory Visit: Payer: Self-pay | Admitting: Internal Medicine

## 2016-12-24 DIAGNOSIS — R21 Rash and other nonspecific skin eruption: Secondary | ICD-10-CM

## 2016-12-24 MED ORDER — CLOTRIMAZOLE-BETAMETHASONE 1-0.05 % EX CREA: 1.0000 "application " | TOPICAL_CREAM | Freq: Every day | CUTANEOUS | 3 refills | Status: DC

## 2016-12-30 ENCOUNTER — Ambulatory Visit: Payer: Medicare Other | Admitting: Nurse Practitioner

## 2016-12-30 ENCOUNTER — Ambulatory Visit (INDEPENDENT_AMBULATORY_CARE_PROVIDER_SITE_OTHER): Payer: Medicare Other | Admitting: Nurse Practitioner

## 2016-12-30 ENCOUNTER — Encounter: Payer: Self-pay | Admitting: Nurse Practitioner

## 2016-12-30 VITALS — BP 138/78 | HR 84 | Temp 98.2°F | Resp 17 | Ht 67.0 in | Wt 155.2 lb

## 2016-12-30 DIAGNOSIS — I10 Essential (primary) hypertension: Secondary | ICD-10-CM

## 2016-12-30 DIAGNOSIS — L02214 Cutaneous abscess of groin: Secondary | ICD-10-CM

## 2016-12-30 MED ORDER — DOXYCYCLINE HYCLATE 100 MG PO TABS: 100.0000 mg | ORAL_TABLET | Freq: Two times a day (BID) | ORAL | 0 refills | Status: DC

## 2016-12-30 MED ORDER — CEFTRIAXONE SODIUM 1 G IJ SOLR
1.0000 g | Freq: Once | INTRAMUSCULAR | Status: AC
  Administered 2016-12-30: 1 g via INTRAMUSCULAR

## 2016-12-30 NOTE — Patient Instructions (Signed)
Will start doxycyline 100 mg by mouth twice daily for 10 days To use probiotic (florastor) twice daily for 2 weeks To use dail soap twice daily clean and dry area thoroughly  To use warm compresses 2-3 times daily  Take blood pressure after you have taken your medication

## 2016-12-30 NOTE — Progress Notes (Signed)
Careteam: Patient Care Team: Gayland Curry, DO as PCP - General (Geriatric Medicine)  Advanced Directive information Does Patient Have a Medical Advance Directive?: No  Allergies  Allergen Reactions  . Aciphex [Rabeprazole Sodium]   . Aleve [Naproxen Sodium]   . Effexor [Venlafaxine Hcl]   . Serzone [Nefazodone]   . Vivactil [Protriptyline Hcl]     Chief Complaint  Patient presents with  . Follow-up    Pt is being seen for a follow up on BP after medication changes.    HPI: Patient is a 76 y.o. male seen in the office today to follow up blood pressure. At last visit with Dr Mariea Clonts pt was started back on metoprolol succinate 25 mg daily; apparently he stopped taking the metoprolol succinate 100 mg at some point but was still on his medication list.  Blood pressure readings he brought from home where taken prior to taking his medication. Ranging from 138-144/78-82. Taking norvasc 5 mg daily and lostartan 100 mg daily.    Also notes rash in groin. Has been applying several topical agents and it is not improving.    Review of Systems:  Review of Systems  Constitutional: Negative for chills, fever and malaise/fatigue.  HENT: Positive for hearing loss. Negative for congestion.   Eyes: Negative for blurred vision.       Glasses  Respiratory: Negative for cough and shortness of breath.   Cardiovascular: Negative for chest pain, palpitations, orthopnea, claudication, leg swelling and PND.  Gastrointestinal: Positive for heartburn (improved). Negative for abdominal pain, blood in stool, constipation, diarrhea, melena, nausea and vomiting.  Genitourinary: Negative for dysuria.  Musculoskeletal: Negative for falls and myalgias.       Unsteady gait chronic  Skin: Positive for rash. Negative for itching.  Neurological: Positive for weakness. Negative for dizziness.  Endo/Heme/Allergies: Does not bruise/bleed easily.  Psychiatric/Behavioral: Negative for depression and memory loss.      Past Medical History:  Diagnosis Date  . Abnormality of gait September 07 2007  . Allergic rhinitis, cause unspecified 2007  . Arthritis   . Ataxia   . Cervicalgia February 26, 2006  . Chest pain   . Cramp of limb 10/21/11  . Degeneration of intervertebral disc, site unspecified 1998  . 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 Diaphragmatic hernia without mention of obstruction or gangrene 1982   Hiatal hernia  . Diplopia 2006  . Disturbance of skin sensation 2003  . Diverticulosis of colon (without mention of hemorrhage) 1997  . Dizziness and giddiness 2001  . Dysphagia, unspecified(787.20) 1999 and  . Esophagitis, unspecified 1997  . GERD (gastroesophageal reflux disease) 2003  . Hypertension 2003  . Impotence of organic origin 1999  . Lack of coordination September 07, 2007  . Lumbago 2003  . Migraine with aura, without mention of intractable migraine without mention of status migrainosus 1997  . Myalgia and myositis, unspecified 1999  . Other and unspecified hyperlipidemia 2003  . Other disorders of vitreous 2003  . Other malaise and fatigue 2003  . Other seborrheic keratosis 2013  . Other seborrheic keratosis 04/13/09  . Pain in joint, site unspecified 04/13/12  . Personal history of fall 04/15/11  . Restless legs syndrome (RLS) 1997  . 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 Unspecified hearing loss 2003  . Unspecified tinnitus 04/15/11   Past Surgical History:  Procedure Laterality Date  . APPENDECTOMY  Age 3  . boil removed     Dr.  Wakefield  . C3-4 anterior cervical surgery  1999   Dr. Hal Neer  . CARDIAC CATHETERIZATION  2001   Dr. Glade Lloyd  . COLONOSCOPY  07/20/07   Dr. Sharlett Iles  . EYE SURGERY Bilateral 2015   cataracts  . LAMINOTOMY / EXCISION DISK POSTERIOR CERVICAL SPINE     Spinal Disk (?4-5)   Social History:   reports that he has never smoked. He has quit using smokeless tobacco. His smokeless tobacco  use included Chew. He reports that he drinks alcohol. He reports that he does not use drugs.  Family History  Problem Relation Age of Onset  . Heart disease Father   . Diabetes Neg Hx   . Colon cancer Neg Hx   . Colon polyps Neg Hx   . Esophageal cancer Neg Hx   . Kidney disease Neg Hx   . Gallbladder disease Neg Hx     Medications: Patient's Medications  New Prescriptions   No medications on file  Previous Medications   ACETAMINOPHEN (TYLENOL) 325 MG TABLET    Take 2 tablets (650 mg total) by mouth every 4 (four) hours as needed for headache or mild pain.   AMLODIPINE (NORVASC) 5 MG TABLET    Take 1 tablet (5 mg total) by mouth daily.   CELECOXIB (CELEBREX) 200 MG CAPSULE    Take 1 capsule by mouth every other day.   CLOTRIMAZOLE-BETAMETHASONE (LOTRISONE) CREAM    Apply 1 application topically daily. To groin rash   LOSARTAN (COZAAR) 100 MG TABLET    TAKE 1 TABLET (100 MG TOTAL) BY MOUTH DAILY.   METOPROLOL SUCCINATE (TOPROL-XL) 25 MG 24 HR TABLET    Take 1 tablet (25 mg total) by mouth daily.   MOMETASONE (NASONEX) 50 MCG/ACT NASAL SPRAY    Place 2 sprays into the nose daily. 1 Puff in each nostril for allergies   NYSTATIN CREAM (MYCOSTATIN)    Apply twice daily to the rash   OXYBUTYNIN (DITROPAN) 5 MG TABLET    One up to 3 times daily to help bladder control   PANTOPRAZOLE (PROTONIX) 40 MG TABLET    Take 1 tablet (40 mg total) by mouth daily.   SILDENAFIL (REVATIO) 20 MG TABLET    Take 2-5 tablets prior to intercourse  Modified Medications   No medications on file  Discontinued Medications   GLYCOPYRROLATE (ROBINUL) 2 MG TABLET    Take 1 tablet (2 mg total) by mouth daily.     Physical Exam:  Vitals:   12/30/16 1336  BP: 138/78  Pulse: 84  Resp: 17  Temp: 98.2 F (36.8 C)  TempSrc: Oral  SpO2: 96%  Weight: 155 lb 3.2 oz (70.4 kg)  Height: 5\' 7"  (1.702 m)   Body mass index is 24.31 kg/m.  Physical Exam  Constitutional: He is oriented to person, place, and time.  He appears well-developed and well-nourished.  HENT:  Head: Normocephalic and atraumatic.  Cardiovascular: Normal rate, regular rhythm and normal heart sounds.   Pulmonary/Chest: Effort normal and breath sounds normal. No respiratory distress.  Abdominal: Soft. Bowel sounds are normal.  Musculoskeletal:  Ambulates with three-wheeled walker   Neurological: He is alert and oriented to person, place, and time.  Dysarthria, rhythmic speech, takes short deep breaths between words  Skin: Skin is warm and dry.  Left groin abscess noted with pencil eraser size opening with purulent drainage. Redness to surrounding area Small healing open area noted to left scrotum. No drainage or heat noted  Psychiatric: He has a  normal mood and affect.    Labs reviewed: Basic Metabolic Panel:  Recent Labs  02/28/16 0828 11/25/16 0840 11/26/16 0531  NA 139 138 139  K 4.5 3.9 3.8  CL 105 105 107  CO2 27 25 23   GLUCOSE 94 141* 93  BUN 16 15 15   CREATININE 0.90 1.08 0.91  CALCIUM 9.0 9.2 9.1   Liver Function Tests:  Recent Labs  02/28/16 0828 11/25/16 1854 11/26/16 0531  AST 15 19 16   ALT 14 17 15*  ALKPHOS 106 133* 120  BILITOT 0.8 0.7 1.2  PROT 6.4 6.2* 5.4*  ALBUMIN 4.1 3.8 3.3*    Recent Labs  11/25/16 1854  LIPASE 32   No results for input(s): AMMONIA in the last 8760 hours. CBC:  Recent Labs  11/25/16 0840 11/26/16 0531  WBC 6.1 7.6  HGB 15.8 14.7  HCT 45.1 43.4  MCV 87.7 88.8  PLT 183 161   Lipid Panel:  Recent Labs  02/28/16 0828 11/26/16 0531  CHOL 211* 149  HDL 46 38*  LDLCALC 119 81  TRIG 231* 148  CHOLHDL 4.6 3.9   TSH: No results for input(s): TSH in the last 8760 hours. A1C: No results found for: HGBA1C   Assessment/Plan 1. Essential hypertension Improved, pt taking blood pressure before medication has been taken. To take medication prior to checking blood pressure in the evening. Bring to follow up visit.  -to cont taking lopressor succinate,  norvasc and losartan   2. Abscess of left groin Area open with purulent drainage. Reports hx of infected cyst/abcess that have been hard to heal in the past, reports visit to urgent care and surgery center due to one on buttocks.  - WOUND CULTURE sent  - cefTRIAXone (ROCEPHIN) injection 1 g; Inject 1 g into the muscle once, given today in office.  - doxycycline (VIBRA-TABS) 100 MG tablet; Take 1 tablet (100 mg total) by mouth 2 (two) times daily.  Dispense: 20 tablet; Refill: 0 -to not use creams at this time -clean with dial soap dial BID To use warm compresses 2-3 times daily To follow up in 1 week.  Justin [name]. Harle Battiest  [facility] [phone] 8 am - 5 pm) [phone] (after hours)

## 2017-01-04 LAB — WOUND CULTURE: GRAM STAIN: NONE SEEN

## 2017-01-06 ENCOUNTER — Ambulatory Visit (INDEPENDENT_AMBULATORY_CARE_PROVIDER_SITE_OTHER): Payer: Medicare Other | Admitting: Vascular Surgery

## 2017-01-06 ENCOUNTER — Encounter: Payer: Self-pay | Admitting: Vascular Surgery

## 2017-01-06 VITALS — BP 148/81 | HR 61 | Temp 98.2°F | Resp 20 | Ht 67.0 in | Wt 154.3 lb

## 2017-01-06 DIAGNOSIS — I7102 Dissection of abdominal aorta: Secondary | ICD-10-CM | POA: Diagnosis not present

## 2017-01-06 NOTE — Progress Notes (Signed)
Patient name: Justin [name] MRN: [id number] DOB: 06/05/1940 Sex: male   REASON FOR CONSULT:    Aortic dissection. The consult is requested by Sherrie Mustache, PA  HPI:   Justin [name] is a pleasant 76 y.o. male,  who was having chest pain and shortness of breath. This prompted a CT angiogram of the chest abdomen and pelvis. The CT of the chest did not show any evidence of pulmonary embolus or thoracic aortic dissection. 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 The patient is referred for vascular consultation.   The patient has a history of significant gastroesophageal reflux disease and this was likely the source of his chest pain. He denies any abdominal pain or back pain. There is no family history of aneurysmal disease that he is aware of.  I have reviewed the records that were sent from [facility]. The patient was seen on 12/03/2016 for follow up after a recent hospitalization for chest pain. During that admission the above findings on CT were noted. The patient has essential hypertension which has been under good control. He also have gastroesophageal reflux disease.  Past Medical History:  Diagnosis Date  . Abnormality of gait September 07 2007  . Allergic rhinitis, cause unspecified 2007  . Arthritis   . Ataxia   . Cervicalgia February 26, 2006  . Chest pain   . Cramp of limb 10/21/11  . Degeneration of intervertebral disc, site unspecified 1998  . 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 Diaphragmatic hernia without mention of obstruction or gangrene 1982   Hiatal hernia  . Diplopia 2006  . Disturbance of skin sensation 2003  . Diverticulosis of colon (without mention of hemorrhage) 1997  . Dizziness and giddiness 2001  . Dysphagia, unspecified(787.20) 1999 and  . Esophagitis, unspecified 1997  . GERD (gastroesophageal reflux disease) 2003  . Hypertension 2003  . Impotence of organic origin 1999    . Lack of coordination September 07, 2007  . Lumbago 2003  . Migraine with aura, without mention of intractable migraine without mention of status migrainosus 1997  . Myalgia and myositis, unspecified 1999  . Other and unspecified hyperlipidemia 2003  . Other disorders of vitreous 2003  . Other malaise and fatigue 2003  . Other seborrheic keratosis 2013  . Other seborrheic keratosis 04/13/09  . Pain in joint, site unspecified 04/13/12  . Personal history of fall 04/15/11  . Restless legs syndrome (RLS) 1997  . 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 Unspecified hearing loss 2003  . Unspecified tinnitus 04/15/11    Family History  Problem Relation Age of Onset  . Heart disease Father   . Diabetes Neg Hx   . Colon cancer Neg Hx   . Colon polyps Neg Hx   . Esophageal cancer Neg Hx   . Kidney disease Neg Hx   . Gallbladder disease Neg Hx     SOCIAL HISTORY: He is not a smoker. Social History   Social History  . Marital status: Married    Spouse name: N/A  . Number of children: 2  . Years of education: N/A   Occupational History  . Real Estate    Social History Main Topics  . Smoking status: Never Smoker  . Smokeless tobacco: Former Systems developer    Types: Chew  . Alcohol use 0.0 oz/week     Comment: Occas. Wine   . Drug use: No  . Sexual activity: Yes  Other Topics Concern  . Not on file   Social History Narrative  . No narrative on file    Allergies  Allergen Reactions  . Aciphex [Rabeprazole Sodium]   . Effexor [Venlafaxine Hcl]   . Serzone [Nefazodone]   . Vivactil [Protriptyline Hcl]     Current Outpatient Prescriptions  Medication Sig Dispense Refill  . acetaminophen (TYLENOL) 325 MG tablet Take 2 tablets (650 mg total) by mouth every 4 (four) hours as needed for headache or mild pain. 30 tablet 0  . amLODipine (NORVASC) 5 MG tablet Take 1 tablet (5 mg total) by mouth daily. 30 tablet 0  . celecoxib (CELEBREX) 200 MG capsule Take 1 capsule by  mouth every other day.  3  . clotrimazole-betamethasone (LOTRISONE) cream Apply 1 application topically daily. To groin rash 30 g 3  . doxycycline (VIBRA-TABS) 100 MG tablet Take 1 tablet (100 mg total) by mouth 2 (two) times daily. 20 tablet 0  . losartan (COZAAR) 100 MG tablet TAKE 1 TABLET (100 MG TOTAL) BY MOUTH DAILY. 90 tablet 2  . metoprolol succinate (TOPROL-XL) 25 MG 24 hr tablet Take 1 tablet (25 mg total) by mouth daily. 90 tablet 3  . mometasone (NASONEX) 50 MCG/ACT nasal spray Place 2 sprays into the nose daily. 1 Puff in each nostril for allergies    . nystatin cream (MYCOSTATIN) Apply twice daily to the rash 30 g 3  . oxybutynin (DITROPAN) 5 MG tablet One up to 3 times daily to help bladder control 100 tablet 4  . pantoprazole (PROTONIX) 40 MG tablet Take 1 tablet (40 mg total) by mouth daily. 30 tablet 3  . sildenafil (REVATIO) 20 MG tablet Take 2-5 tablets prior to intercourse 90 tablet 5   No current facility-administered medications for this visit.     REVIEW OF SYSTEMS:  [X]  denotes positive finding, [ ]  denotes negative finding Cardiac  Comments:  Chest pain or chest pressure:    Shortness of breath upon exertion:    Short of breath when lying flat:    Irregular heart rhythm:        Vascular    Pain in calf, thigh, or hip brought on by ambulation:    Pain in feet at night that wakes you up from your sleep:     Blood clot in your veins:    Leg swelling:         Pulmonary    Oxygen at home:    Productive cough:     Wheezing:         Neurologic    Sudden weakness in arms or legs:     Sudden numbness in arms or legs:     Sudden onset of difficulty speaking or slurred speech:    Temporary loss of vision in one eye:     Problems with dizziness:         Gastrointestinal    Blood in stool:     Vomited blood:         Genitourinary    Burning when urinating:     Blood in urine:        Psychiatric    Major depression:         Hematologic    Bleeding  problems:    Problems with blood clotting too easily:        Skin    Rashes or ulcers:        Constitutional    Fever or chills:  PHYSICAL EXAM:   Vitals:   01/06/17 1017  BP: (!) 148/81  Pulse: 61  Resp: 20  Temp: 98.2 F (36.8 C)  TempSrc: Oral  SpO2: 98%  Weight: 154 lb 4.8 oz (70 kg)  Height: 5\' 7"  (1.702 m)    GENERAL: The patient is a well-nourished male, in no acute distress. The vital signs are documented above. CARDIAC: There is a regular rate and rhythm.  VASCULAR: I do not detect carotid bruits. He has palpable femoral pulses bilaterally and a palpable right dorsalis pedis pulse. Both feet are warm and well perfused. He has no significant lower extremity swelling. PULMONARY: There is good air exchange bilaterally without wheezing or rales. ABDOMEN: Soft and non-tender with normal pitched bowel sounds. I do not palpate an abdominal aortic aneurysm. MUSCULOSKELETAL: There are no major deformities or cyanosis. NEUROLOGIC: He has bilateral lower extreme weakness and some expressive aphasia. 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 SKIN: There are no ulcers or rashes noted. PSYCHIATRIC: The patient has a normal affect.  DATA:    CT ANGIOGRAM ABDOMEN AND PELVIS:  I reviewed the CT angiogram of the abdomen and pelvis. This showed a small focal intimal defect in the infrarenal aorta. This is likely chronic.  CHEST CT: I reviewed his CT of the chest which showed no evidence of pulmonary emboli. There was no evidence of thoracic aortic dissection.  MEDICAL ISSUES:   FOCAL INFRARENAL AORTIC DISSECTION: Based on the review of his CT of the abdomen, he does appear to have a focal aortic dissection with no evidence of aneurysmal disease. This is likely chronic. He is asymptomatic. I have simply recommended a follow up ultrasound in 1 year. If there is any evidence of aneurysmal dilatation at that time we could repeat his CT scan, however I'm trying to limit his  exposure to radiation as best as possible. Fortunately, he is not a smoker. I'll see him back in 1 year. He knows to call sooner if he has problems.   Deitra Mayo [facility] [phone]

## 2017-01-07 ENCOUNTER — Encounter: Payer: Self-pay | Admitting: Nurse Practitioner

## 2017-01-07 ENCOUNTER — Ambulatory Visit (INDEPENDENT_AMBULATORY_CARE_PROVIDER_SITE_OTHER): Payer: Medicare Other | Admitting: Nurse Practitioner

## 2017-01-07 VITALS — BP 132/76 | HR 74 | Temp 98.2°F | Resp 17 | Ht 67.0 in | Wt 155.0 lb

## 2017-01-07 DIAGNOSIS — H04123 Dry eye syndrome of bilateral lacrimal glands: Secondary | ICD-10-CM | POA: Diagnosis not present

## 2017-01-07 DIAGNOSIS — I10 Essential (primary) hypertension: Secondary | ICD-10-CM

## 2017-01-07 DIAGNOSIS — L02214 Cutaneous abscess of groin: Secondary | ICD-10-CM | POA: Diagnosis not present

## 2017-01-07 DIAGNOSIS — H16221 Keratoconjunctivitis sicca, not specified as Sjogren's, right eye: Secondary | ICD-10-CM | POA: Diagnosis not present

## 2017-01-07 MED ORDER — AMLODIPINE BESYLATE 5 MG PO TABS: 5.0000 mg | ORAL_TABLET | Freq: Every day | ORAL | 1 refills | Status: DC

## 2017-01-07 MED ORDER — METOPROLOL SUCCINATE ER 25 MG PO TB24: 25.0000 mg | ORAL_TABLET | Freq: Every day | ORAL | 1 refills | Status: DC

## 2017-01-07 NOTE — Patient Instructions (Addendum)
Cont Doxycyline  If once complete area become tender, red, warm make appt immediately for evaluation.

## 2017-01-07 NOTE — Progress Notes (Signed)
Careteam: Patient Care Team: Gayland Curry, DO as PCP - General (Geriatric Medicine)  Advanced Directive information Does Patient Have a Medical Advance Directive?: No  Allergies  Allergen Reactions  . Aciphex [Rabeprazole Sodium]   . Effexor [Venlafaxine Hcl]   . Serzone [Nefazodone]   . Vivactil [Protriptyline Hcl]     Chief Complaint  Patient presents with  . Follow-up    Pt is being seen to follow up on groin abcess. Pt reports that there has been no drainage since starting antibiotics.      HPI: Patient is a 76 y.o. male seen in the office today to follow up abscess to groin.  Last week at [place] noted place on groin, found to have draininage abscess, culture revealed MRSA. He was given rocephin in office and Rx for doxycyline. He has 2 days left of doxycyline and notes area has improved.  Still firm at site but tenderness and redness improved and area has closed up.  No fevers.   Review of Systems:  Review of Systems  Constitutional: Negative for chills, fever and malaise/fatigue.  HENT: Positive for hearing loss. Negative for congestion.   Eyes: Negative for blurred vision.       Glasses  Respiratory: Negative for cough and shortness of breath.   Cardiovascular: Negative for chest pain.  Genitourinary: Negative for dysuria.  Musculoskeletal: Negative for falls and myalgias.       Unsteady gait chronic  Skin: Negative for itching and rash.  Neurological: Positive for weakness. Negative for dizziness.  Endo/Heme/Allergies: Does not bruise/bleed easily.  Psychiatric/Behavioral: Negative for depression and memory loss.    Past Medical History:  Diagnosis Date  . Abnormality of gait September 07 2007  . Allergic rhinitis, cause unspecified 2007  . Arthritis   . Ataxia   . Cervicalgia February 26, 2006  . Chest pain   . Cramp of limb 10/21/11  . Degeneration of intervertebral disc, site unspecified 1998  . Degeneration of thoracic or thoracolumbar intervertebral  disc 03/06/12  . Diaphragmatic hernia without mention of obstruction or gangrene 1982   Hiatal hernia  . Diplopia 2006  . Disturbance of skin sensation 2003  . Diverticulosis of colon (without mention of hemorrhage) 1997  . Dizziness and giddiness 2001  . Dysphagia, unspecified(787.20) 1999 and  . Esophagitis, unspecified 1997  . GERD (gastroesophageal reflux disease) 2003  . Hypertension 2003  . Impotence of organic origin 1999  . Lack of coordination September 07, 2007  . Lumbago 2003  . Migraine with aura, without mention of intractable migraine without mention of status migrainosus 1997  . Myalgia and myositis, unspecified 1999  . Other and unspecified hyperlipidemia 2003  . Other disorders of vitreous 2003  . Other malaise and fatigue 2003  . Other seborrheic keratosis 2013  . Other seborrheic keratosis 04/13/09  . Pain in joint, site unspecified 04/13/12  . Personal history of fall 04/15/11  . Restless legs syndrome (RLS) 1997  . 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 Unspecified hearing loss 2003  . Unspecified tinnitus 04/15/11   Past Surgical History:  Procedure Laterality Date  . APPENDECTOMY  Age 65  . boil removed     Dr. Donne Hazel  . C3-4 anterior cervical surgery  1999   Dr. Hal Neer  . CARDIAC CATHETERIZATION  2001   Dr. Glade Lloyd  . COLONOSCOPY  07/20/07   Dr. Sharlett Iles  . EYE SURGERY Bilateral 2015   cataracts  . LAMINOTOMY / EXCISION DISK POSTERIOR CERVICAL  SPINE     Spinal Disk (?4-5)   Social History:   reports that he has never smoked. He has quit using smokeless tobacco. His smokeless tobacco use included Chew. He reports that he drinks alcohol. He reports that he does not use drugs.  Family History  Problem Relation Age of Onset  . Heart disease Father   . Diabetes Neg Hx   . Colon cancer Neg Hx   . Colon polyps Neg Hx   . Esophageal cancer Neg Hx   . Kidney disease Neg Hx   . Gallbladder disease Neg Hx      Medications: Patient's Medications  New Prescriptions   No medications on file  Previous Medications   ACETAMINOPHEN (TYLENOL) 325 MG TABLET    Take 2 tablets (650 mg total) by mouth every 4 (four) hours as needed for headache or mild pain.   AMLODIPINE (NORVASC) 5 MG TABLET    Take 1 tablet (5 mg total) by mouth daily.   CELECOXIB (CELEBREX) 200 MG CAPSULE    Take 1 capsule by mouth every other day.   CLOTRIMAZOLE-BETAMETHASONE (LOTRISONE) CREAM    Apply 1 application topically daily. To groin rash   DOXYCYCLINE (VIBRA-TABS) 100 MG TABLET    Take 1 tablet (100 mg total) by mouth 2 (two) times daily.   LOSARTAN (COZAAR) 100 MG TABLET    TAKE 1 TABLET (100 MG TOTAL) BY MOUTH DAILY.   METOPROLOL SUCCINATE (TOPROL-XL) 25 MG 24 HR TABLET    Take 1 tablet (25 mg total) by mouth daily.   MOMETASONE (NASONEX) 50 MCG/ACT NASAL SPRAY    Place 2 sprays into the nose daily. 1 Puff in each nostril for allergies   OXYBUTYNIN (DITROPAN) 5 MG TABLET    One up to 3 times daily to help bladder control   PANTOPRAZOLE (PROTONIX) 40 MG TABLET    Take 1 tablet (40 mg total) by mouth daily.   SILDENAFIL (REVATIO) 20 MG TABLET    Take 2-5 tablets prior to intercourse  Modified Medications   No medications on file  Discontinued Medications   NYSTATIN CREAM (MYCOSTATIN)    Apply twice daily to the rash     Physical Exam:  Vitals:   01/07/17 1519  BP: 132/76  Pulse: 74  Resp: 17  Temp: 98.2 F (36.8 C)  TempSrc: Oral  SpO2: 99%  Weight: 155 lb (70.3 kg)  Height: 5\' 7"  (1.702 m)   Body mass index is 24.28 kg/m.  Physical Exam  Constitutional: He is oriented to person, place, and time. He appears well-developed and well-nourished.  HENT:  Head: Normocephalic and atraumatic.  Cardiovascular: Normal rate, regular rhythm and normal heart sounds.   Pulmonary/Chest: Effort normal and breath sounds normal. No respiratory distress.  Abdominal: Soft. Bowel sounds are normal.  Musculoskeletal:   Ambulates with three-wheeled walker   Neurological: He is alert and oriented to person, place, and time.  Dysarthria, rhythmic speech, takes short deep breaths between words  Skin: Skin is warm and dry.  Left groin- healed area without erythema or tenderness, firm area under the skin.   Psychiatric: He has a normal mood and affect.    Labs reviewed: Basic Metabolic Panel:  Recent Labs  02/28/16 0828 11/25/16 0840 11/26/16 0531  NA 139 138 139  K 4.5 3.9 3.8  CL 105 105 107  CO2 27 25 23   GLUCOSE 94 141* 93  BUN 16 15 15   CREATININE 0.90 1.08 0.91  CALCIUM 9.0 9.2 9.1  Liver Function Tests:  Recent Labs  02/28/16 0828 11/25/16 1854 11/26/16 0531  AST 15 19 16   ALT 14 17 15*  ALKPHOS 106 133* 120  BILITOT 0.8 0.7 1.2  PROT 6.4 6.2* 5.4*  ALBUMIN 4.1 3.8 3.3*    Recent Labs  11/25/16 1854  LIPASE 32   No results for input(s): AMMONIA in the last 8760 hours. CBC:  Recent Labs  11/25/16 0840 11/26/16 0531  WBC 6.1 7.6  HGB 15.8 14.7  HCT 45.1 43.4  MCV 87.7 88.8  PLT 183 161   Lipid Panel:  Recent Labs  02/28/16 0828 11/26/16 0531  CHOL 211* 149  HDL 46 38*  LDLCALC 119 81  TRIG 231* 148  CHOLHDL 4.6 3.9   TSH: No results for input(s): TSH in the last 8760 hours. A1C: No results found for: HGBA1C   Assessment/Plan 1. Abscess of left groin Improving, to complete doxycycline, return precautions discussed with pt.   2. Essential hypertension -improved, to cont norvasc, losartan and metoprolol  - amLODipine (NORVASC) 5 MG tablet; Take 1 tablet (5 mg total) by mouth daily.  Dispense: 90 tablet; Refill: 1 - metoprolol succinate (TOPROL-XL) 25 MG 24 hr tablet; Take 1 tablet (25 mg total) by mouth daily.  Dispense: 90 tablet; Refill: 1   Next appt: as scheduled.  Carlos American. Harle Battiest  [facility] [phone] 8 am - 5 pm) [phone] (after hours)

## 2017-01-08 ENCOUNTER — Other Ambulatory Visit: Payer: Self-pay | Admitting: Internal Medicine

## 2017-01-12 ENCOUNTER — Other Ambulatory Visit: Payer: Self-pay | Admitting: Nurse Practitioner

## 2017-01-12 DIAGNOSIS — I10 Essential (primary) hypertension: Secondary | ICD-10-CM

## 2017-01-15 NOTE — Addendum Note (Signed)
Addended by: Lianne Cure A on: 01/15/2017 03:46 PM   Modules accepted: Orders

## 2017-01-20 ENCOUNTER — Encounter: Payer: Self-pay | Admitting: Nurse Practitioner

## 2017-01-20 ENCOUNTER — Telehealth: Payer: Self-pay

## 2017-01-20 NOTE — Telephone Encounter (Signed)
I called patient to let him know of [name]'s response to pt advise request sent today. I left a message asking that he call the office so that I can confirm that he received message.   Can apply warm compress, episom salt baths twice daily and massage area. If becomes larger and painful will need office visit.   ----- Message -----  From: [name], CMA  Sent: 01/20/2017 12:04 PM  To: [name], NP  Subject: FW: Non-Urgent Medical Question               ----- Message -----  From: [name]  Sent: 01/20/2017 11:31 AM  To: [facility] Clinical Pool  Subject: Non-Urgent Medical Question             New Boil , same area, just popped up, red area small head like it turns into full boil,  any suggestion.    Thanks  Justin [name]

## 2017-02-25 ENCOUNTER — Other Ambulatory Visit: Payer: Self-pay | Admitting: Internal Medicine

## 2017-02-25 DIAGNOSIS — N3941 Urge incontinence: Secondary | ICD-10-CM

## 2017-03-19 ENCOUNTER — Other Ambulatory Visit: Payer: Self-pay | Admitting: Nurse Practitioner

## 2017-03-19 DIAGNOSIS — K219 Gastro-esophageal reflux disease without esophagitis: Secondary | ICD-10-CM

## 2017-04-01 ENCOUNTER — Encounter: Payer: Self-pay | Admitting: Internal Medicine

## 2017-04-01 ENCOUNTER — Ambulatory Visit (INDEPENDENT_AMBULATORY_CARE_PROVIDER_SITE_OTHER): Payer: Medicare Other | Admitting: Internal Medicine

## 2017-04-01 VITALS — BP 120/70 | HR 62 | Temp 98.3°F | Wt 157.0 lb

## 2017-04-01 DIAGNOSIS — Z7189 Other specified counseling: Secondary | ICD-10-CM | POA: Diagnosis not present

## 2017-04-01 DIAGNOSIS — I10 Essential (primary) hypertension: Secondary | ICD-10-CM | POA: Diagnosis not present

## 2017-04-01 DIAGNOSIS — M545 Low back pain, unspecified: Secondary | ICD-10-CM

## 2017-04-01 DIAGNOSIS — G8929 Other chronic pain: Secondary | ICD-10-CM

## 2017-04-01 DIAGNOSIS — Z23 Encounter for immunization: Secondary | ICD-10-CM | POA: Diagnosis not present

## 2017-04-01 DIAGNOSIS — I7102 Dissection of abdominal aorta: Secondary | ICD-10-CM | POA: Diagnosis not present

## 2017-04-01 DIAGNOSIS — R2689 Other abnormalities of gait and mobility: Secondary | ICD-10-CM

## 2017-04-01 DIAGNOSIS — N3941 Urge incontinence: Secondary | ICD-10-CM | POA: Diagnosis not present

## 2017-04-01 DIAGNOSIS — K219 Gastro-esophageal reflux disease without esophagitis: Secondary | ICD-10-CM | POA: Diagnosis not present

## 2017-04-01 DIAGNOSIS — F325 Major depressive disorder, single episode, in full remission: Secondary | ICD-10-CM

## 2017-04-01 DIAGNOSIS — G119 Hereditary ataxia, unspecified: Secondary | ICD-10-CM | POA: Diagnosis not present

## 2017-04-01 NOTE — Progress Notes (Signed)
Location:  [facility] Provider:  Eyvonne Burchfield L. Mariea Clonts, D.O., C.M.D.  Code Status: DNR Goals of Care:  Advanced Directives 01/07/2017  Does Patient Have a Medical Advance Directive? No  Type of Advance Directive -  Does patient want to make changes to medical advance directive? -  Copy of [place] in Chart? -  Would patient like information on creating a medical advance directive? -  He says he has a living will and health care power of attorney in place.  His attorney is currently his HCPOA; reports that his parents lived to 36 and 22.  Says he would not want to be kept alive in the event that his mind was not clear or he had a major stroke or heart attack.    Chief Complaint  Patient presents with  . Medical Management of Chronic Issues    36mth follow-up    HPI: Patient is a 76 y.o. male seen today for medical management of chronic diseases.    Abscess left groin resolved.  He describes it as boils.    BP has been good, too.  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  For repeat US in 1 year from July 2018.  If changed, then would need CT.    Protonix is working well for his reflux.  It says don't take more than 2 years.  Says right now, we're ok and no need to change to nexium.    Joint pains are usually controlled.  Sometimes takes the celebrex only every other day for pain.  Hydrocodone given back in April, but it's not on his list.  He got a year's supply.  Uses as needed which is roughly 3x in one month.  Uses if he has a real bad headache.    Takes his oxybutynin once a day--if takes more than one, he gets constipated.  Notices that he has to wait a few minutes if he gets the urge to go before any urine comes out.  Happens once in a while. He does not see urology.    Lately, he has to get up about 3am to urinate.  If he drinks a cup of coffee at 6 or 6:30pm, he has to get up.  If he does not drink it, he gets through the night.    Sleeps well  at night.   Appetite is good.  Balance is still poor.  Speech in afternoon if he has spoken a lot in the am, slows down.  It's hard for others to understand him on the phone.  Has difficulty going through the drive-thru.  He says he sat down, but did not otherwise fall.  No major injury.  He's good about using his walker.  If he didn't, he would fall.  He furniture surfs at home.    Says he's been in good spirits.  Nothing wrong with his mood.  Says he has no depression.   Tries to keep his mind busy.  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  Uses his computer a lot.  Now just looking after things from the past.    Past Medical History:  Diagnosis Date  . Abnormality of gait September 07 2007  . Allergic rhinitis,  cause unspecified 2007  . Arthritis   . Ataxia   . Cervicalgia February 26, 2006  . Chest pain   . Cramp of limb 10/21/11  . Degeneration of intervertebral disc, site unspecified 1998  . 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 Diaphragmatic hernia without mention of obstruction or gangrene 1982   Hiatal hernia  . Diplopia 2006  . Disturbance of skin sensation 2003  . Diverticulosis of colon (without mention of hemorrhage) 1997  . Dizziness and giddiness 2001  . Dysphagia, unspecified(787.20) 1999 and  . Esophagitis, unspecified 1997  . GERD (gastroesophageal reflux disease) 2003  . Hypertension 2003  . Impotence of organic origin 1999  . Lack of coordination September 07, 2007  . Lumbago 2003  . Migraine with aura, without mention of intractable migraine without mention of status migrainosus 1997  . Myalgia and myositis, unspecified 1999  . Other and unspecified hyperlipidemia 2003  .  Other disorders of vitreous 2003  . Other malaise and fatigue 2003  . Other seborrheic keratosis 2013  . Other seborrheic keratosis 04/13/09  . Pain in joint, site unspecified 04/13/12  . Personal history of fall 04/15/11  . Restless legs syndrome (RLS) 1997  . 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 Unspecified hearing loss 2003  . Unspecified tinnitus 04/15/11    Past Surgical History:  Procedure Laterality Date  . APPENDECTOMY  Age 43  . boil removed     Dr. Donne Hazel  . C3-4 anterior cervical surgery  1999   Dr. Hal Neer  . CARDIAC CATHETERIZATION  2001   Dr. Glade Lloyd  . COLONOSCOPY  07/20/07   Dr. Sharlett Iles  . EYE SURGERY Bilateral 2015   cataracts  . LAMINOTOMY / EXCISION DISK POSTERIOR CERVICAL SPINE     Spinal Disk (?4-5)    Allergies  Allergen Reactions  . Aciphex [Rabeprazole Sodium]   . Effexor [Venlafaxine Hcl]   . Serzone [Nefazodone]   . Vivactil [Protriptyline Hcl]     Outpatient Encounter Prescriptions as of 04/01/2017  Medication Sig  . amLODipine (NORVASC) 5 MG tablet TAKE 1 TABLET BY MOUTH EVERY DAY  . celecoxib (CELEBREX) 200 MG capsule Take 1 capsule by mouth every other day.  . clotrimazole-betamethasone (LOTRISONE) cream Apply 1 application topically daily. To groin rash  . losartan (COZAAR) 100 MG tablet TAKE 1 TABLET (100 MG TOTAL) BY MOUTH DAILY.  . metoprolol succinate (TOPROL-XL) 25 MG 24 hr tablet Take 1 tablet (25 mg total) by mouth daily.  . mometasone (NASONEX) 50 MCG/ACT nasal spray Place 2 sprays into the nose daily. 1 Puff in each nostril for allergies  . oxybutynin (DITROPAN) 5 MG tablet Take 5 mg by mouth daily.  . pantoprazole (PROTONIX) 40 MG tablet TAKE 1 TABLET BY MOUTH EVERY DAY  . sildenafil (REVATIO) 20 MG tablet Take 2-5 tablets prior to intercourse  . [DISCONTINUED] acetaminophen (TYLENOL) 325 MG tablet Take 2 tablets (650 mg total) by mouth every 4 (four) hours as needed for headache or mild pain.  .  [DISCONTINUED] doxycycline (VIBRA-TABS) 100 MG tablet Take 1 tablet (100 mg total) by mouth 2 (two) times daily.  . [DISCONTINUED] oxybutynin (DITROPAN) 5 MG tablet TAKE 1 TABLET UP TO 3 TIMES A DAY TO HELP BLADDER CONTROL   No facility-administered encounter medications on file as of 04/01/2017.     Review of Systems:  Review of Systems  Constitutional: Negative for chills, fever and malaise/fatigue.  HENT: Positive for hearing loss.   Eyes: Negative  for blurred vision.       Glasses  Respiratory: Negative for cough and shortness of breath.   Cardiovascular: Negative for chest pain, palpitations and leg swelling.  Gastrointestinal: Positive for heartburn. Negative for abdominal pain.  Genitourinary: Positive for urgency. Negative for dysuria, flank pain, frequency and hematuria.       Some difficulty starting stream  Musculoskeletal: Positive for back pain, falls and joint pain.  Skin: Negative for rash.       Reports resolution of rash and boils  Neurological: Positive for sensory change and headaches. Negative for dizziness, loss of consciousness and weakness.       Unsteady gait with frequent falls backward, uses three wheeled rollator with basket  Psychiatric/Behavioral: Negative for depression and memory loss. The patient is not nervous/anxious and does not have insomnia.     Health Maintenance  Topic Date Due  . PNA vac Low Risk Adult (2 of 2 - PPSV23) 10/12/2014  . INFLUENZA VACCINE  12/30/2016  . COLONOSCOPY  07/19/2017  . TETANUS/TDAP  06/11/2021    Physical Exam: Vitals:   04/01/17 1010  BP: 120/70  Pulse: 62  Temp: 98.3 F (36.8 C)  TempSrc: Oral  SpO2: 99%  Weight: 157 lb (71.2 kg)   Body mass index is 24.59 kg/m. Physical Exam  Constitutional: He is oriented to person, place, and time. He appears well-developed and well-nourished. No distress.  HENT:  Head: Normocephalic and atraumatic.  Eyes: Pupils are equal, round, and reactive to light. EOM are  normal.  glasses  Cardiovascular: Normal rate, regular rhythm, normal heart sounds and intact distal pulses.   Midsystolic click audible today  Pulmonary/Chest: Effort normal and breath sounds normal. No respiratory distress.  Abdominal: Soft. Bowel sounds are normal.  Musculoskeletal:  Unsteady gait, uses three-wheeled walker  Neurological: He is alert and oriented to person, place, and time. A sensory deficit is present. No cranial nerve deficit. He exhibits abnormal muscle tone. Coordination abnormal.  Dysarthria, slow speech to be understood  Skin: Skin is warm and dry. Capillary refill takes less than 2 seconds.  Psychiatric: He has a normal mood and affect.    Labs reviewed: Basic Metabolic Panel:  Recent Labs  11/25/16 0840 11/26/16 0531  NA 138 139  K 3.9 3.8  CL 105 107  CO2 25 23  GLUCOSE 141* 93  BUN 15 15  CREATININE 1.08 0.91  CALCIUM 9.2 9.1   Liver Function Tests:  Recent Labs  11/25/16 1854 11/26/16 0531  AST 19 16  ALT 17 15*  ALKPHOS 133* 120  BILITOT 0.7 1.2  PROT 6.2* 5.4*  ALBUMIN 3.8 3.3*    Recent Labs  11/25/16 1854  LIPASE 32   No results for input(s): AMMONIA in the last 8760 hours. CBC:  Recent Labs  11/25/16 0840 11/26/16 0531  WBC 6.1 7.6  HGB 15.8 14.7  HCT 45.1 43.4  MCV 87.7 88.8  PLT 183 161   Lipid Panel:  Recent Labs  11/26/16 0531  CHOL 149  HDL 38*  LDLCALC 81  TRIG 148  CHOLHDL 3.9  Reviewed notes from NP, vascular appts.  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 Gastroesophageal reflux disease without esophagitis -continues protonix which has been effective  3. 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 Essential hypertension -bp at goal with current therapy (three meds), no changes and monitor - CBC with Differential/Platelet; Future - COMPLETE METABOLIC PANEL WITH GFR; Future - Lipid panel; Future  5. Chronic midline low back pain without sciatica -controlled, uses celebrex qod to qd and hydrocodone three times monthly on avg but more for headache  6. Major depressive disorder with single episode, in full remission ([place]) -resolved, says spirits are good and no problems in this area, not sure when the positive phq occurred that triggered this  7. Urgency incontinence -continues on oxybutynin which constipates him, also having some difficulty starting to urinate at times now, will monitor and refer to urology if persists, would favor using myrbetriq in place of oxybutynin to avoid the anticholinergic side effects  8. Neurodegenerative gait disorder -ongoing, cont rollator use  9. . Need for 23-polyvalent pneumococcal polysaccharide vaccine - Pneumococcal polysaccharide vaccine 23-valent greater than or equal to 2yo subcutaneous/IM due since last one was at 63  11. ACP (advance care planning) -discussed code status today with him for 17 mins and reviewed fact that he does have living will and hcpoa which we need copies of -he agrees to provide these -he does request DNR status--would not want CPR done if he tried to pass away -goldenrod done today and given to him, copy made for scanning  Labs/tests ordered:   Orders Placed This Encounter  Procedures  . Pneumococcal polysaccharide vaccine 23-valent greater than or equal to 2yo subcutaneous/IM  . CBC with Differential/Platelet    Standing Status:   Future    Standing Expiration Date:   04/01/2018  . COMPLETE METABOLIC PANEL WITH GFR    Standing Status:   Future    Standing Expiration Date:   04/01/2018  . Lipid panel    Standing Status:   Future    Standing Expiration Date:   04/01/2018   Next appt:  6 mos med mgt,  labs before  Jeorgia Helming L. Andersen Iorio, D.O. [facility] [address], [place] [address] Cell Phone (Mon-Fri 8am-5pm):  [phone] On Call:  [phone] & follow prompts after 5pm & weekends Office Phone:  [phone] Office Fax:  [phone]

## 2017-04-01 NOTE — Patient Instructions (Addendum)
Please get me a copy of your health care power of attorney and living will documents.

## 2017-04-05 ENCOUNTER — Other Ambulatory Visit: Payer: Self-pay

## 2017-04-05 DIAGNOSIS — I10 Essential (primary) hypertension: Secondary | ICD-10-CM

## 2017-04-05 MED ORDER — AMLODIPINE BESYLATE 5 MG PO TABS: 5.0000 mg | ORAL_TABLET | Freq: Every day | ORAL | 3 refills | Status: DC

## 2017-04-05 NOTE — Telephone Encounter (Signed)
A refill request was received from [facility] for amlodipine 5 mg tablets. Rx was sent to pharmacy electronically.

## 2017-04-25 ENCOUNTER — Other Ambulatory Visit: Payer: Self-pay | Admitting: Internal Medicine

## 2017-04-25 DIAGNOSIS — N3941 Urge incontinence: Secondary | ICD-10-CM

## 2017-04-29 ENCOUNTER — Encounter: Payer: Self-pay | Admitting: Nurse Practitioner

## 2017-04-29 ENCOUNTER — Ambulatory Visit (INDEPENDENT_AMBULATORY_CARE_PROVIDER_SITE_OTHER): Payer: Medicare Other | Admitting: Nurse Practitioner

## 2017-04-29 VITALS — BP 116/72 | HR 61 | Temp 97.7°F | Resp 16 | Ht 67.0 in | Wt 154.0 lb

## 2017-04-29 DIAGNOSIS — I872 Venous insufficiency (chronic) (peripheral): Secondary | ICD-10-CM

## 2017-04-29 DIAGNOSIS — S93402A Sprain of unspecified ligament of left ankle, initial encounter: Secondary | ICD-10-CM

## 2017-04-29 DIAGNOSIS — R600 Localized edema: Secondary | ICD-10-CM

## 2017-04-29 NOTE — Progress Notes (Signed)
Careteam: Patient Care Team: Gayland Curry, DO as PCP - General (Geriatric Medicine)  Advanced Directive information Does Patient Have a Medical Advance Directive?: Yes, Type of Advance Directive: Out of facility DNR (pink MOST or yellow form), Pre-existing out of facility DNR order (yellow form or pink MOST form): Yellow form placed in chart (order not valid for inpatient use)  Allergies  Allergen Reactions  . Aciphex [Rabeprazole Sodium]   . Effexor [Venlafaxine Hcl]   . Serzone [Nefazodone]   . Vivactil [Protriptyline Hcl]     Chief Complaint  Patient presents with  . Acute Visit    Pt is being seen due to possible left ankle sprain about 5 weeks ago that is still painful.      HPI: Patient is a 76 y.o. male seen in the office today due to pain in left ankle. Pt reports he was pulling his wife in the [employer] and they have a small step from the kitchen to the living room and as he was pulling her back lost balance and WC hit his leg and he rolled his left ankle outwards 5 weeks ago. After about 3 days it got black and blue across the bottom of it and then had increase pain to lateral aspect. The swelling has gone down but still notices it to back and on side of ankle. Feels like this should be gone at this time.  Pain is a 2/10 and this is only when he is twisting it to get his shoe on or applying increased pressure otherwise it does not bother him.  He is on his feet all day. No problem with walking or pain with walking.  Swelling worse in the evening.    Review of Systems:  Review of Systems  Constitutional: Negative for chills and fever.  Respiratory: Negative for shortness of breath.   Cardiovascular: Negative for chest pain and leg swelling.  Musculoskeletal: Positive for falls, joint pain and myalgias.  Neurological: Negative for weakness.    Past Medical History:  Diagnosis Date  . Abnormality of gait September 07 2007  . Allergic rhinitis, cause unspecified 2007  .  Arthritis   . Ataxia   . Cervicalgia February 26, 2006  . Chest pain   . Cramp of limb 10/21/11  . Degeneration of intervertebral disc, site unspecified 1998  . 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 Diaphragmatic hernia without mention of obstruction or gangrene 1982   Hiatal hernia  . Diplopia 2006  . Disturbance of skin sensation 2003  . Diverticulosis of colon (without mention of hemorrhage) 1997  . Dizziness and giddiness 2001  . Dysphagia, unspecified(787.20) 1999 and  . Esophagitis, unspecified 1997  . GERD (gastroesophageal reflux disease) 2003  . Hypertension 2003  . Impotence of organic origin 1999  . Lack of coordination September 07, 2007  . Lumbago 2003  . Migraine with aura, without mention of intractable migraine without mention of status migrainosus 1997  . Myalgia and myositis, unspecified 1999  . Other and unspecified hyperlipidemia 2003  . Other disorders of vitreous 2003  . Other malaise and fatigue 2003  . Other seborrheic keratosis 2013  . Other seborrheic keratosis 04/13/09  . Pain in joint, site unspecified 04/13/12  . Personal history of fall 04/15/11  . Restless legs syndrome (RLS) 1997  . 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 Unspecified hearing loss 2003  . Unspecified tinnitus 04/15/11   Past Surgical History:  Procedure Laterality Date  .  APPENDECTOMY  Age 57  . boil removed     Dr. Donne Hazel  . C3-4 anterior cervical surgery  1999   Dr. Hal Neer  . CARDIAC CATHETERIZATION  2001   Dr. Glade Lloyd  . COLONOSCOPY  07/20/07   Dr. Sharlett Iles  . EYE SURGERY Bilateral 2015   cataracts  . LAMINOTOMY / EXCISION DISK POSTERIOR CERVICAL SPINE     Spinal Disk (?4-5)   Social History:   reports that  has never smoked. He has quit using smokeless tobacco. His smokeless tobacco use included chew. He reports that he drinks alcohol. He reports that he does not use drugs.  Family History  Problem Relation Age  of Onset  . Heart disease Father   . Diabetes Neg Hx   . Colon cancer Neg Hx   . Colon polyps Neg Hx   . Esophageal cancer Neg Hx   . Kidney disease Neg Hx   . Gallbladder disease Neg Hx     Medications:   Medication List        Accurate as of 04/29/17  3:18 PM. Always use your most recent med list.          amLODipine 5 MG tablet Commonly known as:  NORVASC Take 1 tablet (5 mg total) daily by mouth.   celecoxib 200 MG capsule Commonly known as:  CELEBREX   clotrimazole-betamethasone cream Commonly known as:  LOTRISONE Apply 1 application topically daily. To groin rash   losartan 100 MG tablet Commonly known as:  COZAAR TAKE 1 TABLET (100 MG TOTAL) BY MOUTH DAILY.   metoprolol succinate 25 MG 24 hr tablet Commonly known as:  TOPROL-XL Take 1 tablet (25 mg total) by mouth daily.   mometasone 50 MCG/ACT nasal spray Commonly known as:  NASONEX   oxybutynin 5 MG tablet Commonly known as:  DITROPAN TAKE 1 TABLET BY MOUTH UP TO 3 TIMES A DAY TO HELP BLADDER CONTROL   pantoprazole 40 MG tablet Commonly known as:  PROTONIX TAKE 1 TABLET BY MOUTH EVERY DAY   sildenafil 20 MG tablet Commonly known as:  REVATIO Take 2-5 tablets prior to intercourse        Physical Exam:  Vitals:   04/29/17 1510  BP: 116/72  Pulse: 61  Resp: 16  Temp: 97.7 F (36.5 C)  TempSrc: Oral  SpO2: 98%  Weight: 154 lb (69.9 kg)  Height: 5\' 7"  (1.702 m)   Body mass index is 24.12 kg/m.  Physical Exam  Constitutional: He is oriented to person, place, and time. He appears well-developed and well-nourished. No distress.  Eyes:  glasses  Cardiovascular: Normal rate, regular rhythm, normal heart sounds and intact distal pulses.  Pulmonary/Chest: Effort normal and breath sounds normal. No respiratory distress.  Musculoskeletal: Normal range of motion. He exhibits edema (trace edema to right, 1+ to left ankle and leg) and tenderness (noted to lateral aspect of ankle on exam).    Unsteady gait- at baseline, uses three-wheeled walker which he forgot today and using walker from office  Neurological: He is alert and oriented to person, place, and time. He displays abnormal reflex. A sensory deficit is present. No cranial nerve deficit. He exhibits abnormal muscle tone. Coordination abnormal.  Dysarthria, slow speech to be understood  Skin: Skin is warm and dry. Capillary refill takes less than 2 seconds.  Psychiatric: He has a normal mood and affect.    Labs reviewed: Basic Metabolic Panel: Recent Labs    11/25/16 0840 11/26/16 0531  NA 138 139  K 3.9 3.8  CL 105 107  CO2 25 23  GLUCOSE 141* 93  BUN 15 15  CREATININE 1.08 0.91  CALCIUM 9.2 9.1   Liver Function Tests: Recent Labs    11/25/16 1854 11/26/16 0531  AST 19 16  ALT 17 15*  ALKPHOS 133* 120  BILITOT 0.7 1.2  PROT 6.2* 5.4*  ALBUMIN 3.8 3.3*   Recent Labs    11/25/16 1854  LIPASE 32   No results for input(s): AMMONIA in the last 8760 hours. CBC: Recent Labs    11/25/16 0840 11/26/16 0531  WBC 6.1 7.6  HGB 15.8 14.7  HCT 45.1 43.4  MCV 87.7 88.8  PLT 183 161   Lipid Panel: Recent Labs    11/26/16 0531  CHOL 149  HDL 38*  LDLCALC 81  TRIG 148  CHOLHDL 3.9   TSH: No results for input(s): TSH in the last 8760 hours. A1C: No results found for: HGBA1C   Assessment/Plan 1. Sprain of left ankle, unspecified ligament, initial encounter Overall improving, may use ice if needed however appears his mobility is back to baseline. Slight tenderness with manipulation and Minimal edema still present but venous insufficiency could be contributing. Suspect this will improve over time.   2. 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   Next appt: as scheduled, sooner if needed for increase pain or swelling.  Carlos American. Harle Battiest  [facility] [phone] 8 am - 5 pm) [phone] (after hours)

## 2017-04-29 NOTE — Patient Instructions (Addendum)
May take more time for all the pain to go away but hopefully over time this will improve To elevate legs when sitting To use compression hose to LE Okay to use tylenol 325 mg 1-2 tablets every 6 hours as needed for pain.   Ankle Sprain An ankle sprain is a stretch or tear in one of the tough tissues (ligaments) in your ankle. Follow these instructions at home:  Rest your ankle.  Take over-the-counter and prescription medicines only as told by your doctor.  For 2-3 days, keep your ankle higher than the level of your heart (elevated) as much as possible.  If directed, put ice on the area: ? Put ice in a plastic bag. ? Place a towel between your skin and the bag. ? Leave the ice on for 20 minutes, 2-3 times a day.  If you were given a brace: ? Wear it as told. ? Take it off to shower or bathe. ? Try not to move your ankle much, but wiggle your toes from time to time. This helps to prevent swelling.  If you were given an elastic bandage (dressing): ? Take it off when you shower or bathe. ? Try not to move your ankle much, but wiggle your toes from time to time. This helps to prevent swelling. ? Adjust the bandage to make it more comfortable if it feels too tight. ? Loosen the bandage if you lose feeling in your foot, your foot tingles, or your foot gets cold and blue.  If you have crutches, use them as told by your doctor. Continue to use them until you can walk without feeling pain in your ankle. Contact a doctor if:  Your bruises or swelling are quickly getting worse.  Your pain does not get better after you take medicine. Get help right away if:  You cannot feel your toes or foot.  Your toes or your foot looks blue.  You have very bad pain that gets worse. This information is not intended to replace advice given to you by your health care provider. Make sure you discuss any questions you have with your health care provider. Document Released: 11/04/2007 Document Revised:  10/24/2015 Document Reviewed: 12/18/2014 Elsevier Interactive Patient Education  [employer].

## 2017-05-13 ENCOUNTER — Other Ambulatory Visit: Payer: Self-pay | Admitting: Internal Medicine

## 2017-05-13 DIAGNOSIS — G8929 Other chronic pain: Secondary | ICD-10-CM

## 2017-05-13 DIAGNOSIS — M545 Low back pain: Principal | ICD-10-CM

## 2017-06-13 ENCOUNTER — Other Ambulatory Visit: Payer: Self-pay | Admitting: Internal Medicine

## 2017-08-06 ENCOUNTER — Other Ambulatory Visit: Payer: Self-pay | Admitting: *Deleted

## 2017-08-06 DIAGNOSIS — M545 Low back pain, unspecified: Secondary | ICD-10-CM

## 2017-08-06 DIAGNOSIS — G8929 Other chronic pain: Secondary | ICD-10-CM

## 2017-08-06 MED ORDER — CELECOXIB 200 MG PO CAPS: 200.0000 mg | ORAL_CAPSULE | Freq: Every day | ORAL | 0 refills | Status: DC

## 2017-08-16 ENCOUNTER — Other Ambulatory Visit: Payer: Self-pay | Admitting: Internal Medicine

## 2017-08-16 DIAGNOSIS — K219 Gastro-esophageal reflux disease without esophagitis: Secondary | ICD-10-CM

## 2017-09-28 ENCOUNTER — Other Ambulatory Visit: Payer: Medicare Other

## 2017-09-28 DIAGNOSIS — I7102 Dissection of abdominal aorta: Secondary | ICD-10-CM

## 2017-09-28 DIAGNOSIS — I10 Essential (primary) hypertension: Secondary | ICD-10-CM | POA: Diagnosis not present

## 2017-09-28 DIAGNOSIS — G119 Hereditary ataxia, unspecified: Secondary | ICD-10-CM

## 2017-09-28 LAB — COMPLETE METABOLIC PANEL WITH GFR
AG Ratio: 1.8 (calc) (ref 1.0–2.5)
ALT: 16 U/L (ref 9–46)
AST: 15 U/L (ref 10–35)
Albumin: 4.2 g/dL (ref 3.6–5.1)
Alkaline phosphatase (APISO): 146 U/L — ABNORMAL HIGH (ref 40–115)
BUN: 17 mg/dL (ref 7–25)
CO2: 29 mmol/L (ref 20–32)
Calcium: 9.5 mg/dL (ref 8.6–10.3)
Chloride: 106 mmol/L (ref 98–110)
Creat: 1 mg/dL (ref 0.70–1.18)
GFR, Est African American: 84 mL/min/{1.73_m2} (ref >=60)
GFR, Est Non African American: 73 mL/min/{1.73_m2} (ref >=60)
Globulin: 2.3 g/dL (calc) (ref 1.9–3.7)
Glucose, Bld: 100 mg/dL — ABNORMAL HIGH (ref 65–99)
Potassium: 4.8 mmol/L (ref 3.5–5.3)
Sodium: 140 mmol/L (ref 135–146)
Total Bilirubin: 0.6 mg/dL (ref 0.2–1.2)
Total Protein: 6.5 g/dL (ref 6.1–8.1)

## 2017-09-28 LAB — LIPID PANEL
Cholesterol: 195 mg/dL (ref <200)
HDL: 52 mg/dL (ref >40)
LDL Cholesterol (Calc): 120 mg/dL (calc) — ABNORMAL HIGH
Non-HDL Cholesterol (Calc): 143 mg/dL (calc) — ABNORMAL HIGH (ref <130)
Total CHOL/HDL Ratio: 3.8 (calc) (ref <5.0)
Triglycerides: 118 mg/dL (ref <150)

## 2017-09-28 LAB — CBC WITH DIFFERENTIAL/PLATELET
Basophils Absolute: 49 cells/uL (ref 0–200)
Basophils Relative: 0.8 %
Eosinophils Absolute: 92 cells/uL (ref 15–500)
Eosinophils Relative: 1.5 %
HCT: 43.7 % (ref 38.5–50.0)
Hemoglobin: 15 g/dL (ref 13.2–17.1)
Lymphs Abs: 1885 cells/uL (ref 850–3900)
MCH: 30.1 pg (ref 27.0–33.0)
MCHC: 34.3 g/dL (ref 32.0–36.0)
MCV: 87.8 fL (ref 80.0–100.0)
MPV: 10.9 fL (ref 7.5–12.5)
Monocytes Relative: 8.8 %
Neutro Abs: 3538 cells/uL (ref 1500–7800)
Neutrophils Relative %: 58 %
Platelets: 187 10*3/uL (ref 140–400)
RBC: 4.98 10*6/uL (ref 4.20–5.80)
RDW: 12.6 % (ref 11.0–15.0)
Total Lymphocyte: 30.9 %
WBC mixed population: 537 cells/uL (ref 200–950)
WBC: 6.1 10*3/uL (ref 3.8–10.8)

## 2017-09-30 ENCOUNTER — Ambulatory Visit (INDEPENDENT_AMBULATORY_CARE_PROVIDER_SITE_OTHER): Payer: Medicare Other | Admitting: Internal Medicine

## 2017-09-30 ENCOUNTER — Encounter: Payer: Self-pay | Admitting: Internal Medicine

## 2017-09-30 VITALS — BP 120/70 | HR 63 | Temp 98.0°F | Ht 67.0 in | Wt 161.0 lb

## 2017-09-30 DIAGNOSIS — R51 Headache: Secondary | ICD-10-CM | POA: Diagnosis not present

## 2017-09-30 DIAGNOSIS — I10 Essential (primary) hypertension: Secondary | ICD-10-CM | POA: Diagnosis not present

## 2017-09-30 DIAGNOSIS — E7800 Pure hypercholesterolemia, unspecified: Secondary | ICD-10-CM | POA: Diagnosis not present

## 2017-09-30 DIAGNOSIS — G119 Hereditary ataxia, unspecified: Secondary | ICD-10-CM | POA: Diagnosis not present

## 2017-09-30 DIAGNOSIS — R519 Headache, unspecified: Secondary | ICD-10-CM

## 2017-09-30 DIAGNOSIS — M25472 Effusion, left ankle: Secondary | ICD-10-CM | POA: Diagnosis not present

## 2017-09-30 DIAGNOSIS — R1312 Dysphagia, oropharyngeal phase: Secondary | ICD-10-CM

## 2017-09-30 MED ORDER — HYDROCODONE-ACETAMINOPHEN 5-325 MG PO TABS: 1.0000 | ORAL_TABLET | Freq: Every day | ORAL | 0 refills | Status: DC | PRN

## 2017-09-30 NOTE — Progress Notes (Signed)
Location:  [facility] clinic Provider:  Kasee Hantz L. Mariea Clonts, D.O., C.M.D.  Code Status: DNR Goals of Care:  Advanced Directives 09/30/2017  Does Patient Have a Medical Advance Directive? Yes  Type of Advance Directive Out of facility DNR (pink MOST or yellow form)  Does patient want to make changes to medical advance directive? No - Patient declined  Copy of [place] in Chart? -  Would patient like information on creating a medical advance directive? -  Pre-existing out of facility DNR order (yellow form or pink MOST form) Yellow form placed in chart (order not valid for inpatient use)     Chief Complaint  Patient presents with  . Medical Management of Chronic Issues    80mth follow-up    HPI: Patient is a 77 y.o. male seen today for medical management of chronic diseases.    Had used hydrocodone in the past.  Previous PCP had prescribed, but pt had not been using regularly.  Last refill was 4/18 with 270 pills.    Reports he's about the same.  Getting headaches across his frontal area and in his neck.  Lately having them once a week.  He used to get migraines. He'd have to rest in a dark room.  No longer like that.  Has appt Monday for eye checkup--he thinks it's from his eyes b/c he sits in front of the computer a lot.  Uses the hydrocodone for headaches.  He notes muscle pains in his neck.    Left ankle gets tight.  Has turned his ankle a little when he pushes Barbara's wheelchair.  Speech and balance are no better.  He fell once--sat down onto the floor.  No injury.    Past Medical History:  Diagnosis Date  . Abnormality of gait September 07 2007  . Allergic rhinitis, cause unspecified 2007  . Arthritis   . Ataxia   . Cervicalgia February 26, 2006  . Chest pain   . Cramp of limb 10/21/11  . Degeneration of intervertebral disc, site unspecified 1998  . 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 Diaphragmatic hernia without mention of  obstruction or gangrene 1982   Hiatal hernia  . Diplopia 2006  . Disturbance of skin sensation 2003  . Diverticulosis of colon (without mention of hemorrhage) 1997  . Dizziness and giddiness 2001  . Dysphagia, unspecified(787.20) 1999 and  . Esophagitis, unspecified 1997  . GERD (gastroesophageal reflux disease) 2003  . Hypertension 2003  . Impotence of organic origin 1999  . Lack of coordination September 07, 2007  . Lumbago 2003  . Migraine with aura, without mention of intractable migraine without mention of status migrainosus 1997  . Myalgia and myositis, unspecified 1999  . Other and unspecified hyperlipidemia 2003  . Other disorders of vitreous 2003  . Other malaise and fatigue 2003  . Other seborrheic keratosis 2013  . Other seborrheic keratosis 04/13/09  . Pain in joint, site unspecified 04/13/12  . Personal history of fall 04/15/11  . Restless legs syndrome (RLS) 1997  . 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 Unspecified hearing loss 2003  . Unspecified tinnitus 04/15/11    Past Surgical History:  Procedure Laterality Date  . APPENDECTOMY  Age 35  . boil removed     Dr. Donne Hazel  . C3-4 anterior cervical surgery  1999   Dr. Hal Neer  . CARDIAC CATHETERIZATION  2001   Dr. Glade Lloyd  . COLONOSCOPY  07/20/07   Dr. Sharlett Iles  .  EYE SURGERY Bilateral 2015   cataracts  . LAMINOTOMY / EXCISION DISK POSTERIOR CERVICAL SPINE     Spinal Disk (?4-5)    Allergies  Allergen Reactions  . Aciphex [Rabeprazole Sodium]   . Effexor [Venlafaxine Hcl]   . Serzone [Nefazodone]   . Vivactil [Protriptyline Hcl]     Outpatient Encounter Medications as of 09/30/2017  Medication Sig  . amLODipine (NORVASC) 5 MG tablet Take 1 tablet (5 mg total) daily by mouth.  . celecoxib (CELEBREX) 200 MG capsule Take 1 capsule (200 mg total) by mouth daily.  . clotrimazole-betamethasone (LOTRISONE) cream Apply 1 application topically daily. To groin rash  . losartan (COZAAR) 100 MG  tablet TAKE 1 TABLET BY MOUTH EVERY DAY  . metoprolol succinate (TOPROL-XL) 25 MG 24 hr tablet Take 1 tablet (25 mg total) by mouth daily.  . mometasone (NASONEX) 50 MCG/ACT nasal spray Place 2 sprays into the nose daily.   Marland Kitchen oxybutynin (DITROPAN) 5 MG tablet TAKE 1 TABLET BY MOUTH UP TO 3 TIMES A DAY TO HELP BLADDER CONTROL  . pantoprazole (PROTONIX) 40 MG tablet TAKE 1 TABLET BY MOUTH EVERY DAY  . sildenafil (REVATIO) 20 MG tablet Take 2-5 tablets prior to intercourse   No facility-administered encounter medications on file as of 09/30/2017.     Review of Systems:  Review of Systems  Constitutional: Negative for chills, fever and malaise/fatigue.  Respiratory: Negative for cough and shortness of breath.   Cardiovascular: Positive for leg swelling. Negative for chest pain and palpitations.  Gastrointestinal: Negative for abdominal pain, blood in stool, constipation, diarrhea and melena.  Genitourinary: Negative for dysuria.  Musculoskeletal: Positive for falls. Negative for joint pain.  Skin: Negative for itching and rash.  Neurological: Positive for headaches. Negative for dizziness and loss of consciousness.       Balance poor, tremor of voice  Endo/Heme/Allergies: Does not bruise/bleed easily.  Psychiatric/Behavioral: Negative for depression and memory loss. The patient is not nervous/anxious and does not have insomnia.     Health Maintenance  Topic Date Due  . INFLUENZA VACCINE  12/30/2017  . TETANUS/TDAP  06/11/2021  . PNA vac Low Risk Adult  Completed    Physical Exam: Vitals:   09/30/17 1049  BP: 120/70  Pulse: 63  Temp: 98 F (36.7 C)  TempSrc: Oral  SpO2: 98%  Weight: 161 lb (73 kg)  Height: 5\' 7"  (1.702 m)   Body mass index is 25.22 kg/m. Physical Exam  Constitutional: He is oriented to person, place, and time. He appears well-developed and well-nourished. No distress.  HENT:  Head: Normocephalic and atraumatic.  Eyes:  glasses  Cardiovascular: Normal  rate, regular rhythm, normal heart sounds and intact distal pulses.  Pulmonary/Chest: Effort normal and breath sounds normal. No respiratory distress.  Musculoskeletal:  Walks with three-wheeled walker, turns left ankle outward; unsteady gait; speech slow with pauses and slurring  Neurological: He is alert and oriented to person, place, and time.  Skin: Skin is warm and dry.  Psychiatric: He has a normal mood and affect.    Labs reviewed: Basic Metabolic Panel: Recent Labs    11/25/16 0840 11/26/16 0531 09/28/17 0817  NA 138 139 140  K 3.9 3.8 4.8  CL 105 107 106  CO2 25 23 29   GLUCOSE 141* 93 100*  BUN 15 15 17   CREATININE 1.08 0.91 1.00  CALCIUM 9.2 9.1 9.5   Liver Function Tests: Recent Labs    11/25/16 1854 11/26/16 0531 09/28/17 0817  AST 19  16 15  ALT 17 15* 16  ALKPHOS 133* 120  --   BILITOT 0.7 1.2 0.6  PROT 6.2* 5.4* 6.5  ALBUMIN 3.8 3.3*  --    Recent Labs    11/25/16 1854  LIPASE 32   No results for input(s): AMMONIA in the last 8760 hours. CBC: Recent Labs    11/25/16 0840 11/26/16 0531 09/28/17 0817  WBC 6.1 7.6 6.1  NEUTROABS  --   --  3,538  HGB 15.8 14.7 15.0  HCT 45.1 43.4 43.7  MCV 87.7 88.8 87.8  PLT 183 161 187   Lipid Panel: Recent Labs    11/26/16 0531 09/28/17 0817  CHOL 149 195  HDL 38* 52  LDLCALC 81 120*  TRIG 148 118  CHOLHDL 3.9 3.8   Assessment/Plan 1. Frontal headache - about once a week, some muscular pain in neck with it -has used hydrocodone for headaches but does not take often--will decrease dispense number - HYDROcodone-acetaminophen (NORCO/VICODIN) 5-325 MG tablet; Take 1 tablet by mouth daily as needed for moderate pain (severe headache).  Dispense: 30 tablet; Refill: 0  2. Oropharyngeal dysphagia -is progressing, but pt not ready to get swallow study yet--given some advice in instructions about small sips after dry or hard foods and he's noticed if he swallows from further forward rather than backward,  things go down the "right pipe" better  3. 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  Dispense: 30 tablet; Refill: 0 - CBC with Differential/Platelet; Future - COMPLETE METABOLIC PANEL WITH GFR; Future  5. Essential hypertension - bp at goal now, cont regimen as ordered - CBC with Differential/Platelet; Future - COMPLETE METABOLIC PANEL WITH GFR; Future  6. Pure hypercholesterolemia - counseled on dietary changes to improve LDL and getting back on stationary bike - CBC with Differential/Platelet; Future - Lipid panel; Future  Labs/tests ordered:   Orders Placed This Encounter  Procedures  . CBC with Differential/Platelet    Standing Status:   Future    Standing Expiration Date:   10/01/2018  . COMPLETE METABOLIC PANEL WITH GFR    Standing Status:   Future    Standing Expiration Date:   10/01/2018  . Lipid panel    Standing Status:   Future    Standing Expiration Date:   10/01/2018    Next appt:  6 mos for CPE, labs before  Antonio Woodhams L. Seleena Reimers, D.O. [facility] [address], [place] [address] Cell Phone (Mon-Fri 8am-5pm):  [phone] On Call:  [phone] & follow prompts after 5pm & weekends Office Phone:  [phone] Office Fax:  [phone]

## 2017-09-30 NOTE — Patient Instructions (Addendum)
Cut down on fried foods, starchy foods and sweets, food cooked with butter to get your cholesterol lower.  Yours was 120 with goal of less than 100.  Also, get back to riding your indoor bike for exercise.  Let me know if you want to go through with a swallowing test due to your difficulty swallowing.  Drink sips of water or juice after eating hard or dry foods to help them go down.  Elevate your feet at rest to prevent swelling.

## 2017-10-03 ENCOUNTER — Other Ambulatory Visit: Payer: Self-pay | Admitting: Nurse Practitioner

## 2017-10-03 DIAGNOSIS — I10 Essential (primary) hypertension: Secondary | ICD-10-CM

## 2017-10-14 ENCOUNTER — Encounter: Payer: Self-pay | Admitting: Nurse Practitioner

## 2017-10-14 ENCOUNTER — Encounter: Payer: Self-pay | Admitting: Internal Medicine

## 2017-10-14 ENCOUNTER — Ambulatory Visit: Payer: Medicare Other | Admitting: Nurse Practitioner

## 2017-10-14 VITALS — BP 142/80 | HR 61 | Temp 98.1°F | Ht 67.0 in | Wt 160.4 lb

## 2017-10-14 DIAGNOSIS — M25472 Effusion, left ankle: Secondary | ICD-10-CM

## 2017-10-14 NOTE — Progress Notes (Signed)
Careteam: Patient Care Team: Gayland Curry, DO as PCP - General (Geriatric Medicine)  Advanced Directive information    Allergies  Allergen Reactions  . Aciphex [Rabeprazole Sodium]   . Effexor [Venlafaxine Hcl]   . Serzone [Nefazodone]   . Vivactil [Protriptyline Hcl]     Chief Complaint  Patient presents with  . Acute Visit    Left leg/ankle concern, refer to My Chart message.   . Medication Refill    None needed     HPI: Patient is a 77 y.o. male seen in the office today due to ankle concerns.  Pt was came originally at the beginning of the year for similar symptoms. Also saw Dr Mariea Clonts 2 weeks ago and mentioned it.  Medial ankle is swollen, heavy, tight, mild pain and noticed warmth and this comes and goes.  There is no worsening of symptoms today, actually symptoms improved at this time. Swelling worse in the evening.   Review of Systems:  Review of Systems  Constitutional: Negative for chills, fever and malaise/fatigue.  Respiratory: Negative for sputum production.   Cardiovascular: Positive for leg swelling (slight). Negative for chest pain.  Musculoskeletal: Negative for joint pain and myalgias.  Skin: Negative for itching and rash.    Past Medical History:  Diagnosis Date  . Abnormality of gait September 07 2007  . Allergic rhinitis, cause unspecified 2007  . Arthritis   . Ataxia   . Cervicalgia February 26, 2006  . Chest pain   . Cramp of limb 10/21/11  . Degeneration of intervertebral disc, site unspecified 1998  . 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 Diaphragmatic hernia without mention of obstruction or gangrene 1982   Hiatal hernia  . Diplopia 2006  . Disturbance of skin sensation 2003  . Diverticulosis of colon (without mention of hemorrhage) 1997  . Dizziness and giddiness 2001  . Dysphagia, unspecified(787.20) 1999 and  . Esophagitis, unspecified 1997  . GERD (gastroesophageal reflux disease) 2003  .  Hypertension 2003  . Impotence of organic origin 1999  . Lack of coordination September 07, 2007  . Lumbago 2003  . Migraine with aura, without mention of intractable migraine without mention of status migrainosus 1997  . Myalgia and myositis, unspecified 1999  . Other and unspecified hyperlipidemia 2003  . Other disorders of vitreous 2003  . Other malaise and fatigue 2003  . Other seborrheic keratosis 2013  . Other seborrheic keratosis 04/13/09  . Pain in joint, site unspecified 04/13/12  . Personal history of fall 04/15/11  . Restless legs syndrome (RLS) 1997  . 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 Unspecified hearing loss 2003  . Unspecified tinnitus 04/15/11   Past Surgical History:  Procedure Laterality Date  . APPENDECTOMY  Age 46  . boil removed     Dr. Donne Hazel  . C3-4 anterior cervical surgery  1999   Dr. Hal Neer  . CARDIAC CATHETERIZATION  2001   Dr. Glade Lloyd  . COLONOSCOPY  07/20/07   Dr. Sharlett Iles  . EYE SURGERY Bilateral 2015   cataracts  . LAMINOTOMY / EXCISION DISK POSTERIOR CERVICAL SPINE     Spinal Disk (?4-5)   Social History:   reports that he has never smoked. He has quit using smokeless tobacco. His smokeless tobacco use included chew. He reports that he drinks alcohol. He reports that he does not use drugs.  Family History  Problem Relation Age of Onset  . Heart disease Father   . Diabetes  Neg Hx   . Colon cancer Neg Hx   . Colon polyps Neg Hx   . Esophageal cancer Neg Hx   . Kidney disease Neg Hx   . Gallbladder disease Neg Hx     Medications: Patient's Medications  New Prescriptions   No medications on file  Previous Medications   AMLODIPINE (NORVASC) 5 MG TABLET    TAKE 1 TABLET BY MOUTH EVERY DAY   CELECOXIB (CELEBREX) 200 MG CAPSULE    Take 1 capsule (200 mg total) by mouth daily.   CLOTRIMAZOLE-BETAMETHASONE (LOTRISONE) CREAM    Apply 1 application topically daily. To groin rash   HYDROCODONE-ACETAMINOPHEN  (NORCO/VICODIN) 5-325 MG TABLET    Take 1 tablet by mouth daily as needed for moderate pain (severe headache).   LOSARTAN (COZAAR) 100 MG TABLET    TAKE 1 TABLET BY MOUTH EVERY DAY   METOPROLOL SUCCINATE (TOPROL-XL) 25 MG 24 HR TABLET    Take 1 tablet (25 mg total) by mouth daily.   MOMETASONE (NASONEX) 50 MCG/ACT NASAL SPRAY    Place 2 sprays into the nose daily.    OXYBUTYNIN (DITROPAN) 5 MG TABLET    TAKE 1 TABLET BY MOUTH UP TO 3 TIMES A DAY TO HELP BLADDER CONTROL   PANTOPRAZOLE (PROTONIX) 40 MG TABLET    TAKE 1 TABLET BY MOUTH EVERY DAY   SILDENAFIL (REVATIO) 20 MG TABLET    Take 2-5 tablets prior to intercourse  Modified Medications   No medications on file  Discontinued Medications   No medications on file     Physical Exam:  Vitals:   10/14/17 1136  BP: (!) 142/80  Pulse: 61  Temp: 98.1 F (36.7 C)  SpO2: 94%  Weight: 160 lb 6.4 oz (72.8 kg)  Height: 5\' 7"  (1.702 m)   Body mass index is 25.12 kg/m.  Physical Exam  Constitutional: He is oriented to person, place, and time. He appears well-developed and well-nourished.  Cardiovascular: Normal rate, regular rhythm and normal heart sounds.  Pulmonary/Chest: Effort normal and breath sounds normal.  Musculoskeletal: Normal range of motion. He exhibits edema (trace). He exhibits no tenderness.  Neurological: He is alert and oriented to person, place, and time.    Labs reviewed: Basic Metabolic Panel: Recent Labs    11/25/16 0840 11/26/16 0531 09/28/17 0817  NA 138 139 140  K 3.9 3.8 4.8  CL 105 107 106  CO2 25 23 29   GLUCOSE 141* 93 100*  BUN 15 15 17   CREATININE 1.08 0.91 1.00  CALCIUM 9.2 9.1 9.5   Liver Function Tests: Recent Labs    11/25/16 1854 11/26/16 0531 09/28/17 0817  AST 19 16 15   ALT 17 15* 16  ALKPHOS 133* 120  --   BILITOT 0.7 1.2 0.6  PROT 6.2* 5.4* 6.5  ALBUMIN 3.8 3.3*  --    Recent Labs    11/25/16 1854  LIPASE 32   No results for input(s): AMMONIA in the last 8760  hours. CBC: Recent Labs    11/25/16 0840 11/26/16 0531 09/28/17 0817  WBC 6.1 7.6 6.1  NEUTROABS  --   --  3,538  HGB 15.8 14.7 15.0  HCT 45.1 43.4 43.7  MCV 87.7 88.8 87.8  PLT 183 161 187   Lipid Panel: Recent Labs    11/26/16 0531 09/28/17 0817  CHOL 149 195  HDL 38* 52  LDLCALC 81 120*  TRIG 148 118  CHOLHDL 3.9 3.8   TSH: No results for input(s): TSH in  the last 8760 hours. A1C: No results found for: HGBA1C   Assessment/Plan 1. Left ankle swelling -reassurance given, no abnormality noted on exam. trace edema noted on exam with small varicosities. Worse at the end of the day. Swelling most likely due to venous insuffiencey. Encouraged to elevate legs above the level of the heart. Compression hose/socks during the day and limiting sodium intake.   Next appt: 04/04/2018 Carlos American. Emma, [facility] [phone]

## 2017-10-14 NOTE — Telephone Encounter (Signed)
Called and notified patient. Patient scheduled today with Janett Billow.

## 2017-10-14 NOTE — Patient Instructions (Signed)
To wear compression socks or hose daily- on in am and off at bedtime Elevate legs above the level of the heart as you can throughout the day.  Low sodium diet.

## 2017-11-03 ENCOUNTER — Other Ambulatory Visit: Payer: Self-pay | Admitting: Internal Medicine

## 2017-11-03 DIAGNOSIS — M545 Low back pain, unspecified: Secondary | ICD-10-CM

## 2017-11-03 DIAGNOSIS — G8929 Other chronic pain: Secondary | ICD-10-CM

## 2017-11-08 DIAGNOSIS — H43392 Other vitreous opacities, left eye: Secondary | ICD-10-CM | POA: Diagnosis not present

## 2017-11-08 DIAGNOSIS — H43813 Vitreous degeneration, bilateral: Secondary | ICD-10-CM | POA: Diagnosis not present

## 2017-11-08 DIAGNOSIS — H16221 Keratoconjunctivitis sicca, not specified as Sjogren's, right eye: Secondary | ICD-10-CM | POA: Diagnosis not present

## 2017-11-08 DIAGNOSIS — Z961 Presence of intraocular lens: Secondary | ICD-10-CM | POA: Diagnosis not present

## 2017-11-09 ENCOUNTER — Other Ambulatory Visit: Payer: Self-pay | Admitting: Internal Medicine

## 2017-11-09 DIAGNOSIS — G8929 Other chronic pain: Secondary | ICD-10-CM

## 2017-11-09 DIAGNOSIS — M545 Low back pain: Principal | ICD-10-CM

## 2017-11-16 ENCOUNTER — Encounter: Payer: Self-pay | Admitting: Internal Medicine

## 2017-11-16 DIAGNOSIS — G8929 Other chronic pain: Secondary | ICD-10-CM

## 2017-11-16 DIAGNOSIS — M545 Low back pain, unspecified: Secondary | ICD-10-CM

## 2017-11-16 MED ORDER — CELECOXIB 200 MG PO CAPS: ORAL_CAPSULE | ORAL | 1 refills | Status: DC

## 2018-01-19 ENCOUNTER — Other Ambulatory Visit: Payer: Self-pay | Admitting: *Deleted

## 2018-01-19 DIAGNOSIS — N3941 Urge incontinence: Secondary | ICD-10-CM

## 2018-01-19 MED ORDER — OXYBUTYNIN CHLORIDE 5 MG PO TABS: ORAL_TABLET | ORAL | 3 refills | Status: DC

## 2018-01-19 NOTE — Telephone Encounter (Signed)
[facility] [employer] 

## 2018-01-27 ENCOUNTER — Telehealth: Payer: Self-pay | Admitting: Internal Medicine

## 2018-01-27 NOTE — Telephone Encounter (Signed)
I called the pt to schedule AWV-I w/ Clarise Cruz, possibly on the same day as his 04/04/18 labs.  I left a message asking him to call me at [phone]. VDM (DD)

## 2018-02-17 DIAGNOSIS — Z23 Encounter for immunization: Secondary | ICD-10-CM | POA: Diagnosis not present

## 2018-03-13 ENCOUNTER — Other Ambulatory Visit: Payer: Self-pay | Admitting: Nurse Practitioner

## 2018-03-13 DIAGNOSIS — I10 Essential (primary) hypertension: Secondary | ICD-10-CM

## 2018-03-14 ENCOUNTER — Other Ambulatory Visit: Payer: Self-pay | Admitting: Nurse Practitioner

## 2018-03-14 DIAGNOSIS — I10 Essential (primary) hypertension: Secondary | ICD-10-CM

## 2018-03-17 ENCOUNTER — Other Ambulatory Visit: Payer: Self-pay | Admitting: Internal Medicine

## 2018-03-25 IMAGING — CT CT CTA ABD/PEL W/CM AND/OR W/O CM
2 of 9 series · 14 of 46 positions shown, 16 images · IV contrast (APPLIED)
Comparison: None.

CLINICAL DATA: Chest pain and shortness of breath

EXAM:
CT ANGIOGRAPHY CHEST, ABDOMEN AND PELVIS
TECHNIQUE: Multidetector CT imaging through the chest, abdomen and pelvis was
performed using the standard protocol during bolus administration of
intravenous contrast. Multiplanar reconstructed images and MIPs were
obtained and reviewed to evaluate the vascular anatomy.
CONTRAST:  100 mL Isovue 370.

[Series 5: axial arterial · axial · arterial · 0.84mm/px · z∈[-578,-26]mm · 11 of 212 slices shown, 13 images]
[im 14/212  soft-tissue]
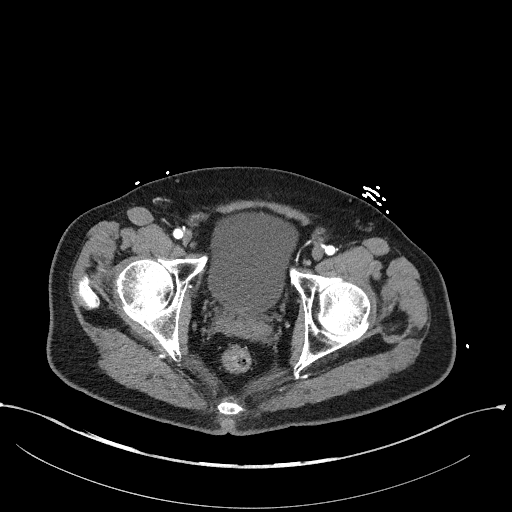
[im 14/212  bone]
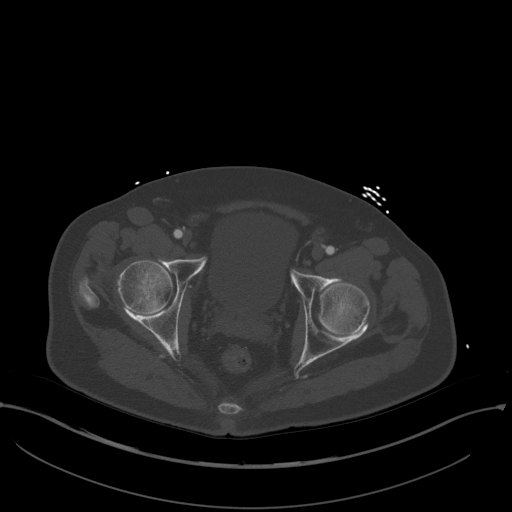
[im 40/212  soft-tissue]
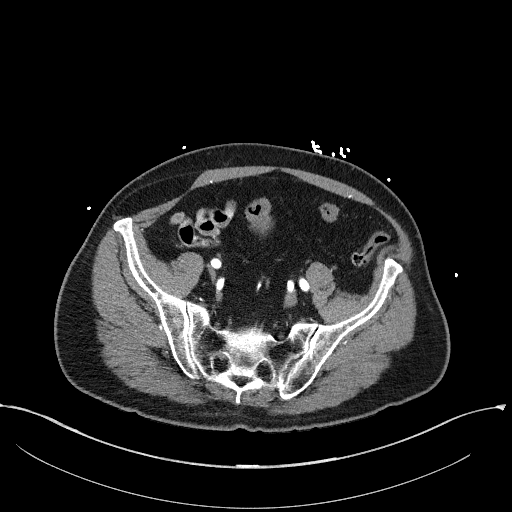
[im 53/212  soft-tissue]
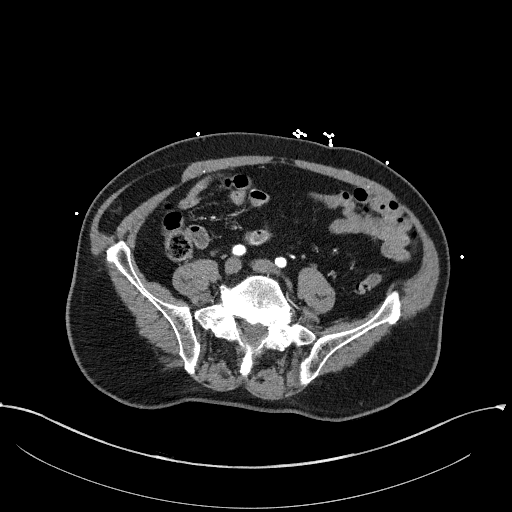
[im 66/212  soft-tissue]
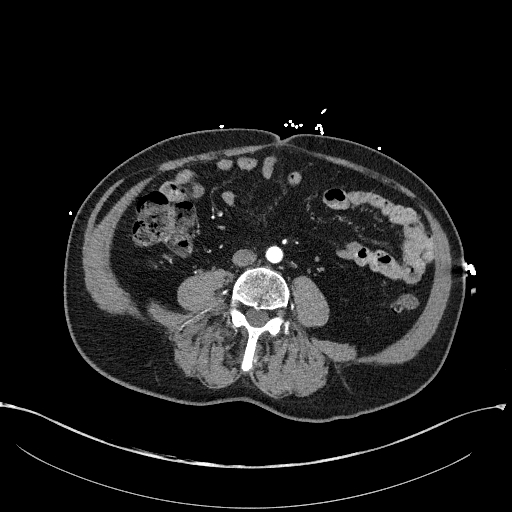
[im 93/212  soft-tissue]
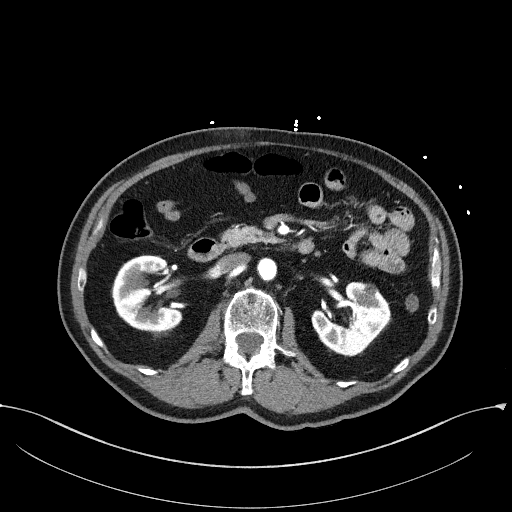
[im 106/212  soft-tissue]
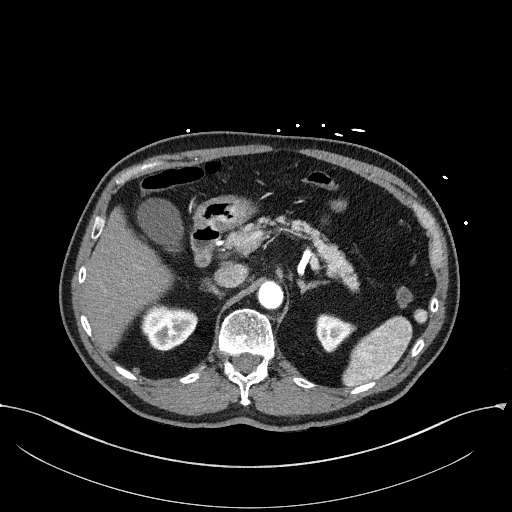
[im 119/212  soft-tissue]
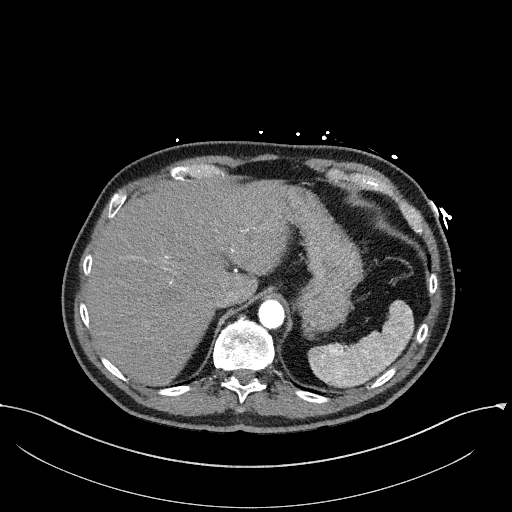
[im 146/212  soft-tissue]
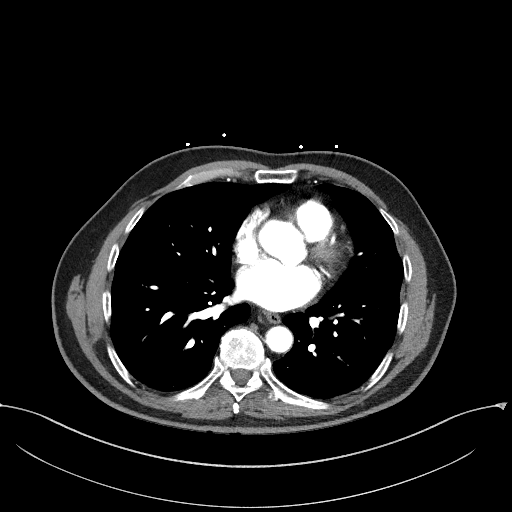
[im 159/212  soft-tissue]
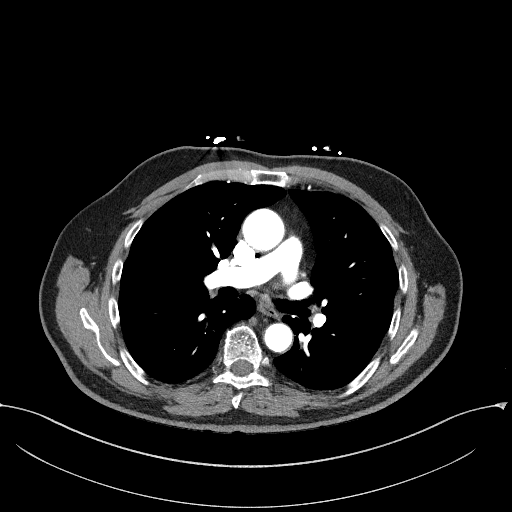
[im 159/212  bone]
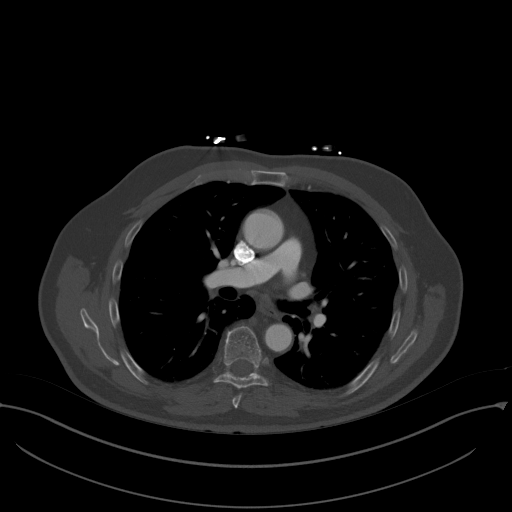
[im 172/212  soft-tissue]
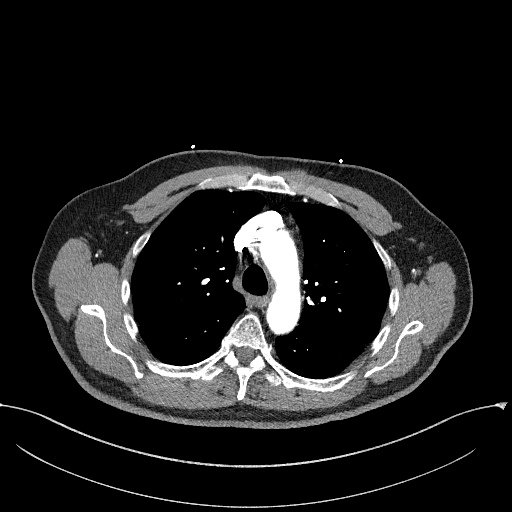
[im 198/212  soft-tissue]
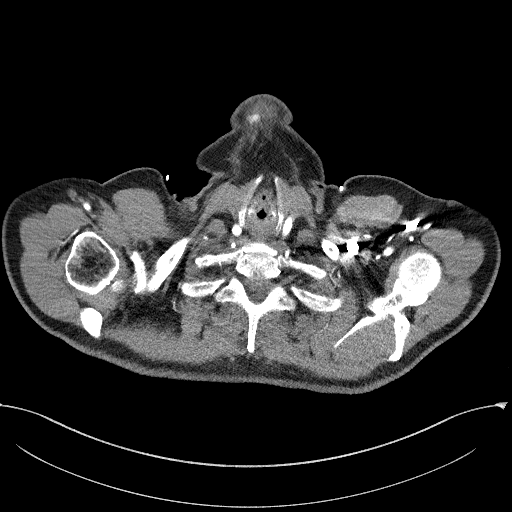

[Series 7: coronals · coronal · 0.85mm/px · 3 of 142 slices shown]
[im 36/142  soft-tissue]
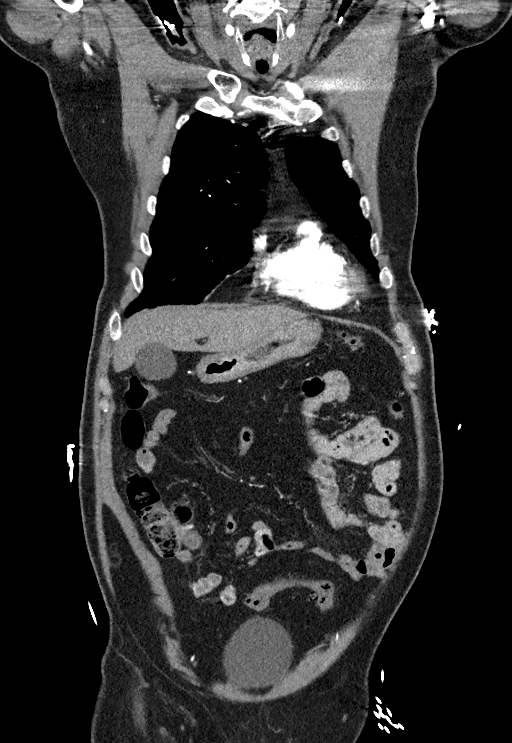
[im 71/142  soft-tissue]
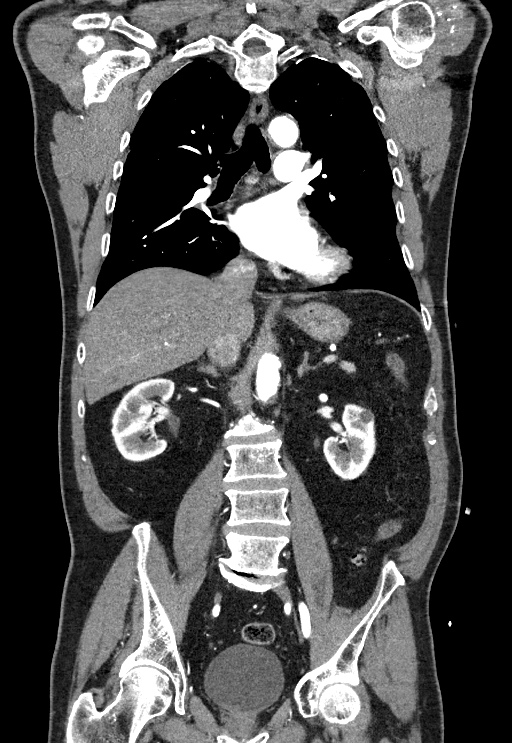
[im 106/142  soft-tissue]
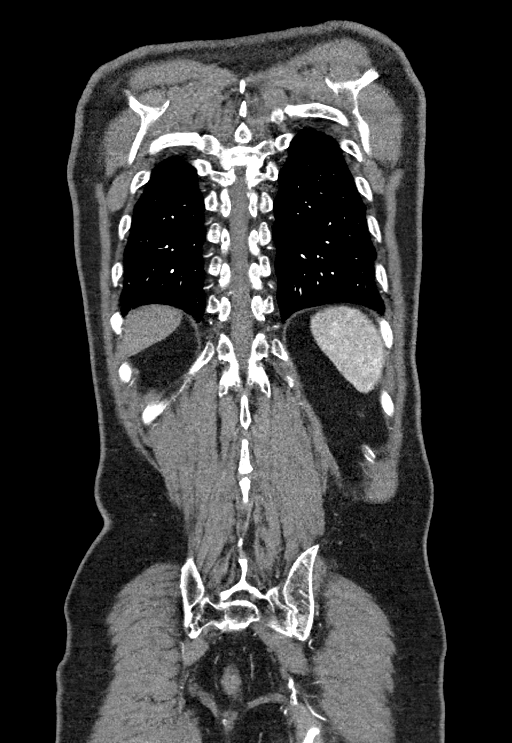

[14 of 46 positions shown; findings below may reference images not displayed]

FINDINGS: CTA CHEST FINDINGS

Cardiovascular: Thoracic aorta shows mild atherosclerotic changes
without aneurysmal dilatation or dissection. The pulmonary artery
shows no evidence of pulmonary emboli. No significant cardiac
enlargement is seen. No coronary calcifications are noted.

Mediastinum/Nodes: The thoracic inlet is within normal limits. No
hilar or mediastinal adenopathy is noted. The esophagus is within
normal limits.

Lungs/Pleura: Lungs are clear. No pleural effusion or pneumothorax.

Musculoskeletal: No chest wall abnormality. No acute or significant
osseous findings.

Review of the MIP images confirms the above findings.

CTA ABDOMEN AND PELVIS FINDINGS

VASCULAR

Aorta: Mild atherosclerotic changes are noted without aneurysmal
dilatation. Intimal defect is noted in the left lateral wall just
below the renal artery is consistent with small dissection. This is
not likely related to the patient's current symptomatology of chest
pain.

Celiac: Patent without evidence of aneurysm, dissection, vasculitis
or significant stenosis.

SMA: Patent without evidence of aneurysm, dissection, vasculitis or
significant stenosis.

Renals: Will renal arteries are identified bilaterally. The
accessory renal arteries bilaterally are diminutive and some as.

IMA: Patent without evidence of aneurysm, dissection, vasculitis or
significant stenosis.

Iliacs: No significant atherosclerotic changes noted.

Veins: Within normal limits.

Review of the MIP images confirms the above findings.

NON-VASCULAR

Hepatobiliary: Diffuse fatty infiltration of the liver is noted. The
gallbladder is within normal limits.

Pancreas: Unremarkable. No pancreatic ductal dilatation or
surrounding inflammatory changes.

Spleen: Normal in size without focal abnormality.

Adrenals/Urinary Tract: The adrenal glands are within normal limits.
The kidneys demonstrate no renal calculi or obstructive changes. A
few scattered cysts are noted. The bladder is well distended.

Stomach/Bowel: Scattered diverticular change of the colon is noted
without evidence of diverticulitis. The appendix has been surgically
removed. No obstructive or inflammatory changes are noted.

Lymphatic: No significant lymphadenopathy is noted.

Reproductive: Prostate is unremarkable.

Other: No abdominal wall hernia or abnormality. No abdominopelvic
ascites.

Musculoskeletal: Degenerative changes of lumbar spine are noted. No
acute abnormality is seen.

Review of the MIP images confirms the above findings.
IMPRESSION: No evidence of pulmonary emboli.

No evidence of thoracic aortic dissection.

Small focal intimal defect in the infrarenal abdominal aorta
consistent with focal dissection. This is of uncertain chronicity
but of likely to correspond with the patient's current
symptomatology of chest pain.

Chronic changes as described above.

## 2018-03-29 ENCOUNTER — Other Ambulatory Visit: Payer: Self-pay | Admitting: Nurse Practitioner

## 2018-03-29 DIAGNOSIS — I10 Essential (primary) hypertension: Secondary | ICD-10-CM

## 2018-04-04 ENCOUNTER — Other Ambulatory Visit: Payer: Medicare Other

## 2018-04-04 ENCOUNTER — Ambulatory Visit (INDEPENDENT_AMBULATORY_CARE_PROVIDER_SITE_OTHER): Payer: Medicare Other

## 2018-04-04 VITALS — BP 122/68 | HR 58 | Temp 98.2°F | Resp 97 | Ht 67.0 in | Wt 159.0 lb

## 2018-04-04 DIAGNOSIS — G119 Hereditary ataxia, unspecified: Secondary | ICD-10-CM

## 2018-04-04 DIAGNOSIS — I10 Essential (primary) hypertension: Secondary | ICD-10-CM | POA: Diagnosis not present

## 2018-04-04 DIAGNOSIS — Z0000 Encounter for general adult medical examination without abnormal findings: Secondary | ICD-10-CM

## 2018-04-04 DIAGNOSIS — E7800 Pure hypercholesterolemia, unspecified: Secondary | ICD-10-CM | POA: Diagnosis not present

## 2018-04-04 DIAGNOSIS — M25472 Effusion, left ankle: Secondary | ICD-10-CM | POA: Diagnosis not present

## 2018-04-04 LAB — COMPLETE METABOLIC PANEL WITH GFR
AG Ratio: 1.9 (calc) (ref 1.0–2.5)
ALT: 14 U/L (ref 9–46)
AST: 15 U/L (ref 10–35)
Albumin: 4.4 g/dL (ref 3.6–5.1)
Alkaline phosphatase (APISO): 144 U/L — ABNORMAL HIGH (ref 40–115)
BUN: 15 mg/dL (ref 7–25)
CO2: 29 mmol/L (ref 20–32)
Calcium: 9.7 mg/dL (ref 8.6–10.3)
Chloride: 104 mmol/L (ref 98–110)
Creat: 0.97 mg/dL (ref 0.70–1.18)
GFR, Est African American: 88 mL/min/{1.73_m2} (ref >=60)
GFR, Est Non African American: 76 mL/min/{1.73_m2} (ref >=60)
Globulin: 2.3 g/dL (calc) (ref 1.9–3.7)
Glucose, Bld: 94 mg/dL (ref 65–99)
Potassium: 4.7 mmol/L (ref 3.5–5.3)
Sodium: 140 mmol/L (ref 135–146)
Total Bilirubin: 0.8 mg/dL (ref 0.2–1.2)
Total Protein: 6.7 g/dL (ref 6.1–8.1)

## 2018-04-04 LAB — CBC WITH DIFFERENTIAL/PLATELET
Basophils Absolute: 50 cells/uL (ref 0–200)
Basophils Relative: 0.8 %
Eosinophils Absolute: 88 cells/uL (ref 15–500)
Eosinophils Relative: 1.4 %
HCT: 46.4 % (ref 38.5–50.0)
Hemoglobin: 15.9 g/dL (ref 13.2–17.1)
Lymphs Abs: 1959 cells/uL (ref 850–3900)
MCH: 30.4 pg (ref 27.0–33.0)
MCHC: 34.3 g/dL (ref 32.0–36.0)
MCV: 88.7 fL (ref 80.0–100.0)
MPV: 11 fL (ref 7.5–12.5)
Monocytes Relative: 7.9 %
Neutro Abs: 3704 cells/uL (ref 1500–7800)
Neutrophils Relative %: 58.8 %
Platelets: 210 10*3/uL (ref 140–400)
RBC: 5.23 10*6/uL (ref 4.20–5.80)
RDW: 12.5 % (ref 11.0–15.0)
Total Lymphocyte: 31.1 %
WBC mixed population: 498 cells/uL (ref 200–950)
WBC: 6.3 10*3/uL (ref 3.8–10.8)

## 2018-04-04 LAB — LIPID PANEL
Cholesterol: 209 mg/dL — ABNORMAL HIGH (ref <200)
HDL: 53 mg/dL (ref >40)
LDL Cholesterol (Calc): 128 mg/dL (calc) — ABNORMAL HIGH
Non-HDL Cholesterol (Calc): 156 mg/dL (calc) — ABNORMAL HIGH (ref <130)
Total CHOL/HDL Ratio: 3.9 (calc) (ref <5.0)
Triglycerides: 159 mg/dL — ABNORMAL HIGH (ref <150)

## 2018-04-04 MED ORDER — METOPROLOL SUCCINATE ER 25 MG PO TB24: 25.0000 mg | ORAL_TABLET | Freq: Every day | ORAL | 1 refills | Status: DC

## 2018-04-04 MED ORDER — ZOSTER VAC RECOMB ADJUVANTED 50 MCG/0.5ML IM SUSR: 0.5000 mL | Freq: Once | INTRAMUSCULAR | 1 refills | Status: AC

## 2018-04-04 NOTE — Patient Instructions (Signed)
[name] , Thank you for taking time to come for your Medicare Wellness Visit. I appreciate your ongoing commitment to your health goals. Please review the following plan we discussed and let me know if I can assist you in the future.   Screening recommendations/referrals: Colonoscopy excluded, over age 77 Recommended yearly ophthalmology/optometry visit for glaucoma screening and checkup Recommended yearly dental visit for hygiene and checkup  Vaccinations: Influenza vaccine up to date, due fall 2020 Pneumococcal vaccine up to date, completed Tdap vaccine up to date, due 06/11/2021 Shingles vaccine due, ordered to pharmacy    Advanced directives: Advance directive discussed with you today. I have provided a copy for you to complete at home and have notarized. Once this is complete please bring a copy in to our office so we can scan it into your chart.  Conditions/risks identified: fall risk  Next appointment: Dr. Mariea Clonts 04/07/2018 @ 1:30pm            Justin Dense, RN 04/05/2019 @ 9:15am  Preventive Care 65 Years and Older, Male Preventive care refers to lifestyle choices and visits with your health care provider that can promote health and wellness. What does preventive care include?  A yearly physical exam. This is also called an annual well check.  Dental exams once or twice a year.  Routine eye exams. Ask your health care provider how often you should have your eyes checked.  Personal lifestyle choices, including:  Daily care of your teeth and gums.  Regular physical activity.  Eating a healthy diet.  Avoiding tobacco and drug use.  Limiting alcohol use.  Practicing safe sex.  Taking low doses of aspirin every day.  Taking vitamin and mineral supplements as recommended by your health care provider. What happens during an annual well check? The services and screenings done by your health care provider during your annual well check will depend on your age, overall  health, lifestyle risk factors, and family history of disease. Counseling  Your health care provider may ask you questions about your:  Alcohol use.  Tobacco use.  Drug use.  Emotional well-being.  Home and relationship well-being.  Sexual activity.  Eating habits.  History of falls.  Memory and ability to understand (cognition).  Work and work Statistician. Screening  You may have the following tests or measurements:  Height, weight, and BMI.  Blood pressure.  Lipid and cholesterol levels. These may be checked every 5 years, or more frequently if you are over 57 years old.  Skin check.  Lung cancer screening. You may have this screening every year starting at age 43 if you have a 30-pack-year history of smoking and currently smoke or have quit within the past 15 years.  Fecal occult blood test (FOBT) of the stool. You may have this test every year starting at age 43.  Flexible sigmoidoscopy or colonoscopy. You may have a sigmoidoscopy every 5 years or a colonoscopy every 10 years starting at age 48.  Prostate cancer screening. Recommendations will vary depending on your family history and other risks.  Hepatitis C blood test.  Hepatitis B blood test.  Sexually transmitted disease (STD) testing.  Diabetes screening. This is done by checking your blood sugar (glucose) after you have not eaten for a while (fasting). You may have this done every 1-3 years.  Abdominal aortic aneurysm (AAA) screening. You may need this if you are a current or former smoker.  Osteoporosis. You may be screened starting at age 41 if you are  at high risk. Talk with your health care provider about your test results, treatment options, and if necessary, the need for more tests. Vaccines  Your health care provider may recommend certain vaccines, such as:  Influenza vaccine. This is recommended every year.  Tetanus, diphtheria, and acellular pertussis (Tdap, Td) vaccine. You may need a Td  booster every 10 years.  Zoster vaccine. You may need this after age 26.  Pneumococcal 13-valent conjugate (PCV13) vaccine. One dose is recommended after age 89.  Pneumococcal polysaccharide (PPSV23) vaccine. One dose is recommended after age 63. Talk to your health care provider about which screenings and vaccines you need and how often you need them. This information is not intended to replace advice given to you by your health care provider. Make sure you discuss any questions you have with your health care provider. Document Released: 06/14/2015 Document Revised: 02/05/2016 Document Reviewed: 03/19/2015 Elsevier Interactive Patient Education  2017 [employer] Prevention in the Home Falls can cause injuries. They can happen to people of all ages. There are many things you can do to make your home safe and to help prevent falls. What can I do on the outside of my home?  Regularly fix the edges of walkways and driveways and fix any cracks.  Remove anything that might make you trip as you walk through a door, such as a raised step or threshold.  Trim any bushes or trees on the path to your home.  Use bright outdoor lighting.  Clear any walking paths of anything that might make someone trip, such as rocks or tools.  Regularly check to see if handrails are loose or broken. Make sure that both sides of any steps have handrails.  Any raised decks and porches should have guardrails on the edges.  Have any leaves, snow, or ice cleared regularly.  Use sand or salt on walking paths during winter.  Clean up any spills in your garage right away. This includes oil or grease spills. What can I do in the bathroom?  Use night lights.  Install grab bars by the toilet and in the tub and shower. Do not use towel bars as grab bars.  Use non-skid mats or decals in the tub or shower.  If you need to sit down in the shower, use a plastic, non-slip stool.  Keep the floor dry. Clean up  any water that spills on the floor as soon as it happens.  Remove soap buildup in the tub or shower regularly.  Attach bath mats securely with double-sided non-slip rug tape.  Do not have throw rugs and other things on the floor that can make you trip. What can I do in the bedroom?  Use night lights.  Make sure that you have a light by your bed that is easy to reach.  Do not use any sheets or blankets that are too big for your bed. They should not hang down onto the floor.  Have a firm chair that has side arms. You can use this for support while you get dressed.  Do not have throw rugs and other things on the floor that can make you trip. What can I do in the kitchen?  Clean up any spills right away.  Avoid walking on wet floors.  Keep items that you use a lot in easy-to-reach places.  If you need to reach something above you, use a strong step stool that has a grab bar.  Keep electrical cords out of  the way.  Do not use floor polish or wax that makes floors slippery. If you must use wax, use non-skid floor wax.  Do not have throw rugs and other things on the floor that can make you trip. What can I do with my stairs?  Do not leave any items on the stairs.  Make sure that there are handrails on both sides of the stairs and use them. Fix handrails that are broken or loose. Make sure that handrails are as long as the stairways.  Check any carpeting to make sure that it is firmly attached to the stairs. Fix any carpet that is loose or worn.  Avoid having throw rugs at the top or bottom of the stairs. If you do have throw rugs, attach them to the floor with carpet tape.  Make sure that you have a light switch at the top of the stairs and the bottom of the stairs. If you do not have them, ask someone to add them for you. What else can I do to help prevent falls?  Wear shoes that:  Do not have high heels.  Have rubber bottoms.  Are comfortable and fit you well.  Are  closed at the toe. Do not wear sandals.  If you use a stepladder:  Make sure that it is fully opened. Do not climb a closed stepladder.  Make sure that both sides of the stepladder are locked into place.  Ask someone to hold it for you, if possible.  Clearly mark and make sure that you can see:  Any grab bars or handrails.  First and last steps.  Where the edge of each step is.  Use tools that help you move around (mobility aids) if they are needed. These include:  Canes.  Walkers.  Scooters.  Crutches.  Turn on the lights when you go into a dark area. Replace any light bulbs as soon as they burn out.  Set up your furniture so you have a clear path. Avoid moving your furniture around.  If any of your floors are uneven, fix them.  If there are any pets around you, be aware of where they are.  Review your medicines with your doctor. Some medicines can make you feel dizzy. This can increase your chance of falling. Ask your doctor what other things that you can do to help prevent falls. This information is not intended to replace advice given to you by your health care provider. Make sure you discuss any questions you have with your health care provider. Document Released: 03/14/2009 Document Revised: 10/24/2015 Document Reviewed: 06/22/2014 Elsevier Interactive Patient Education  2017 [employer].

## 2018-04-04 NOTE — Progress Notes (Signed)
Subjective:   Justin [name] is a 77 y.o. male who presents for an Initial Medicare Annual Wellness Visit.    Objective:    Today's Vitals   04/04/18 0920  BP: 122/68  Pulse: (!) 58  Resp: (!) 97  Temp: 98.2 F (36.8 C)  TempSrc: Oral  Weight: 159 lb (72.1 kg)  Height: 5\' 7"  (1.702 m)  PainSc: 7    Body mass index is 24.9 kg/m.  Advanced Directives 04/04/2018 09/30/2017 04/29/2017 01/07/2017 01/06/2017 12/30/2016 12/03/2016  Does Patient Have a Medical Advance Directive? Yes Yes Yes No Yes No -  Type of Paramedic of [place];Out of facility DNR (pink MOST or yellow form) Out of facility DNR (pink MOST or yellow form) Out of facility DNR (pink MOST or yellow form) - Press photographer;Living will - [place];Living will  Does patient want to make changes to medical advance directive? No - Patient declined No - Patient declined - - - - -  Copy of [place] in Chart? No - copy requested - - - - - No - copy requested  Would patient like information on creating a medical advance directive? - - - - - - -  Pre-existing out of facility DNR order (yellow form or pink MOST form) Yellow form placed in chart (order not valid for inpatient use) Yellow form placed in chart (order not valid for inpatient use) Yellow form placed in chart (order not valid for inpatient use) - - - -    Current Medications (verified) Outpatient Encounter Medications as of 04/04/2018  Medication Sig  . amLODipine (NORVASC) 5 MG tablet TAKE 1 TABLET BY MOUTH EVERY DAY  . celecoxib (CELEBREX) 200 MG capsule TAKE 1 CAPSULE BY MOUTH EVERY DAY  . clotrimazole-betamethasone (LOTRISONE) cream Apply 1 application topically daily. To groin rash  . HYDROcodone-acetaminophen (NORCO/VICODIN) 5-325 MG tablet Take 1 tablet by mouth daily as needed for moderate pain (severe headache).  . losartan (COZAAR) 100 MG tablet TAKE 1 TABLET BY MOUTH EVERY DAY  . metoprolol  succinate (TOPROL-XL) 25 MG 24 hr tablet TAKE 1 TABLET BY MOUTH EVERY DAY  . mometasone (NASONEX) 50 MCG/ACT nasal spray Place 2 sprays into the nose daily.   Marland Kitchen oxybutynin (DITROPAN) 5 MG tablet Take one tablet by mouth up to three times daily to help bladder control.  . pantoprazole (PROTONIX) 40 MG tablet TAKE 1 TABLET BY MOUTH EVERY DAY  . sildenafil (REVATIO) 20 MG tablet Take 2-5 tablets prior to intercourse  . Zoster Vaccine Adjuvanted [facility]) injection Inject 0.5 mLs into the muscle once for 1 dose.  . [DISCONTINUED] Zoster Vaccine Adjuvanted [facility]) injection Inject 0.5 mLs into the muscle once.   No facility-administered encounter medications on file as of 04/04/2018.     Allergies (verified) Aciphex [rabeprazole sodium]; Effexor [venlafaxine hcl]; Serzone [nefazodone]; and Vivactil [protriptyline hcl]   History: Past Medical History:  Diagnosis Date  . Abnormality of gait September 07 2007  . Allergic rhinitis, cause unspecified 2007  . Arthritis   . Ataxia   . Cervicalgia February 26, 2006  . Chest pain   . Cramp of limb 10/21/11  . Degeneration of intervertebral disc, site unspecified 1998  . 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 Diaphragmatic hernia without mention of obstruction or gangrene 1982   Hiatal hernia  . Diplopia 2006  . Disturbance of skin sensation 2003  . Diverticulosis of colon (without mention of hemorrhage)  1997  . Dizziness and giddiness 2001  . Dysphagia, unspecified(787.20) 1999 and  . Esophagitis, unspecified 1997  . GERD (gastroesophageal reflux disease) 2003  . Hypertension 2003  . Impotence of organic origin 1999  . Lack of coordination September 07, 2007  . Lumbago 2003  . Migraine with aura, without mention of intractable migraine without mention of status migrainosus 1997  . Myalgia and myositis, unspecified 1999  . Other and unspecified hyperlipidemia 2003  . Other disorders of vitreous 2003  . Other  malaise and fatigue 2003  . Other seborrheic keratosis 2013  . Other seborrheic keratosis 04/13/09  . Pain in joint, site unspecified 04/13/12  . Personal history of fall 04/15/11  . Restless legs syndrome (RLS) 1997  . 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 Unspecified hearing loss 2003  . Unspecified tinnitus 04/15/11   Past Surgical History:  Procedure Laterality Date  . APPENDECTOMY  Age 13  . boil removed     Dr. Donne Hazel  . C3-4 anterior cervical surgery  1999   Dr. Hal Neer  . CARDIAC CATHETERIZATION  2001   Dr. Glade Lloyd  . COLONOSCOPY  07/20/07   Dr. Sharlett Iles  . EYE SURGERY Bilateral 2015   cataracts  . LAMINOTOMY / EXCISION DISK POSTERIOR CERVICAL SPINE     Spinal Disk (?4-5)   Family History  Problem Relation Age of Onset  . Heart disease Father   . Diabetes Neg Hx   . Colon cancer Neg Hx   . Colon polyps Neg Hx   . Esophageal cancer Neg Hx   . Kidney disease Neg Hx   . Gallbladder disease Neg Hx    Social History   Socioeconomic History  . Marital status: Married    Spouse name: Not on file  . Number of children: 2  . Years of education: Not on file  . Highest education level: Not on file  Occupational History  . Occupation: Real [employer]  . Financial resource strain: Not hard at all  . Food insecurity:    Worry: Never true    Inability: Never true  . Transportation needs:    Medical: No    Non-medical: No  Tobacco Use  . Smoking status: Never Smoker  . Smokeless tobacco: Former Systems developer    Types: Chew  Substance and Sexual Activity  . Alcohol use: Yes    Alcohol/week: 0.0 standard drinks    Comment: Occas. Wine   . Drug use: No  . Sexual activity: Yes  Lifestyle  . Physical activity:    Days per week: 3 days    Minutes per session: 30 min  . Stress: Not at all  Relationships  . Social connections:    Talks on phone: More than three times a week    Gets together: More than three times a week    Attends  religious service: Never    Active member of club or organization: No    Attends meetings of clubs or organizations: Never    Relationship status: Married  Other Topics Concern  . Not on file  Social History Narrative  . Not on file   Tobacco Counseling Counseling given: Not Answered   Clinical Intake:  Pre-visit preparation completed: No  Pain : 0-10 Pain Score: 7  Pain Type: Acute pain Pain Location: Leg Pain Orientation: Left Pain Descriptors / Indicators: Aching, Sore Pain Onset: More than a month ago Pain Frequency: Intermittent     Diabetes: No  How often  do you need to have someone help you when you read instructions, pamphlets, or other written materials from your doctor or pharmacy?: 1 - Never What is the last grade level you completed in school?: 2 years college  Interpreter Needed?: No  Information entered by :: Tyson Dense, Rn  Activities of Daily Living In your present state of health, do you have any difficulty performing the following activities: 04/04/2018  Hearing? N  Vision? N  Difficulty concentrating or making decisions? N  Walking or climbing stairs? Y  Dressing or bathing? N  Doing errands, shopping? N  Preparing Food and eating ? N  Using the Toilet? N  In the past six months, have you accidently leaked urine? Y  Do you have problems with loss of bowel control? Y  Managing your Medications? N  Managing your Finances? N  Housekeeping or managing your Housekeeping? N  Some recent data might be hidden     Immunizations and Health Maintenance Immunization History  Administered Date(s) Administered  . DTaP 06/12/2011  . Influenza Whole 03/01/2013  . Influenza, High Dose Seasonal PF 03/03/2017, 03/01/2018  . Influenza,inj,Quad PF,6+ Mos 02/21/2014, 02/20/2015  . Influenza-Unspecified 01/31/2016  . Pneumococcal Conjugate-13 10/11/2013  . Pneumococcal Polysaccharide-23 03/01/2005, 04/01/2017  . Tdap 06/12/2011  . Zoster 10/15/2010    There are no preventive care reminders to display for this patient.  Patient Care Team: Gayland Curry, DO as PCP - General (Geriatric Medicine)  Indicate any recent Medical Services you may have received from other than [facility] providers in the past year (date may be approximate).    Assessment:   This is a routine wellness examination for [facility].  Hearing/Vision screen Hearing Screening Comments: Patient reports no issues with hearing Vision Screening Comments: Sees eye doctor annually  Dietary issues and exercise activities discussed: Current Exercise Habits: Home exercise routine, Type of exercise: Other - see comments(biking), Time (Minutes): 30, Frequency (Times/Week): 3, Weekly Exercise (Minutes/Week): 90, Intensity: Mild, Exercise limited by: orthopedic condition(s)  Goals   None    Depression Screen PHQ 2/9 Scores 04/04/2018 04/01/2017 08/21/2015 02/20/2015  PHQ - 2 Score 0 0 0 0    Fall Risk Fall Risk  04/04/2018 10/14/2017 04/29/2017 04/01/2017 01/07/2017  Falls in the past year? 1 No No No No  Number falls in past yr: 1 - - - -  Injury with Fall? 0 - - - -  Risk for fall due to : History of fall(s);Impaired balance/gait - - - -    Is the patient's home free of loose throw rugs in walkways, pet beds, electrical cords, etc?   yes      Grab bars in the bathroom? yes      Handrails on the stairs?   yes      Adequate lighting?   yes  Timed Get Up and Go performed: 20 seconds  Cognitive Function: MMSE - Mini Mental State Exam 04/04/2018  Orientation to time 5  Orientation to Place 5  Registration 3  Attention/ Calculation 5  Recall 2  Language- name 2 objects 2  Language- repeat 1  Language- follow 3 step command 3  Language- read & follow direction 1  Write a sentence 1  Copy design 1  Total score 29        Screening Tests Health Maintenance  Topic Date Due  . TETANUS/TDAP  06/11/2021  . INFLUENZA VACCINE  Completed  . PNA vac Low Risk Adult  Completed     Qualifies for Shingles  Vaccine? Yes, educated and ordered to pharmacy  Cancer Screenings: Lung: Low Dose CT Chest recommended if Age 33-80 years, 30 pack-year currently smoking OR have quit w/in 15years. Patient does not qualify. Colorectal: up to date  Additional Screenings:  Hepatitis C Screening: declined    Plan:    I have personally reviewed and addressed the Medicare Annual Wellness questionnaire and have noted the following in the patient's chart:  A. Medical and social history B. Use of alcohol, tobacco or illicit drugs  C. Current medications and supplements D. Functional ability and status E.  Nutritional status F.  Physical activity G. Advance directives H. List of other physicians I.  Hospitalizations, surgeries, and ER visits in previous 12 months J.  [place] to include hearing, vision, cognitive, depression L. Referrals and appointments - none  In addition, I have reviewed and discussed with patient certain preventive protocols, quality metrics, and best practice recommendations. A written personalized care plan for preventive services as well as general preventive health recommendations were provided to patient.  See attached scanned questionnaire for additional information.   Signed,   Tyson Dense, RN Nurse Health Advisor  Patient concerns: None

## 2018-04-04 NOTE — Addendum Note (Signed)
Addended by: Tyson Dense E on: 04/04/2018 10:31 AM   Modules accepted: Orders

## 2018-04-07 ENCOUNTER — Encounter: Payer: Self-pay | Admitting: Internal Medicine

## 2018-04-07 ENCOUNTER — Ambulatory Visit (INDEPENDENT_AMBULATORY_CARE_PROVIDER_SITE_OTHER): Payer: Medicare Other | Admitting: Internal Medicine

## 2018-04-07 ENCOUNTER — Ambulatory Visit
Admission: RE | Admit: 2018-04-07 | Discharge: 2018-04-07 | Disposition: A | Discharge status: Home / Self Care | Payer: Medicare Other | Source: Ambulatory Visit | Attending: Internal Medicine | Admitting: Internal Medicine

## 2018-04-07 VITALS — BP 140/84 | HR 65 | Temp 97.4°F | Resp 10 | Ht 67.0 in | Wt 163.0 lb

## 2018-04-07 DIAGNOSIS — I10 Essential (primary) hypertension: Secondary | ICD-10-CM | POA: Diagnosis not present

## 2018-04-07 DIAGNOSIS — R600 Localized edema: Secondary | ICD-10-CM | POA: Diagnosis not present

## 2018-04-07 DIAGNOSIS — I7102 Dissection of abdominal aorta: Secondary | ICD-10-CM

## 2018-04-07 DIAGNOSIS — E7800 Pure hypercholesterolemia, unspecified: Secondary | ICD-10-CM

## 2018-04-07 DIAGNOSIS — Z0000 Encounter for general adult medical examination without abnormal findings: Secondary | ICD-10-CM

## 2018-04-07 DIAGNOSIS — G119 Hereditary ataxia, unspecified: Secondary | ICD-10-CM

## 2018-04-07 DIAGNOSIS — B370 Candidal stomatitis: Secondary | ICD-10-CM

## 2018-04-07 DIAGNOSIS — R2242 Localized swelling, mass and lump, left lower limb: Secondary | ICD-10-CM

## 2018-04-07 DIAGNOSIS — Z23 Encounter for immunization: Secondary | ICD-10-CM

## 2018-04-07 DIAGNOSIS — F325 Major depressive disorder, single episode, in full remission: Secondary | ICD-10-CM | POA: Insufficient documentation

## 2018-04-07 DIAGNOSIS — M79662 Pain in left lower leg: Secondary | ICD-10-CM | POA: Diagnosis not present

## 2018-04-07 DIAGNOSIS — R1310 Dysphagia, unspecified: Secondary | ICD-10-CM

## 2018-04-07 MED ORDER — ZOSTER VAC RECOMB ADJUVANTED 50 MCG/0.5ML IM SUSR: 0.5000 mL | Freq: Once | INTRAMUSCULAR | 1 refills | Status: AC

## 2018-04-07 MED ORDER — HYDROCODONE-ACETAMINOPHEN 5-325 MG PO TABS: 1.0000 | ORAL_TABLET | Freq: Every day | ORAL | 0 refills | Status: AC | PRN

## 2018-04-07 MED ORDER — NYSTATIN 100000 UNIT/ML MT SUSP: 5.0000 mL | Freq: Four times a day (QID) | OROMUCOSAL | 0 refills | Status: DC

## 2018-04-07 NOTE — Progress Notes (Signed)
Provider:  Rexene Edison. Mariea Clonts, D.O., C.M.D. Location:   [facility]  Place of Service:   clinic  Previous PCP: Gayland Curry, DO Patient Care Team: Gayland Curry, DO as PCP - General (Geriatric Medicine)  Extended Emergency Contact Information Primary Emergency Contact: Toribio Harbour of [place] Phone: [phone] Relation: Daughter  Code Status: DNR Goals of Care: Advanced Directive information Advanced Directives 04/04/2018  Does Patient Have a Medical Advance Directive? Yes  Type of Paramedic of [place];Out of facility DNR (pink MOST or yellow form)  Does patient want to make changes to medical advance directive? No - Patient declined  Copy of [place] in Chart? No - copy requested  Would patient like information on creating a medical advance directive? -  Pre-existing out of facility DNR order (yellow form or pink MOST form) Yellow form placed in chart (order not valid for inpatient use)   Chief Complaint  Patient presents with  . Annual Exam    Yearly check-up, EKG, AWV completed Monday 04/04/18, MMSE 29/30, - fall risk, and - depression  . Medication Refill    Hydrocodone,  Database confirmed     HPI: Patient is a 77 y.o. male seen today for an annual physical exam.  Has something wrong with this leg.  When he was here a few months ago, Janett Billow recommended compression hose.  If he does not wear them, the left leg swells and the big toe swells.  There is a little bit of constant pain from it.  Last week when he wsa walking, something popped in the outside of his foot and he felt it in the back of his knee.  When he took his sock and shoe off, it still hurt when he was lying down.  Feels like something has gone to sleep in it down the medial shin area.  He's used a little aleve for his leg.     Right one is ok.    Still getting headaches across his frontal area.  Takes 1/2 a hydrocodone.  May take one whole one a  week.    Swallowing is about the same.    BP at upper limits of normal.  He blames [facility] for this.  Went to get his toprol. They didn't have it.    Cholesterol has trended up.  He's been eating too many starchy foods--potatoes, french fries and pasta.    Alk phos elevated mildly--was last check, as well.  Has gallbladder but no symptoms and other liver tests normal.  His fasting sugar is normal.  He checks his sugar sometimes when his wife pests him to do so when he's doing her cbgs.  His are normal.  Past Medical History:  Diagnosis Date  . Abnormality of gait September 07 2007  . Allergic rhinitis, cause unspecified 2007  . Arthritis   . Ataxia   . Cervicalgia February 26, 2006  . Chest pain   . Cramp of limb 10/21/11  . Degeneration of intervertebral disc, site unspecified 1998  . 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 Diaphragmatic hernia without mention of obstruction or gangrene 1982   Hiatal hernia  . Diplopia 2006  . Disturbance of skin sensation 2003  . Diverticulosis of colon (without mention of hemorrhage) 1997  . Dizziness and giddiness 2001  . Dysphagia, unspecified(787.20) 1999 and  . Esophagitis, unspecified 1997  . GERD (gastroesophageal reflux disease) 2003  . Hypertension 2003  . Impotence  of organic origin 1999  . Lack of coordination September 07, 2007  . Lumbago 2003  . Migraine with aura, without mention of intractable migraine without mention of status migrainosus 1997  . Myalgia and myositis, unspecified 1999  . Other and unspecified hyperlipidemia 2003  . Other disorders of vitreous 2003  . Other malaise and fatigue 2003  . Other seborrheic keratosis 2013  . Other seborrheic keratosis 04/13/09  . Pain in joint, site unspecified 04/13/12  . Personal history of fall 04/15/11  . Restless legs syndrome (RLS) 1997  . 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 Unspecified hearing loss 2003  . Unspecified tinnitus  04/15/11   Past Surgical History:  Procedure Laterality Date  . APPENDECTOMY  Age 20  . boil removed     Dr. Donne Hazel  . C3-4 anterior cervical surgery  1999   Dr. Hal Neer  . CARDIAC CATHETERIZATION  2001   Dr. Glade Lloyd  . COLONOSCOPY  07/20/07   Dr. Sharlett Iles  . EYE SURGERY Bilateral 2015   cataracts  . LAMINOTOMY / EXCISION DISK POSTERIOR CERVICAL SPINE     Spinal Disk (?4-5)    reports that he has never smoked. He has quit using smokeless tobacco.  His smokeless tobacco use included chew. He reports that he drinks alcohol. He reports that he does not use drugs.  Functional Status Survey:  independent except that walks with three-wheeled walker  Family History  Problem Relation Age of Onset  . Heart disease Father   . Diabetes Neg Hx   . Colon cancer Neg Hx   . Colon polyps Neg Hx   . Esophageal cancer Neg Hx   . Kidney disease Neg Hx   . Gallbladder disease Neg Hx     Health Maintenance  Topic Date Due  . TETANUS/TDAP  06/11/2021  . INFLUENZA VACCINE  Completed  . PNA vac Low Risk Adult  Completed    Allergies  Allergen Reactions  . Aciphex [Rabeprazole Sodium]   . Effexor [Venlafaxine Hcl]   . Serzone [Nefazodone]   . Vivactil [Protriptyline Hcl]     Outpatient Encounter Medications as of 04/07/2018  Medication Sig  . amLODipine (NORVASC) 5 MG tablet TAKE 1 TABLET BY MOUTH EVERY DAY  . celecoxib (CELEBREX) 200 MG capsule TAKE 1 CAPSULE BY MOUTH EVERY DAY  . clotrimazole-betamethasone (LOTRISONE) cream Apply 1 application topically daily. To groin rash  . HYDROcodone-acetaminophen (NORCO/VICODIN) 5-325 MG tablet Take 1 tablet by mouth daily as needed for moderate pain (severe headache).  . losartan (COZAAR) 100 MG tablet TAKE 1 TABLET BY MOUTH EVERY DAY  . metoprolol succinate (TOPROL-XL) 25 MG 24 hr tablet Take 1 tablet (25 mg total) by mouth daily.  . mometasone (NASONEX) 50 MCG/ACT nasal spray Place 2 sprays into the nose daily.   Marland Kitchen oxybutynin (DITROPAN)  5 MG tablet Take one tablet by mouth up to three times daily to help bladder control.  . pantoprazole (PROTONIX) 40 MG tablet TAKE 1 TABLET BY MOUTH EVERY DAY  . sildenafil (REVATIO) 20 MG tablet Take 2-5 tablets prior to intercourse   No facility-administered encounter medications on file as of 04/07/2018.     Review of Systems  Constitutional: Negative for chills and fever.  HENT: Negative for congestion.        Inside left ear, has had recurrent scaly area that has drainage after the scale falls off  Eyes: Negative for blurred vision.  Respiratory: Negative for cough and shortness of breath.   Cardiovascular:  Negative for chest pain and palpitations.  Gastrointestinal: Negative for abdominal pain, blood in stool, constipation, diarrhea, heartburn, melena, nausea and vomiting.  Genitourinary: Negative for dysuria.  Musculoskeletal: Negative for falls.       Left lateral foot pain, numbness of top of foot and anterior shin  Neurological: Positive for sensory change and weakness. Negative for dizziness.       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 The patient is not nervous/anxious and does not have insomnia.     Vitals:   04/07/18 1335  BP: 140/84  Pulse: 65  Resp: 10  Temp: (!) 97.4 F (36.3 C)  TempSrc: Oral  SpO2: 97%  Weight: 163 lb (73.9 kg)  Height: 5' 7"  (1.702 m)   Body mass index is 25.53 kg/m. Physical Exam  Constitutional: He is oriented to person, place, and time. He appears well-developed. No distress.  HENT:  Head: Normocephalic and atraumatic.  Right Ear: External ear normal.  Nose: Nose normal.  Mouth/Throat: Oropharynx is clear and moist. No oropharyngeal exudate.  White patches on tongue; scaly area inside ear with some clear drainage  Eyes: Pupils are equal, round, and reactive to light. Conjunctivae and EOM are normal.  Neck: Neck supple. No JVD present.  Cardiovascular: Normal  rate, regular rhythm, normal heart sounds and intact distal pulses.  Pulmonary/Chest: Effort normal and breath sounds normal. No respiratory distress.  Abdominal: Bowel sounds are normal. He exhibits no distension. There is no tenderness.  Musculoskeletal: Normal range of motion.  No localized tenderness on exam to foot; slight erythema of left great toe vs right and mild swelling left vs right   Lymphadenopathy:    He has no cervical adenopathy.  Neurological: He is alert and oriented to person, place, and time.  Dysarthria; short steps, walks with rollator walker (three-wheeled); breathing noises between words, some difficulty retrieving words at times  Skin: Skin is warm and dry. Capillary refill takes less than 2 seconds.  Psychiatric: He has a normal mood and affect.    Labs reviewed: Basic Metabolic Panel: Recent Labs    09/28/17 0817 04/04/18 0946  NA 140 140  K 4.8 4.7  CL 106 104  CO2 29 29  GLUCOSE 100* 94  BUN 17 15  CREATININE 1.00 0.97  CALCIUM 9.5 9.7   Liver Function Tests: Recent Labs    09/28/17 0817 04/04/18 0946  AST 15 15  ALT 16 14  BILITOT 0.6 0.8  PROT 6.5 6.7   No results for input(s): LIPASE, AMYLASE in the last 8760 hours. No results for input(s): AMMONIA in the last 8760 hours. CBC: Recent Labs    09/28/17 0817 04/04/18 0946  WBC 6.1 6.3  NEUTROABS 3,538 3,704  HGB 15.0 15.9  HCT 43.7 46.4  MCV 87.8 88.7  PLT 187 210   Cardiac Enzymes: No results for input(s): CKTOTAL, CKMB, CKMBINDEX, TROPONINI in the last 8760 hours. BNP: Invalid input(s): POCBNP No results found for: HGBA1C Lab Results  Component Value Date   TSH 1.790 04/12/2013   Lab Results  Component Value Date   VITAMINB12 246 02/19/2014   Assessment/Plan 1. Localized swelling of left lower leg -new onset, does improve with use of compression sock as recommended by NP, but etiology not clear--why asymmetric?  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  Dispense: 30 tablet; Refill: 0  3. Essential hypertension - bp at goal at home--a little higher here - EKG 12-Lead  4. Pure hypercholesterolemia -not on meds, says he will work to improve his diet between now and six months - Lipid panel; Future  5. Dysphagia, unspecified type - stable per patient recently, monitor, related to his spinocerebellar disease  6. Dissection of abdominal aorta (HCC) -has f/u imaging at the vascular lab in august  7. Oral thrush - nystatin (MYCOSTATIN) 100000 UNIT/ML suspension; Take 5 mLs (500,000 Units total) by mouth 4 (four) times daily. Swish and spit  Dispense: 60 mL; Refill: 0  8. Need for shingles vaccine - Zoster Vaccine Adjuvanted [facility]) injection; Inject 0.5 mLs into the muscle once for 1 dose.  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 US Venous Img Lower Unilateral Left    Standing Status:   Future    Standing Expiration Date:   06/08/2019    Order Specific Question:   Reason for Exam (SYMPTOM  OR DIAGNOSIS REQUIRED)    Answer:   left leg swelling, pain great toe, numbness anterior shin, sensation of popping left lateral foot and behind knee    Order Specific Question:   Preferred imaging location?    Answer:   [facility]  . Lipid panel    Standing Status:   Future    Standing Expiration Date:   04/08/2019    Order Specific Question:   Has the patient fasted?    Answer:   Yes  . EKG 12-Lead    6 mos med mgt  Jaksen Fiorella L. Vitali Seibert, D.O. [facility] [address], [place] [address] Cell Phone (Mon-Fri 8am-5pm):  [phone] On Call:  [phone] & follow prompts after 5pm & weekends Office Phone:  [phone] Office Fax:  [phone]

## 2018-04-15 ENCOUNTER — Other Ambulatory Visit: Payer: Self-pay | Admitting: Internal Medicine

## 2018-04-15 DIAGNOSIS — N3941 Urge incontinence: Secondary | ICD-10-CM

## 2018-04-16 ENCOUNTER — Other Ambulatory Visit: Payer: Self-pay | Admitting: Internal Medicine

## 2018-04-16 DIAGNOSIS — K219 Gastro-esophageal reflux disease without esophagitis: Secondary | ICD-10-CM

## 2018-04-18 NOTE — Telephone Encounter (Signed)
Received very high risk for Allergy Contraindication. Please advise on medication refill

## 2018-04-18 NOTE — Telephone Encounter (Signed)
Pt has been taking.

## 2018-04-19 ENCOUNTER — Other Ambulatory Visit: Payer: Self-pay | Admitting: Internal Medicine

## 2018-04-19 DIAGNOSIS — B370 Candidal stomatitis: Secondary | ICD-10-CM

## 2018-04-19 NOTE — Telephone Encounter (Signed)
A medication refill was received from pharmacy for nystatin suspension. I called patient to see if he needed a refill and he stated that he is still having thrush symptoms and he requested the refill. Request was pended to provider for approval.

## 2018-05-03 ENCOUNTER — Other Ambulatory Visit: Payer: Self-pay | Admitting: Internal Medicine

## 2018-05-03 ENCOUNTER — Encounter: Payer: Self-pay | Admitting: Internal Medicine

## 2018-05-03 NOTE — Addendum Note (Signed)
Addended by: Gayland Curry on: 05/03/2018 09:43 AM   Modules accepted: Orders

## 2018-05-09 ENCOUNTER — Ambulatory Visit (INDEPENDENT_AMBULATORY_CARE_PROVIDER_SITE_OTHER): Payer: Medicare Other | Admitting: Orthopedic Surgery

## 2018-05-09 ENCOUNTER — Encounter (INDEPENDENT_AMBULATORY_CARE_PROVIDER_SITE_OTHER): Payer: Self-pay | Admitting: Orthopedic Surgery

## 2018-05-09 VITALS — Ht 67.0 in | Wt 163.0 lb

## 2018-05-09 DIAGNOSIS — M79672 Pain in left foot: Secondary | ICD-10-CM

## 2018-05-09 NOTE — Progress Notes (Signed)
Office Visit Note   Patient: Justin [name]           Date of Birth: May 17, 1941           MRN: [id number] Visit Date: 05/09/2018              Requested by: Gayland Curry, DO [place], [place] [address] PCP: Gayland Curry, DO  Chief Complaint  Patient presents with  . Left Leg - Pain  . Left Foot - Pain      HPI: Patient is a 77 year old gentleman who was seen for initial evaluation for left foot pain.  Patient states he has had swelling in the leg he has been wearing compression stockings.  He states he did have an ultrasound that was negative for DVT.  Patient states when he was walking about 3 months ago he felt an acute pop over the dorsum of the foot at the base of the fifth metatarsal.  Patient states he did have bruising at that time he states that it is not symptomatic at this time he denies any warmth or redness denies any history of gout.  Assessment & Plan: Visit Diagnoses:  1. Pain in left foot     Plan: Recommended stiff soled walking sneakers and follow-up as needed.  Follow-Up Instructions: Return if symptoms worsen or fail to improve.   Ortho Exam  Patient is alert, oriented, no adenopathy, well-dressed, normal affect, normal respiratory effort. Examination patient has good pulses he has good range of motion the ankle and subtalar joint.  He has some mild venous swelling in the left leg but no venous ulcers.  The Achilles tendon is nontender to palpation no nodular changes he has no tenderness to palpation over the posterior tibial tendon or the peroneal tendons.  He has good inversion and eversion strength.  He has no tenderness to palpation across the metatarsals no signs of any symptoms of a fracture at this time.  Patient most likely did have a stress fracture which has healed.  Imaging: No results found. No images are attached to the encounter.  Labs: Lab Results  Component Value Date   GRAMSTAIN Few 12/30/2016   GRAMSTAIN WBC  present-predominately Mononuclear 12/30/2016   GRAMSTAIN No Squamous Epithelial Cells Seen 12/30/2016   GRAMSTAIN Moderate Gram Positive Cocci In Pairs 12/30/2016   GRAMSTAIN Moderate Gram Negative Rods 12/30/2016   LABORGA METHICILLIN RESISTANT STAPHYLOCOCCUS AUREUS 12/30/2016     Lab Results  Component Value Date   ALBUMIN 3.3 (L) 11/26/2016   ALBUMIN 3.8 11/25/2016   ALBUMIN 4.1 02/28/2016    Body mass index is 25.53 kg/m.  Orders:  No orders of the defined types were placed in this encounter.  No orders of the defined types were placed in this encounter.    Procedures: No procedures performed  Clinical Data: No additional findings.  ROS:  All other systems negative, except as noted in the HPI. Review of Systems  Objective: Vital Signs: Ht 5\' 7"  (1.702 m)   Wt 163 lb (73.9 kg)   BMI 25.53 kg/m   Specialty Comments:  No specialty comments available.  PMFS History: Patient Active Problem List   Diagnosis Date Noted  . Major depressive disorder with single episode, in full remission ([place]) 04/07/2018  . Dissection of abdominal aorta ([place]) 12/21/2016  . Urgency incontinence 04/01/2016  . History of fall 08/21/2015  . 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 Internal hemorrhoids  without complication 52/84/1324  . Arthritis 02/21/2014  . Dysarthria 02/21/2014  . Seborrheic keratosis 02/21/2014  . 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 Tinnitus 04/12/2013  . 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[place]) 10/16/2012  . 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 Essential hypertension   . GERD (gastroesophageal reflux disease)   . Dyslipidemia   . Lumbago   . Dysphagia    Past Medical History:  Diagnosis Date  . Abnormality of gait September 07 2007  . Allergic rhinitis, cause unspecified 2007  . Arthritis   . Ataxia   . Cervicalgia February 26, 2006  . Chest pain   . Cramp of limb 10/21/11  . Degeneration of intervertebral  disc, site unspecified 1998  . 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 Diaphragmatic hernia without mention of obstruction or gangrene 1982   Hiatal hernia  . Diplopia 2006  . Disturbance of skin sensation 2003  . Diverticulosis of colon (without mention of hemorrhage) 1997  . Dizziness and giddiness 2001  . Dysphagia, unspecified(787.20) 1999 and  . Esophagitis, unspecified 1997  . GERD (gastroesophageal reflux disease) 2003  . Hypertension 2003  . Impotence of organic origin 1999  . Lack of coordination September 07, 2007  . Lumbago 2003  . Migraine with aura, without mention of intractable migraine without mention of status migrainosus 1997  . Myalgia and myositis, unspecified 1999  . Other and unspecified hyperlipidemia 2003  . Other disorders of vitreous 2003  . Other malaise and fatigue 2003  . Other seborrheic keratosis 2013  . Other seborrheic keratosis 04/13/09  . Pain in joint, site unspecified 04/13/12  . Personal history of fall 04/15/11  . Restless legs syndrome (RLS) 1997  . 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 Unspecified hearing loss 2003  . Unspecified tinnitus 04/15/11    Family History  Problem Relation Age of Onset  . Heart disease Father   . Diabetes Neg Hx   . Colon cancer Neg Hx   . Colon polyps Neg Hx   . Esophageal cancer Neg Hx   . Kidney disease Neg Hx   . Gallbladder disease Neg Hx     Past Surgical History:  Procedure Laterality Date  . APPENDECTOMY  Age 30  . boil removed     Dr. Donne Hazel  . C3-4 anterior cervical surgery  1999   Dr. Hal Neer  . CARDIAC CATHETERIZATION  2001   Dr. Glade Lloyd  . COLONOSCOPY  07/20/07   Dr. Sharlett Iles  . EYE SURGERY Bilateral 2015   cataracts  . LAMINOTOMY / EXCISION DISK POSTERIOR CERVICAL SPINE     Spinal Disk (?4-5)   Social History   Occupational History  . Occupation: Real Estate  Tobacco Use  . Smoking status: Never Smoker  . Smokeless tobacco:  Former Systems developer    Types: Chew  Substance and Sexual Activity  . Alcohol use: Yes    Alcohol/week: 0.0 standard drinks    Comment: Occas. Wine   . Drug use: No  . Sexual activity: Yes

## 2018-05-18 ENCOUNTER — Other Ambulatory Visit: Payer: Self-pay | Admitting: *Deleted

## 2018-05-18 NOTE — Telephone Encounter (Signed)
Patient caregiver, Kenney Houseman called and requested refill for patient for Hydrocodone.   Medication Hydrocodone 5-325 is no longer in Current medication list.   Is it ok to add it back and refill.   [place] Verified LR: 04/07/2018

## 2018-05-18 NOTE — Telephone Encounter (Signed)
Yes, he's been having foot pain and takes a half a tablet rarely when needed.  Not sure how it came off his list b/c my last note shows where he requested it early november.

## 2018-05-18 NOTE — Telephone Encounter (Signed)
Medication list updated. It looks like medication was removed on 11/7.   [place] Verified LR: 04/07/2018 #30.  Pended Rx and sent to Dr. Mariea Clonts for approval.

## 2018-05-19 NOTE — Telephone Encounter (Signed)
Justin [name] called checking on Rx Refill. Informed her that it would be sent by 5pm today.

## 2018-05-20 MED ORDER — HYDROCODONE-ACETAMINOPHEN 5-325 MG PO TABS: ORAL_TABLET | ORAL | 0 refills | Status: DC

## 2018-06-07 DIAGNOSIS — Z0120 Encounter for dental examination and cleaning without abnormal findings: Secondary | ICD-10-CM | POA: Diagnosis not present

## 2018-07-08 ENCOUNTER — Other Ambulatory Visit: Payer: Self-pay | Admitting: *Deleted

## 2018-07-08 NOTE — Telephone Encounter (Signed)
[name], Caregiver called and stated that patient needs a refill on his Hydrocodone.   [place] Verified 06/07/2018- #12 received-written by Rogue Bussing. Kenney Houseman stated that patient had to have a root canal and provider wrote it. I do not see a Narcotic Contract in patient's chart. (placed note on Appointment 10/06/18 to have one signed) Explained to [place] that patient is not to be receiving narcotics from multiply providers.   05/21/2019-#30 received-written by Dr. Mariea Clonts.   Pended Rx and sent to Dr. Mariea Clonts for approval.

## 2018-07-11 MED ORDER — HYDROCODONE-ACETAMINOPHEN 5-325 MG PO TABS: ORAL_TABLET | ORAL | 0 refills | Status: DC

## 2018-09-07 ENCOUNTER — Other Ambulatory Visit: Payer: Self-pay | Admitting: *Deleted

## 2018-09-07 NOTE — Telephone Encounter (Signed)
Patient requested refill [place] Verified LR: 07/18/2018 Pended Rx and sent to Dr. Mariea Clonts for approval.

## 2018-09-08 MED ORDER — HYDROCODONE-ACETAMINOPHEN 5-325 MG PO TABS: ORAL_TABLET | ORAL | 0 refills | Status: DC

## 2018-09-22 ENCOUNTER — Other Ambulatory Visit: Payer: Self-pay | Admitting: Internal Medicine

## 2018-09-22 DIAGNOSIS — I10 Essential (primary) hypertension: Secondary | ICD-10-CM

## 2018-09-27 ENCOUNTER — Encounter: Payer: Self-pay | Admitting: Internal Medicine

## 2018-09-30 ENCOUNTER — Other Ambulatory Visit: Payer: Self-pay

## 2018-09-30 ENCOUNTER — Telehealth: Payer: Self-pay | Admitting: *Deleted

## 2018-09-30 ENCOUNTER — Other Ambulatory Visit: Payer: Medicare Other

## 2018-09-30 DIAGNOSIS — E7800 Pure hypercholesterolemia, unspecified: Secondary | ICD-10-CM | POA: Diagnosis not present

## 2018-09-30 NOTE — Telephone Encounter (Signed)
Received form in the mail from patient for Application for Disability License Plate form.  Placed in Dr. Cyndi Lennert folder to review and sign.  To be mailed back to patient once signed.

## 2018-10-01 LAB — LIPID PANEL
Cholesterol: 197 mg/dL (ref <200)
HDL: 54 mg/dL (ref >=40)
LDL Cholesterol (Calc): 119 mg/dL (calc) — ABNORMAL HIGH
Non-HDL Cholesterol (Calc): 143 mg/dL (calc) — ABNORMAL HIGH (ref <130)
Total CHOL/HDL Ratio: 3.6 (calc) (ref <5.0)
Triglycerides: 127 mg/dL (ref <150)

## 2018-10-06 ENCOUNTER — Encounter: Payer: Self-pay | Admitting: Internal Medicine

## 2018-10-06 ENCOUNTER — Other Ambulatory Visit: Payer: Self-pay

## 2018-10-06 ENCOUNTER — Ambulatory Visit (INDEPENDENT_AMBULATORY_CARE_PROVIDER_SITE_OTHER): Payer: Medicare Other | Admitting: Internal Medicine

## 2018-10-06 DIAGNOSIS — B370 Candidal stomatitis: Secondary | ICD-10-CM | POA: Diagnosis not present

## 2018-10-06 DIAGNOSIS — E7800 Pure hypercholesterolemia, unspecified: Secondary | ICD-10-CM | POA: Diagnosis not present

## 2018-10-06 DIAGNOSIS — R1310 Dysphagia, unspecified: Secondary | ICD-10-CM

## 2018-10-06 DIAGNOSIS — G119 Hereditary ataxia, unspecified: Secondary | ICD-10-CM | POA: Diagnosis not present

## 2018-10-06 DIAGNOSIS — R350 Frequency of micturition: Secondary | ICD-10-CM | POA: Insufficient documentation

## 2018-10-06 DIAGNOSIS — I10 Essential (primary) hypertension: Secondary | ICD-10-CM

## 2018-10-06 MED ORDER — NYSTATIN 100000 UNIT/ML MT SUSP: OROMUCOSAL | 0 refills | Status: DC

## 2018-10-06 NOTE — Progress Notes (Signed)
Patient ID: Justin [name], male   DOB: [birth year], 78 y.o.   MRN: [id number] This service is provided via telemedicine  No vital signs collected/recorded due to the encounter was a telemedicine visit.   Location of patient (ex: home, work):  HOME  Patient consents to a telephone visit:  YES  Location of the provider (ex: office, home):  OFFICE  Name of any referring provider:  DR [facility] Labria Wos DO  Names of all persons participating in the telemedicine service and their role in the encounter:  PATIENT, [place], DR Jonelle Sidle Laurens Matheny DO  Time spent on call:  6:45    Provider:  Tashari Schoenfelder L. Mariea Clonts, D.O., C.M.D.  Code Status: DNR Goals of Care:  Advanced Directives 04/04/2018  Does Patient Have a Medical Advance Directive? Yes  Type of Paramedic of [place];Out of facility DNR (pink MOST or yellow form)  Does patient want to make changes to medical advance directive? No - Patient declined  Copy of [place] in Chart? No - copy requested  Would patient like information on creating a medical advance directive? -  Pre-existing out of facility DNR order (yellow form or pink MOST form) Yellow form placed in chart (order not valid for inpatient use)     Chief Complaint  Patient presents with  . Medical Management of Chronic Issues    6MTH FOLLOW-UP    HPI: Patient is a 78 y.o. male seen today virtually via doxy.me for medical management of chronic diseases.  Virtual call id fail and had to be completed by phone as pt's browser was not supporting the program and he could not hear me, only see me.  We completed the rest of the visit by phone.  He's feeling pretty good.    He still has a problem with his left leg.  He wears hose.  There was no blood clot.  There were no fractures.  It does swell up w/o the hose.  It does hurt some around the left ankle.    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  The other day, he tripped.  He  strained his right side, but nothing major so far.  Got right back up and went home.    Bladder control:  He takes the ditropan once a day which does help.  If he takes more, he gets too constipated.    His swallowing is getting a little bit worse.  He has not choked.  Sometimes things do go down the wrong way and he has to cough to get it straight.  It's happening every day now.  He's worried that he should get the swallowing test I recommended when we get through the covid isolation.  He did have speech therapy 2-3 years ago at [facility] when Dr. Nyoka Cowden recommended it.    He is having a little trouble with some yeast in his mouth.  He needs more mouthwash.  His wife is doing about the same--she gets more agitated spells and severe anxiety.  She sometimes does not know him.  Overall she's not too different.    We reviewed what foods are good to raise the good cholesterol and also discussed that his bad cholesterol could be improved with more exercise and avoiding high fat foods.  He uses the hydrocodone rarely for headaches.  He thinks they come from eye strain from too much computer use.  He is due to go so we talked about lenses to protect  him from that.    Past Medical History:  Diagnosis Date  . Abnormality of gait September 07 2007  . Allergic rhinitis, cause unspecified 2007  . Arthritis   . Ataxia   . Cervicalgia February 26, 2006  . Chest pain   . Cramp of limb 10/21/11  . Degeneration of intervertebral disc, site unspecified 1998  . 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 Diaphragmatic hernia without mention of obstruction or gangrene 1982   Hiatal hernia  . Diplopia 2006  . Disturbance of skin sensation 2003  . Diverticulosis of colon (without mention of hemorrhage) 1997  . Dizziness and giddiness 2001  . Dysphagia, unspecified(787.20) 1999 and  . Esophagitis, unspecified 1997  . GERD (gastroesophageal reflux disease) 2003  . Hypertension 2003  .  Impotence of organic origin 1999  . Lack of coordination September 07, 2007  . Lumbago 2003  . Migraine with aura, without mention of intractable migraine without mention of status migrainosus 1997  . Myalgia and myositis, unspecified 1999  . Other and unspecified hyperlipidemia 2003  . Other disorders of vitreous 2003  . Other malaise and fatigue 2003  . Other seborrheic keratosis 2013  . Other seborrheic keratosis 04/13/09  . Pain in joint, site unspecified 04/13/12  . Personal history of fall 04/15/11  . Restless legs syndrome (RLS) 1997  . 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 Unspecified hearing loss 2003  . Unspecified tinnitus 04/15/11    Past Surgical History:  Procedure Laterality Date  . APPENDECTOMY  Age 47  . boil removed     Dr. Donne Hazel  . C3-4 anterior cervical surgery  1999   Dr. Hal Neer  . CARDIAC CATHETERIZATION  2001   Dr. Glade Lloyd  . COLONOSCOPY  07/20/07   Dr. Sharlett Iles  . EYE SURGERY Bilateral 2015   cataracts  . LAMINOTOMY / EXCISION DISK POSTERIOR CERVICAL SPINE     Spinal Disk (?4-5)    Allergies  Allergen Reactions  . Aciphex [Rabeprazole Sodium]   . Effexor [Venlafaxine Hcl]   . Serzone [Nefazodone]   . Vivactil [Protriptyline Hcl]     Outpatient Encounter Medications as of 10/06/2018  Medication Sig  . amLODipine (NORVASC) 5 MG tablet TAKE 1 TABLET BY MOUTH EVERY DAY  . celecoxib (CELEBREX) 200 MG capsule TAKE 1 CAPSULE BY MOUTH EVERY DAY  . clotrimazole-betamethasone (LOTRISONE) cream Apply 1 application topically daily. To groin rash  . HYDROcodone-acetaminophen (NORCO/VICODIN) 5-325 MG tablet Take 1/2 tablet by mouth once daily as needed for pain  . losartan (COZAAR) 100 MG tablet TAKE 1 TABLET BY MOUTH EVERY DAY  . metoprolol succinate (TOPROL-XL) 25 MG 24 hr tablet Take 1 tablet (25 mg total) by mouth daily.  . mometasone (NASONEX) 50 MCG/ACT nasal spray Place 2 sprays into the nose daily.   Marland Kitchen nystatin (MYCOSTATIN) 100000  UNIT/ML suspension SWISH AND SPIT WITH 5MLS BY MOUTH 4 TIMES DAILY  . oxybutynin (DITROPAN) 5 MG tablet Take 5 mg by mouth daily.  . pantoprazole (PROTONIX) 40 MG tablet TAKE 1 TABLET BY MOUTH EVERY DAY  . sildenafil (REVATIO) 20 MG tablet Take 2-5 tablets prior to intercourse  . [DISCONTINUED] oxybutynin (DITROPAN) 5 MG tablet TAKE 1 TABLET BY MOUTH UP TO 3 TIMES DAILY TO HELP BLADDER CONTROL   No facility-administered encounter medications on file as of 10/06/2018.     Review of Systems:  Review of Systems  Constitutional: Negative for chills, fever and malaise/fatigue.  HENT: Positive for hearing loss.  Eyes: Negative for blurred vision.       Glasses; tired eyes from computer use  Respiratory: Negative for shortness of breath.   Cardiovascular: Positive for leg swelling. Negative for chest pain and palpitations.  Gastrointestinal: Negative for abdominal pain, blood in stool, constipation, diarrhea and melena.  Genitourinary: Negative for dysuria.  Skin: Negative for itching and rash.  Neurological: Negative for dizziness.       Chronic balance and speech difficulties; worsening swallowing problems  Psychiatric/Behavioral: Negative for depression and memory loss. The patient is not nervous/anxious and does not have insomnia.     Health Maintenance  Topic Date Due  . INFLUENZA VACCINE  12/31/2018  . TETANUS/TDAP  06/11/2021  . PNA vac Low Risk Adult  Completed    Physical Exam: Physical Exam  Constitutional: He is oriented to person, place, and time. He appears well-developed and well-nourished. No distress.  HENT:  Head: Normocephalic and atraumatic.  Pulmonary/Chest: Effort normal.  Neurological: He is alert and oriented to person, place, and time. He displays abnormal reflex. He exhibits abnormal muscle tone. Coordination abnormal.  Unsteady gait, uses three-wheeled walker; dysarthric speech  Psychiatric: He has a normal mood and affect.    Labs reviewed: Basic  Metabolic Panel: Recent Labs    04/04/18 0946  NA 140  K 4.7  CL 104  CO2 29  GLUCOSE 94  BUN 15  CREATININE 0.97  CALCIUM 9.7   Liver Function Tests: Recent Labs    04/04/18 0946  AST 15  ALT 14  BILITOT 0.8  PROT 6.7   No results for input(s): LIPASE, AMYLASE in the last 8760 hours. No results for input(s): AMMONIA in the last 8760 hours. CBC: Recent Labs    04/04/18 0946  WBC 6.3  NEUTROABS 3,704  HGB 15.9  HCT 46.4  MCV 88.7  PLT 210   Lipid Panel: Recent Labs    04/04/18 0946 09/30/18 0843  CHOL 209* 197  HDL 53 54  LDLCALC 128* 119*  TRIG 159* 127  CHOLHDL 3.9 3.6   No results found for: HGBA1C  Assessment/Plan 1. 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 Essential hypertension -bps controlled at home, cont same regimen  3. Pure hypercholesterolemia -improve but not to goal, cont to work on diet; wants to avoid meds  4. Oral thrush -still bothersome at times, renew: - nystatin (MYCOSTATIN) 100000 UNIT/ML suspension; SWISH AND SPIT WITH 5MLS BY MOUTH 4 TIMES DAILY  Dispense: 60 mL; Refill: 0  5.  Urinary frequency -on oxybutynin per urology with some benefit, more than one pill causes constipation so just uses one per day  6.  Dysphagia -worsening with daily problems now, due to #1, plan to address as above  Labs/tests ordered:  No orders of the defined types were placed in this encounter.  Next appt:  6 mos, but will order swallow study as soon as routine imaging available readily (likely when his wife comes for her visit)  Non face-to-face time spent on televisit:  28 minutes  Lodie Waheed L. Tanee Henery, D.O. [facility] [address], [place] [address] Cell Phone (Mon-Fri 8am-5pm):  [phone] On Call:  [phone] & follow prompts after 5pm &  weekends Office Phone:  [phone] Office Fax:  [phone]

## 2018-10-14 ENCOUNTER — Other Ambulatory Visit: Payer: Self-pay | Admitting: Internal Medicine

## 2018-10-26 ENCOUNTER — Other Ambulatory Visit: Payer: Self-pay | Admitting: Internal Medicine

## 2018-10-26 DIAGNOSIS — M545 Low back pain, unspecified: Secondary | ICD-10-CM

## 2018-10-26 DIAGNOSIS — G8929 Other chronic pain: Secondary | ICD-10-CM

## 2018-11-11 DIAGNOSIS — H43813 Vitreous degeneration, bilateral: Secondary | ICD-10-CM | POA: Diagnosis not present

## 2018-11-11 DIAGNOSIS — H16221 Keratoconjunctivitis sicca, not specified as Sjogren's, right eye: Secondary | ICD-10-CM | POA: Diagnosis not present

## 2018-11-11 DIAGNOSIS — Z961 Presence of intraocular lens: Secondary | ICD-10-CM | POA: Diagnosis not present

## 2018-11-16 ENCOUNTER — Other Ambulatory Visit: Payer: Self-pay | Admitting: *Deleted

## 2018-11-16 MED ORDER — HYDROCODONE-ACETAMINOPHEN 5-325 MG PO TABS: ORAL_TABLET | ORAL | 0 refills | Status: DC

## 2018-11-16 NOTE — Telephone Encounter (Signed)
Kenney Houseman, Caregiver Requested [place] Verified LR: 09/15/2018 Pended Rx and sent to Dr. Mariea Clonts for approval.

## 2018-11-17 ENCOUNTER — Other Ambulatory Visit: Payer: Self-pay | Admitting: Internal Medicine

## 2018-11-17 MED ORDER — HYDROCODONE-ACETAMINOPHEN 5-325 MG PO TABS: ORAL_TABLET | ORAL | 0 refills | Status: DC

## 2018-11-17 NOTE — Telephone Encounter (Signed)
[place] at beach and would like to get medication filled at [facility] near their location. Database verified LR 09/15/2018. Sending to Dr. Mariea Clonts for approval.

## 2018-11-17 NOTE — Addendum Note (Signed)
Addended by: Ruthell Rummage A on: 11/17/2018 03:33 PM   Modules accepted: Orders

## 2018-12-02 ENCOUNTER — Other Ambulatory Visit: Payer: Self-pay | Admitting: Internal Medicine

## 2018-12-08 ENCOUNTER — Other Ambulatory Visit: Payer: Self-pay | Admitting: Internal Medicine

## 2018-12-08 DIAGNOSIS — I10 Essential (primary) hypertension: Secondary | ICD-10-CM

## 2018-12-12 ENCOUNTER — Encounter: Payer: Self-pay | Admitting: Internal Medicine

## 2018-12-13 MED ORDER — LOSARTAN POTASSIUM 100 MG PO TABS: 100.0000 mg | ORAL_TABLET | Freq: Every day | ORAL | 3 refills | Status: DC

## 2018-12-13 NOTE — Telephone Encounter (Signed)
Ok to fill 

## 2019-01-04 ENCOUNTER — Other Ambulatory Visit: Payer: Self-pay

## 2019-01-04 ENCOUNTER — Ambulatory Visit (INDEPENDENT_AMBULATORY_CARE_PROVIDER_SITE_OTHER): Payer: Medicare Other | Admitting: Vascular Surgery

## 2019-01-04 ENCOUNTER — Ambulatory Visit (HOSPITAL_COMMUNITY)
Admission: RE | Admit: 2019-01-04 | Discharge: 2019-01-04 | Disposition: A | Discharge status: Home / Self Care | Payer: Medicare Other | Source: Ambulatory Visit | Attending: Vascular Surgery | Admitting: Vascular Surgery

## 2019-01-04 ENCOUNTER — Encounter: Payer: Self-pay | Admitting: Vascular Surgery

## 2019-01-04 VITALS — BP 157/84 | HR 66 | Temp 97.6°F | Resp 20 | Ht 67.0 in | Wt 163.0 lb

## 2019-01-04 DIAGNOSIS — I7102 Dissection of abdominal aorta: Secondary | ICD-10-CM | POA: Insufficient documentation

## 2019-01-04 NOTE — Progress Notes (Signed)
Patient name: Justin [name] MRN: [id number] DOB: 04-Jun-1940 Sex: male  REASON FOR VISIT:   Follow-up of aortic dissection  HPI:   Justin [name] is a pleasant 78 y.o. male who I saw in consultation on 01/06/2017.  He was having chest pain and shortness of breath and this prompted a CT angiogram of the chest abdomen and pelvis.  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  After reviewing the films I thought that the small intimal defect was likely chronic.  There was no evidence of aneurysmal disease.  He was asymptomatic.  I recommended a follow-up ultrasound and he comes in for a follow-up study.  Since I saw him last he denies any abdominal pain or back pain.  He is somewhat debilitated and ambulatory with a walker.  He says that his blood pressure has been under good control although today it was slightly elevated.  He is not a smoker.  He has had some left leg swelling and did have a duplex elsewhere that showed no evidence of DVT.  Past Medical History:  Diagnosis Date  . Abnormality of gait September 07 2007  . Allergic rhinitis, cause unspecified 2007  . Arthritis   . Ataxia   . Cervicalgia February 26, 2006  . Chest pain   . Cramp of limb 10/21/11  . Degeneration of intervertebral disc, site unspecified 1998  . 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 Diaphragmatic hernia without mention of obstruction or gangrene 1982   Hiatal hernia  . Diplopia 2006  . Disturbance of skin sensation 2003  . Diverticulosis of colon (without mention of hemorrhage) 1997  . Dizziness and giddiness 2001  . Dysphagia, unspecified(787.20) 1999 and  . Esophagitis, unspecified 1997  . GERD (gastroesophageal reflux disease) 2003  . Hypertension 2003  . Impotence of organic origin 1999  . Lack of coordination September 07, 2007  . Lumbago 2003  . Migraine with aura, without mention of intractable migraine without mention of status migrainosus 1997   . Myalgia and myositis, unspecified 1999  . Other and unspecified hyperlipidemia 2003  . Other disorders of vitreous 2003  . Other malaise and fatigue 2003  . Other seborrheic keratosis 2013  . Other seborrheic keratosis 04/13/09  . Pain in joint, site unspecified 04/13/12  . Personal history of fall 04/15/11  . Restless legs syndrome (RLS) 1997  . 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 Unspecified hearing loss 2003  . Unspecified tinnitus 04/15/11    Family History  Problem Relation Age of Onset  . Heart disease Father   . Diabetes Neg Hx   . Colon cancer Neg Hx   . Colon polyps Neg Hx   . Esophageal cancer Neg Hx   . Kidney disease Neg Hx   . Gallbladder disease Neg Hx     SOCIAL HISTORY: Social History   Tobacco Use  . Smoking status: Never Smoker  . Smokeless tobacco: Former Systems developer    Types: Chew  Substance Use Topics  . Alcohol use: Yes    Alcohol/week: 0.0 standard drinks    Comment: Occas. Wine     Allergies  Allergen Reactions  . Aciphex [Rabeprazole Sodium]   . Effexor [Venlafaxine Hcl]   . Serzone [Nefazodone]   . Vivactil [Protriptyline Hcl]     Current Outpatient Medications  Medication Sig Dispense Refill  . amLODipine (NORVASC) 5 MG tablet TAKE 1 TABLET BY MOUTH EVERY DAY 90 tablet 1  . celecoxib (  CELEBREX) 200 MG capsule TAKE 1 CAPSULE BY MOUTH EVERY DAY 90 capsule 0  . clotrimazole-betamethasone (LOTRISONE) cream Apply 1 application topically daily. To groin rash 30 g 3  . HYDROcodone-acetaminophen (NORCO/VICODIN) 5-325 MG tablet Take 1/2 tablet by mouth once daily as needed for pain 30 tablet 0  . losartan (COZAAR) 100 MG tablet Take 1 tablet (100 mg total) by mouth daily. 90 tablet 3  . metoprolol succinate (TOPROL-XL) 25 MG 24 hr tablet TAKE 1 TABLET BY MOUTH EVERY DAY 05/21/18 90 tablet 1  . mometasone (NASONEX) 50 MCG/ACT nasal spray Place 2 sprays into the nose daily.     Marland Kitchen nystatin (MYCOSTATIN) 100000 UNIT/ML suspension  SWISH AND SPIT WITH 5MLS BY MOUTH 4 TIMES DAILY 60 mL 0  . oxybutynin (DITROPAN) 5 MG tablet TAKE 1 TABLET BY MOUTH UP TO 3 TIMES DAILY TO HELP BLADDER CONTROL 270 tablet 1  . pantoprazole (PROTONIX) 40 MG tablet TAKE 1 TABLET BY MOUTH EVERY DAY 90 tablet 2  . sildenafil (REVATIO) 20 MG tablet Take 2-5 tablets prior to intercourse 90 tablet 5   No current facility-administered medications for this visit.     REVIEW OF SYSTEMS:  [X]  denotes positive finding, [ ]  denotes negative finding Cardiac  Comments:  Chest pain or chest pressure:    Shortness of breath upon exertion:    Short of breath when lying flat:    Irregular heart rhythm:        Vascular    Pain in calf, thigh, or hip brought on by ambulation:    Pain in feet at night that wakes you up from your sleep:     Blood clot in your veins:    Leg swelling:  x       Pulmonary    Oxygen at home:    Productive cough:     Wheezing:         Neurologic    Sudden weakness in arms or legs:     Sudden numbness in arms or legs:     Sudden onset of difficulty speaking or slurred speech:    Temporary loss of vision in one eye:     Problems with dizziness:         Gastrointestinal    Blood in stool:     Vomited blood:         Genitourinary    Burning when urinating:     Blood in urine:        Psychiatric    Major depression:         Hematologic    Bleeding problems:    Problems with blood clotting too easily:        Skin    Rashes or ulcers:        Constitutional    Fever or chills:     PHYSICAL EXAM:   Vitals:   01/04/19 0933  BP: (!) 157/84  Pulse: 66  Resp: 20  Temp: 97.6 F (36.4 C)  TempSrc: Temporal  SpO2: 97%  Weight: 163 lb (73.9 kg)  Height: 5\' 7"  (1.702 m)    GENERAL: The patient is a well-nourished male, in no acute distress. The vital signs are documented above. CARDIAC: There is a regular rate and rhythm.  VASCULAR: I do not detect carotid bruits. He has some mild left lower extremity  swelling. Both feet are warm and well-perfused. PULMONARY: There is good air exchange bilaterally without wheezing or rales. ABDOMEN: Soft and non-tender with normal pitched  bowel sounds.  I do not palpate an aneurysm. MUSCULOSKELETAL: There are no major deformities or cyanosis. NEUROLOGIC: No focal weakness or paresthesias are detected. SKIN: There are no ulcers or rashes noted. PSYCHIATRIC: The patient has a normal affect.  DATA:    DUPLEX ABDOMINAL AORTA: Duplex of his abdominal aorta does not show evidence of an aneurysm.  The maximum diameter of the aorta is 2.4 cm.  The right common iliac artery measures 1.2 cm in maximum diameter.  The left common iliac artery measures 1.2 cm in maximum diameter.  MEDICAL ISSUES:   AORTIC DISSECTION: This patient was found to have a small infrarenal aortic dissection 2 years ago on a CT scan.  Given the small risk of aneurysmal degeneration I recommended a duplex scan.  This shows no evidence of aneurysmal degeneration.  I think we should continue to follow this however I have ordered a follow-up duplex scan in 3 years.  If things still look good then I have explained that we could likely extend his follow-up out to 5 years.  He is not a smoker.  We have discussed the importance of good blood pressure control.  I will see him back in 3 years.  He knows to call sooner if he has problems.  Deitra Mayo [facility] [phone]

## 2019-01-11 ENCOUNTER — Ambulatory Visit: Payer: Medicare Other | Admitting: Vascular Surgery

## 2019-01-11 ENCOUNTER — Other Ambulatory Visit (HOSPITAL_COMMUNITY): Payer: Medicare Other

## 2019-01-23 ENCOUNTER — Other Ambulatory Visit: Payer: Self-pay

## 2019-01-23 ENCOUNTER — Ambulatory Visit (INDEPENDENT_AMBULATORY_CARE_PROVIDER_SITE_OTHER): Payer: Medicare Other | Admitting: *Deleted

## 2019-01-23 DIAGNOSIS — Z23 Encounter for immunization: Secondary | ICD-10-CM | POA: Diagnosis not present

## 2019-03-22 ENCOUNTER — Other Ambulatory Visit: Payer: Self-pay | Admitting: Internal Medicine

## 2019-03-22 DIAGNOSIS — I10 Essential (primary) hypertension: Secondary | ICD-10-CM

## 2019-03-30 ENCOUNTER — Other Ambulatory Visit: Payer: Self-pay | Admitting: Internal Medicine

## 2019-03-30 MED ORDER — HYDROCODONE-ACETAMINOPHEN 5-325 MG PO TABS: ORAL_TABLET | ORAL | 0 refills | Status: DC

## 2019-03-30 NOTE — Telephone Encounter (Signed)
Last filled on 11/17/2018 in [facility]. RX request sent to [employer], DO to review [place] Database and approve if necessary

## 2019-04-07 ENCOUNTER — Encounter: Payer: Self-pay | Admitting: Family

## 2019-04-10 ENCOUNTER — Ambulatory Visit: Payer: Self-pay

## 2019-04-10 ENCOUNTER — Encounter: Payer: Self-pay | Admitting: Family

## 2019-04-17 ENCOUNTER — Encounter: Payer: Self-pay | Admitting: Internal Medicine

## 2019-04-17 ENCOUNTER — Other Ambulatory Visit: Payer: Self-pay

## 2019-04-17 ENCOUNTER — Ambulatory Visit (INDEPENDENT_AMBULATORY_CARE_PROVIDER_SITE_OTHER): Payer: Medicare Other | Admitting: Internal Medicine

## 2019-04-17 VITALS — BP 140/80 | HR 113 | Temp 97.9°F | Resp 20 | Ht 67.0 in | Wt 168.2 lb

## 2019-04-17 DIAGNOSIS — K2100 Gastro-esophageal reflux disease with esophagitis, without bleeding: Secondary | ICD-10-CM

## 2019-04-17 DIAGNOSIS — B3781 Candidal esophagitis: Secondary | ICD-10-CM

## 2019-04-17 DIAGNOSIS — I10 Essential (primary) hypertension: Secondary | ICD-10-CM

## 2019-04-17 DIAGNOSIS — I7102 Dissection of abdominal aorta: Secondary | ICD-10-CM

## 2019-04-17 DIAGNOSIS — M159 Polyosteoarthritis, unspecified: Secondary | ICD-10-CM

## 2019-04-17 DIAGNOSIS — G119 Hereditary ataxia, unspecified: Secondary | ICD-10-CM | POA: Diagnosis not present

## 2019-04-17 DIAGNOSIS — R1312 Dysphagia, oropharyngeal phase: Secondary | ICD-10-CM

## 2019-04-17 MED ORDER — FLUCONAZOLE 200 MG PO TABS: 200.0000 mg | ORAL_TABLET | Freq: Every day | ORAL | 0 refills | Status: DC

## 2019-04-17 MED ORDER — ACETAMINOPHEN ER 650 MG PO TBCR: 650.0000 mg | EXTENDED_RELEASE_TABLET | Freq: Two times a day (BID) | ORAL | 3 refills | Status: DC

## 2019-04-17 NOTE — Progress Notes (Signed)
Location:  [place] [facility]  Provider: DR. Lerna  Goals of Care:  Advanced Directives 01/04/2019  Does Patient Have a Medical Advance Directive? Yes  Type of Paramedic of [place];Living will  Does patient want to make changes to medical advance directive? No - Patient declined  Copy of [place] in Chart? Yes - validated most recent copy scanned in chart (See row information)  Would patient like information on creating a medical advance directive? -  Pre-existing out of facility DNR order (yellow form or pink MOST form) -     No chief complaint on file.   HPI: Patient is a 78 y.o. male seen today for medical management of chronic diseases.    He has been doing well. He has questions about medications today.   Complains about heartburn. He takes his protonix daily, but does not think it helps. He will take a tums when the heartburn ocurrs. Heartburn ocurrs at different times of the day. Has not changed his diet to recognize any trigger foods.   Still taking nystatin, but does not think it is effective. He checks his mouth a few times a day and thinks it looks the same. He also at times has difficulty swallowing and a metallic taste. Interested in trying another medication, if possible.   He thinks celebrex causes mouth sores. He researched the medication and saw this as a side effect. He stopped taking the celebrex for a few days and the mouth sores went away. He restarted celebrex and the mouth sores reappeared. Currently not taking celebrex. Joint stiffness and arthritic pain have increased since stopping medicaiton.   Eats three meals a day. Not following a low sodium diet. He sometimes has trouble swallowing.  Tries to eat softer foods to help.   He fell the other day while entering a building as someone was leaving and bruised left elbow. He claims it is sore. He has placed ice on it to help with swelling. Still uses rollator to  ambulate. No other injuries reported.   Takes his ditropan daily. Hardly takes it three times a day due to risk of constipation. Denies urinary issues at this time.      Past Medical History:  Diagnosis Date  . Abnormality of gait September 07 2007  . Allergic rhinitis, cause unspecified 2007  . Arthritis   . Ataxia   . Cervicalgia February 26, 2006  . Chest pain   . Cramp of limb 10/21/11  . Degeneration of intervertebral disc, site unspecified 1998  . 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 Diaphragmatic hernia without mention of obstruction or gangrene 1982   Hiatal hernia  . Diplopia 2006  . Disturbance of skin sensation 2003  . Diverticulosis of colon (without mention of hemorrhage) 1997  . Dizziness and giddiness 2001  . Dysphagia, unspecified(787.20) 1999 and  . Esophagitis, unspecified 1997  . GERD (gastroesophageal reflux disease) 2003  . Hypertension 2003  . Impotence of organic origin 1999  . Lack of coordination September 07, 2007  . Lumbago 2003  . Migraine with aura, without mention of intractable migraine without mention of status migrainosus 1997  . Myalgia and myositis, unspecified 1999  . Other and unspecified hyperlipidemia 2003  . Other disorders of vitreous 2003  . Other malaise and fatigue 2003  . Other seborrheic keratosis 2013  . Other seborrheic keratosis 04/13/09  . Pain in joint, site unspecified 04/13/12  . Personal history of fall  04/15/11  . Restless legs syndrome (RLS) 1997  . 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 Unspecified hearing loss 2003  . Unspecified tinnitus 04/15/11    Past Surgical History:  Procedure Laterality Date  . APPENDECTOMY  Age 33  . boil removed     Dr. Donne Hazel  . C3-4 anterior cervical surgery  1999   Dr. Hal Neer  . CARDIAC CATHETERIZATION  2001   Dr. Glade Lloyd  . COLONOSCOPY  07/20/07   Dr. Sharlett Iles  . EYE SURGERY Bilateral 2015   cataracts  . LAMINOTOMY /  EXCISION DISK POSTERIOR CERVICAL SPINE     Spinal Disk (?4-5)    Allergies  Allergen Reactions  . Aciphex [Rabeprazole Sodium]   . Effexor [Venlafaxine Hcl]   . Serzone [Nefazodone]   . Vivactil [Protriptyline Hcl]     Outpatient Encounter Medications as of 04/17/2019  Medication Sig  . amLODipine (NORVASC) 5 MG tablet TAKE 1 TABLET BY MOUTH EVERY DAY  . celecoxib (CELEBREX) 200 MG capsule TAKE 1 CAPSULE BY MOUTH EVERY DAY  . clotrimazole-betamethasone (LOTRISONE) cream Apply 1 application topically daily. To groin rash  . HYDROcodone-acetaminophen (NORCO/VICODIN) 5-325 MG tablet Take 1/2 tablet by mouth once daily as needed for pain  . losartan (COZAAR) 100 MG tablet Take 1 tablet (100 mg total) by mouth daily.  . metoprolol succinate (TOPROL-XL) 25 MG 24 hr tablet TAKE 1 TABLET BY MOUTH EVERY DAY 05/21/18  . mometasone (NASONEX) 50 MCG/ACT nasal spray Place 2 sprays into the nose daily.   Marland Kitchen nystatin (MYCOSTATIN) 100000 UNIT/ML suspension SWISH AND SPIT WITH 5MLS BY MOUTH 4 TIMES DAILY  . oxybutynin (DITROPAN) 5 MG tablet TAKE 1 TABLET BY MOUTH UP TO 3 TIMES DAILY TO HELP BLADDER CONTROL  . pantoprazole (PROTONIX) 40 MG tablet TAKE 1 TABLET BY MOUTH EVERY DAY  . sildenafil (REVATIO) 20 MG tablet Take 2-5 tablets prior to intercourse   No facility-administered encounter medications on file as of 04/17/2019.     Review of Systems:  Review of Systems  Constitutional: Negative for activity change, appetite change and fatigue.  HENT: Positive for mouth sores and trouble swallowing.        Oral thrush  Eyes: Negative for photophobia and visual disturbance.  Respiratory: Negative for cough, shortness of breath and wheezing.   Cardiovascular: Negative for chest pain and palpitations.  Gastrointestinal: Negative for abdominal pain, constipation, diarrhea and nausea.  Genitourinary: Positive for frequency. Negative for dysuria and hematuria.  Musculoskeletal: Positive for arthralgias.        Joint stiffness  Neurological: Negative for dizziness, syncope and headaches.  Psychiatric/Behavioral: Negative for dysphoric mood and sleep disturbance. The patient is not nervous/anxious.     Health Maintenance  Topic Date Due  . TETANUS/TDAP  06/11/2021  . INFLUENZA VACCINE  Completed  . PNA vac Low Risk Adult  Completed    Physical Exam: There were no vitals filed for this visit. There is no height or weight on file to calculate BMI. Physical Exam HENT:     Mouth/Throat:     Mouth: No oral lesions.     Tongue: No lesions.   Cardiovascular:     Comments: Apical pulse 77    Labs reviewed: Basic Metabolic Panel: No results for input(s): NA, K, CL, CO2, GLUCOSE, BUN, CREATININE, CALCIUM, MG, PHOS, TSH in the last 8760 hours. Liver Function Tests: No results for input(s): AST, ALT, ALKPHOS, BILITOT, PROT, ALBUMIN in the last 8760 hours. No results for input(s): LIPASE, AMYLASE in  the last 8760 hours. No results for input(s): AMMONIA in the last 8760 hours. CBC: No results for input(s): WBC, NEUTROABS, HGB, HCT, MCV, PLT in the last 8760 hours. Lipid Panel: Recent Labs    09/30/18 0843  CHOL 197  HDL 54  LDLCALC 119*  TRIG 127  CHOLHDL 3.6   No results found for: HGBA1C  Procedures since last visit: No results found.  Assessment/Plan 1. Esophageal candidiasis (HCC) - suspect thrush has progressed in severity - can continue nystatin rinses 4 times a day PRN - Diflucan 200 mg daily for 21 days - please contact PCP if symptoms progress  2. 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 Oropharyngeal dysphagia - stable at this time - suspect oral thrush is making swallowing difficult at times  - continue soft diet  5. Dissection of abdominal aorta (HCC) - followed by Dr. Scot Dock - follow up duplex scan needed in 3 years  6. Generalized osteoarthritis - celebrex gives him mouth sores, he has trialed this side effect on himself - discontinue celebrex - start tylenol 650 mg two times daily for joint pain and stiffness - continue to use rollator when ambulating  Labs/tests ordered:  None Next appt:  6 week follow up

## 2019-04-17 NOTE — Patient Instructions (Signed)
We will treat your thrush (which I think has moved down into your esophagus) with diflucan since the nystatin has not worked.  I think this is also causing your feelings of indigestion.  If you are not getting better after you finish the diflucan, we may need to change you to nexium as you mentioned in place of protonix.  For now, continue the protonix.  For you arthritis pain, stay off celebrex because of your mouth sores it gave you.  Start tylenol arthritis twice a day routinely.  Use the hydrocodone only for severe pain and your headaches that come from the neck arthritis.

## 2019-05-01 IMAGING — US US EXTREM LOW VENOUS*L*
1 series · 13 of 24 positions shown · non-contrast
Comparison: None.

CLINICAL DATA: Localized edema and pain of the left lower leg.



[Series 1: us extrem low venous*left* · 0.08mm/px · 13 of 45 slices shown]
[im 1/45]
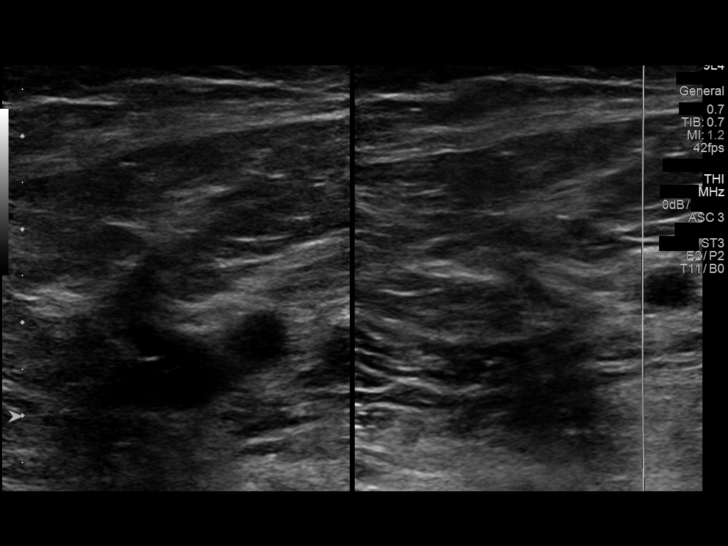
[im 4/45]
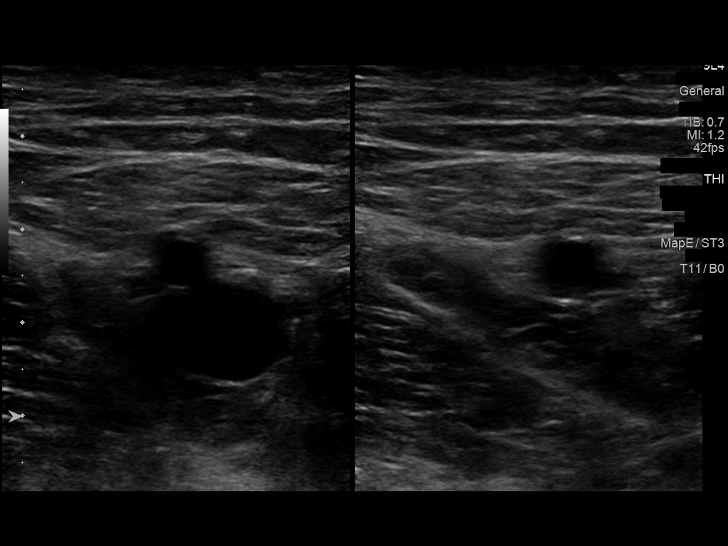
[im 8/45]
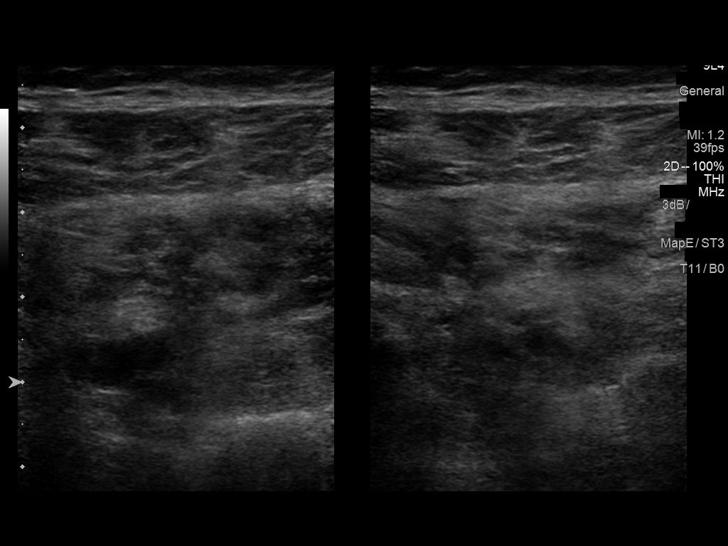
[im 12/45]
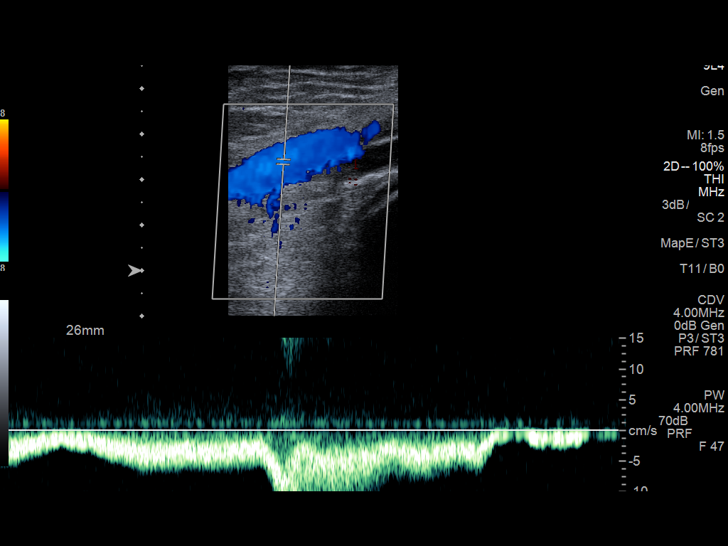
[im 16/45]
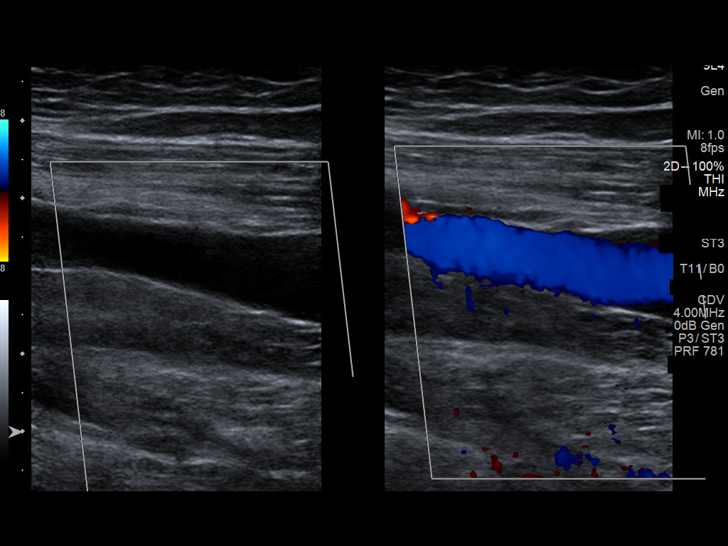
[im 20/45]
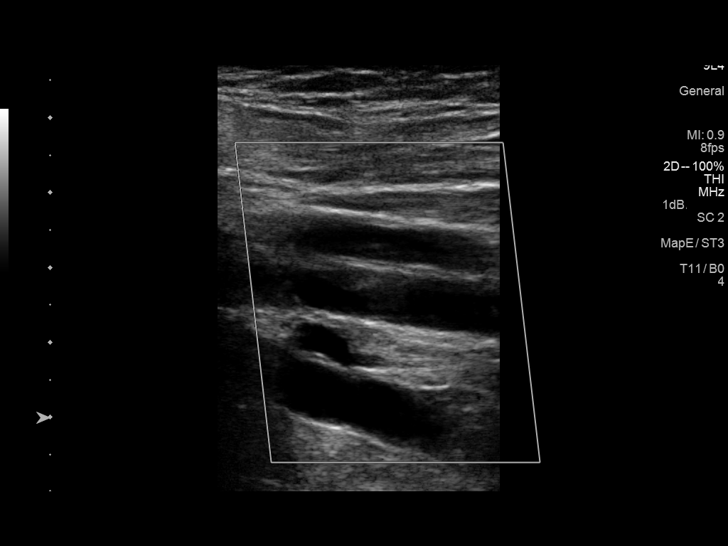
[im 23/45]
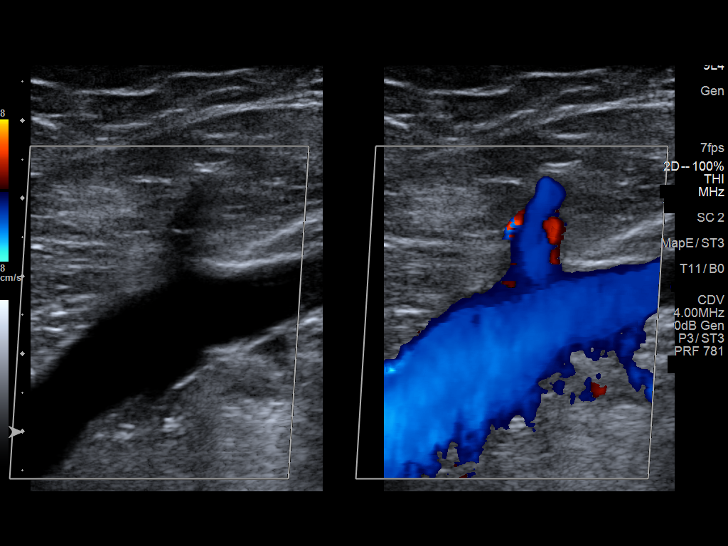
[im 25/45]
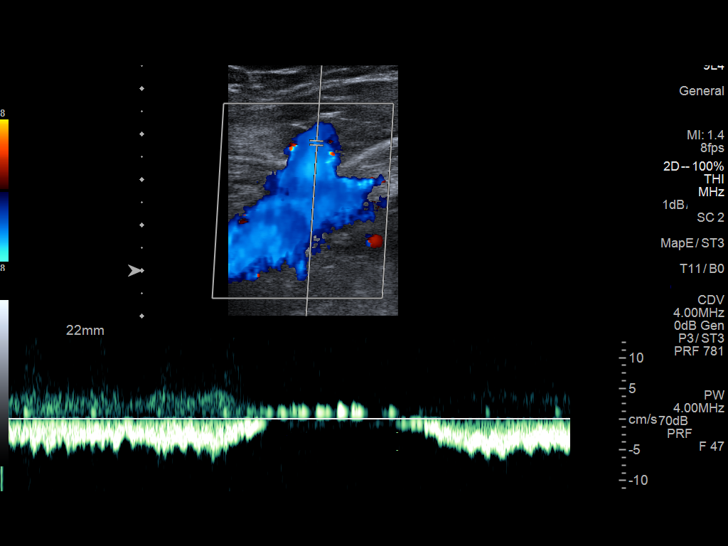
[im 29/45]
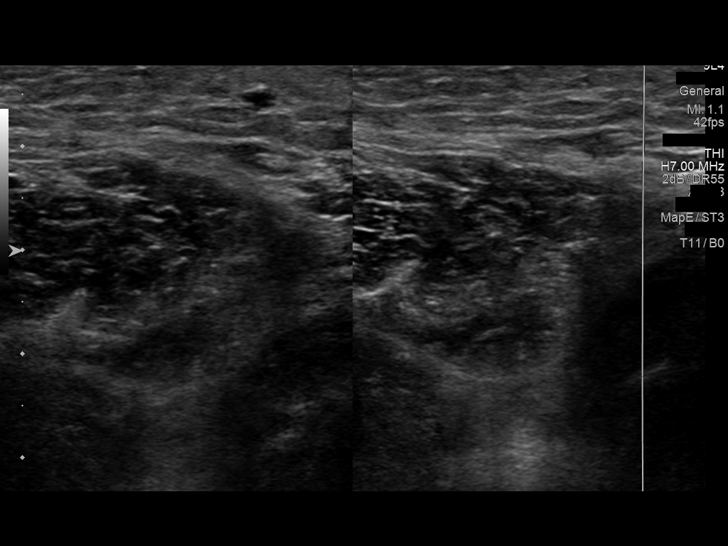
[im 33/45]
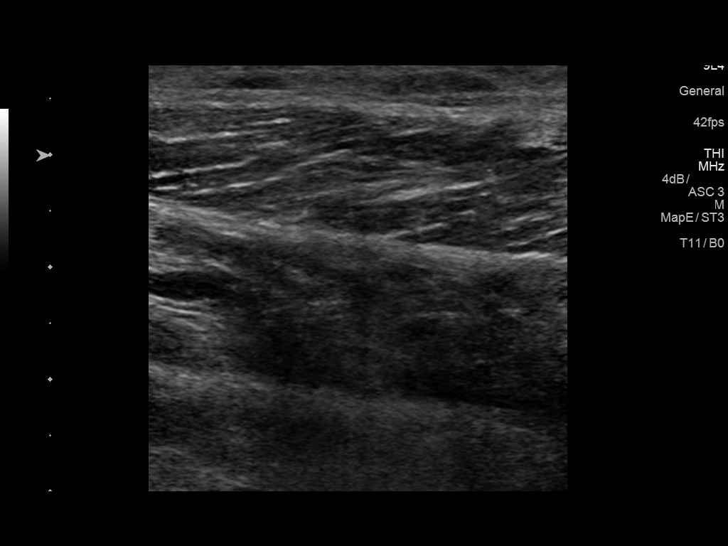
[im 37/45]
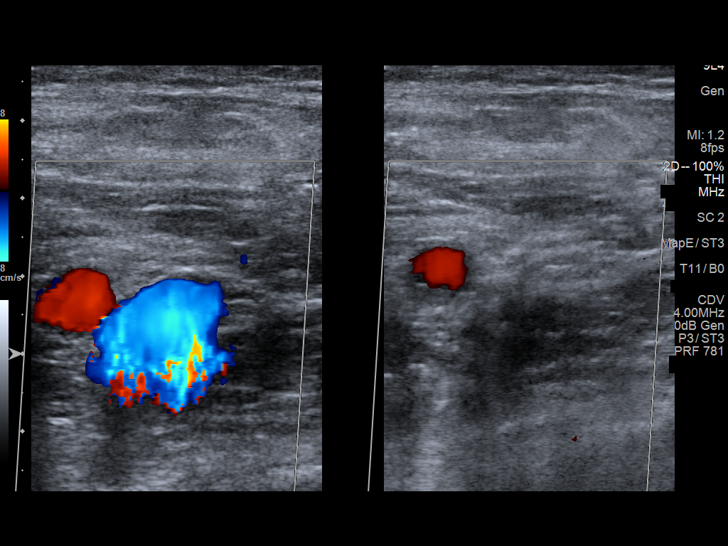
[im 41/45]
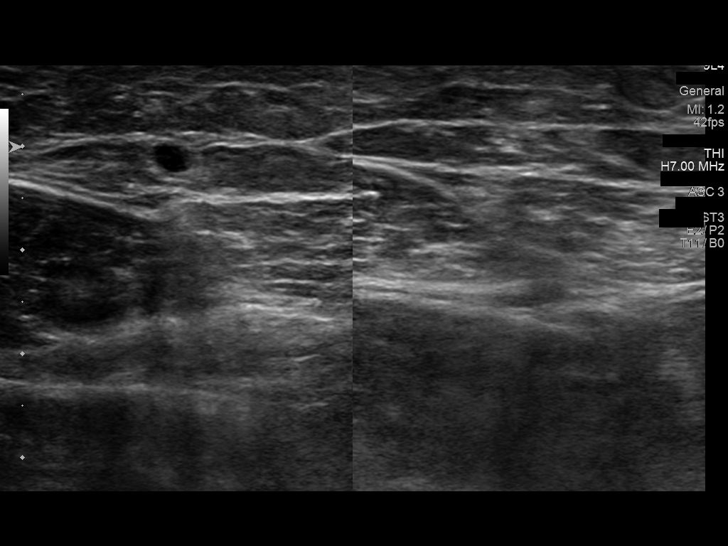
[im 45/45]
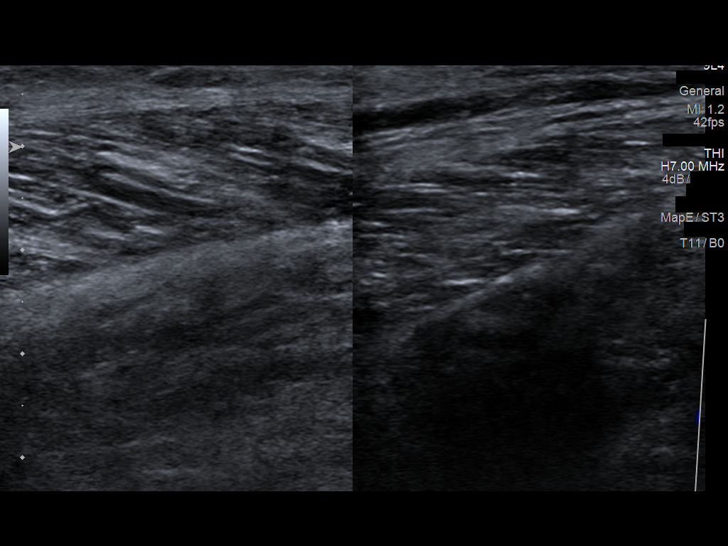

[13 of 24 positions shown; findings below may reference images not displayed]

FINDINGS: Contralateral Common Femoral Vein: Respiratory phasicity is normal
and symmetric with the symptomatic side. No evidence of thrombus.
Normal compressibility.

Common Femoral Vein: No evidence of thrombus. Normal
compressibility, respiratory phasicity and response to augmentation.

Saphenofemoral Junction: No evidence of thrombus. Normal
compressibility and flow on color Doppler imaging.

Profunda Femoral Vein: No evidence of thrombus. Normal
compressibility and flow on color Doppler imaging.

Femoral Vein: No evidence of thrombus. Normal compressibility,
respiratory phasicity and response to augmentation.

Popliteal Vein: No evidence of thrombus. Normal compressibility,
respiratory phasicity and response to augmentation.

Calf Veins: No evidence of thrombus. Normal compressibility and flow
on color Doppler imaging.

Superficial Great Saphenous Vein: No evidence of thrombus. Normal
compressibility.

Venous Reflux:  None.

Other Findings: There was felt to be potentially be subtle thrombus
in a superficial vein of the mid calf. This is not entirely
convincing on imaging but could reflect subtle superficial
thrombophlebitis. No abnormal fluid collection.
IMPRESSION: No evidence of left lower extremity deep venous thrombosis.
Potential subtle superficial thrombophlebitis of a superficial vein
in the mid calf.

## 2019-05-17 ENCOUNTER — Other Ambulatory Visit: Payer: Self-pay | Admitting: Internal Medicine

## 2019-05-17 DIAGNOSIS — I10 Essential (primary) hypertension: Secondary | ICD-10-CM

## 2019-05-19 ENCOUNTER — Other Ambulatory Visit: Payer: Self-pay | Admitting: Internal Medicine

## 2019-06-01 ENCOUNTER — Other Ambulatory Visit: Payer: Self-pay | Admitting: Internal Medicine

## 2019-06-01 DIAGNOSIS — K219 Gastro-esophageal reflux disease without esophagitis: Secondary | ICD-10-CM

## 2019-06-08 ENCOUNTER — Other Ambulatory Visit: Payer: Self-pay

## 2019-06-08 ENCOUNTER — Ambulatory Visit (INDEPENDENT_AMBULATORY_CARE_PROVIDER_SITE_OTHER): Payer: Medicare Other | Admitting: Internal Medicine

## 2019-06-08 ENCOUNTER — Encounter: Payer: Self-pay | Admitting: Internal Medicine

## 2019-06-08 VITALS — BP 122/70 | HR 68 | Temp 97.7°F | Ht 67.0 in | Wt 176.0 lb

## 2019-06-08 DIAGNOSIS — B3781 Candidal esophagitis: Secondary | ICD-10-CM

## 2019-06-08 DIAGNOSIS — E7800 Pure hypercholesterolemia, unspecified: Secondary | ICD-10-CM

## 2019-06-08 DIAGNOSIS — G119 Hereditary ataxia, unspecified: Secondary | ICD-10-CM | POA: Diagnosis not present

## 2019-06-08 DIAGNOSIS — H6121 Impacted cerumen, right ear: Secondary | ICD-10-CM

## 2019-06-08 DIAGNOSIS — M545 Low back pain, unspecified: Secondary | ICD-10-CM

## 2019-06-08 DIAGNOSIS — K219 Gastro-esophageal reflux disease without esophagitis: Secondary | ICD-10-CM | POA: Diagnosis not present

## 2019-06-08 DIAGNOSIS — M159 Polyosteoarthritis, unspecified: Secondary | ICD-10-CM

## 2019-06-08 DIAGNOSIS — G8929 Other chronic pain: Secondary | ICD-10-CM

## 2019-06-08 DIAGNOSIS — B370 Candidal stomatitis: Secondary | ICD-10-CM

## 2019-06-08 MED ORDER — PANTOPRAZOLE SODIUM 40 MG PO TBEC: 40.0000 mg | DELAYED_RELEASE_TABLET | Freq: Two times a day (BID) | ORAL | 1 refills | Status: DC

## 2019-06-08 MED ORDER — FLUCONAZOLE 200 MG PO TABS: 200.0000 mg | ORAL_TABLET | Freq: Every day | ORAL | 0 refills | Status: DC

## 2019-06-08 MED ORDER — ZIKS ARTHRITIS PAIN RELIEF 0.025-1-12 % EX CREA: 1.0000 "application " | TOPICAL_CREAM | Freq: Every day | CUTANEOUS | 3 refills | Status: DC | PRN

## 2019-06-08 MED ORDER — HYDROCODONE-ACETAMINOPHEN 5-325 MG PO TABS: ORAL_TABLET | ORAL | 0 refills | Status: DC

## 2019-06-08 NOTE — Progress Notes (Signed)
Location:  [facility]  Provider: Dr. Hollace Kinnier   Goals of Care:  Advanced Directives 04/17/2019  Does Patient Have a Medical Advance Directive? Yes  Type of Advance Directive Out of facility DNR (pink MOST or yellow form)  Does patient want to make changes to medical advance directive? No - Patient declined  Copy of [place] in Chart? -  Would patient like information on creating a medical advance directive? -  Pre-existing out of facility DNR order (yellow form or pink MOST form) Yellow form placed in chart (order not valid for inpatient use)     Chief Complaint  Patient presents with  . Medical Management of Chronic Issues    6 week foolow up on reflux     HPI: Patient is a 79 y.o. male seen today for medical management of chronic diseases.    Caregiver present for visit.   He states his esophageal candidiasis improved when he was taking the diflucan. He was only on this medication for a brief period of time. Claims his swallowing issues are the same. Has a metallic taste in his mouth most of the time. Requesting another round of Diflucan therapy.   Still having heartburn issues while on protonix. Heartburn occurs mainly at night. Asking to try a new medication or increase the dose of the protonix.   Having headaces about 3-4 times a week. Headache has been worse since he developed a right ear ache this past Monday. Has been taking hydrocodone for his headache about three times a week.   Continues to suffer from generalized osteoarthritis. Knees pain is the most troubling. Since last visit he has discontinued his celebrex and started taking 650 mg tylenol BID. He believes it helps with his pain.   Left auxilla with a small pea-sized boil. He has had this boil for a couple years. In the past week it has become larger. He denies any pain or heat to the area. He has pressed on it and a small amount of pus was expressed. Wondering if is needs to be removed.    No recent falls or injures. Still using rollator to ambulate.   Past Medical History:  Diagnosis Date  . Abnormality of gait September 07 2007  . Allergic rhinitis, cause unspecified 2007  . Arthritis   . Ataxia   . Cervicalgia February 26, 2006  . Chest pain   . Cramp of limb 10/21/11  . Degeneration of intervertebral disc, site unspecified 1998  . 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 Diaphragmatic hernia without mention of obstruction or gangrene 1982   Hiatal hernia  . Diplopia 2006  . Disturbance of skin sensation 2003  . Diverticulosis of colon (without mention of hemorrhage) 1997  . Dizziness and giddiness 2001  . Dysphagia, unspecified(787.20) 1999 and  . Esophagitis, unspecified 1997  . GERD (gastroesophageal reflux disease) 2003  . Hypertension 2003  . Impotence of organic origin 1999  . Lack of coordination September 07, 2007  . Lumbago 2003  . Migraine with aura, without mention of intractable migraine without mention of status migrainosus 1997  . Myalgia and myositis, unspecified 1999  . Other and unspecified hyperlipidemia 2003  . Other disorders of vitreous 2003  . Other malaise and fatigue 2003  . Other seborrheic keratosis 2013  . Other seborrheic keratosis 04/13/09  . Pain in joint, site unspecified 04/13/12  . Personal history of fall 04/15/11  . Restless legs syndrome (RLS) 1997  .  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 Unspecified hearing loss 2003  . Unspecified tinnitus 04/15/11    Past Surgical History:  Procedure Laterality Date  . APPENDECTOMY  Age 29  . boil removed     Dr. Donne Hazel  . C3-4 anterior cervical surgery  1999   Dr. Hal Neer  . CARDIAC CATHETERIZATION  2001   Dr. Glade Lloyd  . COLONOSCOPY  07/20/07   Dr. Sharlett Iles  . EYE SURGERY Bilateral 2015   cataracts  . LAMINOTOMY / EXCISION DISK POSTERIOR CERVICAL SPINE     Spinal Disk (?4-5)    Allergies  Allergen Reactions  . Aciphex  [Rabeprazole Sodium]   . Effexor [Venlafaxine Hcl]   . Serzone [Nefazodone]   . Vivactil [Protriptyline Hcl]     Outpatient Encounter Medications as of 06/08/2019  Medication Sig  . acetaminophen (TYLENOL 8 HOUR ARTHRITIS PAIN) 650 MG CR tablet Take 1 tablet (650 mg total) by mouth 2 (two) times daily.  Marland Kitchen amLODipine (NORVASC) 5 MG tablet TAKE 1 TABLET BY MOUTH EVERY DAY  . fluconazole (DIFLUCAN) 200 MG tablet Take 1 tablet (200 mg total) by mouth daily.  Marland Kitchen HYDROcodone-acetaminophen (NORCO/VICODIN) 5-325 MG tablet Take 1/2 tablet by mouth once daily as needed for pain  . losartan (COZAAR) 100 MG tablet Take 1 tablet (100 mg total) by mouth daily.  . metoprolol succinate (TOPROL-XL) 25 MG 24 hr tablet TAKE 1 TABLET BY MOUTH EVERY DAY  . mometasone (NASONEX) 50 MCG/ACT nasal spray Place 2 sprays into the nose daily.   Marland Kitchen nystatin (MYCOSTATIN) 100000 UNIT/ML suspension SWISH AND SPIT WITH 5MLS BY MOUTH 4 TIMES DAILY  . oxybutynin (DITROPAN) 5 MG tablet TAKE 1 TABLET BY MOUTH UP TO 3 TIMES DAILY TO HELP BLADDER CONTROL  . pantoprazole (PROTONIX) 40 MG tablet TAKE 1 TABLET BY MOUTH EVERY DAY   No facility-administered encounter medications on file as of 06/08/2019.    Review of Systems:  Review of Systems  Health Maintenance  Topic Date Due  . TETANUS/TDAP  06/11/2021  . INFLUENZA VACCINE  Completed  . PNA vac Low Risk Adult  Completed    Physical Exam: Vitals:   06/08/19 1332  BP: 122/70  Pulse: 68  Temp: 97.7 F (36.5 C)  TempSrc: Oral  SpO2: 98%  Weight: 176 lb (79.8 kg)  Height: 5\' 7"  (1.702 m)   Body mass index is 27.57 kg/m. Physical Exam Vitals reviewed.  Constitutional:      Appearance: Normal appearance. He is normal weight.  HENT:     Head: Normocephalic.     Right Ear: There is impacted cerumen.     Left Ear: There is no impacted cerumen.     Mouth/Throat:     Mouth: Mucous membranes are dry. Oral lesions present.     Tongue: Lesions present.     Comments:  White patches noted on back of tongue.  Cardiovascular:     Rate and Rhythm: Normal rate and regular rhythm.     Pulses: Normal pulses.     Heart sounds: Normal heart sounds. No murmur.  Pulmonary:     Effort: Pulmonary effort is normal. No respiratory distress.     Breath sounds: Normal breath sounds. No wheezing.  Abdominal:     General: Abdomen is flat. Bowel sounds are normal.     Palpations: Abdomen is soft.  Musculoskeletal:     Right lower leg: No edema.     Left lower leg: No edema.  Skin:    General:  Skin is warm and dry.     Capillary Refill: Capillary refill takes less than 2 seconds.       Neurological:     Mental Status: He is alert.     Motor: Weakness present.     Coordination: Coordination abnormal.     Gait: Gait abnormal.  Psychiatric:        Mood and Affect: Mood normal.        Behavior: Behavior normal.        Thought Content: Thought content normal.        Judgment: Judgment normal.     Labs reviewed: Basic Metabolic Panel: No results for input(s): NA, K, CL, CO2, GLUCOSE, BUN, CREATININE, CALCIUM, MG, PHOS, TSH in the last 8760 hours. Liver Function Tests: No results for input(s): AST, ALT, ALKPHOS, BILITOT, PROT, ALBUMIN in the last 8760 hours. No results for input(s): LIPASE, AMYLASE in the last 8760 hours. No results for input(s): AMMONIA in the last 8760 hours. CBC: No results for input(s): WBC, NEUTROABS, HGB, HCT, MCV, PLT in the last 8760 hours. Lipid Panel: Recent Labs    09/30/18 0843  CHOL 197  HDL 54  LDLCALC 119*  TRIG 127  CHOLHDL 3.6   No results found for: HGBA1C  Procedures since last visit: No results found.  Assessment/Plan 1. Chronic midline low back pain without sciatica - stable at this time - continue tylenol 1000 mg BID - continue hydrocodone 5/325, 1/2 tablet for severe pain PRN  2. 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 Esophageal candidiasis ([place]) - Diflucan trial in November 2020 was helpful with oral candida - has tried nystatin without success int he past - will try another trial of diflucan for 21 more days  4. Gastroesophageal reflux disease without esophagitis - he is still have reflux issues while taking protonix daily - will increase protonix to 40 mg BID - if protonix is unsuccessful, may try OTC nexium  - avoid trigger foods - avoid eating late at night before bedtime  5. Generalized osteoarthritis - reports tylenol 1000 mg BID helps with pain - cannot take Cox-2 inhibitors due to mouth sores - may try capsaicin cream on knees for pain  6. Oral thrush - Same as above  7. Pure hypercholesterolemia  - recommend low fat diet and avoiding fried foods - lipid panel- future  8. Hearing loss of right ear due to cerumen impaction - flush right ear- today - pain diminished after right ear was flushed - recommend Debrox drops for 1 week when ear feels congested     Labs/tests ordered:  Lipid panel- future Next appt:  4 month follow up

## 2019-06-08 NOTE — Patient Instructions (Signed)
Increase protonix to 40mg  by mouth twice daily before meals for indigestion. Resume diflucan for 21 more days for thrush. If you are still having problems, I will send you to GI and switch you to nexium. You may use capsaicin cream for your knee arthritis. We flushed your right ear that was full of wax.  You may use Debrox drops (which are peroxide and oil) in your ear for a week at a time if you feel like the ear is getting plugged up again.

## 2019-06-11 ENCOUNTER — Ambulatory Visit: Payer: Medicare Other | Attending: Internal Medicine

## 2019-06-11 DIAGNOSIS — Z23 Encounter for immunization: Secondary | ICD-10-CM | POA: Insufficient documentation

## 2019-06-11 NOTE — Progress Notes (Signed)
   Covid-19 Vaccination Clinic  Name:  DAVONE DAMBRA    MRN: [id number] DOB: [birth year]  06/11/2019  Mr. Lougheed was observed post Covid-19 immunization for 15 minutes without incidence. He was provided with Vaccine Information Sheet and instruction to access the V-Safe system.   Mr. Panek was instructed to call 911 with any severe reactions post vaccine: Marland Kitchen Difficulty breathing  . Swelling of your face and throat  . A fast heartbeat  . A bad rash all over your body  . Dizziness and weakness    Immunizations Administered    Name Date Dose VIS Date Route   Pfizer COVID-19 Vaccine 06/11/2019 12:14 PM 0.3 mL 05/12/2019 Intramuscular   Manufacturer: [employer], [employer]   Lot: [id number]   [place]: [id number]

## 2019-06-13 ENCOUNTER — Ambulatory Visit: Payer: Medicare Other

## 2019-07-02 ENCOUNTER — Ambulatory Visit: Payer: Medicare Other | Attending: Internal Medicine

## 2019-07-02 DIAGNOSIS — Z23 Encounter for immunization: Secondary | ICD-10-CM | POA: Insufficient documentation

## 2019-07-02 NOTE — Progress Notes (Signed)
   Covid-19 Vaccination Clinic  Name:  Justin [name]    MRN: [id number] DOB: [birth year]  07/02/2019  [name] was observed post Covid-19 immunization for 15 minutes without incidence. He was provided with Vaccine Information Sheet and instruction to access the V-Safe system.   [name] was instructed to call 911 with any severe reactions post vaccine: Marland Kitchen Difficulty breathing  . Swelling of your face and throat  . A fast heartbeat  . A bad rash all over your body  . Dizziness and weakness    Immunizations Administered    Name Date Dose VIS Date Route   Pfizer COVID-19 Vaccine 07/02/2019 11:22 AM 0.3 mL 05/12/2019 Intramuscular   Manufacturer: [place]   Lot: [id number]   [place]: [id number]

## 2019-07-10 ENCOUNTER — Encounter: Payer: Self-pay | Admitting: Internal Medicine

## 2019-09-20 ENCOUNTER — Other Ambulatory Visit: Payer: Self-pay | Admitting: Internal Medicine

## 2019-09-20 DIAGNOSIS — G8929 Other chronic pain: Secondary | ICD-10-CM

## 2019-09-20 NOTE — Telephone Encounter (Signed)
Last filled in epic on 06/08/2019. Contract on file

## 2019-09-21 MED ORDER — HYDROCODONE-ACETAMINOPHEN 5-325 MG PO TABS: ORAL_TABLET | ORAL | 0 refills | Status: DC

## 2019-10-02 ENCOUNTER — Other Ambulatory Visit: Payer: Self-pay | Admitting: *Deleted

## 2019-10-02 DIAGNOSIS — I10 Essential (primary) hypertension: Secondary | ICD-10-CM

## 2019-10-02 MED ORDER — AMLODIPINE BESYLATE 5 MG PO TABS: 5.0000 mg | ORAL_TABLET | Freq: Every day | ORAL | 1 refills | Status: DC

## 2019-10-02 NOTE — Telephone Encounter (Signed)
Requested refill. Faxed to pharmacy.

## 2019-10-04 ENCOUNTER — Other Ambulatory Visit: Payer: Self-pay

## 2019-10-04 ENCOUNTER — Other Ambulatory Visit: Payer: Medicare Other

## 2019-10-04 DIAGNOSIS — E7800 Pure hypercholesterolemia, unspecified: Secondary | ICD-10-CM | POA: Diagnosis not present

## 2019-10-04 DIAGNOSIS — B3781 Candidal esophagitis: Secondary | ICD-10-CM

## 2019-10-05 LAB — CBC WITH DIFFERENTIAL/PLATELET
Absolute Monocytes: 555 cells/uL (ref 200–950)
Basophils Absolute: 30 cells/uL (ref 0–200)
Basophils Relative: 0.4 %
Eosinophils Absolute: 111 cells/uL (ref 15–500)
Eosinophils Relative: 1.5 %
HCT: 44.5 % (ref 38.5–50.0)
Hemoglobin: 15.1 g/dL (ref 13.2–17.1)
Lymphs Abs: 2272 cells/uL (ref 850–3900)
MCH: 30.9 pg (ref 27.0–33.0)
MCHC: 33.9 g/dL (ref 32.0–36.0)
MCV: 91 fL (ref 80.0–100.0)
MPV: 11.1 fL (ref 7.5–12.5)
Monocytes Relative: 7.5 %
Neutro Abs: 4433 cells/uL (ref 1500–7800)
Neutrophils Relative %: 59.9 %
Platelets: 208 10*3/uL (ref 140–400)
RBC: 4.89 10*6/uL (ref 4.20–5.80)
RDW: 12.6 % (ref 11.0–15.0)
Total Lymphocyte: 30.7 %
WBC: 7.4 10*3/uL (ref 3.8–10.8)

## 2019-10-05 LAB — COMPLETE METABOLIC PANEL WITH GFR
AG Ratio: 1.9 (calc) (ref 1.0–2.5)
ALT: 17 U/L (ref 9–46)
AST: 14 U/L (ref 10–35)
Albumin: 4.1 g/dL (ref 3.6–5.1)
Alkaline phosphatase (APISO): 132 U/L (ref 35–144)
BUN: 18 mg/dL (ref 7–25)
CO2: 27 mmol/L (ref 20–32)
Calcium: 9.5 mg/dL (ref 8.6–10.3)
Chloride: 105 mmol/L (ref 98–110)
Creat: 0.99 mg/dL (ref 0.70–1.18)
GFR, Est African American: 84 mL/min/{1.73_m2} (ref >=60)
GFR, Est Non African American: 73 mL/min/{1.73_m2} (ref >=60)
Globulin: 2.2 g/dL (calc) (ref 1.9–3.7)
Glucose, Bld: 95 mg/dL (ref 65–99)
Potassium: 4.5 mmol/L (ref 3.5–5.3)
Sodium: 140 mmol/L (ref 135–146)
Total Bilirubin: 0.6 mg/dL (ref 0.2–1.2)
Total Protein: 6.3 g/dL (ref 6.1–8.1)

## 2019-10-05 LAB — LIPID PANEL
Cholesterol: 191 mg/dL (ref <200)
HDL: 41 mg/dL (ref >=40)
LDL Cholesterol (Calc): 110 mg/dL (calc) — ABNORMAL HIGH
Non-HDL Cholesterol (Calc): 150 mg/dL (calc) — ABNORMAL HIGH (ref <130)
Total CHOL/HDL Ratio: 4.7 (calc) (ref <5.0)
Triglycerides: 281 mg/dL — ABNORMAL HIGH (ref <150)

## 2019-10-05 NOTE — Progress Notes (Signed)
Blood counts are normal.  Cholesterol panel remains abnormal with high triglycerides (starchy fats) and LDL (bad cholesterol).  Bad has improved but starchy has worsened since last year.  We will address at his visit Electrolytes, liver and kidneys are all normal.

## 2019-10-09 ENCOUNTER — Other Ambulatory Visit: Payer: Self-pay

## 2019-10-09 ENCOUNTER — Encounter: Payer: Self-pay | Admitting: Internal Medicine

## 2019-10-09 ENCOUNTER — Ambulatory Visit (INDEPENDENT_AMBULATORY_CARE_PROVIDER_SITE_OTHER): Payer: Medicare Other | Admitting: Internal Medicine

## 2019-10-09 VITALS — BP 122/72 | HR 65 | Temp 97.8°F | Ht 67.0 in | Wt 173.0 lb

## 2019-10-09 DIAGNOSIS — M792 Neuralgia and neuritis, unspecified: Secondary | ICD-10-CM | POA: Diagnosis not present

## 2019-10-09 DIAGNOSIS — N3941 Urge incontinence: Secondary | ICD-10-CM

## 2019-10-09 DIAGNOSIS — K2100 Gastro-esophageal reflux disease with esophagitis, without bleeding: Secondary | ICD-10-CM

## 2019-10-09 DIAGNOSIS — E7800 Pure hypercholesterolemia, unspecified: Secondary | ICD-10-CM | POA: Diagnosis not present

## 2019-10-09 DIAGNOSIS — G119 Hereditary ataxia, unspecified: Secondary | ICD-10-CM

## 2019-10-09 DIAGNOSIS — R1312 Dysphagia, oropharyngeal phase: Secondary | ICD-10-CM

## 2019-10-09 MED ORDER — GABAPENTIN 100 MG PO CAPS: 100.0000 mg | ORAL_CAPSULE | Freq: Every day | ORAL | 3 refills | Status: DC

## 2019-10-09 NOTE — Progress Notes (Signed)
Location:  [facility] clinic Provider:  Jeson Camacho L. Mariea Clonts, D.O., C.M.D.  Code Status: DNR Goals of Care:  Advanced Directives 10/09/2019  Does Patient Have a Medical Advance Directive? Yes  Type of Advance Directive Out of facility DNR (pink MOST or yellow form)  Does patient want to make changes to medical advance directive? No - Patient declined  Copy of [place] in Chart? -  Would patient like information on creating a medical advance directive? -  Pre-existing out of facility DNR order (yellow form or pink MOST form) -     Chief Complaint  Patient presents with  . Medical Management of Chronic Issues    4 month follow up    HPI: Patient is a 79 y.o. male seen today for medical management of chronic diseases.  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  Generally, steadiness is ok.  Has good and bad days though and today is bad.  If he eats and sits down a minute, it usually goes away.  Today, no improvement.  He wonders if there is something to help his shakiness.  His shaking is mostly the right hand and it's kind of a pronation/supination shaking that is actually interfering with his ability to steady himself on standing with his three-wheeled rollator walker.  His wife has a caregiver who helps him, as well, when he is having a bad day.    Occasionally, he gets a sharp pain in the middles of the left foot dorsum.  Comes and goes.  Like a nerve.  Once he sits, it's pretty good.  Not a problem when driving or when he's sitting at his office (yes still drives to work).  More problematic when standing.  No radiation down the leg, just in the foot.   Does not go in when he has a bad day.  He's not been back to Dr. Evelena Leyden at [facility] about his spinocerebellar disease in several years--he had told him to stay busy and exercise.  Warned him his walking and balance would likely get worse.  He gets newsletters about ataxia.  He is interested in seeing a neurologist in this specialty closer by.  He and his wife have had their pfizer covid vaccines.  Reflux--had ribs last night--took one protonix before and had to take one after also.  He quit his caffeine after suppertime.  That was a problem for his reflux (he was drinking several cups of starbucks in the evening!)    Swallowing is no worse.  Still not wanting further eval.  Past Medical History:  Diagnosis Date  . Abnormality of gait September 07 2007  . Allergic rhinitis, cause unspecified 2007  . Arthritis   . Ataxia   . Cervicalgia February 26, 2006  . Chest pain   . Cramp of limb 10/21/11  . Degeneration of intervertebral disc, site unspecified 1998  . 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 Diaphragmatic hernia without mention of obstruction or gangrene 1982   Hiatal hernia  . Diplopia 2006  . Disturbance of skin sensation 2003  . Diverticulosis of colon (without mention of hemorrhage) 1997  . Dizziness and giddiness 2001  . Dysphagia, unspecified(787.20) 1999 and  . Esophagitis, unspecified 1997  . GERD (gastroesophageal reflux disease) 2003  . Hypertension 2003  .  Impotence of organic origin 1999  . Lack of coordination September 07, 2007  . Lumbago 2003  . Migraine with aura, without mention of intractable migraine without mention of status migrainosus 1997  . Myalgia and myositis, unspecified 1999  . Other and unspecified hyperlipidemia 2003  . Other disorders of vitreous 2003  . Other malaise and fatigue 2003  . Other seborrheic keratosis 2013  . Other seborrheic keratosis 04/13/09  . Pain in joint, site unspecified 04/13/12  . Personal history of fall 04/15/11  . Restless legs syndrome (RLS) 1997  . Spinocerebellar  disease, 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, 2008  . Unspecified hearing loss 2003  . Unspecified tinnitus 04/15/11    Past Surgical History:  Procedure Laterality Date  . APPENDECTOMY  Age 33  . boil removed     Dr. Donne Hazel  . C3-4 anterior cervical surgery  1999   Dr. Hal Neer  . CARDIAC CATHETERIZATION  2001   Dr. Glade Lloyd  . COLONOSCOPY  07/20/07   Dr. Sharlett Iles  . EYE SURGERY Bilateral 2015   cataracts  . LAMINOTOMY / EXCISION DISK POSTERIOR CERVICAL SPINE     Spinal Disk (?4-5)    Allergies  Allergen Reactions  . Aciphex [Rabeprazole Sodium]   . Effexor [Venlafaxine Hcl]   . Serzone [Nefazodone]   . Vivactil [Protriptyline Hcl]     Outpatient Encounter Medications as of 10/09/2019  Medication Sig  . acetaminophen (TYLENOL 8 HOUR ARTHRITIS PAIN) 650 MG CR tablet Take 1 tablet (650 mg total) by mouth 2 (two) times daily.  Marland Kitchen amLODipine (NORVASC) 5 MG tablet Take 1 tablet (5 mg total) by mouth daily.  . Capsaicin-Menthol-Methyl Sal (CAPSAICIN-METHYL SAL-MENTHOL) 0.025-1-12 % CREA Apply 1 application topically daily as needed (to knees).  . fluconazole (DIFLUCAN) 200 MG tablet Take 1 tablet (200 mg total) by mouth daily.  Marland Kitchen HYDROcodone-acetaminophen (NORCO/VICODIN) 5-325 MG tablet Take 1/2 tablet by mouth once daily as needed for pain  . losartan (COZAAR) 100 MG tablet Take 1 tablet (100 mg total) by mouth daily.  . metoprolol succinate (TOPROL-XL) 25 MG 24 hr tablet TAKE 1 TABLET BY MOUTH EVERY DAY  . mometasone (NASONEX) 50 MCG/ACT nasal spray Place 2 sprays into the nose daily.   Marland Kitchen nystatin (MYCOSTATIN) 100000 UNIT/ML suspension SWISH AND SPIT WITH 5MLS BY MOUTH 4 TIMES DAILY  . oxybutynin (DITROPAN) 5 MG tablet TAKE 1 TABLET BY MOUTH UP TO 3 TIMES DAILY TO HELP BLADDER CONTROL  . pantoprazole (PROTONIX) 40 MG tablet Take 1 tablet (40 mg total) by mouth 2 (two) times daily before a meal.   No facility-administered encounter medications on file as of 10/09/2019.    Review of  Systems:  Review of Systems  Constitutional: Negative for chills, fever and malaise/fatigue.  HENT: Negative for congestion and sore throat.   Eyes: Negative for blurred vision.       Glasses  Respiratory: Negative for cough and shortness of breath.   Cardiovascular: Negative for chest pain, palpitations and leg swelling.  Gastrointestinal: Positive for heartburn. Negative for abdominal pain, blood in stool, constipation, diarrhea and melena.  Genitourinary: Positive for urgency. Negative for dysuria.       Some incontinence if waits too long  Musculoskeletal: Positive for back pain and falls. Negative for joint pain.  Skin: Negative for itching and rash.  Neurological: Positive for tingling, tremors, sensory change, speech change, weakness and headaches. Negative for dizziness, focal weakness and loss of consciousness.  Psychiatric/Behavioral: Negative for depression, hallucinations and memory loss. The patient  is not nervous/anxious and does not have insomnia.     Health Maintenance  Topic Date Due  . INFLUENZA VACCINE  12/31/2019  . TETANUS/TDAP  06/11/2021  . COVID-19 Vaccine  Completed  . PNA vac Low Risk Adult  Completed    Physical Exam: Vitals:   10/09/19 1422  BP: 122/72  Pulse: 65  Temp: 97.8 F (36.6 C)  SpO2: 98%  Weight: 173 lb (78.5 kg)  Height: 5\' 7"  (1.702 m)   Body mass index is 27.1 kg/m. Physical Exam Vitals reviewed.  Constitutional:      General: He is not in acute distress.    Appearance: Normal appearance. He is not toxic-appearing.  HENT:     Head: Normocephalic and atraumatic.     Right Ear: External ear normal.     Left Ear: External ear normal.     Mouth/Throat:     Pharynx: Oropharynx is clear.  Eyes:     Extraocular Movements: Extraocular movements intact.     Pupils: Pupils are equal, round, and reactive to light.     Comments: glasses  Cardiovascular:     Rate and Rhythm: Normal rate and regular rhythm.     Pulses: Normal pulses.       Heart sounds: Normal heart sounds.  Pulmonary:     Effort: Pulmonary effort is normal.     Breath sounds: Normal breath sounds. No wheezing, rhonchi or rales.  Abdominal:     General: Bowel sounds are normal.     Palpations: Abdomen is soft.  Musculoskeletal:        General: No tenderness. Normal range of motion.     Right lower leg: No edema.     Left lower leg: No edema.  Skin:    General: Skin is warm and dry.  Neurological:     Mental Status: He is alert and oriented to person, place, and time.     Cranial Nerves: Cranial nerve deficit present.     Sensory: Sensory deficit present.     Motor: Weakness present.     Coordination: Coordination abnormal.     Gait: Gait abnormal.     Comments: Unsteady today, required assistance from caregiver to stand, get steady and walk with his three-wheeled walker with seat; tremor of upper extremities--large amplitude pronation/supination type of motion that limits ability to grasp his walker; dysarthria and some stuttering of speech, as well, hypophonia  Psychiatric:        Mood and Affect: Mood normal.        Behavior: Behavior normal.        Thought Content: Thought content normal.        Judgment: Judgment normal.     Labs reviewed: Basic Metabolic Panel: Recent Labs    10/04/19 0949  NA 140  K 4.5  CL 105  CO2 27  GLUCOSE 95  BUN 18  CREATININE 0.99  CALCIUM 9.5   Liver Function Tests: Recent Labs    10/04/19 0949  AST 14  ALT 17  BILITOT 0.6  PROT 6.3   No results for input(s): LIPASE, AMYLASE in the last 8760 hours. No results for input(s): AMMONIA in the last 8760 hours. CBC: Recent Labs    10/04/19 0949  WBC 7.4  NEUTROABS 4,433  HGB 15.1  HCT 44.5  MCV 91.0  PLT 208   Lipid Panel: Recent Labs    10/04/19 0949  CHOL 191  HDL 41  LDLCALC 110*  TRIG 281*  CHOLHDL 4.7  Assessment/Plan 1. 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[place]) -has been progressing -he does not wish to go back to [facility] b/c he  does not think they will tell him anything new -will refer to [facility] and see if they will see him especially about mgt of the "tremor" of his arms that is interfering in his functioning -he needs therapy also and neuro therapy would be best  - Ambulatory referral to Neurology  2. Neuropathic pain of left foot -he agrees to try gabapentin just at hs to see if it helps due to risk of it affecting his already poor balance and causing fatigue - gabapentin (NEURONTIN) 100 MG capsule; Take 1 capsule (100 mg total) by mouth at bedtime.  Dispense: 30 capsule; Refill: 3 - Ambulatory referral to Neurology  3. Pure hypercholesterolemia -last LDL 110 with goal less than 100--he was going to try to cut down on beef and sweets which caregiver was getting them more of recently   4. Oropharyngeal dysphagia - ongoing, he refuses further investigation at this time; of course, this is related to his spinocerebellar disease - Ambulatory referral to Neurology  5. Gastroesophageal reflux disease with esophagitis without hemorrhage -cont PPI before largest meal  6.  Urge incontinence -is taking ditropan (aware of anticholinergic effects) but he notes some benefit and wants to take it--at one point cost of myrbetriq was too much--might try again next time I see him to switch this b/c coverage may have improved  Uses the norco occasionally for severe headache and has been counseled about risks and chooses to take due to benefit for him  Labs/tests ordered:   Orders Placed This Encounter  Procedures  . Ambulatory referral to Neurology    Referral Priority:   Routine    Referral Type:   Consultation    Referral Reason:   Specialty Services Required    Requested Specialty:   Neurology    Number of Visits Requested:   1  asked referral coordinator to call to see if [facility] would indeed take patient and address his specific illness but she said to put in referral and they would  decide  Next appt:  02/19/2020 med mgt   Abbegail Matuska L. Sigismund Cross, D.O. [facility] [address], [place] [address] Cell Phone (Mon-Fri 8am-5pm):  [phone] On Call:  [phone] & follow prompts after 5pm & weekends Office Phone:  [phone] Office Fax:  [phone]

## 2019-10-09 NOTE — Patient Instructions (Signed)
Cut down on the beef and sweets.  Also be careful with biscuits and starchy foods.    We will see if [facility] or [facility] can see you about your shaking.    Try gabapentin at bedtime about your left foot nerve pain.

## 2019-10-12 ENCOUNTER — Encounter: Payer: Self-pay | Admitting: Neurology

## 2019-11-03 NOTE — Progress Notes (Signed)
Assessment/Plan:   1.  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    -Discussed during the genetic testing for SCA.  This certainly would not change any of the therapeutics, however.  It may, however, have implications for his children and their children.  He is interested.  We will send an order to [facility].  -Discussed safety.  He is using a 3 wheeled walker.  I think he may do better with a U step, but he really has not had a fall in a year, so I cannot argue that he must be doing something well.  -LOC orders reviewed.  Pt expressed desire to be DNR  2.  Dysphagia  -declines MBE.  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  Discussed the difference between intention tremor and ataxic tremor.  Discussed that ataxia tremor really is not treatable.  However, he described some tremor that did not sound purely like ataxic tremor.  I did not see any tremor on examination.  Discussed that there really are no "tremor drugs" on the market.  Discussed that beta-blockers can help, but that he was already on metoprolol, although very low dose.  Discussed that potentially if metoprolol could be changed to propranolol, it could help the tremor.  I will send a note to Dr. Mariea Clonts about that and he will follow up with her.  4.  Happy to see patient back on an as-needed basis. Subjective:   Justin [name] was seen today in neurologic consultation at the request of Reed, Tiffany L, DO.  The consultation is for the evaluation of ataxia.  Patient previously seen by Dr. Gilford Rile at [facility] for the same thing.   This patient is accompanied in the office by his cousin who supplements the history. He was last seen in May, 2014.  At that point in time, Dr. Gilford Rile reported that symptoms had been progressing.  It does not appear that the patient had a specific diagnosis other than progressive ataxia.  On examination from Dr. Gilford Rile, the patient had dysarthria and jerky  saccades.  Pt reports that his sx's started in 2007/2008 and it started with diplopia and he started the investigation with ophthalmology.  Initially, he got prisms and it helped.  Then he began to have balance trouble and started using a cane and later a walker.  Doesn't have a lot of falls - last fall was a year ago.  If he does fall, he falls backwards.  He has trouble with swallowing in all consistencies - solids, liquids, pills, saliva.  He doesn't want swallow test.  Lives in one story house with one step to kitchen.  Wife has caregiver who helps pt as well.  Caregiver present 4-5 hours per day.  Caregiver does the cooking/laundry/cleaning.    Re: fam hx.  Father had 13 brothers and sisters and 4 of them had similar syndrome, including his father.  His uncle is 45 y/o and has this and is in a WC from "this" and is still living.    At the very end of the visit, the patient mentions tremor.  Some visit is as he is approaching an object.  Other times, he is holding onto an object when it happens.  Sometimes, he is just sitting on the toilet when he noticed hands shaking.   Patient's last neuroimaging of the brain was done in 2008.  This was unremarkable.  I personally reviewed that.  ALLERGIES:   Allergies  Allergen Reactions  . Aciphex [Rabeprazole Sodium]   . Effexor [Venlafaxine Hcl]   . Serzone [Nefazodone]   . Vivactil [Protriptyline Hcl]     CURRENT MEDICATIONS:  Outpatient Encounter Medications as of 11/07/2019  Medication Sig  . acetaminophen (TYLENOL 8 HOUR ARTHRITIS PAIN) 650 MG CR tablet Take 1 tablet (650 mg total) by mouth 2 (two) times daily.  Marland Kitchen amLODipine (NORVASC) 5 MG tablet Take 1 tablet (5 mg total) by mouth daily.  . Capsaicin-Menthol-Methyl Sal (CAPSAICIN-METHYL SAL-MENTHOL) 0.025-1-12 % CREA Apply 1 application topically daily as needed (to knees).  . fluconazole (DIFLUCAN) 200 MG tablet Take 1 tablet (200 mg total) by mouth daily.  Marland Kitchen gabapentin (NEURONTIN) 100 MG  capsule Take 1 capsule (100 mg total) by mouth at bedtime.  Marland Kitchen HYDROcodone-acetaminophen (NORCO/VICODIN) 5-325 MG tablet Take 1/2 tablet by mouth once daily as needed for pain  . losartan (COZAAR) 100 MG tablet Take 1 tablet (100 mg total) by mouth daily.  . metoprolol succinate (TOPROL-XL) 25 MG 24 hr tablet TAKE 1 TABLET BY MOUTH EVERY DAY  . mometasone (NASONEX) 50 MCG/ACT nasal spray Place 2 sprays into the nose daily.   Marland Kitchen oxybutynin (DITROPAN) 5 MG tablet TAKE 1 TABLET BY MOUTH UP TO 3 TIMES DAILY TO HELP BLADDER CONTROL  . pantoprazole (PROTONIX) 40 MG tablet Take 1 tablet (40 mg total) by mouth 2 (two) times daily before a meal.   No facility-administered encounter medications on file as of 11/07/2019.    Objective:   PHYSICAL EXAMINATION:    VITALS:   Vitals:   11/07/19 0848  BP: (!) 152/87  Pulse: 86  Resp: 20  SpO2: 99%  Weight: 172 lb (78 kg)  Height: 5\' 7"  (1.702 m)    GEN:  Normal appears male in no acute distress.  Appears stated age. HEENT:  Normocephalic, atraumatic. The mucous membranes are moist. The superficial temporal arteries are without ropiness or tenderness. Cardiovascular: Regular rate and rhythm. Lungs: Clear to auscultation bilaterally.  There is inspiratory stridor Neck/Heme: There are no carotid bruits noted bilaterally.  NEUROLOGICAL: Orientation:  The patient is alert and oriented x 3.   Cranial nerves: There is good facial symmetry.  Extraocular muscles are intact and visual fields are full to confrontational testing. Speech is fluent and pseudobulbar and dysarthric. There are square wave jerks and horizontal nystagmus.   Sig trouble with gutteral sounds.  There are jerky saccades. Soft palate rises symmetrically and there is no tongue deviation. Hearing is intact to conversational tone. Tone: Tone is good throughout. Sensation: Sensation is intact to light touch and pinprick throughout (facial, trunk, extremities). Vibration is intact at the  bilateral big toe. There is no extinction with double simultaneous stimulation. There is no sensory dermatomal level identified. Coordination:  The patient has no difficulty with RAM's or FNF bilaterally. Motor: Strength is 5/5 in the bilateral upper and lower extremities.  Shoulder shrug is equal and symmetric. There is no pronator drift.  There are no fasciculations noted. DTR's: Deep tendon reflexes are 2/4 at the bilateral biceps, triceps, brachioradialis, 3-3+ at the bilateral patella and achilles.  Plantar responses are downgoing bilaterally. Gait and Station: The patient pushes off to arise.  He is very ataxic even with the walker.      Total time spent on today's visit was  60 minutes, including both face-to-face time and nonface-to-face time.  Time included that spent on review of records (prior notes available to me/labs/imaging if pertinent),  discussing treatment and goals, answering patient's questions and coordinating care.   Cc:  Reed, Tiffany L, DO

## 2019-11-07 ENCOUNTER — Encounter: Payer: Self-pay | Admitting: Neurology

## 2019-11-07 ENCOUNTER — Ambulatory Visit: Payer: Medicare Other | Admitting: Neurology

## 2019-11-07 ENCOUNTER — Other Ambulatory Visit: Payer: Self-pay

## 2019-11-07 VITALS — BP 152/87 | HR 86 | Resp 20 | Ht 67.0 in | Wt 172.0 lb

## 2019-11-07 DIAGNOSIS — H532 Diplopia: Secondary | ICD-10-CM | POA: Diagnosis not present

## 2019-11-07 DIAGNOSIS — R471 Dysarthria and anarthria: Secondary | ICD-10-CM | POA: Diagnosis not present

## 2019-11-07 DIAGNOSIS — G118 Other hereditary ataxias: Secondary | ICD-10-CM

## 2019-11-16 ENCOUNTER — Other Ambulatory Visit: Payer: Self-pay

## 2019-11-16 ENCOUNTER — Encounter: Payer: Self-pay | Admitting: Internal Medicine

## 2019-11-16 ENCOUNTER — Ambulatory Visit (INDEPENDENT_AMBULATORY_CARE_PROVIDER_SITE_OTHER): Payer: Medicare Other | Admitting: Internal Medicine

## 2019-11-16 VITALS — BP 130/82 | HR 83 | Temp 97.5°F | Ht 67.0 in | Wt 174.0 lb

## 2019-11-16 DIAGNOSIS — R1031 Right lower quadrant pain: Secondary | ICD-10-CM | POA: Diagnosis not present

## 2019-11-16 DIAGNOSIS — R270 Ataxia, unspecified: Secondary | ICD-10-CM

## 2019-11-16 DIAGNOSIS — G119 Hereditary ataxia, unspecified: Secondary | ICD-10-CM | POA: Diagnosis not present

## 2019-11-16 DIAGNOSIS — R102 Pelvic and perineal pain: Secondary | ICD-10-CM | POA: Diagnosis not present

## 2019-11-16 MED ORDER — PROPRANOLOL HCL 40 MG PO TABS: 40.0000 mg | ORAL_TABLET | Freq: Two times a day (BID) | ORAL | 0 refills | Status: DC

## 2019-11-16 NOTE — Patient Instructions (Addendum)
Stop metoprolol (toprol xl) 25mg  daily Start propranolol 40mg  twice a day. We'll check your urine for infection.

## 2019-11-16 NOTE — Progress Notes (Signed)
Location:  [facility] clinic Provider:  Erika [name] Justin [name], D.O., C.M.D.  Code Status: DNR Goals of Care:  Advanced Directives 11/16/2019  Does Patient Have a Medical Advance Directive? -  Type of Advance Directive Out of facility DNR (pink MOST or yellow form)  Does patient want to make changes to medical advance directive? No - Patient declined  Copy of [place] in Chart? -  Would patient like information on creating a medical advance directive? -  Pre-existing out of facility DNR order (yellow form or pink MOST form) -     Chief Complaint  Patient presents with  . Acute Visit    Right side Abdomen pain     HPI: Patient is a 79 y.o. [name] seen today for acute visit for pain on his right side of his lower abdomen/groin.  He did not sleep well last night.  He is hurting right across his groin from the head of his penis.  He had dark urine.  After emptying his bladder, it did not hurt as bad.  There is still pain in that area today.  No hematuria.  No dysuria.  He does not really note frequency. A couple of days ago, he was going more often.  Maybe a little increase back pain two days ago, also.  None last night or today.  He's been feeling a little bit cold--he wore a longsleeve today.  The air was blowing on him in his office though.  No stream issues.    Bowels are moving ok.  He had a little bit too spicy supper last night and had reflux with it.  Had some abdominal pain, too.   Past Medical History:  Diagnosis Date  . Abnormality of gait September 07 2007  . Allergic rhinitis, cause unspecified 2007  . Arthritis   . Ataxia   . Cervicalgia February 26, 2006  . Chest pain   . Cramp of limb 10/21/11  . Degeneration of intervertebral disc, site unspecified 1998  . 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 Diaphragmatic hernia without mention of obstruction or gangrene 1982   Hiatal hernia  . Diplopia 2006  . Disturbance of skin sensation  2003  . Diverticulosis of colon (without mention of hemorrhage) 1997  . Dizziness and giddiness 2001  . Dysphagia, unspecified(787.20) 1999 and  . Esophagitis, unspecified 1997  . GERD (gastroesophageal reflux disease) 2003  . Hypertension 2003  . Impotence of organic origin 1999  . Lack of coordination September 07, 2007  . Lumbago 2003  . Migraine with aura, without mention of intractable migraine without mention of status migrainosus 1997  . Myalgia and myositis, unspecified 1999  . Other and unspecified hyperlipidemia 2003  . Other disorders of vitreous 2003  . Other malaise and fatigue 2003  . Other seborrheic keratosis 2013  . Other seborrheic keratosis 04/13/09  . Pain in joint, site unspecified 04/13/12  . Personal history of fall 04/15/11  . Restless legs syndrome (RLS) 1997  . 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 Unspecified hearing loss 2003  . Unspecified tinnitus 04/15/11    Past Surgical History:  Procedure Laterality Date  . APPENDECTOMY  Age 78  . boil removed     Dr. Donne Hazel  . C3-4 anterior cervical surgery  1999   Dr. Hal Neer  . CARDIAC CATHETERIZATION  2001   Dr. Glade Lloyd  . COLONOSCOPY  07/20/07   Dr. Sharlett Iles  . EYE SURGERY Bilateral 2015  cataracts  . LAMINOTOMY / EXCISION DISK POSTERIOR CERVICAL SPINE     Spinal Disk (?4-5)    Allergies  Allergen Reactions  . Aciphex [Rabeprazole Sodium]   . Effexor [Venlafaxine Hcl]   . Serzone [Nefazodone]   . Vivactil [Protriptyline Hcl]     Outpatient Encounter Medications as of 11/16/2019  Medication Sig  . acetaminophen (TYLENOL 8 HOUR ARTHRITIS PAIN) 650 MG CR tablet Take 1 tablet (650 mg total) by mouth 2 (two) times daily.  Marland Kitchen amLODipine (NORVASC) 5 MG tablet Take 1 tablet (5 mg total) by mouth daily.  . Capsaicin-Menthol-Methyl Sal (CAPSAICIN-METHYL SAL-MENTHOL) 0.025-1-12 % CREA Apply 1 application topically daily as needed (to knees).  . fluconazole (DIFLUCAN) 200 MG tablet Take  1 tablet (200 mg total) by mouth daily.  Marland Kitchen gabapentin (NEURONTIN) 100 MG capsule Take 1 capsule (100 mg total) by mouth at bedtime.  Marland Kitchen HYDROcodone-acetaminophen (NORCO/VICODIN) 5-325 MG tablet Take 1/2 tablet by mouth once daily as needed for pain  . losartan (COZAAR) 100 MG tablet Take 1 tablet (100 mg total) by mouth daily.  . metoprolol succinate (TOPROL-XL) 25 MG 24 hr tablet TAKE 1 TABLET BY MOUTH EVERY DAY  . mometasone (NASONEX) 50 MCG/ACT nasal spray Place 2 sprays into the nose daily.   Marland Kitchen oxybutynin (DITROPAN) 5 MG tablet TAKE 1 TABLET BY MOUTH UP TO 3 TIMES DAILY TO HELP BLADDER CONTROL  . pantoprazole (PROTONIX) 40 MG tablet Take 1 tablet (40 mg total) by mouth 2 (two) times daily before a meal.   No facility-administered encounter medications on file as of 11/16/2019.    Review of Systems:  Review of Systems  Constitutional: Negative for chills and fever.  HENT: Negative for sore throat.   Respiratory: Negative for cough and shortness of breath.   Cardiovascular: Negative for chest pain, palpitations and leg swelling.  Gastrointestinal: Positive for abdominal pain and heartburn. Negative for blood in stool, constipation, diarrhea, melena, nausea and vomiting.  Genitourinary: Positive for dysuria and frequency. Negative for flank pain, hematuria and urgency.       Dark color urine reported, but light yellow when sample collected here  Neurological: Positive for tremors. Negative for loss of consciousness.       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  Psychiatric/Behavioral: Negative for depression and memory loss. The patient is not nervous/anxious.     Health Maintenance  Topic Date Due  . Hepatitis C Screening  Never done  . INFLUENZA VACCINE  12/31/2019  . TETANUS/TDAP  06/11/2021  . COVID-19 Vaccine  Completed  . PNA vac Low Risk Adult  Completed    Physical Exam: Vitals:   11/16/19 1441  BP: 130/82  Pulse:  83  Temp: (!) 97.5 F (Justin.4 C)  TempSrc: Temporal  SpO2: 96%  Weight: 174 lb (78.9 kg)  Height: 5\' 7"  (1.702 m)   Body mass index is 27.25 kg/m. Physical Exam Vitals reviewed.  Constitutional:      Appearance: Normal appearance.  Cardiovascular:     Rate and Rhythm: Normal rate and regular rhythm.     Pulses: Normal pulses.     Heart sounds: Normal heart sounds.  Pulmonary:     Effort: Pulmonary effort is normal.     Breath sounds: Normal breath sounds.  Abdominal:     General: Bowel sounds are normal. There is no distension.     Palpations: Abdomen is soft. There is no mass.     Tenderness: There is abdominal tenderness.  There is no guarding or rebound.     Hernia: No hernia is present.     Comments: Normal appearing abdomen, mild tenderness of right groin, no visible hernia with cough  Genitourinary:    Penis: Normal.      Testes: Normal.  Musculoskeletal:        General: Normal range of motion.     Right lower leg: No edema.     Left lower leg: No edema.     Comments: Ataxic gait, uses three-wheeled rollator walker with seat  Skin:    General: Skin is warm and dry.     Findings: No erythema.  Neurological:     Mental Status: He is alert.     Comments: Dysarthric speech, ataxia  Psychiatric:        Mood and Affect: Mood normal.        Behavior: Behavior normal.        Thought Content: Thought content normal.        Judgment: Judgment normal.     Labs reviewed: Basic Metabolic Panel: Recent Labs    10/04/19 0949  NA 140  K 4.5  CL 105  CO2 27  GLUCOSE 95  BUN 18  CREATININE 0.99  CALCIUM 9.5   Liver Function Tests: Recent Labs    10/04/19 0949  AST 14  ALT 17  BILITOT 0.6  PROT 6.3   No results for input(s): LIPASE, AMYLASE in the last 8760 hours. No results for input(s): AMMONIA in the last 8760 hours. 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  Dispense: 60 tablet; Refill: 0  2. Ataxia -continue rollator walker for support and caregiver assistance on bad days - propranolol (INDERAL) 40 MG tablet; Take 1 tablet (40 mg total) by mouth 2 (two) times daily.  Dispense: 60 tablet; Refill: 0  3. Right groin pain - unclear etiology -r/o UTI with symptoms he's had a few days ago and now this discomfort -I don't see a hernia -also could be a passed stone vs prostate-related--if persists, may need rectal exam and/or CT abd/pelvis -push fluids and await urine results - Urine Culture - Urinalysis, Routine w reflex microscopic  4. Suprapubic pain - did have some of this a couple days ago when it began - Urine Culture - Urinalysis, Routine w reflex microscopic  Labs/tests ordered:   Lab Orders     Urine Culture     Urinalysis, Routine w reflex microscopic Next appt:  02/19/2020   Demarlo Riojas L. Bibi Economos, D.O. [facility] [address], [place] [address] Cell Phone (Mon-Fri 8am-5pm):  [phone] On Call:  [phone] & follow prompts after 5pm & weekends Office Phone:  [phone] Office Fax:  [phone]

## 2019-11-17 LAB — URINALYSIS, ROUTINE W REFLEX MICROSCOPIC
Bilirubin Urine: NEGATIVE
Glucose, UA: NEGATIVE
Hgb urine dipstick: NEGATIVE
Ketones, ur: NEGATIVE
Leukocytes,Ua: NEGATIVE
Nitrite: NEGATIVE
Protein, ur: NEGATIVE
Specific Gravity, Urine: 1.022 (ref 1.001–1.03)
pH: <=5 (ref 5.0–8.0)

## 2019-11-17 LAB — URINE CULTURE
MICRO NUMBER:: 10604876
SPECIMEN QUALITY:: ADEQUATE

## 2019-11-17 NOTE — Progress Notes (Signed)
Initial urine test appears negative.  Culture is pending.

## 2019-11-18 NOTE — Progress Notes (Signed)
Final culture showed a mixture of organisms likely from skin flora and contamination during sample collection.  No true infection.   If he continues to have his same pain, we may need to evaluate other labs and imaging.

## 2019-11-30 ENCOUNTER — Other Ambulatory Visit: Payer: Self-pay | Admitting: Internal Medicine

## 2019-11-30 DIAGNOSIS — G119 Hereditary ataxia, unspecified: Secondary | ICD-10-CM

## 2019-11-30 DIAGNOSIS — R270 Ataxia, unspecified: Secondary | ICD-10-CM

## 2019-11-30 NOTE — Telephone Encounter (Signed)
This was just filled 11/16/2019 for 180 tablets

## 2019-12-07 ENCOUNTER — Encounter: Payer: Self-pay | Admitting: Internal Medicine

## 2019-12-08 ENCOUNTER — Other Ambulatory Visit: Payer: Self-pay | Admitting: Internal Medicine

## 2019-12-08 DIAGNOSIS — I10 Essential (primary) hypertension: Secondary | ICD-10-CM

## 2019-12-12 ENCOUNTER — Other Ambulatory Visit: Payer: Self-pay | Admitting: Internal Medicine

## 2019-12-19 ENCOUNTER — Encounter: Payer: Self-pay | Admitting: Neurology

## 2019-12-28 ENCOUNTER — Other Ambulatory Visit: Payer: Self-pay | Admitting: Internal Medicine

## 2019-12-28 DIAGNOSIS — R270 Ataxia, unspecified: Secondary | ICD-10-CM

## 2019-12-28 DIAGNOSIS — G119 Hereditary ataxia, unspecified: Secondary | ICD-10-CM

## 2020-01-18 ENCOUNTER — Telehealth: Payer: Self-pay | Admitting: Neurology

## 2020-01-18 NOTE — Telephone Encounter (Signed)
Justin [name] with [facility] called and left a message wanting to confirm if we have gotten some paperwork. She said no need to call back if we have received them. Her phone number is [phone] Option 2.

## 2020-01-19 ENCOUNTER — Telehealth: Payer: Self-pay | Admitting: Neurology

## 2020-01-19 NOTE — Telephone Encounter (Signed)
Spoke with Anda Kraft at [facility], she states she was calling to make sure that Dr Tat received patients positive ataxia results.   Informed her that we have. She thanked me for calling her back.

## 2020-01-19 NOTE — Telephone Encounter (Signed)
I put the patient on the sch and left him a message will call him back later today if we do not hear from him

## 2020-01-19 NOTE — Progress Notes (Signed)
Virtual Visit Via Video   The purpose of this virtual visit is to provide medical care while limiting exposure to the novel coronavirus.    Consent was obtained for video visit:  Yes.   Answered questions that patient had about telehealth interaction:  Yes.   I discussed the limitations, risks, security and privacy concerns of performing an evaluation and management service by telemedicine. I also discussed with the patient that there may be a patient responsible charge related to this service. The patient expressed understanding and agreed to proceed.  Pt location: Home Physician Location: office Name of referring provider:  Gayland Curry, DO I connected with Justin [name] at patients initiation/request on 01/22/2020 at  2:00 PM EDT by video enabled telemedicine application and verified that I am speaking with the correct person using two identifiers. 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  Discussed implications of this for family/future generations.  Patient asked questions and I answered those to the best of my ability today.  -Discussed symptoms of the disease which could include vision change (started with diplopia) vertigo, nystagmus.  Discussed that this is a slowly progressive form of spinocerebellar degeneration.  Patient has had symptoms since 2007.  2.  Dysphagia  -Declines MBE  3.  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  -On propranolol, 40 mg twice daily (on primarily for blood pressure).  Patient is not sure it has been helpful, but without side effects.  May need to increase in the future, although I did tell him that it may not help ataxic/cerebellar tremor.  4.  F/u prn Subjective:   Justin [name] was seen today in follow up for results of his genetic testing.  My previous records as well as any outside records available were  reviewed prior to todays visit.  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    CURRENT MEDICATIONS:  Outpatient Encounter Medications as of 01/22/2020  Medication Sig  . acetaminophen (TYLENOL 8 HOUR ARTHRITIS PAIN) 650 MG CR tablet Take 1 tablet (650 mg total) by mouth 2 (two) times daily.  Marland Kitchen amLODipine (NORVASC) 5 MG tablet Take 1 tablet (5 mg total) by mouth daily.  Marland Kitchen gabapentin (NEURONTIN) 100 MG capsule Take 1 capsule (100 mg total) by mouth at bedtime.  Marland Kitchen HYDROcodone-acetaminophen (NORCO/VICODIN) 5-325 MG tablet Take 1/2 tablet by mouth once daily as needed for pain  . losartan (COZAAR) 100 MG tablet TAKE 1 TABLET (100 MG TOTAL) BY MOUTH DAILY.  Marland Kitchen oxybutynin (DITROPAN) 5 MG tablet TAKE 1 TABLET BY MOUTH UP TO 3 TIMES DAILY TO HELP BLADDER CONTROL (Patient taking differently: 5 mg daily. )  . pantoprazole (PROTONIX) 40 MG tablet Take 1 tablet (40 mg total) by mouth 2 (two) times daily before a meal. (Patient taking differently: Take 40 mg by mouth daily. )  . propranolol (INDERAL) 40 MG tablet TAKE 1 TABLET BY MOUTH TWICE A DAY  . [DISCONTINUED] Capsaicin-Menthol-Methyl Sal (CAPSAICIN-METHYL SAL-MENTHOL) 0.025-1-12 % CREA Apply 1 application topically daily as needed (to knees). (Patient not taking: Reported on 01/22/2020)  . [DISCONTINUED] fluconazole (DIFLUCAN) 200 MG tablet Take 1 tablet (200 mg total) by mouth daily. (Patient not taking: Reported on 01/22/2020)  . [DISCONTINUED] mometasone (NASONEX) 50  MCG/ACT nasal spray Place 2 sprays into the nose daily.  (Patient not taking: Reported on 01/22/2020)   No facility-administered encounter medications on file as of 01/22/2020.     Objective:   PHYSICAL EXAMINATION:    VITALS:   Vitals:   01/22/20 1320  Weight: 165 lb (74.8 kg)  Height: 5\' 7"  (1.702 m)    GEN:  The patient appears stated age and is in NAD.   Neurological  examination:  Orientation: The patient is alert and oriented x3. Cranial nerves: There is good facial symmetry.The speech is fluent and pseudobulbar and mildly dysarthric   Total time spent on today's visit was 20 minutes, including both face-to-face time and nonface-to-face time.  Time included that spent on review of records (prior notes available to me/labs/imaging if pertinent), discussing treatment and goals, answering patient's questions and coordinating care.  Cc:  Reed, Tiffany L, DO

## 2020-01-19 NOTE — Telephone Encounter (Signed)
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  Please call pt to put him on Monday at 2pm (VV is fine) to review results of genetic testing

## 2020-01-22 ENCOUNTER — Telehealth (INDEPENDENT_AMBULATORY_CARE_PROVIDER_SITE_OTHER): Payer: Medicare Other | Admitting: Neurology

## 2020-01-22 ENCOUNTER — Encounter: Payer: Self-pay | Admitting: Neurology

## 2020-01-22 ENCOUNTER — Other Ambulatory Visit: Payer: Self-pay

## 2020-01-22 VITALS — Ht 67.0 in | Wt 165.0 lb

## 2020-01-22 DIAGNOSIS — G118 Other hereditary ataxias: Secondary | ICD-10-CM | POA: Insufficient documentation

## 2020-02-14 ENCOUNTER — Other Ambulatory Visit: Payer: Self-pay | Admitting: Internal Medicine

## 2020-02-14 DIAGNOSIS — M792 Neuralgia and neuritis, unspecified: Secondary | ICD-10-CM

## 2020-02-19 ENCOUNTER — Encounter: Payer: Self-pay | Admitting: Internal Medicine

## 2020-02-19 ENCOUNTER — Ambulatory Visit (INDEPENDENT_AMBULATORY_CARE_PROVIDER_SITE_OTHER): Payer: Medicare Other | Admitting: Internal Medicine

## 2020-02-19 ENCOUNTER — Other Ambulatory Visit: Payer: Self-pay

## 2020-02-19 ENCOUNTER — Telehealth: Payer: Self-pay

## 2020-02-19 VITALS — BP 138/62 | HR 59 | Temp 97.7°F | Ht 67.0 in | Wt 165.0 lb

## 2020-02-19 DIAGNOSIS — G119 Hereditary ataxia, unspecified: Secondary | ICD-10-CM | POA: Diagnosis not present

## 2020-02-19 DIAGNOSIS — K2100 Gastro-esophageal reflux disease with esophagitis, without bleeding: Secondary | ICD-10-CM

## 2020-02-19 DIAGNOSIS — Z1159 Encounter for screening for other viral diseases: Secondary | ICD-10-CM | POA: Diagnosis not present

## 2020-02-19 DIAGNOSIS — R1031 Right lower quadrant pain: Secondary | ICD-10-CM

## 2020-02-19 DIAGNOSIS — R0600 Dyspnea, unspecified: Secondary | ICD-10-CM

## 2020-02-19 DIAGNOSIS — R1312 Dysphagia, oropharyngeal phase: Secondary | ICD-10-CM

## 2020-02-19 DIAGNOSIS — Z23 Encounter for immunization: Secondary | ICD-10-CM

## 2020-02-19 DIAGNOSIS — R0609 Other forms of dyspnea: Secondary | ICD-10-CM

## 2020-02-19 NOTE — Progress Notes (Signed)
Location:  [facility] clinic Provider:  Chanti Golubski L. Mariea Clonts, D.O., C.M.D.  Code Status: DNR Goals of Care:  Advanced Directives 02/19/2020  Does Patient Have a Medical Advance Directive? Yes  Type of Advance Directive Out of facility DNR (pink MOST or yellow form)  Does patient want to make changes to medical advance directive? No - Patient declined  Copy of [place] in Chart? -  Would patient like information on creating a medical advance directive? -  Pre-existing out of facility DNR order (yellow form or pink MOST form) -     Chief Complaint  Patient presents with  . Medical Management of Chronic Issues    4 month follow up   . Health Maintenance    Hep C and Influenza   . Acute Visit    itching on the side of penis     HPI: Patient is a 79 y.o. male seen today for medical management of chronic diseases.    His scrotum has been itchy.  He thinks he has a yeast infection.  They've been putting nystatin powder on it.  The right groin pain has gone away when he uses warm water.  Itching is also improving with this powder.    Flu shot done.    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    He's noticed he's getting short of breath getting on and off the exam table (happened last visit).  Has also noticed this when he gets out in the morning and puts his pants on and goes back and forth from the end of the bed and has to hold onto the dresser.  Not short of breath walking in here.  Does not otherwise go up stairs or up hill.  He does give out going from the office to his car.  He used to go to [employer] or [employer] with a buggy and could go around but now cannot b/c of his breathing (not his walking/ataxia).     He reports 173 lbs today but CMA indicates 165 lbs was his correct weight.    Past Medical History:  Diagnosis Date  . Abnormality of gait September 07 2007  . Allergic rhinitis, cause unspecified 2007  . Arthritis   .  Ataxia   . Cervicalgia February 26, 2006  . Chest pain   . Cramp of limb 10/21/11  . Degeneration of intervertebral disc, site unspecified 1998  . 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 Diaphragmatic hernia without mention of obstruction or gangrene 1982   Hiatal hernia  . Diplopia 2006  . Disturbance of skin sensation 2003  . Diverticulosis of colon (without mention of hemorrhage) 1997  . Dizziness and giddiness 2001  . Dysphagia, unspecified(787.20) 1999 and  . Esophagitis, unspecified 1997  . GERD (gastroesophageal reflux disease) 2003  . Hypertension 2003  . Impotence of organic origin 1999  . Lack of coordination September 07, 2007  . Lumbago 2003  . Migraine with aura, without mention of intractable migraine without mention of status migrainosus 1997  . Myalgia and myositis, unspecified 1999  . Other and unspecified hyperlipidemia 2003  . Other disorders of vitreous 2003  . Other malaise and fatigue 2003  . Other seborrheic keratosis 2013  . Other seborrheic keratosis 04/13/09  . Pain in joint, site unspecified 04/13/12  . Personal history of fall 04/15/11  . Restless legs syndrome (RLS) 1997  . 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 Unspecified hearing loss 2003  . Unspecified tinnitus 04/15/11    Past Surgical History:  Procedure Laterality Date  . APPENDECTOMY  Age 32  . boil removed     Dr. Donne Hazel  . C3-4 anterior cervical surgery  1999   Dr. Hal Neer  . CARDIAC CATHETERIZATION  2001   Dr. Glade Lloyd  . COLONOSCOPY  07/20/07   Dr. Sharlett Iles  . EYE SURGERY Bilateral 2015   cataracts  . LAMINOTOMY / EXCISION DISK POSTERIOR CERVICAL SPINE     Spinal Disk (?4-5)    Allergies  Allergen Reactions  . Aciphex [Rabeprazole Sodium]   . Effexor [Venlafaxine Hcl]   . Serzone [Nefazodone]   . Vivactil [Protriptyline Hcl]     Outpatient Encounter Medications as of 02/19/2020  Medication Sig  . acetaminophen (TYLENOL  8 HOUR ARTHRITIS PAIN) 650 MG CR tablet Take 1 tablet (650 mg total) by mouth 2 (two) times daily.  Marland Kitchen amLODipine (NORVASC) 5 MG tablet Take 1 tablet (5 mg total) by mouth daily.  Marland Kitchen gabapentin (NEURONTIN) 100 MG capsule Take 1 capsule (100 mg total) by mouth at bedtime. M79.2  . HYDROcodone-acetaminophen (NORCO/VICODIN) 5-325 MG tablet Take 1/2 tablet by mouth once daily as needed for pain  . losartan (COZAAR) 100 MG tablet TAKE 1 TABLET (100 MG TOTAL) BY MOUTH DAILY.  Marland Kitchen oxybutynin (DITROPAN) 5 MG tablet Take 5 mg by mouth. 1 tablet daily  . pantoprazole (PROTONIX) 40 MG tablet Take 40 mg by mouth daily. 1 tablet daily  . propranolol (INDERAL) 40 MG tablet TAKE 1 TABLET BY MOUTH TWICE A DAY  . [DISCONTINUED] oxybutynin (DITROPAN) 5 MG tablet TAKE 1 TABLET BY MOUTH UP TO 3 TIMES DAILY TO HELP BLADDER CONTROL  . [DISCONTINUED] pantoprazole (PROTONIX) 40 MG tablet Take 1 tablet (40 mg total) by mouth 2 (two) times daily before a meal. (Patient taking differently: Take 40 mg by mouth daily. )   No facility-administered encounter medications on file as of 02/19/2020.    Review of Systems:  Review of Systems  Constitutional: Negative for chills and fever.  Eyes: Negative for blurred vision.  Respiratory: Positive for shortness of breath. Negative for cough, sputum production and wheezing.   Cardiovascular: Negative for chest pain, palpitations, orthopnea, leg swelling and PND.  Gastrointestinal: Negative for abdominal pain and constipation.  Genitourinary: Positive for dysuria.       And groin pain and itching improved  Musculoskeletal: Positive for joint pain. Negative for falls.  Neurological: Positive for tremors and weakness. Negative for loss of consciousness.       Ataxia  Psychiatric/Behavioral: Negative for depression and memory loss. The patient is not nervous/anxious and does not have insomnia.     Health Maintenance  Topic Date Due  . Hepatitis C Screening  Never done  .  TETANUS/TDAP  06/11/2021  . INFLUENZA VACCINE  Completed  . COVID-19 Vaccine  Completed  . PNA vac Low Risk Adult  Completed    Physical Exam: Vitals:   02/19/20 1454  BP: (!) 168/82  Pulse: (!) 59  Temp: 97.7 F (36.5 C)  TempSrc: Temporal  SpO2: 97%  Weight: 165 lb (74.8 kg)  Height: 5\' 7"  (1.702 m)   Body mass index is 25.84 kg/m. Physical Exam Vitals reviewed.  Constitutional:      General: He is not in acute distress.    Appearance: Normal appearance. He is not toxic-appearing.  HENT:     Head: Normocephalic and atraumatic.  Cardiovascular:  Rate and Rhythm: Normal rate and regular rhythm.     Pulses: Normal pulses.     Heart sounds: Normal heart sounds.  Pulmonary:     Effort: Pulmonary effort is normal.     Breath sounds: Normal breath sounds. No wheezing, rhonchi or rales.  Abdominal:     General: Bowel sounds are normal.     Tenderness: There is no abdominal tenderness.  Musculoskeletal:     Cervical back: Neck supple.     Right lower leg: No edema.     Left lower leg: No edema.  Neurological:     Mental Status: He is alert and oriented to person, place, and time.     Motor: Weakness present.     Gait: Gait abnormal.     Comments: Ataxic gait, some ataxic tremor remains in hands when ambulating, ability to stand and use rollator becoming more challenging--caregiver assisting more; continues to have dysarthric speech, with frequent pauses and breaths  Psychiatric:        Mood and Affect: Mood normal.        Behavior: Behavior normal.     Labs reviewed: Basic Metabolic Panel: Recent Labs    10/04/19 0949  NA 140  K 4.5  CL 105  CO2 27  GLUCOSE 95  BUN 18  CREATININE 0.99  CALCIUM 9.5   Liver Function Tests: Recent Labs    10/04/19 0949  AST 14  ALT 17  BILITOT 0.6  PROT 6.3   No results for input(s): LIPASE, AMYLASE in the last 8760 hours. No results for input(s): AMMONIA in the last 8760 hours. CBC: Recent Labs     10/04/19 0949  WBC 7.4  NEUTROABS 4,433  HGB 15.1  HCT 44.5  MCV 91.0  PLT 208   Lipid Panel: Recent Labs    10/04/19 0949  CHOL 191  HDL 41  LDLCALC 110*  TRIG 281*  CHOLHDL 4.7   No results found for: HGBA1C  Procedures since last visit: No results found.  Assessment/Plan 1. Dyspnea on exertion -this is concerning to me and could simply be deconditioning, but I want to be sure it's not due to CAD or reduced EF--needs cardiac workup - Ambulatory referral to Cardiology--he's seen Dr. Acie Fredrickson many years ago, but has not had any cardiac symptoms until recently  2. Encounter for hepatitis C screening test for low risk patient - Hepatitis C antibody; Future  3. 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 Carles Collet -reports test to determine variant is quite costly -he appears to be having more difficulty getting up from a chair than a year ago (these last two visits), but he does report good and bad days  5. Right groin pain -seems this was related to candidal skin infection and improved with nystatin powder and steroid cream per caregiver  6. Oropharyngeal dysphagia -chronic, but stable, due to his ataxia  7. Gastroesophageal reflux disease with esophagitis without hemorrhage -stable recently, doing ok with just daily protonix at this point  Labs/tests ordered:   Orders Placed This Encounter  Procedures  . Flu Vaccine QUAD High Dose(Fluad)  . Hepatitis C antibody    Standing Status:   Future    Standing Expiration Date:   11/18/2020    Order Specific Question:   Release to patient    Answer:   Immediate  . Ambulatory referral to Cardiology    Referral Priority:   Routine  Referral Type:   Consultation    Referral Reason:   Specialty Services Required    Referred to Provider:   Nahser, Wonda Cheng, MD    Requested Specialty:   Cardiology    Number of Visits Requested:   1    Next  appt:  4 mos med mgt/f/u after cardio eval   Piers Baade L. Mivaan Corbitt, D.O. [facility] [address], [place] [address] Cell Phone (Mon-Fri 8am-5pm):  [phone] On Call:  [phone] & follow prompts after 5pm & weekends Office Phone:  [phone] Office Fax:  [phone]

## 2020-02-23 ENCOUNTER — Other Ambulatory Visit: Payer: Self-pay | Admitting: Internal Medicine

## 2020-02-23 DIAGNOSIS — K219 Gastro-esophageal reflux disease without esophagitis: Secondary | ICD-10-CM

## 2020-02-23 NOTE — Telephone Encounter (Signed)
Pharmacy requested refill °Pended Rx and sent to Dr. Reed for approval due to HIGH ALERT Warning.  °

## 2020-02-26 NOTE — Telephone Encounter (Signed)
Ok, let me know when he brings it back.

## 2020-02-26 NOTE — Telephone Encounter (Signed)
Patient is in the office and stated that you wanted him to come in today to fill out a most form, after speaking with him in more detail, I found the notes and it is a most form for his Wife message attached. Percell Miller have  Pamala Hurry in the car and took the most form with him to fill out and bring/mail it back to [place]    [employer],     I can fill in the form for Pamala Hurry as you've decided.  We will need you to sign it after I complete it.  Are you able to come in Monday to do that around mid-day?  We will then make a copy and put it in her chart and you should keep the original on the refrigerator in case of an emergency.    See you soon!    TReed

## 2020-02-29 NOTE — Telephone Encounter (Signed)
He has the form and will fill it out and return it tomorrow, he also stated that Pamala Hurry is doing better since the new care giver came out yesterday

## 2020-03-06 NOTE — Telephone Encounter (Signed)
Justin [name] sent back the form on 03/05/2020 it is on your desk for approval

## 2020-03-28 ENCOUNTER — Other Ambulatory Visit: Payer: Self-pay

## 2020-03-28 ENCOUNTER — Ambulatory Visit: Payer: Medicare Other | Admitting: Cardiovascular Disease

## 2020-03-28 ENCOUNTER — Other Ambulatory Visit: Payer: Self-pay | Admitting: Internal Medicine

## 2020-03-28 ENCOUNTER — Encounter: Payer: Self-pay | Admitting: Cardiovascular Disease

## 2020-03-28 VITALS — BP 148/78 | HR 65 | Ht 67.0 in | Wt 173.4 lb

## 2020-03-28 DIAGNOSIS — I10 Essential (primary) hypertension: Secondary | ICD-10-CM

## 2020-03-28 DIAGNOSIS — R0609 Other forms of dyspnea: Secondary | ICD-10-CM

## 2020-03-28 DIAGNOSIS — R0600 Dyspnea, unspecified: Secondary | ICD-10-CM

## 2020-03-28 MED ORDER — HYDROCHLOROTHIAZIDE 25 MG PO TABS: 25.0000 mg | ORAL_TABLET | Freq: Every day | ORAL | 11 refills | Status: DC

## 2020-03-28 MED ORDER — POTASSIUM CHLORIDE ER 10 MEQ PO TBCR: 10.0000 meq | EXTENDED_RELEASE_TABLET | Freq: Every day | ORAL | 11 refills | Status: DC

## 2020-03-28 NOTE — Progress Notes (Signed)
Cardiology Office Note:    Date:  03/28/2020   ID:  Justin [name], DOB 21-Feb-1941, MRN [id number]  PCP:  Gayland Curry, DO  [place] Cardiologist:  [facility] Electrophysiologist:  None   Referring MD: Gayland Curry, DO   Chief Complaint  Patient presents with  . Shortness of Breath    Oct. 28, 2021   Justin [name] is a 79 y.o. male with a hx of chest pain and shortness of breath We were asked to see him by Dr. Mariea Clonts for further evaluation of his shortness of breath .   Seen with cousin, Fritz Pickerel,  I saw him in  2012 for some chest pain - turned out to be GERD .  Has had some DOE . When he gets up in the morning .  Gets out of breath getting his clothes on.  This dyspnea has been gradually getting worse    Has cerebellar ataxia.   Speech is impaired.  Has balance issues.  Has to hold onto something. Uses a walker around  the house.  Unable to exercise, Rides a stationary bike  30 minutes at a time, several times a week.  BP is a little elevated. Eats salt regularly,  East sausage biscuit every am  Salts all his food    Past Medical History:  Diagnosis Date  . Abnormality of gait September 07 2007  . Allergic rhinitis, cause unspecified 2007  . Arthritis   . Ataxia   . Cervicalgia February 26, 2006  . Chest pain   . Cramp of limb 10/21/11  . Degeneration of intervertebral disc, site unspecified 1998  . 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 Diaphragmatic hernia without mention of obstruction or gangrene 1982   Hiatal hernia  . Diplopia 2006  . Disturbance of skin sensation 2003  . Diverticulosis of colon (without mention of hemorrhage) 1997  . Dizziness and giddiness 2001  . Dysphagia, unspecified(787.20) 1999 and  . Esophagitis, unspecified 1997  . GERD (gastroesophageal reflux disease) 2003  . Hypertension 2003  . Impotence of organic origin 1999  . Lack of coordination September 07, 2007  . Lumbago 2003  .  Migraine with aura, without mention of intractable migraine without mention of status migrainosus 1997  . Myalgia and myositis, unspecified 1999  . Other and unspecified hyperlipidemia 2003  . Other disorders of vitreous 2003  . Other malaise and fatigue 2003  . Other seborrheic keratosis 2013  . Other seborrheic keratosis 04/13/09  . Pain in joint, site unspecified 04/13/12  . Personal history of fall 04/15/11  . Restless legs syndrome (RLS) 1997  . 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 Unspecified hearing loss 2003  . Unspecified tinnitus 04/15/11    Past Surgical History:  Procedure Laterality Date  . APPENDECTOMY  Age 63  . boil removed     Dr. Donne Hazel  . C3-4 anterior cervical surgery  1999   Dr. Hal Neer  . CARDIAC CATHETERIZATION  2001   Dr. Glade Lloyd  . COLONOSCOPY  07/20/07   Dr. Sharlett Iles  . EYE SURGERY Bilateral 2015   cataracts  . LAMINOTOMY / EXCISION DISK POSTERIOR CERVICAL SPINE     Spinal Disk (?4-5)    Current Medications: Current Meds  Medication Sig  . acetaminophen (TYLENOL 8 HOUR ARTHRITIS PAIN) 650 MG CR tablet Take 1 tablet (650 mg total) by mouth 2 (two) times daily.  Marland Kitchen gabapentin (NEURONTIN) 100 MG capsule Take 1 capsule (  100 mg total) by mouth at bedtime. M79.2  . HYDROcodone-acetaminophen (NORCO/VICODIN) 5-325 MG tablet Take 1/2 tablet by mouth once daily as needed for pain  . losartan (COZAAR) 100 MG tablet TAKE 1 TABLET (100 MG TOTAL) BY MOUTH DAILY.  Marland Kitchen oxybutynin (DITROPAN) 5 MG tablet Take 5 mg by mouth. 1 tablet daily  . pantoprazole (PROTONIX) 40 MG tablet TAKE 1 TABLET BY MOUTH EVERY DAY  . propranolol (INDERAL) 40 MG tablet TAKE 1 TABLET BY MOUTH TWICE A DAY  . [DISCONTINUED] amLODipine (NORVASC) 5 MG tablet Take 1 tablet (5 mg total) by mouth daily.     Allergies:   Aciphex [rabeprazole sodium], Effexor [venlafaxine hcl], Serzone [nefazodone], and Vivactil [protriptyline hcl]   Social History   Socioeconomic History   . Marital status: Married    Spouse name: Not on file  . Number of children: 2  . Years of education: Not on file  . Highest education level: Not on file  Occupational History  . Occupation: Real Estate  Tobacco Use  . Smoking status: Never Smoker  . Smokeless tobacco: Former Systems developer    Types: Secondary school teacher  . Vaping Use: Never used  Substance and Sexual Activity  . Alcohol use: Yes    Alcohol/week: 0.0 standard drinks    Comment: rare wine  . Drug use: No  . Sexual activity: Yes  Other Topics Concern  . Not on file  Social History Narrative   Right handed   Drinks caffeine   One story home   Social Determinants of Health   Financial Resource Strain:   . Difficulty of Paying Living Expenses: Not on file  Food Insecurity:   . Worried About Charity fundraiser in the Last Year: Not on file  . Ran Out of Food in the Last Year: Not on file  Transportation Needs:   . Lack of Transportation (Medical): Not on file  . Lack of Transportation (Non-Medical): Not on file  Physical Activity:   . Days of Exercise per Week: Not on file  . Minutes of Exercise per Session: Not on file  Stress:   . Feeling of Stress : Not on file  Social Connections:   . Frequency of Communication with Friends and Family: Not on file  . Frequency of Social Gatherings with Friends and Family: Not on file  . Attends Religious Services: Not on file  . Active Member of Clubs or Organizations: Not on file  . Attends Archivist Meetings: Not on file  . Marital Status: Not on file     Family History: The patient's family history includes Heart disease in his father. There is no history of Diabetes, Colon cancer, Colon polyps, Esophageal cancer, Kidney disease, or Gallbladder disease.  ROS:   Please see the history of present illness.     All other systems reviewed and are negative.  EKGs/Labs/Other Studies Reviewed:    The following studies were reviewed today:   EKG:   March 28, 2020: Normal sinus rhythm at 65.  No ST or T wave changes.  Recent Labs: 10/04/2019: ALT 17; BUN 18; Creat 0.99; Hemoglobin 15.1; Platelets 208; Potassium 4.5; Sodium 140  Recent Lipid Panel    Component Value Date/Time   CHOL 191 10/04/2019 0949   CHOL 196 08/19/2015 0826   TRIG 281 (H) 10/04/2019 0949   HDL 41 10/04/2019 0949   HDL 54 08/19/2015 0826   CHOLHDL 4.7 10/04/2019 0949   VLDL 30 11/26/2016 0531  LDLCALC 110 (H) 10/04/2019 0949     Risk Assessment/Calculations:       Physical Exam:    VS:  BP (!) 148/78   Pulse 65   Ht 5\' 7"  (1.702 m)   Wt 173 lb 6.4 oz (78.7 kg)   SpO2 96%   BMI 27.16 kg/m     Wt Readings from Last 3 Encounters:  03/28/20 173 lb 6.4 oz (78.7 kg)  02/19/20 165 lb (74.8 kg)  01/22/20 165 lb (74.8 kg)     GEN:  Well nourished, well developed in no acute distress HEENT: Normal NECK: No JVD; No carotid bruits LYMPHATICS: No lymphadenopathy CARDIAC: RRR, no murmurs, rubs, gallops RESPIRATORY:  Clear to auscultation without rales, wheezing or rhonchi  ABDOMEN: Soft, non-tender, non-distended MUSCULOSKELETAL:   Mild edema   SKIN: Warm and dry NEUROLOGIC:  Alert and oriented x 3,  Speech is slow.   Has ataxia.    PSYCHIATRIC:  Normal affect   ASSESSMENT:    1. Essential hypertension   2. DOE (dyspnea on exertion)    PLAN:     1. Shortness of breath: Percell Miller presents for further evaluation of shortness of breath.  His blood pressure is mildly elevated.  He eats a very salty diet.  I suspect he has some diastolic dysfunction.  We will get an echocardiogram for further evaluation of his heart function.  We will start him on HCTZ 25 mg a day and potassium chloride 10 mEq a day.  Given him information on the lounge doctor leg rest.  I have advised him to greatly decrease his salt intake.  I will see him in 5 or 6 months for follow-up visit.   Medication Adjustments/Labs and Tests Ordered: Current medicines are reviewed at length  with the patient today.  Concerns regarding medicines are outlined above.  Orders Placed This Encounter  Procedures  . Basic Metabolic Panel (BMET)  . EKG 12-Lead  . ECHOCARDIOGRAM COMPLETE   Meds ordered this encounter  Medications  . hydrochlorothiazide (HYDRODIURIL) 25 MG tablet    Sig: Take 1 tablet (25 mg total) by mouth daily.    Dispense:  30 tablet    Refill:  11  . potassium chloride (KLOR-CON) 10 MEQ tablet    Sig: Take 1 tablet (10 mEq total) by mouth daily.    Dispense:  30 tablet    Refill:  11    Patient Instructions  Medication Instructions:  Your physician has recommended you make the following change in your medication:  START HCTZ (Hydrochlorothiazide) 25 mg once daily in the morning START Kdur (Potassium chloride) 10 mEq once daily in the morning  *If you need a refill on your cardiac medications before your next appointment, please call your pharmacy*   Lab Work: Your physician recommends that you return for lab work in: 3 weeks for basic metabolic panel You do not have to fast for this appointment  If you have labs (blood work) drawn today and your tests are completely normal, you will receive your results only by: Marland Kitchen MyChart Message (if you have MyChart) OR . A paper copy in the mail If you have any lab test that is abnormal or we need to change your treatment, we will call you to review the results.   Testing/Procedures: Your physician has requested that you have an echocardiogram. Echocardiography is a painless test that uses sound waves to create images of your heart. It provides your doctor with information about the size and shape of  your heart and how well your heart's chambers and valves are working. This procedure takes approximately one hour. There are no restrictions for this procedure.    Follow-Up: At [facility], you and your health needs are our priority.  As part of our continuing mission to provide you with exceptional heart care, we  have created designated Provider Care Teams.  These Care Teams include your primary Cardiologist (physician) and Advanced Practice Providers (APPs -  Physician Assistants and Nurse Practitioners) who all work together to provide you with the care you need, when you need it.   Your next appointment:   5 month(s)  The format for your next appointment:   In Person  Provider:   You may see Mertie Moores, MD or one of the following Advanced Practice Providers on your designated Care Team:    Richardson Dopp, PA-C  [place], [place]    Other Instructions For your  leg edema you  should do  the following 1. Leg elevation - I recommend the Lounge Dr. Leg rest.  See below for details  2. Salt restriction  -  Use potassium chloride instead of regular salt as a salt substitute. 3. Walk regularly 4. Compression hose - [facility] 5. Weight loss    Available on [email or web address] Or  Go to [email or web address]         Signed, Mertie Moores, MD  03/28/2020 6:15 PM    [facility]

## 2020-03-28 NOTE — Patient Instructions (Addendum)
Medication Instructions:  Your physician has recommended you make the following change in your medication:  START HCTZ (Hydrochlorothiazide) 25 mg once daily in the morning START Kdur (Potassium chloride) 10 mEq once daily in the morning  *If you need a refill on your cardiac medications before your next appointment, please call your pharmacy*   Lab Work: Your physician recommends that you return for lab work in: 3 weeks for basic metabolic panel You do not have to fast for this appointment  If you have labs (blood work) drawn today and your tests are completely normal, you will receive your results only by: Marland Kitchen MyChart Message (if you have MyChart) OR . A paper copy in the mail If you have any lab test that is abnormal or we need to change your treatment, we will call you to review the results.   Testing/Procedures: Your physician has requested that you have an echocardiogram. Echocardiography is a painless test that uses sound waves to create images of your heart. It provides your doctor with information about the size and shape of your heart and how well your heart's chambers and valves are working. This procedure takes approximately one hour. There are no restrictions for this procedure.    Follow-Up: At [facility], you and your health needs are our priority.  As part of our continuing mission to provide you with exceptional heart care, we have created designated Provider Care Teams.  These Care Teams include your primary Cardiologist (physician) and Advanced Practice Providers (APPs -  Physician Assistants and Nurse Practitioners) who all work together to provide you with the care you need, when you need it.   Your next appointment:   5 month(s)  The format for your next appointment:   In Person  Provider:   You may see Mertie Moores, MD or one of the following Advanced Practice Providers on your designated Care Team:    Richardson Dopp, PA-C  [place], [place]    Other  Instructions For your  leg edema you  should do  the following 1. Leg elevation - I recommend the Lounge Dr. Leg rest.  See below for details  2. Salt restriction  -  Use potassium chloride instead of regular salt as a salt substitute. 3. Walk regularly 4. Compression hose - [facility] 5. Weight loss    Available on [email or web address] Or  Go to [email or web address]

## 2020-04-19 ENCOUNTER — Ambulatory Visit (HOSPITAL_COMMUNITY): Payer: Medicare Other | Attending: Internal Medicine

## 2020-04-19 ENCOUNTER — Other Ambulatory Visit: Payer: Medicare Other | Admitting: *Deleted

## 2020-04-19 ENCOUNTER — Other Ambulatory Visit: Payer: Self-pay

## 2020-04-19 DIAGNOSIS — R0600 Dyspnea, unspecified: Secondary | ICD-10-CM

## 2020-04-19 DIAGNOSIS — I10 Essential (primary) hypertension: Secondary | ICD-10-CM | POA: Diagnosis not present

## 2020-04-19 DIAGNOSIS — R0609 Other forms of dyspnea: Secondary | ICD-10-CM

## 2020-04-19 LAB — ECHOCARDIOGRAM COMPLETE
Area-P 1/2: 3.94 cm2
S' Lateral: 2 cm

## 2020-04-19 LAB — BASIC METABOLIC PANEL
BUN/Creatinine Ratio: 18 (ref 10–24)
BUN: 19 mg/dL (ref 8–27)
CO2: 25 mmol/L (ref 20–29)
Calcium: 9.4 mg/dL (ref 8.6–10.2)
Chloride: 101 mmol/L (ref 96–106)
Creatinine, Ser: 1.04 mg/dL (ref 0.76–1.27)
GFR calc Af Amer: 79 mL/min/{1.73_m2} (ref >59)
GFR calc non Af Amer: 68 mL/min/{1.73_m2} (ref >59)
Glucose: 78 mg/dL (ref 65–99)
Potassium: 4.1 mmol/L (ref 3.5–5.2)
Sodium: 139 mmol/L (ref 134–144)

## 2020-06-20 ENCOUNTER — Ambulatory Visit (INDEPENDENT_AMBULATORY_CARE_PROVIDER_SITE_OTHER): Payer: Medicare Other | Admitting: Internal Medicine

## 2020-06-20 ENCOUNTER — Encounter: Payer: Self-pay | Admitting: Internal Medicine

## 2020-06-20 ENCOUNTER — Other Ambulatory Visit: Payer: Self-pay

## 2020-06-20 VITALS — BP 138/84 | HR 66 | Temp 97.5°F | Ht 67.0 in | Wt 173.8 lb

## 2020-06-20 DIAGNOSIS — G119 Hereditary ataxia, unspecified: Secondary | ICD-10-CM

## 2020-06-20 DIAGNOSIS — I7102 Dissection of abdominal aorta: Secondary | ICD-10-CM | POA: Diagnosis not present

## 2020-06-20 DIAGNOSIS — K2100 Gastro-esophageal reflux disease with esophagitis, without bleeding: Secondary | ICD-10-CM | POA: Diagnosis not present

## 2020-06-20 DIAGNOSIS — E7800 Pure hypercholesterolemia, unspecified: Secondary | ICD-10-CM

## 2020-06-20 DIAGNOSIS — R609 Edema, unspecified: Secondary | ICD-10-CM

## 2020-06-20 MED ORDER — ESOMEPRAZOLE MAGNESIUM 20 MG PO CPDR
20.0000 mg | DELAYED_RELEASE_CAPSULE | Freq: Two times a day (BID) | ORAL | 3 refills | Status: DC
Start: 2020-06-20 — End: 2020-12-20

## 2020-06-20 NOTE — Progress Notes (Signed)
Location:  [facility] clinic Provider:  Tameah Mihalko L. Mariea Clonts, D.O., C.M.D.  Code Status: DNR Goals of Care:  Advanced Directives 06/20/2020  Does Patient Have a Medical Advance Directive? Yes  Type of Advance Directive -  Does patient want to make changes to medical advance directive? No - Patient declined  Copy of [place] in Chart? -  Would patient like information on creating a medical advance directive? -  Pre-existing out of facility DNR order (yellow form or pink MOST form) -     Chief Complaint  Patient presents with  . Medical Management of Chronic Issues    4 month follow up. Discuss changing reflux medication     HPI: Patient is a 80 y.o. male seen today for medical management of chronic diseases.    They were married 55 yrs.  He's happy she passed away at home.   She was as peaceful as she could be and went in her sleep.  They have not figured out a new routine w/o Barbara.    His reflux is "off the grid sometimes".  He's taking the nexium and the protonix.  Says the nexium seems to help more.  No pain in his throat or difficulty swallowing.    Tremor part seems a little better after switching to a morning and night medicine.    Bladder control has been good with 1 oxybutynin each am.  Says he must go when he gets a signal though.  Says it constipates him if he uses more.    Maybe one headache a month lately. Once in a while, he gets a numb area on his right shin.  It's like it turns on all of a sudden.  If he moves his leg different, it quits.    He had been eating sausage and was told not to eat that.  He's had to cut that out.   Past Medical History:  Diagnosis Date  . Abnormality of gait September 07 2007  . Allergic rhinitis, cause unspecified 2007  . Arthritis   . Ataxia   . Cervicalgia February 26, 2006  . Chest pain   . Cramp of limb 10/21/11  . Degeneration of intervertebral disc, site unspecified 1998  . 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 Diaphragmatic hernia without mention of obstruction or gangrene 1982   Hiatal hernia  . Diplopia 2006  . Disturbance of skin sensation 2003  . Diverticulosis of colon (without mention of hemorrhage) 1997  . Dizziness and giddiness 2001  . Dysphagia, unspecified(787.20) 1999 and  . Esophagitis, unspecified 1997  . GERD (gastroesophageal reflux disease) 2003  . Hypertension 2003  . Impotence of organic origin 1999  . Lack of coordination September 07, 2007  . Lumbago 2003  . Migraine with aura, without mention of intractable migraine without mention of status migrainosus 1997  . Myalgia and myositis, unspecified 1999  . Other and unspecified hyperlipidemia 2003  . Other disorders of vitreous 2003  . Other malaise and fatigue 2003  . Other seborrheic keratosis 2013  . Other seborrheic keratosis 04/13/09  . Pain in joint, site unspecified 04/13/12  . Personal history of fall 04/15/11  . Restless legs syndrome (RLS) 1997  . 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 Unspecified hearing loss 2003  . Unspecified tinnitus 04/15/11    Past Surgical History:  Procedure Laterality Date  . APPENDECTOMY  Age 10  . boil removed     Dr. Donne Hazel  .  C3-4 anterior cervical surgery  1999   Dr. Hal Neer  . CARDIAC CATHETERIZATION  2001   Dr. Glade Lloyd  . COLONOSCOPY  07/20/07   Dr. Sharlett Iles  . EYE SURGERY Bilateral 2015   cataracts  . LAMINOTOMY / EXCISION DISK POSTERIOR CERVICAL SPINE     Spinal Disk (?4-5)    Allergies  Allergen Reactions  . Aciphex [Rabeprazole Sodium]   . Effexor [Venlafaxine Hcl]   . Serzone [Nefazodone]   . Vivactil [Protriptyline Hcl]     Outpatient Encounter Medications as of 06/20/2020  Medication Sig  . acetaminophen (TYLENOL 8 HOUR ARTHRITIS PAIN) 650 MG CR tablet Take 1 tablet (650 mg total) by mouth 2 (two) times daily.  Marland Kitchen amLODipine (NORVASC) 5 MG tablet TAKE 1 TABLET BY MOUTH EVERY DAY  .  gabapentin (NEURONTIN) 100 MG capsule Take 1 capsule (100 mg total) by mouth at bedtime. M79.2  . hydrochlorothiazide (HYDRODIURIL) 25 MG tablet Take 1 tablet (25 mg total) by mouth daily.  Marland Kitchen HYDROcodone-acetaminophen (NORCO/VICODIN) 5-325 MG tablet Take 1/2 tablet by mouth once daily as needed for pain  . losartan (COZAAR) 100 MG tablet TAKE 1 TABLET (100 MG TOTAL) BY MOUTH DAILY.  Marland Kitchen oxybutynin (DITROPAN) 5 MG tablet Take 5 mg by mouth. 1 tablet daily  . pantoprazole (PROTONIX) 40 MG tablet TAKE 1 TABLET BY MOUTH EVERY DAY  . potassium chloride (KLOR-CON) 10 MEQ tablet Take 1 tablet (10 mEq total) by mouth daily.  . propranolol (INDERAL) 40 MG tablet TAKE 1 TABLET BY MOUTH TWICE A DAY   No facility-administered encounter medications on file as of 06/20/2020.    Review of Systems:  Review of Systems  Constitutional: Negative for chills, fever and malaise/fatigue.  HENT: Negative for congestion and sore throat.   Eyes: Negative for blurred vision.       Glasses  Respiratory: Negative for cough and shortness of breath.   Cardiovascular: Negative for chest pain, palpitations and leg swelling.  Gastrointestinal: Positive for heartburn. Negative for abdominal pain, constipation, diarrhea, nausea and vomiting.  Genitourinary: Negative for dysuria.  Musculoskeletal: Negative for falls.       Unsteady gait, uses rollator walker  Skin: Negative for itching and rash.  Neurological: Positive for speech change. Negative for dizziness, sensory change and loss of consciousness.       Unsteady gait, uses rollator and caregiver helps stabilize him when he gets up from a chair  Psychiatric/Behavioral: Negative for memory loss. The patient is not nervous/anxious and does not have insomnia.        Grieving loss of his wife    Health Maintenance  Topic Date Due  . Hepatitis C Screening  Never done  . COVID-19 Vaccine (4 - Booster for Pfizer series) 10/10/2020  . TETANUS/TDAP  06/11/2021  . INFLUENZA  VACCINE  Completed  . PNA vac Low Risk Adult  Completed    Physical Exam: Vitals:   06/20/20 1456  BP: 138/84  Pulse: 66  Temp: (!) 97.5 F (36.4 C)  SpO2: 94%  Weight: 173 lb 12.8 oz (78.8 kg)  Height: 5\' 7"  (1.702 m)   Body mass index is 27.22 kg/m. Physical Exam Vitals reviewed.  Constitutional:      General: He is not in acute distress.    Appearance: Normal appearance. He is not toxic-appearing.  HENT:     Head: Normocephalic and atraumatic.  Eyes:     Conjunctiva/sclera: Conjunctivae normal.     Pupils: Pupils are equal, round, and reactive to light.  Comments: glasses  Cardiovascular:     Rate and Rhythm: Normal rate and regular rhythm.     Pulses: Normal pulses.     Heart sounds: Normal heart sounds.  Pulmonary:     Effort: Pulmonary effort is normal.     Breath sounds: Normal breath sounds. No wheezing, rhonchi or rales.  Abdominal:     General: Bowel sounds are normal.     Tenderness: There is no abdominal tenderness.  Musculoskeletal:        General: Normal range of motion.     Right lower leg: No edema.     Left lower leg: No edema.  Skin:    General: Skin is warm and dry.  Neurological:     Mental Status: He is alert and oriented to person, place, and time.     Comments: Dysarthric speech, some tremor with ambulation, uses rollator due to unsteadiness  Psychiatric:        Mood and Affect: Mood normal.        Behavior: Behavior normal.     Labs reviewed: Basic Metabolic Panel: Recent Labs    10/04/19 0949 04/19/20 1037  NA 140 139  K 4.5 4.1  CL 105 101  CO2 27 25  GLUCOSE 95 78  BUN 18 19  CREATININE 0.99 1.04  CALCIUM 9.5 9.4   Liver Function Tests: Recent Labs    10/04/19 0949  AST 14  ALT 17  BILITOT 0.6  PROT 6.3   No results for input(s): LIPASE, AMYLASE in the last 8760 hours. No results for input(s): AMMONIA in the last 8760 hours. CBC: Recent Labs    10/04/19 0949  WBC 7.4  NEUTROABS 4,433  HGB 15.1  HCT  44.5  MCV 91.0  PLT 208   Lipid Panel: Recent Labs    10/04/19 0949  CHOL 191  HDL 41  LDLCALC 110*  TRIG 281*  CHOLHDL 4.7   No results found for: HGBA1C  Procedures since last visit: No results found.  Assessment/Plan 1. Gastroesophageal reflux disease with esophagitis without hemorrhage -we'll stop his protonix and try nexium b/c he's using it prn on his own and notes benefit - esomeprazole (NEXIUM) 20 MG capsule; Take 1 capsule (20 mg total) by mouth 2 (two) times daily before a meal.  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 Dissection of abdominal aorta (HCC) - historically, hgb stable and no further abdominal pain spells - CBC with Differential/Platelet; Future  4. Edema, unspecified type - mild, elevate feet at rest, avoid high sodium foods, compression socks recommended - COMPLETE METABOLIC PANEL WITH GFR; Future  5. Pure hypercholesterolemia - f/u labs, not on tx - CBC with Differential/Platelet; Future - COMPLETE METABOLIC PANEL WITH GFR; Future - Lipid panel; Future   Labs/tests ordered:   Lab Orders     CBC with Differential/Platelet     COMPLETE METABOLIC PANEL WITH GFR     Lipid panel  Next appt:  12/16/2020  Aiyana Stegmann L. Jackqulyn Mendel, D.O. [facility] [address],  [address] Cell Phone (Mon-Fri 8am-5pm):  [phone] On Call:  [phone] & follow prompts after 5pm & weekends Office Phone:  [phone] Office Fax:  [phone]

## 2020-06-29 ENCOUNTER — Other Ambulatory Visit: Payer: Self-pay | Admitting: Internal Medicine

## 2020-06-29 DIAGNOSIS — R270 Ataxia, unspecified: Secondary | ICD-10-CM

## 2020-06-29 DIAGNOSIS — G119 Hereditary ataxia, unspecified: Secondary | ICD-10-CM

## 2020-07-01 DIAGNOSIS — H16221 Keratoconjunctivitis sicca, not specified as Sjogren's, right eye: Secondary | ICD-10-CM | POA: Diagnosis not present

## 2020-07-01 DIAGNOSIS — H5203 Hypermetropia, bilateral: Secondary | ICD-10-CM | POA: Diagnosis not present

## 2020-07-01 DIAGNOSIS — H26492 Other secondary cataract, left eye: Secondary | ICD-10-CM | POA: Diagnosis not present

## 2020-07-01 DIAGNOSIS — H47021 Hemorrhage in optic nerve sheath, right eye: Secondary | ICD-10-CM | POA: Diagnosis not present

## 2020-07-04 ENCOUNTER — Telehealth: Payer: Self-pay | Admitting: Internal Medicine

## 2020-07-04 NOTE — Telephone Encounter (Signed)
I called the patient to schedule an AWV and patient did not want to schedule right now.

## 2020-07-22 ENCOUNTER — Encounter: Payer: Self-pay | Admitting: Internal Medicine

## 2020-07-27 ENCOUNTER — Other Ambulatory Visit: Payer: Self-pay | Admitting: Internal Medicine

## 2020-07-27 DIAGNOSIS — M792 Neuralgia and neuritis, unspecified: Secondary | ICD-10-CM

## 2020-07-29 ENCOUNTER — Encounter: Payer: Self-pay | Admitting: Internal Medicine

## 2020-07-29 NOTE — Telephone Encounter (Signed)
Please call his pharmacy and find out why gabapentin was expensive.  I doubt that it's not on formulary--it's an old drug and should be fairly cheap.  He may actually have a deductible to meet or something so they should be able to tell us the whole story.

## 2020-07-29 NOTE — Telephone Encounter (Signed)
Pharmacy is requesting an alternative to this medication says it's not covered on patients insurance plan

## 2020-07-30 NOTE — Telephone Encounter (Signed)
I called the patient to tell him what Dr. Mariea Clonts had said and that she had sent a new prescription to his pharmacy for him but I got no answer I will try to call again

## 2020-07-30 NOTE — Telephone Encounter (Signed)
Apparently, the other messages didn't go through below.  Anyway, please notify Justin [name] that his insurance has lyrica on level 2 which should be more affordable than gabapentin so I've ordered it instead.  He should call if he has any problems taking the new prescription as ordered.

## 2020-07-31 NOTE — Telephone Encounter (Signed)
I have tried to reach the patient today I got answering machine message left for patient to call office.

## 2020-07-31 NOTE — Telephone Encounter (Signed)
Patients wife called with patient also present explained that Dr. Mariea Clonts had sent a new prescription for him to pharmacy

## 2020-08-12 ENCOUNTER — Ambulatory Visit (INDEPENDENT_AMBULATORY_CARE_PROVIDER_SITE_OTHER): Payer: Medicare Other | Admitting: Nurse Practitioner

## 2020-08-12 ENCOUNTER — Encounter: Payer: Self-pay | Admitting: Nurse Practitioner

## 2020-08-12 ENCOUNTER — Other Ambulatory Visit: Payer: Self-pay

## 2020-08-12 ENCOUNTER — Telehealth: Payer: Self-pay

## 2020-08-12 DIAGNOSIS — Z0000 Encounter for general adult medical examination without abnormal findings: Secondary | ICD-10-CM | POA: Diagnosis not present

## 2020-08-12 NOTE — Patient Instructions (Signed)
Justin [name] , Thank you for taking time to come for your Medicare Wellness Visit. I appreciate your ongoing commitment to your health goals. Please review the following plan we discussed and let me know if I can assist you in the future.   Screening recommendations/referrals: Colonoscopy aged out Recommended yearly ophthalmology/optometry visit for glaucoma screening and checkup Recommended yearly dental visit for hygiene and checkup  Vaccinations: Influenza vaccine up to date Pneumococcal vaccine up to date Tdap vaccine up to date Shingles vaccine Shingrix vaccine due     Advanced directives: on file.   Conditions/risks identified: fall risk, advance age, hypertension, hyperlipidemia  Next appointment: 1 year  Preventive Care 54 Years and Older, Male Preventive care refers to lifestyle choices and visits with your health care provider that can promote health and wellness. What does preventive care include?  A yearly physical exam. This is also called an annual well check.  Dental exams once or twice a year.  Routine eye exams. Ask your health care provider how often you should have your eyes checked.  Personal lifestyle choices, including:  Daily care of your teeth and gums.  Regular physical activity.  Eating a healthy diet.  Avoiding tobacco and drug use.  Limiting alcohol use.  Practicing safe sex.  Taking low doses of aspirin every day.  Taking vitamin and mineral supplements as recommended by your health care provider. What happens during an annual well check? The services and screenings done by your health care provider during your annual well check will depend on your age, overall health, lifestyle risk factors, and family history of disease. Counseling  Your health care provider may ask you questions about your:  Alcohol use.  Tobacco use.  Drug use.  Emotional well-being.  Home and relationship well-being.  Sexual activity.  Eating  habits.  History of falls.  Memory and ability to understand (cognition).  Work and work Statistician. Screening  You may have the following tests or measurements:  Height, weight, and BMI.  Blood pressure.  Lipid and cholesterol levels. These may be checked every 5 years, or more frequently if you are over 13 years old.  Skin check.  Lung cancer screening. You may have this screening every year starting at age 67 if you have a 30-pack-year history of smoking and currently smoke or have quit within the past 15 years.  Fecal occult blood test (FOBT) of the stool. You may have this test every year starting at age 36.  Flexible sigmoidoscopy or colonoscopy. You may have a sigmoidoscopy every 5 years or a colonoscopy every 10 years starting at age 46.  Prostate cancer screening. Recommendations will vary depending on your family history and other risks.  Hepatitis C blood test.  Hepatitis B blood test.  Sexually transmitted disease (STD) testing.  Diabetes screening. This is done by checking your blood sugar (glucose) after you have not eaten for a while (fasting). You may have this done every 1-3 years.  Abdominal aortic aneurysm (AAA) screening. You may need this if you are a current or former smoker.  Osteoporosis. You may be screened starting at age 41 if you are at high risk. Talk with your health care provider about your test results, treatment options, and if necessary, the need for more tests. Vaccines  Your health care provider may recommend certain vaccines, such as:  Influenza vaccine. This is recommended every year.  Tetanus, diphtheria, and acellular pertussis (Tdap, Td) vaccine. You may need a Td booster every 10 years.  Zoster vaccine. You may need this after age 61.  Pneumococcal 13-valent conjugate (PCV13) vaccine. One dose is recommended after age 41.  Pneumococcal polysaccharide (PPSV23) vaccine. One dose is recommended after age 64. Talk to your health  care provider about which screenings and vaccines you need and how often you need them. This information is not intended to replace advice given to you by your health care provider. Make sure you discuss any questions you have with your health care provider. Document Released: 06/14/2015 Document Revised: 02/05/2016 Document Reviewed: 03/19/2015 Elsevier Interactive Patient Education  2017 [place] Prevention in the Home Falls can cause injuries. They can happen to people of all ages. There are many things you can do to make your home safe and to help prevent falls. What can I do on the outside of my home?  Regularly fix the edges of walkways and driveways and fix any cracks.  Remove anything that might make you trip as you walk through a door, such as a raised step or threshold.  Trim any bushes or trees on the path to your home.  Use bright outdoor lighting.  Clear any walking paths of anything that might make someone trip, such as rocks or tools.  Regularly check to see if handrails are loose or broken. Make sure that both sides of any steps have handrails.  Any raised decks and porches should have guardrails on the edges.  Have any leaves, snow, or ice cleared regularly.  Use sand or salt on walking paths during winter.  Clean up any spills in your garage right away. This includes oil or grease spills. What can I do in the bathroom?  Use night lights.  Install grab bars by the toilet and in the tub and shower. Do not use towel bars as grab bars.  Use non-skid mats or decals in the tub or shower.  If you need to sit down in the shower, use a plastic, non-slip stool.  Keep the floor dry. Clean up any water that spills on the floor as soon as it happens.  Remove soap buildup in the tub or shower regularly.  Attach bath mats securely with double-sided non-slip rug tape.  Do not have throw rugs and other things on the floor that can make you trip. What can I do  in the bedroom?  Use night lights.  Make sure that you have a light by your bed that is easy to reach.  Do not use any sheets or blankets that are too big for your bed. They should not hang down onto the floor.  Have a firm chair that has side arms. You can use this for support while you get dressed.  Do not have throw rugs and other things on the floor that can make you trip. What can I do in the kitchen?  Clean up any spills right away.  Avoid walking on wet floors.  Keep items that you use a lot in easy-to-reach places.  If you need to reach something above you, use a strong step stool that has a grab bar.  Keep electrical cords out of the way.  Do not use floor polish or wax that makes floors slippery. If you must use wax, use non-skid floor wax.  Do not have throw rugs and other things on the floor that can make you trip. What can I do with my stairs?  Do not leave any items on the stairs.  Make sure that there are  handrails on both sides of the stairs and use them. Fix handrails that are broken or loose. Make sure that handrails are as long as the stairways.  Check any carpeting to make sure that it is firmly attached to the stairs. Fix any carpet that is loose or worn.  Avoid having throw rugs at the top or bottom of the stairs. If you do have throw rugs, attach them to the floor with carpet tape.  Make sure that you have a light switch at the top of the stairs and the bottom of the stairs. If you do not have them, ask someone to add them for you. What else can I do to help prevent falls?  Wear shoes that:  Do not have high heels.  Have rubber bottoms.  Are comfortable and fit you well.  Are closed at the toe. Do not wear sandals.  If you use a stepladder:  Make sure that it is fully opened. Do not climb a closed stepladder.  Make sure that both sides of the stepladder are locked into place.  Ask someone to hold it for you, if possible.  Clearly mark  and make sure that you can see:  Any grab bars or handrails.  First and last steps.  Where the edge of each step is.  Use tools that help you move around (mobility aids) if they are needed. These include:  Canes.  Walkers.  Scooters.  Crutches.  Turn on the lights when you go into a dark area. Replace any light bulbs as soon as they burn out.  Set up your furniture so you have a clear path. Avoid moving your furniture around.  If any of your floors are uneven, fix them.  If there are any pets around you, be aware of where they are.  Review your medicines with your doctor. Some medicines can make you feel dizzy. This can increase your chance of falling. Ask your doctor what other things that you can do to help prevent falls. This information is not intended to replace advice given to you by your health care provider. Make sure you discuss any questions you have with your health care provider. Document Released: 03/14/2009 Document Revised: 10/24/2015 Document Reviewed: 06/22/2014 Elsevier Interactive Patient Education  2017 [employer].

## 2020-08-12 NOTE — Progress Notes (Signed)
   This service is provided via telemedicine  No vital signs collected/recorded due to the encounter was a telemedicine visit.   Location of patient (ex: home, work):  Home  Patient consents to a telephone visit: Yes, see telephone visit dated 08/12/2020  Location of the provider (ex: office, home):  [facility], Office   Name of any referring provider:  N/A  Names of all persons participating in the telemedicine service and their role in the encounter:  S.Chrae B/CMA, Sherrie Mustache, NP, and Kenney Houseman (caregiver), and patient   Time spent on call:  9 min with medical assistant

## 2020-08-12 NOTE — Telephone Encounter (Signed)
Mr. forbes, loll are scheduled for a virtual visit with your provider today.    Just as we do with appointments in the office, we must obtain your consent to participate.  Your consent will be active for this visit and any virtual visit you may have with one of our providers in the next 365 days.    If you have a MyChart account, I can also send a copy of this consent to you electronically.  All virtual visits are billed to your insurance company just like a traditional visit in the office.  As this is a virtual visit, video technology does not allow for your provider to perform a traditional examination.  This may limit your provider's ability to fully assess your condition.  If your provider identifies any concerns that need to be evaluated in person or the need to arrange testing such as labs, EKG, etc, we will make arrangements to do so.    Although advances in technology are sophisticated, we cannot ensure that it will always work on either your end or our end.  If the connection with a video visit is poor, we may have to switch to a telephone visit.  With either a video or telephone visit, we are not always able to ensure that we have a secure connection.   I need to obtain your verbal consent now.   Are you willing to proceed with your visit today?   [name] and Caregiver Kenney Houseman  has provided verbal consent on 08/12/2020 for a virtual visit (video or telephone).   Leigh Aurora [place], [place] 08/12/2020  4:07 PM

## 2020-08-12 NOTE — Progress Notes (Signed)
Subjective:   Justin [name] is a 80 y.o. male who presents for Medicare Annual/Subsequent preventive examination.  Review of Systems     Cardiac Risk Factors include: hypertension;dyslipidemia;male gender;sedentary lifestyle;advanced age (>68men, >29 women)     Objective:    There were no vitals filed for this visit. There is no height or weight on file to calculate BMI.  Advanced Directives 08/12/2020 06/20/2020 02/19/2020 01/22/2020 11/16/2019 11/07/2019 10/09/2019  Does Patient Have a Medical Advance Directive? Yes Yes Yes Yes - Yes Yes  Type of Advance Directive Out of facility DNR (pink MOST or yellow form) - Out of facility DNR (pink MOST or yellow form) [place];Living will Out of facility DNR (pink MOST or yellow form) - Out of facility DNR (pink MOST or yellow form)  Does patient want to make changes to medical advance directive? No - Patient declined No - Patient declined No - Patient declined - No - Patient declined - No - Patient declined  Copy of [place] in Chart? - - - - - - -  Would patient like information on creating a medical advance directive? - - - - - - -  Pre-existing out of facility DNR order (yellow form or pink MOST form) - - - - - - -    Current Medications (verified) Outpatient Encounter Medications as of 08/12/2020  Medication Sig  . acetaminophen (TYLENOL 8 HOUR ARTHRITIS PAIN) 650 MG CR tablet Take 1 tablet (650 mg total) by mouth 2 (two) times daily.  Marland Kitchen amLODipine (NORVASC) 5 MG tablet TAKE 1 TABLET BY MOUTH EVERY DAY  . esomeprazole (NEXIUM) 20 MG capsule Take 1 capsule (20 mg total) by mouth 2 (two) times daily before a meal.  . hydrochlorothiazide (HYDRODIURIL) 25 MG tablet Take 1 tablet (25 mg total) by mouth daily.  Marland Kitchen HYDROcodone-acetaminophen (NORCO/VICODIN) 5-325 MG tablet Take 1/2 tablet by mouth once daily as needed for pain  . losartan (COZAAR) 100 MG tablet TAKE 1 TABLET (100 MG TOTAL) BY MOUTH DAILY.  Marland Kitchen  oxybutynin (DITROPAN) 5 MG tablet Take 5 mg by mouth. 1 tablet daily  . potassium chloride (KLOR-CON) 10 MEQ tablet Take 1 tablet (10 mEq total) by mouth daily.  . pregabalin (LYRICA) 25 MG capsule Take 1 capsule (25 mg total) by mouth 3 (three) times daily.  . propranolol (INDERAL) 40 MG tablet TAKE 1 TABLET BY MOUTH TWICE A DAY   No facility-administered encounter medications on file as of 08/12/2020.    Allergies (verified) Aciphex [rabeprazole sodium], Effexor [venlafaxine hcl], Serzone [nefazodone], and Vivactil [protriptyline hcl]   History: Past Medical History:  Diagnosis Date  . Abnormality of gait September 07 2007  . Allergic rhinitis, cause unspecified 2007  . Arthritis   . Ataxia   . Cervicalgia February 26, 2006  . Chest pain   . Cramp of limb 10/21/11  . Degeneration of intervertebral disc, site unspecified 1998  . 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 Diaphragmatic hernia without mention of obstruction or gangrene 1982   Hiatal hernia  . Diplopia 2006  . Disturbance of skin sensation 2003  . Diverticulosis of colon (without mention of hemorrhage) 1997  . Dizziness and giddiness 2001  . Dysphagia, unspecified(787.20) 1999 and  . Esophagitis, unspecified 1997  . GERD (gastroesophageal reflux disease) 2003  . Hypertension 2003  . Impotence of organic origin 1999  . Lack of coordination September 07, 2007  . Lumbago 2003  . 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 Myalgia and myositis, unspecified 1999  . Other and unspecified hyperlipidemia 2003  . Other disorders of vitreous 2003  . Other malaise and fatigue 2003  . Other seborrheic keratosis 2013  . Other seborrheic keratosis 04/13/09  . Pain in joint, site unspecified 04/13/12  . Personal history of fall 04/15/11  . Restless legs syndrome (RLS) 1997  . 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  Unspecified hearing loss 2003  . Unspecified tinnitus 04/15/11   Past Surgical History:  Procedure Laterality Date  . APPENDECTOMY  Age 43  . boil removed     Dr. Donne Hazel  . C3-4 anterior cervical surgery  1999   Dr. Hal Neer  . CARDIAC CATHETERIZATION  2001   Dr. Glade Lloyd  . COLONOSCOPY  07/20/07   Dr. Sharlett Iles  . EYE SURGERY Bilateral 2015   cataracts  . LAMINOTOMY / EXCISION DISK POSTERIOR CERVICAL SPINE     Spinal Disk (?4-5)   Family History  Problem Relation Age of Onset  . Heart disease Father        SCA with multiple family members with SCA  . Diabetes Neg Hx   . Colon cancer Neg Hx   . Colon polyps Neg Hx   . Esophageal cancer Neg Hx   . Kidney disease Neg Hx   . Gallbladder disease Neg Hx    Social History   Socioeconomic History  . Marital status: Married    Spouse name: Not on file  . Number of children: 2  . Years of education: Not on file  . Highest education level: Not on file  Occupational History  . Occupation: Real Estate  Tobacco Use  . Smoking status: Never Smoker  . Smokeless tobacco: Former Systems developer    Types: Secondary school teacher  . Vaping Use: Never used  Substance and Sexual Activity  . Alcohol use: Yes    Alcohol/week: 0.0 standard drinks    Comment: rare wine  . Drug use: No  . Sexual activity: Yes  Other Topics Concern  . Not on file  Social History Narrative   Right handed   Drinks caffeine   One story home   Social Determinants of Health   Financial Resource Strain: Not on file  Food Insecurity: Not on file  Transportation Needs: Not on file  Physical Activity: Not on file  Stress: Not on file  Social Connections: Not on file    Tobacco Counseling Counseling given: Not Answered   Clinical Intake:  Pre-visit preparation completed: Yes  Pain : No/denies pain     BMI - recorded: 27 Nutritional Risks: None Diabetes: No  How often do you need to have someone help you when you read instructions, pamphlets, or other  written materials from your doctor or pharmacy?: 1 - Never  Diabetic?no         Activities of Daily Living In your present state of health, do you have any difficulty performing the following activities: 08/12/2020  Hearing? N  Vision? N  Difficulty concentrating or making decisions? N  Walking or climbing stairs? N  Dressing or bathing? N  Doing errands, shopping? N  Preparing Food and eating ? N  Using the Toilet? N  In the past six months, have you accidently leaked urine? Y  Do you have problems with loss of bowel control? N  Managing your Medications? N  Managing your Finances? N  Housekeeping or managing  your Housekeeping? N  Some recent data might be hidden    Patient Care Team: Gayland Curry, DO as PCP - General (Geriatric Medicine) Nahser, Wonda Cheng, MD as PCP - Cardiology (Cardiology) Tat, Eustace Quail, DO as Consulting Physician (Neurology)  Indicate any recent Medical Services you may have received from other than [facility] providers in the past year (date may be approximate).     Assessment:   This is a routine wellness examination for [facility].  Hearing/Vision screen  Hearing Screening   125Hz  250Hz  500Hz  1000Hz  2000Hz  3000Hz  4000Hz  6000Hz  8000Hz   Right ear:           Left ear:           Comments: No hearing issues   Vision Screening Comments: Last eye exam 07/2020  Dietary issues and exercise activities discussed: Current Exercise Habits: The patient does not participate in regular exercise at present  Goals    . Patient Stated     Increase physical activity       Depression Screen PHQ 2/9 Scores 08/12/2020 06/20/2020 11/16/2019 11/07/2019 06/08/2019 04/17/2019 10/06/2018  PHQ - 2 Score 0 0 0 0 0 0 0    Fall Risk Fall Risk  08/12/2020 06/20/2020 02/19/2020 01/22/2020 11/16/2019  Falls in the past year? 1 1 0 0 0  Number falls in past yr: 0 0 0 0 0  Injury with Fall? 0 0 0 0 0  Risk for fall due to : - - - - -    FALL RISK PREVENTION PERTAINING TO THE  HOME:  Any stairs in or around the home? No  If so, are there any without handrails? No  Home free of loose throw rugs in walkways, pet beds, electrical cords, etc? Yes  Adequate lighting in your home to reduce risk of falls? Yes   ASSISTIVE DEVICES UTILIZED TO PREVENT FALLS:  Life alert? No  Use of a cane, walker or w/c? Yes  Grab bars in the bathroom? Yes  Shower chair or bench in shower? Yes  Elevated toilet seat or a handicapped toilet? Yes   TIMED UP AND GO:  Was the test performed? No .   Cognitive Function: MMSE - Mini Mental State Exam 04/04/2018  Orientation to time 5  Orientation to Place 5  Registration 3  Attention/ Calculation 5  Recall 2  Language- name 2 objects 2  Language- repeat 1  Language- follow 3 step command 3  Language- read & follow direction 1  Write a sentence 1  Copy design 1  Total score 29     6CIT Screen 08/12/2020  What Year? 0 points  What month? 0 points  What time? 0 points  Count back from 20 0 points  Months in reverse 0 points  Repeat phrase 0 points  Total Score 0    Immunizations Immunization History  Administered Date(s) Administered  . DTaP 06/12/2011  . Fluad Quad(high Dose 65+) 01/23/2019, 02/19/2020  . Influenza Whole 03/01/2013  . Influenza, High Dose Seasonal PF 03/03/2017, 02/17/2018  . Influenza,inj,Quad PF,6+ Mos 02/21/2014, 02/20/2015  . Influenza-Unspecified 01/31/2016  . PFIZER(Purple Top)SARS-COV-2 Vaccination 06/11/2019, 07/02/2019, 04/12/2020  . Pneumococcal Conjugate-13 10/11/2013  . Pneumococcal Polysaccharide-23 03/01/2005, 04/01/2017  . Tdap 06/12/2011  . Zoster 10/15/2010    TDAP status: Up to date  Flu Vaccine status: Up to date  Pneumococcal vaccine status: Up to date  Covid-19 vaccine status: Completed vaccines  Qualifies for Shingles Vaccine? Yes   Zostavax completed Yes   Shingrix Completed?:  No.    Education has been provided regarding the importance of this vaccine. Patient has  been advised to call insurance company to determine out of pocket expense if they have not yet received this vaccine. Advised may also receive vaccine at local pharmacy or Health Dept. Verbalized acceptance and understanding.  Screening Tests Health Maintenance  Topic Date Due  . Hepatitis C Screening  Never done  . COVID-19 Vaccine (4 - Booster for Pfizer series) 10/10/2020  . TETANUS/TDAP  06/11/2021  . INFLUENZA VACCINE  Completed  . PNA vac Low Risk Adult  Completed  . HPV VACCINES  Aged Out    Health Maintenance  Health Maintenance Due  Topic Date Due  . Hepatitis C Screening  Never done    Colorectal cancer screening: No longer required.   Lung Cancer Screening: (Low Dose CT Chest recommended if Age 48-80 years, 30 pack-year currently smoking OR have quit w/in 15years.) does not qualify.    Additional Screening:  Hepatitis C Screening: does qualify; Complete with next blood work.   Vision Screening: Recommended annual ophthalmology exams for early detection of glaucoma and other disorders of the eye. Is the patient up to date with their annual eye exam?  Yes  Who is the provider or what is the name of the office in which the patient attends annual eye exams? [facility] If pt is not established with a provider, would they like to be referred to a provider to establish care? No .   Dental Screening: Recommended annual dental exams for proper oral hygiene  Community Resource Referral / Chronic Care Management: CRR required this visit?  No   CCM required this visit?  No      Plan:     I have personally reviewed and noted the following in the patient's chart:   . Medical and social history . Use of alcohol, tobacco or illicit drugs  . Current medications and supplements . Functional ability and status . Nutritional status . Physical activity . Advanced directives . List of other physicians . Hospitalizations, surgeries, and ER visits in previous 12  months . Vitals . Screenings to include cognitive, depression, and falls . Referrals and appointments  In addition, I have reviewed and discussed with patient certain preventive protocols, quality metrics, and best practice recommendations. A written personalized care plan for preventive services as well as general preventive health recommendations were provided to patient.     [name], NP   08/12/2020   Virtual Visit via Telephone Note  I connected with@ on 08/12/20 at  3:45 PM EDT by telephone and verified that I am speaking with the correct person using two identifiers.  Location: Patient: home Provider: [facility]   I discussed the limitations, risks, security and privacy concerns of performing an evaluation and management service by telephone and the availability of in person appointments. I also discussed with the patient that there may be a patient responsible charge related to this service. The patient expressed understanding and agreed to proceed.   I discussed the assessment and treatment plan with the patient. The patient was provided an opportunity to ask questions and all were answered. The patient agreed with the plan and demonstrated an understanding of the instructions.   The patient was advised to call back or seek an in-person evaluation if the symptoms worsen or if the condition fails to improve as anticipated.  I provided 18 minutes of non-face-to-face time during this encounter.  Carlos American. Dewaine Oats, AGNP [facility]  printed and mailed

## 2020-08-22 ENCOUNTER — Other Ambulatory Visit: Payer: Self-pay | Admitting: Internal Medicine

## 2020-08-22 DIAGNOSIS — K219 Gastro-esophageal reflux disease without esophagitis: Secondary | ICD-10-CM

## 2020-08-26 ENCOUNTER — Other Ambulatory Visit: Payer: Self-pay

## 2020-08-26 ENCOUNTER — Ambulatory Visit: Payer: Medicare Other | Admitting: Cardiovascular Disease

## 2020-08-26 ENCOUNTER — Encounter: Payer: Self-pay | Admitting: Cardiovascular Disease

## 2020-08-26 VITALS — BP 114/80 | HR 67 | Ht 67.0 in | Wt 177.4 lb

## 2020-08-26 DIAGNOSIS — I10 Essential (primary) hypertension: Secondary | ICD-10-CM | POA: Diagnosis not present

## 2020-08-26 NOTE — Patient Instructions (Signed)
Medication Instructions:  Your physician recommends that you continue on your current medications as directed. Please refer to the Current Medication list given to you today.  *If you need a refill on your cardiac medications before your next appointment, please call your pharmacy*   Lab Work: none If you have labs (blood work) drawn today and your tests are completely normal, you will receive your results only by: Marland Kitchen MyChart Message (if you have MyChart) OR . A paper copy in the mail If you have any lab test that is abnormal or we need to change your treatment, we will call you to review the results.   Testing/Procedures: none   Follow-Up: At [facility], you and your health needs are our priority.  As part of our continuing mission to provide you with exceptional heart care, we have created designated Provider Care Teams.  These Care Teams include your primary Cardiologist (physician) and Advanced Practice Providers (APPs -  Physician Assistants and Nurse Practitioners) who all work together to provide you with the care you need, when you need it.   Your next appointment:    as needed  The format for your next appointment:   In Person  Provider:   Mertie Moores, MD

## 2020-08-26 NOTE — Progress Notes (Signed)
Cardiology Office Note:    Date:  08/26/2020   ID:  Justin [name], DOB 08-23-1940, MRN [id number]  PCP:  Gayland Curry, DO  [place] Cardiologist:  [facility] Electrophysiologist:  None   Referring MD: Gayland Curry, DO   Chief Complaint  Patient presents with  . Hypertension    Oct. 28, 2021   Justin [name] is a 80 y.o. male with a hx of chest pain and shortness of breath Hx of infrarenal aortic dissection ,   Has been followed by Dr. Scot Dock  We were asked to see him by Dr. Mariea Clonts for further evaluation of his shortness of breath .   Seen with cousin, Fritz Pickerel,  I saw him in  2012 for some chest pain - turned out to be GERD .  Has had some DOE . When he gets up in the morning .  Gets out of breath getting his clothes on.  This dyspnea has been gradually getting worse    Has cerebellar ataxia.   Speech is impaired.  Has balance issues.  Has to hold onto something. Uses a walker around  the house.  Unable to exercise, Rides a stationary bike  30 minutes at a time, several times a week.  BP is a little elevated. Eats salt regularly,  East sausage biscuit every am  Salts all his food her breathing better  August 26, 2020 Justin [name] is seen today for follow up of his HTN, leg edema , DOE,  Seen with caregiver, Kenney Houseman   Was wheeled in via wheelchair today. Has cerebellar ataxia  Has cut out his salt intake  Breathing is better  Echocardiogram from November, 2021 shows normal left ventricular systolic function.  He has grade 1 diastolic dysfunction.  He has trivial mitral regurgitation and trivial aortic insufficiency.   Past Medical History:  Diagnosis Date  . Abnormality of gait September 07 2007  . Allergic rhinitis, cause unspecified 2007  . Arthritis   . Ataxia   . Cervicalgia February 26, 2006  . Chest pain   . Cramp of limb 10/21/11  . Degeneration of intervertebral disc, site unspecified 1998  . Degeneration of thoracic or thoracolumbar  intervertebral disc 03/06/12  . Diaphragmatic hernia without mention of obstruction or gangrene 1982   Hiatal hernia  . Diplopia 2006  . Disturbance of skin sensation 2003  . Diverticulosis of colon (without mention of hemorrhage) 1997  . Dizziness and giddiness 2001  . Dysphagia, unspecified(787.20) 1999 and  . Esophagitis, unspecified 1997  . GERD (gastroesophageal reflux disease) 2003  . Hypertension 2003  . Impotence of organic origin 1999  . Lack of coordination September 07, 2007  . Lumbago 2003  . Migraine with aura, without mention of intractable migraine without mention of status migrainosus 1997  . Myalgia and myositis, unspecified 1999  . Other and unspecified hyperlipidemia 2003  . Other disorders of vitreous 2003  . Other malaise and fatigue 2003  . Other seborrheic keratosis 2013  . Other seborrheic keratosis 04/13/09  . Pain in joint, site unspecified 04/13/12  . Personal history of fall 04/15/11  . Restless legs syndrome (RLS) 1997  . 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 Unspecified hearing loss 2003  . Unspecified tinnitus 04/15/11    Past Surgical History:  Procedure Laterality Date  . APPENDECTOMY  Age 34  . boil removed     Dr. Donne Hazel  . C3-4 anterior cervical surgery  1999   Dr. Hal Neer  .  CARDIAC CATHETERIZATION  2001   Dr. Glade Lloyd  . COLONOSCOPY  07/20/07   Dr. Sharlett Iles  . EYE SURGERY Bilateral 2015   cataracts  . LAMINOTOMY / EXCISION DISK POSTERIOR CERVICAL SPINE     Spinal Disk (?4-5)    Current Medications: Current Meds  Medication Sig  . acetaminophen (TYLENOL 8 HOUR ARTHRITIS PAIN) 650 MG CR tablet Take 1 tablet (650 mg total) by mouth 2 (two) times daily.  Marland Kitchen amLODipine (NORVASC) 5 MG tablet TAKE 1 TABLET BY MOUTH EVERY DAY  . esomeprazole (NEXIUM) 20 MG capsule Take 1 capsule (20 mg total) by mouth 2 (two) times daily before a meal.  . hydrochlorothiazide (HYDRODIURIL) 25 MG tablet Take 1 tablet (25 mg total) by mouth  daily.  Marland Kitchen HYDROcodone-acetaminophen (NORCO/VICODIN) 5-325 MG tablet Take 1/2 tablet by mouth once daily as needed for pain  . losartan (COZAAR) 100 MG tablet TAKE 1 TABLET (100 MG TOTAL) BY MOUTH DAILY.  Marland Kitchen oxybutynin (DITROPAN) 5 MG tablet Take 5 mg by mouth. 1 tablet daily  . potassium chloride (KLOR-CON) 10 MEQ tablet Take 1 tablet (10 mEq total) by mouth daily.  . pregabalin (LYRICA) 25 MG capsule Take 1 capsule (25 mg total) by mouth 3 (three) times daily.  . propranolol (INDERAL) 40 MG tablet TAKE 1 TABLET BY MOUTH TWICE A DAY     Allergies:   Aciphex [rabeprazole sodium], Effexor [venlafaxine hcl], Serzone [nefazodone], and Vivactil [protriptyline hcl]   Social History   Socioeconomic History  . Marital status: Married    Spouse name: Not on file  . Number of children: 2  . Years of education: Not on file  . Highest education level: Not on file  Occupational History  . Occupation: Real Estate  Tobacco Use  . Smoking status: Never Smoker  . Smokeless tobacco: Former Systems developer    Types: Secondary school teacher  . Vaping Use: Never used  Substance and Sexual Activity  . Alcohol use: Yes    Alcohol/week: 0.0 standard drinks    Comment: rare wine  . Drug use: No  . Sexual activity: Yes  Other Topics Concern  . Not on file  Social History Narrative   Right handed   Drinks caffeine   One story home   Social Determinants of Health   Financial Resource Strain: Not on file  Food Insecurity: Not on file  Transportation Needs: Not on file  Physical Activity: Not on file  Stress: Not on file  Social Connections: Not on file     Family History: The patient's family history includes Heart disease in his father. There is no history of Diabetes, Colon cancer, Colon polyps, Esophageal cancer, Kidney disease, or Gallbladder disease.  ROS:   Please see the history of present illness.     All other systems reviewed and are negative.  EKGs/Labs/Other Studies Reviewed:    The following  studies were reviewed today:   EKG:      Recent Labs: 10/04/2019: ALT 17; Hemoglobin 15.1; Platelets 208 04/19/2020: BUN 19; Creatinine, Ser 1.04; Potassium 4.1; Sodium 139  Recent Lipid Panel    Component Value Date/Time   CHOL 191 10/04/2019 0949   CHOL 196 08/19/2015 0826   TRIG 281 (H) 10/04/2019 0949   HDL 41 10/04/2019 0949   HDL 54 08/19/2015 0826   CHOLHDL 4.7 10/04/2019 0949   VLDL 30 11/26/2016 0531   LDLCALC 110 (H) 10/04/2019 0949     Risk Assessment/Calculations:       Physical Exam:  Physical Exam: Blood pressure 114/80, pulse 67, height 5\' 7"  (1.702 m), weight 177 lb 6.4 oz (80.5 kg), SpO2 98 %.  GEN:  Well nourished, well developed in no acute distress HEENT: Normal NECK: No JVD; No carotid bruits LYMPHATICS: No lymphadenopathy CARDIAC: RRR , no murmurs, rubs, gallops RESPIRATORY:  Clear to auscultation without rales, wheezing or rhonchi  ABDOMEN: Soft, non-tender, non-distended MUSCULOSKELETAL:  No edema; No deformity  SKIN: Warm and dry NEUROLOGIC:  Ataxia,      ASSESSMENT:    No diagnosis found. PLAN:     Shortness of breath:   Seems to be better now that he has cut out his sodium  Echocardiogram shows normal left ventricular systolic function.  He does have grade 1 diastolic dysfunction as well as trace mitral regurgitation and aortic insufficiency.  This seems to be stable.  2.  Hypertension: We started HCTZ and potassium at his last visit.  His blood pressure is now normal.  I will have him follow-up with Dr. Mariea Clonts for further management of his blood pressure and I will ask Dr. Mariea Clonts to assume responsibility for refilling his HCTZ and potassium going forward.  .   Medication Adjustments/Labs and Tests Ordered: Current medicines are reviewed at length with the patient today.  Concerns regarding medicines are outlined above.  No orders of the defined types were placed in this encounter.  No orders of the defined types were placed in this  encounter.   Patient Instructions  Medication Instructions:  Your physician recommends that you continue on your current medications as directed. Please refer to the Current Medication list given to you today.  *If you need a refill on your cardiac medications before your next appointment, please call your pharmacy*   Lab Work: none If you have labs (blood work) drawn today and your tests are completely normal, you will receive your results only by: Marland Kitchen MyChart Message (if you have MyChart) OR . A paper copy in the mail If you have any lab test that is abnormal or we need to change your treatment, we will call you to review the results.   Testing/Procedures: none   Follow-Up: At [facility], you and your health needs are our priority.  As part of our continuing mission to provide you with exceptional heart care, we have created designated Provider Care Teams.  These Care Teams include your primary Cardiologist (physician) and Advanced Practice Providers (APPs -  Physician Assistants and Nurse Practitioners) who all work together to provide you with the care you need, when you need it.   Your next appointment:    as needed  The format for your next appointment:   In Person  Provider:   Mertie Moores, MD      Signed, Mertie Moores, MD  08/26/2020 10:05 AM    [place]

## 2020-09-18 ENCOUNTER — Other Ambulatory Visit: Payer: Self-pay | Admitting: Internal Medicine

## 2020-09-18 DIAGNOSIS — I10 Essential (primary) hypertension: Secondary | ICD-10-CM

## 2020-09-23 ENCOUNTER — Other Ambulatory Visit: Payer: Self-pay | Admitting: Internal Medicine

## 2020-09-23 NOTE — Telephone Encounter (Signed)
Patient has request refill on medication "Losartan". Insurance states that this medication "Losartan" isn't preferred. Medication pend and sent to PCP Dewaine Oats Carlos American, NP for alternative. Please Advise.

## 2020-11-07 ENCOUNTER — Other Ambulatory Visit: Payer: Self-pay | Admitting: *Deleted

## 2020-11-07 DIAGNOSIS — M545 Low back pain, unspecified: Secondary | ICD-10-CM

## 2020-11-07 DIAGNOSIS — G8929 Other chronic pain: Secondary | ICD-10-CM

## 2020-11-07 MED ORDER — HYDROCODONE-ACETAMINOPHEN 5-325 MG PO TABS: ORAL_TABLET | ORAL | 0 refills | Status: DC

## 2020-11-07 NOTE — Telephone Encounter (Signed)
Patient requested refill.  Epic LR: 09/20/2020 No Contract on File, added note to obtain at upcoming appointment Pended Rx and sent to [facility] for approval.

## 2020-12-10 ENCOUNTER — Other Ambulatory Visit: Payer: Medicare Other

## 2020-12-13 ENCOUNTER — Ambulatory Visit: Payer: Medicare Other | Admitting: Nurse Practitioner

## 2020-12-13 ENCOUNTER — Other Ambulatory Visit: Payer: Medicare Other

## 2020-12-16 ENCOUNTER — Ambulatory Visit: Payer: Medicare Other | Admitting: Internal Medicine

## 2020-12-20 ENCOUNTER — Other Ambulatory Visit: Payer: Self-pay | Admitting: *Deleted

## 2020-12-20 DIAGNOSIS — K2100 Gastro-esophageal reflux disease with esophagitis, without bleeding: Secondary | ICD-10-CM

## 2020-12-20 MED ORDER — ESOMEPRAZOLE MAGNESIUM 20 MG PO CPDR: 20.0000 mg | DELAYED_RELEASE_CAPSULE | Freq: Two times a day (BID) | ORAL | 1 refills | Status: DC

## 2020-12-20 NOTE — Telephone Encounter (Signed)
Patient requested refill. Pantoprazole was discontinued in January.  Pended Rx and sent to [facility] for approval due to [place]. Janett Billow out of office)

## 2020-12-30 ENCOUNTER — Other Ambulatory Visit: Payer: Self-pay | Admitting: *Deleted

## 2020-12-30 DIAGNOSIS — R270 Ataxia, unspecified: Secondary | ICD-10-CM

## 2020-12-30 DIAGNOSIS — G119 Hereditary ataxia, unspecified: Secondary | ICD-10-CM

## 2020-12-30 MED ORDER — PROPRANOLOL HCL 40 MG PO TABS: 40.0000 mg | ORAL_TABLET | Freq: Two times a day (BID) | ORAL | 1 refills | Status: DC

## 2020-12-30 NOTE — Telephone Encounter (Signed)
Pharmacy requested refill

## 2020-12-31 ENCOUNTER — Other Ambulatory Visit: Payer: Self-pay

## 2020-12-31 ENCOUNTER — Other Ambulatory Visit: Payer: Medicare Other

## 2020-12-31 DIAGNOSIS — E7800 Pure hypercholesterolemia, unspecified: Secondary | ICD-10-CM

## 2020-12-31 DIAGNOSIS — K2100 Gastro-esophageal reflux disease with esophagitis, without bleeding: Secondary | ICD-10-CM

## 2020-12-31 DIAGNOSIS — G118 Other hereditary ataxias: Secondary | ICD-10-CM

## 2020-12-31 DIAGNOSIS — G119 Hereditary ataxia, unspecified: Secondary | ICD-10-CM

## 2020-12-31 DIAGNOSIS — R609 Edema, unspecified: Secondary | ICD-10-CM | POA: Diagnosis not present

## 2020-12-31 DIAGNOSIS — I10 Essential (primary) hypertension: Secondary | ICD-10-CM

## 2020-12-31 DIAGNOSIS — E785 Hyperlipidemia, unspecified: Secondary | ICD-10-CM

## 2020-12-31 DIAGNOSIS — I7102 Dissection of abdominal aorta: Secondary | ICD-10-CM | POA: Diagnosis not present

## 2021-01-01 LAB — LIPID PANEL
Cholesterol: 203 mg/dL — ABNORMAL HIGH (ref <200)
HDL: 42 mg/dL (ref >=40)
LDL Cholesterol (Calc): 118 mg/dL (calc) — ABNORMAL HIGH
Non-HDL Cholesterol (Calc): 161 mg/dL (calc) — ABNORMAL HIGH (ref <130)
Total CHOL/HDL Ratio: 4.8 (calc) (ref <5.0)
Triglycerides: 301 mg/dL — ABNORMAL HIGH (ref <150)

## 2021-01-01 LAB — CBC WITH DIFFERENTIAL/PLATELET
Absolute Monocytes: 705 cells/uL (ref 200–950)
Basophils Absolute: 73 cells/uL (ref 0–200)
Basophils Relative: 0.9 %
Eosinophils Absolute: 162 cells/uL (ref 15–500)
Eosinophils Relative: 2 %
HCT: 46 % (ref 38.5–50.0)
Hemoglobin: 15.5 g/dL (ref 13.2–17.1)
Lymphs Abs: 2406 cells/uL (ref 850–3900)
MCH: 30.4 pg (ref 27.0–33.0)
MCHC: 33.7 g/dL (ref 32.0–36.0)
MCV: 90.2 fL (ref 80.0–100.0)
MPV: 10.7 fL (ref 7.5–12.5)
Monocytes Relative: 8.7 %
Neutro Abs: 4755 cells/uL (ref 1500–7800)
Neutrophils Relative %: 58.7 %
Platelets: 205 10*3/uL (ref 140–400)
RBC: 5.1 10*6/uL (ref 4.20–5.80)
RDW: 12.8 % (ref 11.0–15.0)
Total Lymphocyte: 29.7 %
WBC: 8.1 10*3/uL (ref 3.8–10.8)

## 2021-01-01 LAB — COMPLETE METABOLIC PANEL WITH GFR
AG Ratio: 1.7 (calc) (ref 1.0–2.5)
ALT: 19 U/L (ref 9–46)
AST: 17 U/L (ref 10–35)
Albumin: 4.1 g/dL (ref 3.6–5.1)
Alkaline phosphatase (APISO): 119 U/L (ref 35–144)
BUN/Creatinine Ratio: 13 (calc) (ref 6–22)
BUN: 17 mg/dL (ref 7–25)
CO2: 28 mmol/L (ref 20–32)
Calcium: 9.6 mg/dL (ref 8.6–10.3)
Chloride: 102 mmol/L (ref 98–110)
Creat: 1.32 mg/dL — ABNORMAL HIGH (ref 0.70–1.28)
Globulin: 2.4 g/dL (calc) (ref 1.9–3.7)
Glucose, Bld: 107 mg/dL — ABNORMAL HIGH (ref 65–99)
Potassium: 4.6 mmol/L (ref 3.5–5.3)
Sodium: 139 mmol/L (ref 135–146)
Total Bilirubin: 0.6 mg/dL (ref 0.2–1.2)
Total Protein: 6.5 g/dL (ref 6.1–8.1)
eGFR: 55 mL/min/{1.73_m2} — ABNORMAL LOW (ref >=60)

## 2021-01-03 ENCOUNTER — Encounter: Payer: Self-pay | Admitting: Nurse Practitioner

## 2021-01-03 ENCOUNTER — Other Ambulatory Visit: Payer: Self-pay

## 2021-01-03 ENCOUNTER — Ambulatory Visit (INDEPENDENT_AMBULATORY_CARE_PROVIDER_SITE_OTHER): Payer: Medicare Other | Admitting: Nurse Practitioner

## 2021-01-03 VITALS — BP 126/78 | HR 57 | Temp 97.3°F | Ht 67.0 in | Wt 178.0 lb

## 2021-01-03 DIAGNOSIS — R609 Edema, unspecified: Secondary | ICD-10-CM | POA: Diagnosis not present

## 2021-01-03 DIAGNOSIS — K2100 Gastro-esophageal reflux disease with esophagitis, without bleeding: Secondary | ICD-10-CM

## 2021-01-03 DIAGNOSIS — I10 Essential (primary) hypertension: Secondary | ICD-10-CM

## 2021-01-03 DIAGNOSIS — G118 Other hereditary ataxias: Secondary | ICD-10-CM

## 2021-01-03 DIAGNOSIS — E7800 Pure hypercholesterolemia, unspecified: Secondary | ICD-10-CM

## 2021-01-03 DIAGNOSIS — R7989 Other specified abnormal findings of blood chemistry: Secondary | ICD-10-CM

## 2021-01-03 MED ORDER — POTASSIUM CHLORIDE ER 10 MEQ PO TBCR: 5.0000 meq | EXTENDED_RELEASE_TABLET | Freq: Every day | ORAL | 11 refills | Status: DC

## 2021-01-03 MED ORDER — HYDROCHLOROTHIAZIDE 12.5 MG PO TABS: 12.5000 mg | ORAL_TABLET | Freq: Every day | ORAL | 3 refills | Status: DC

## 2021-01-03 MED ORDER — PANTOPRAZOLE SODIUM 40 MG PO TBEC: 40.0000 mg | DELAYED_RELEASE_TABLET | Freq: Every day | ORAL | 3 refills | Status: DC

## 2021-01-03 NOTE — Patient Instructions (Addendum)
Decrease hydrochlorothiazide to 12.5 mg by mouth daily (can half 25 mg tablet) Decrease potassium to half tablet daily   Decrease sugars/sweets/cheese/ice cream in diet.  Triglycerides are too high LOW triglyceride diet recommended.   Stop nexium Restart protonix (new Rx sent to pharmacy

## 2021-01-03 NOTE — Progress Notes (Signed)
Careteam: Patient Care Team: [name], NP as PCP - General (Geriatric Medicine) Nahser, Wonda Cheng, MD as PCP - Cardiology (Cardiology) Tat, Eustace Quail, DO as Consulting Physician (Neurology)  PLACE OF SERVICE:  [place] Directive information Does Patient Have a Medical Advance Directive?: Yes, Type of Advance Directive: Out of facility DNR (pink MOST or yellow form), Does patient want to make changes to medical advance directive?: No - Patient declined  Allergies  Allergen Reactions   Aciphex [Rabeprazole Sodium]    Effexor [Venlafaxine Hcl]    Serzone [Nefazodone]    Vivactil [Protriptyline Hcl]     Chief Complaint  Patient presents with   Medical Management of Chronic Issues    6 month follow-up and sign treatment agreement for hydrocodone. Discuss need for Hep C, covid #4 and flu vaccine or exclude. Patient would like an explanation as to why he is taking so many blood pressure medications.      HPI: Patient is a 80 y.o. male for routine follow up.   Htn- controlled on losartan 100 mg by mouth daily, amlodipine 5 mg daily, propranolol 40 mg by twice daily, Hctz was added in October by cardiology.  No shortness of breath or leg swelling.  He has cut back on eating sausage and it has helped cut back on his LE edema and blood pressure.   GERD- terrible on nexium. Caregiver feels like protonix was more beneficial.   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 Using sparingly but only thing to help when headaches get bad.  Review of Systems:  Review of Systems  Constitutional:  Negative for chills, fever and weight loss.  HENT:  Negative for tinnitus.   Respiratory:  Negative for cough, sputum production and shortness of breath.   Cardiovascular:  Negative for chest pain, palpitations and leg swelling.  Gastrointestinal:  Negative for abdominal  pain, constipation, diarrhea and heartburn.  Genitourinary:  Negative for dysuria, frequency and urgency.  Musculoskeletal:  Negative for back pain, falls, joint pain and myalgias.  Skin: Negative.   Neurological:  Negative for dizziness and headaches.  Psychiatric/Behavioral:  Negative for depression and memory loss. The patient does not have insomnia.    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   Diaphragmatic hernia without mention of obstruction or gangrene 1982   Hiatal hernia   Diplopia 2006   Disturbance of skin sensation 2003   Diverticulosis of colon (without mention of hemorrhage) 1997   Dizziness and giddiness 2001   Dysphagia, unspecified(787.20) 1999 and   Esophagitis, unspecified 1997   GERD (gastroesophageal reflux disease) 2003   Hypertension 2003   Impotence of organic origin 1999   Lack of coordination September 07, 2007   Lumbago 2003   Migraine with aura, without mention of intractable migraine without mention of status migrainosus 1997   Myalgia and myositis, unspecified 1999   Other and unspecified hyperlipidemia 2003   Other disorders of vitreous 2003   Other malaise and fatigue 2003   Other seborrheic keratosis 2013   Other seborrheic keratosis 04/13/09  Pain in joint, site unspecified 04/13/12   Personal history of fall 04/15/11   Restless legs syndrome (RLS) 1997   Spinocerebellar disease, 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, 2008   Unspecified hearing loss 2003   Unspecified tinnitus 04/15/11   Past Surgical History:  Procedure Laterality Date   APPENDECTOMY  Age 70   boil removed     Dr. Donne Hazel   C3-4 anterior cervical surgery  1999   Dr. Hal Neer    CARDIAC CATHETERIZATION  2001   Dr. Glade Lloyd   COLONOSCOPY  07/20/07   Dr. Sharlett Iles   EYE SURGERY Bilateral 2015   cataracts   LAMINOTOMY / EXCISION DISK POSTERIOR CERVICAL SPINE     Spinal Disk (?4-5)   Social History:   reports that he has never smoked. He has quit using smokeless tobacco.  His smokeless tobacco use included chew. He reports current alcohol use. He reports that he does not use drugs.  Family History  Problem Relation Age of Onset   Heart disease Father        SCA with multiple family members with SCA   Diabetes Neg Hx    Colon cancer Neg Hx    Colon polyps Neg Hx    Esophageal cancer Neg Hx    Kidney disease Neg Hx    Gallbladder disease Neg Hx     Medications: Patient's Medications  New Prescriptions   No medications on file  Previous Medications   ACETAMINOPHEN (TYLENOL 8 HOUR ARTHRITIS PAIN) 650 MG CR TABLET    Take 1 tablet (650 mg total) by mouth 2 (two) times daily.   AMLODIPINE (NORVASC) 5 MG TABLET    TAKE 1 TABLET BY MOUTH EVERY DAY   ESOMEPRAZOLE (NEXIUM) 20 MG CAPSULE    Take 1 capsule (20 mg total) by mouth 2 (two) times daily before a meal.   HYDROCHLOROTHIAZIDE (HYDRODIURIL) 25 MG TABLET    Take 1 tablet (25 mg total) by mouth daily.   HYDROCODONE-ACETAMINOPHEN (NORCO/VICODIN) 5-325 MG TABLET    Take 1/2 tablet by mouth once daily as needed for pain   LOSARTAN (COZAAR) 100 MG TABLET    TAKE 1 TABLET (100 MG TOTAL) BY MOUTH DAILY   OXYBUTYNIN (DITROPAN) 5 MG TABLET    Take 5 mg by mouth. 1 tablet daily   POTASSIUM CHLORIDE (KLOR-CON) 10 MEQ TABLET    Take 1 tablet (10 mEq total) by mouth daily.   PREGABALIN (LYRICA) 25 MG CAPSULE    Take 1 capsule (25 mg total) by mouth 3 (three) times daily.   PROPRANOLOL (INDERAL) 40 MG TABLET    Take 1 tablet (40 mg total) by mouth 2 (two) times daily.  Modified Medications   No medications on file  Discontinued Medications   No medications on file    Physical Exam:  Vitals:   01/03/21 1332  BP:  126/78  Pulse: (!) 57  Temp: (!) 97.3 F (36.3 C)  TempSrc: Temporal  SpO2: 98%  Weight: 178 lb (80.7 kg)  Height: '5\' 7"'$  (1.702 m)   Body mass index is 27.88 kg/m. Wt Readings from Last 3 Encounters:  01/03/21 178 lb (80.7 kg)  08/26/20 177 lb 6.4 oz (80.5 kg)  06/20/20 173 lb 12.8 oz (78.8 kg)    Physical Exam Constitutional:      General: He is not in acute distress.    Appearance: He is well-developed. He is not diaphoretic.  HENT:     Head: Normocephalic and atraumatic.     Right Ear: External  ear normal.     Left Ear: External ear normal.     Mouth/Throat:     Pharynx: No oropharyngeal exudate.  Eyes:     Conjunctiva/sclera: Conjunctivae normal.     Pupils: Pupils are equal, round, and reactive to light.  Cardiovascular:     Rate and Rhythm: Normal rate and regular rhythm.     Heart sounds: Normal heart sounds.  Pulmonary:     Effort: Pulmonary effort is normal.     Breath sounds: Normal breath sounds.  Abdominal:     General: Bowel sounds are normal.     Palpations: Abdomen is soft.  Musculoskeletal:        General: No tenderness.     Cervical back: Normal range of motion and neck supple.     Right lower leg: No edema.     Left lower leg: No edema.  Skin:    General: Skin is warm and dry.  Neurological:     Mental Status: He is alert and oriented to person, place, and time.     Motor: Weakness present.     Coordination: Coordination abnormal.     Gait: Gait abnormal.    Labs reviewed: Basic Metabolic Panel: Recent Labs    04/19/20 1037 12/31/20 0830  NA 139 139  K 4.1 4.6  CL 101 102  CO2 25 28  GLUCOSE 78 107*  BUN 19 17  CREATININE 1.04 1.32*  CALCIUM 9.4 9.6   Liver Function Tests: Recent Labs    12/31/20 0830  AST 17  ALT 19  BILITOT 0.6  PROT 6.5   No results for input(s): LIPASE, AMYLASE in the last 8760 hours. No results for input(s): AMMONIA in the last 8760 hours. CBC: Recent Labs    12/31/20 0830  WBC 8.1  NEUTROABS  4,755  HGB 15.5  HCT 46.0  MCV 90.2  PLT 205   Lipid Panel: Recent Labs    12/31/20 0830  CHOL 203*  HDL 42  LDLCALC 118*  TRIG 301*  CHOLHDL 4.8   TSH: No results for input(s): TSH in the last 8760 hours. A1C: No results found for: HGBA1C   Assessment/Plan 1. Gastroesophageal reflux disease with esophagitis without hemorrhage -nexium not working well. A lot of problems with GERD. Will restart protonix at this time.  - pantoprazole (PROTONIX) 40 MG tablet; Take 1 tablet (40 mg total) by mouth daily.  Dispense: 30 tablet; Refill: 3  2. Essential hypertension Well controlled at this time however with increase in Cr will decrease HCTZ. To monitor BP at home. -low sodium diet -goal bp <140/90 - hydrochlorothiazide (HYDRODIURIL) 12.5 MG tablet; Take 1 tablet (12.5 mg total) by mouth daily.  Dispense: 30 tablet; Refill: 3 - potassium chloride (KLOR-CON) 10 MEQ tablet; Take 0.5 tablets (5 mEq total) by mouth daily.  Dispense: 30 tablet; Refill: 11 - COMPLETE METABOLIC PANEL WITH GFR; Future  3. Edema, unspecified type Stable.  - hydrochlorothiazide (HYDRODIURIL) 12.5 MG tablet; Take 1 tablet (12.5 mg total) by mouth daily.  Dispense: 30 tablet; Refill: 3 - potassium chloride (KLOR-CON) 10 MEQ tablet; Take 0.5 tablets (5 mEq total) by mouth daily.  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 Continues to have help with walking and ADLS.   5. Pure hypercholesterolemia -dietary modifications encouraged as cholesterol has worsened. Would like to work on this at this time vs adding medication.  - Lipid Panel; Future - COMPLETE METABOLIC PANEL WITH GFR;  Future  6. Increase in creatinine Likely due to HCTZ and decrease fluid intake. Will decrease HCTZ at this time and monitor.  - Lipid Panel; Future - COMPLETE METABOLIC PANEL WITH GFR; Future   Next appt: 3 months. Labs prior  [employer]. [place], [place] [facility] [phone]

## 2021-01-13 ENCOUNTER — Other Ambulatory Visit: Payer: Self-pay | Admitting: *Deleted

## 2021-01-13 MED ORDER — OXYBUTYNIN CHLORIDE 5 MG PO TABS: ORAL_TABLET | ORAL | 1 refills | Status: DC

## 2021-01-13 MED ORDER — PREGABALIN 25 MG PO CAPS: 25.0000 mg | ORAL_CAPSULE | Freq: Three times a day (TID) | ORAL | 1 refills | Status: DC

## 2021-01-13 NOTE — Telephone Encounter (Signed)
Received refill Request from [facility].  Pended Rx and sent to [facility] for approval.

## 2021-02-04 DIAGNOSIS — H16221 Keratoconjunctivitis sicca, not specified as Sjogren's, right eye: Secondary | ICD-10-CM | POA: Diagnosis not present

## 2021-02-04 DIAGNOSIS — H47021 Hemorrhage in optic nerve sheath, right eye: Secondary | ICD-10-CM | POA: Diagnosis not present

## 2021-03-13 ENCOUNTER — Other Ambulatory Visit: Payer: Self-pay | Admitting: Nurse Practitioner

## 2021-03-13 MED ORDER — PREGABALIN 25 MG PO CAPS: 25.0000 mg | ORAL_CAPSULE | Freq: Three times a day (TID) | ORAL | 1 refills | Status: DC

## 2021-03-13 NOTE — Addendum Note (Signed)
Addended by: Heriberto Antigua E on: 03/13/2021 02:40 PM   Modules accepted: Orders

## 2021-03-16 ENCOUNTER — Other Ambulatory Visit: Payer: Self-pay | Admitting: Cardiovascular Disease

## 2021-03-16 ENCOUNTER — Other Ambulatory Visit: Payer: Self-pay | Admitting: Nurse Practitioner

## 2021-03-16 DIAGNOSIS — I10 Essential (primary) hypertension: Secondary | ICD-10-CM

## 2021-03-31 ENCOUNTER — Other Ambulatory Visit: Payer: Self-pay | Admitting: Nurse Practitioner

## 2021-03-31 ENCOUNTER — Other Ambulatory Visit: Payer: Self-pay | Admitting: *Deleted

## 2021-03-31 DIAGNOSIS — I10 Essential (primary) hypertension: Secondary | ICD-10-CM

## 2021-03-31 DIAGNOSIS — R609 Edema, unspecified: Secondary | ICD-10-CM

## 2021-03-31 MED ORDER — HYDROCHLOROTHIAZIDE 12.5 MG PO TABS: 12.5000 mg | ORAL_TABLET | Freq: Every day | ORAL | 3 refills | Status: DC

## 2021-03-31 NOTE — Telephone Encounter (Signed)
Patient called and stated that he take HCTZ 12.5mg  and needs refill.

## 2021-04-03 ENCOUNTER — Other Ambulatory Visit: Payer: Medicare Other

## 2021-04-03 ENCOUNTER — Other Ambulatory Visit: Payer: Self-pay

## 2021-04-03 DIAGNOSIS — I10 Essential (primary) hypertension: Secondary | ICD-10-CM

## 2021-04-03 DIAGNOSIS — R7989 Other specified abnormal findings of blood chemistry: Secondary | ICD-10-CM

## 2021-04-03 DIAGNOSIS — E7800 Pure hypercholesterolemia, unspecified: Secondary | ICD-10-CM

## 2021-04-04 LAB — COMPLETE METABOLIC PANEL WITH GFR
AG Ratio: 1.8 (calc) (ref 1.0–2.5)
ALT: 21 U/L (ref 9–46)
AST: 15 U/L (ref 10–35)
Albumin: 4.2 g/dL (ref 3.6–5.1)
Alkaline phosphatase (APISO): 109 U/L (ref 35–144)
BUN: 21 mg/dL (ref 7–25)
CO2: 26 mmol/L (ref 20–32)
Calcium: 9.6 mg/dL (ref 8.6–10.3)
Chloride: 104 mmol/L (ref 98–110)
Creat: 1.2 mg/dL (ref 0.70–1.28)
Globulin: 2.4 g/dL (calc) (ref 1.9–3.7)
Glucose, Bld: 104 mg/dL — ABNORMAL HIGH (ref 65–99)
Potassium: 4.6 mmol/L (ref 3.5–5.3)
Sodium: 140 mmol/L (ref 135–146)
Total Bilirubin: 0.5 mg/dL (ref 0.2–1.2)
Total Protein: 6.6 g/dL (ref 6.1–8.1)
eGFR: 62 mL/min/{1.73_m2} (ref >=60)

## 2021-04-04 LAB — LIPID PANEL
Cholesterol: 199 mg/dL (ref <200)
HDL: 40 mg/dL (ref >=40)
LDL Cholesterol (Calc): 118 mg/dL (calc) — ABNORMAL HIGH
Non-HDL Cholesterol (Calc): 159 mg/dL (calc) — ABNORMAL HIGH (ref <130)
Total CHOL/HDL Ratio: 5 (calc) — ABNORMAL HIGH (ref <5.0)
Triglycerides: 290 mg/dL — ABNORMAL HIGH (ref <150)

## 2021-04-07 ENCOUNTER — Other Ambulatory Visit: Payer: Self-pay

## 2021-04-07 ENCOUNTER — Encounter: Payer: Self-pay | Admitting: Nurse Practitioner

## 2021-04-07 ENCOUNTER — Ambulatory Visit (INDEPENDENT_AMBULATORY_CARE_PROVIDER_SITE_OTHER): Payer: Medicare Other | Admitting: Nurse Practitioner

## 2021-04-07 VITALS — BP 136/84 | HR 56 | Temp 97.9°F | Ht 67.0 in | Wt 183.0 lb

## 2021-04-07 DIAGNOSIS — L723 Sebaceous cyst: Secondary | ICD-10-CM | POA: Diagnosis not present

## 2021-04-07 DIAGNOSIS — K219 Gastro-esophageal reflux disease without esophagitis: Secondary | ICD-10-CM | POA: Diagnosis not present

## 2021-04-07 DIAGNOSIS — E7800 Pure hypercholesterolemia, unspecified: Secondary | ICD-10-CM

## 2021-04-07 DIAGNOSIS — I10 Essential (primary) hypertension: Secondary | ICD-10-CM

## 2021-04-07 DIAGNOSIS — R635 Abnormal weight gain: Secondary | ICD-10-CM

## 2021-04-07 NOTE — Patient Instructions (Addendum)
Increase physical activity  Continue with low sodium   Low triglyceride diet

## 2021-04-07 NOTE — Progress Notes (Signed)
Careteam: Patient Care Team: [name], NP as PCP - General (Geriatric Medicine) Nahser, Wonda Cheng, MD as PCP - Cardiology (Cardiology) Tat, Eustace Quail, DO as Consulting Physician (Neurology)  PLACE OF SERVICE:  [place] Directive information Does Patient Have a Medical Advance Directive?: Yes, Type of Advance Directive: Out of facility DNR (pink MOST or yellow form), Pre-existing out of facility DNR order (yellow form or pink MOST form): Yellow form placed in chart (order not valid for inpatient use), Does patient want to make changes to medical advance directive?: No - Patient declined  Allergies  Allergen Reactions   Aciphex [Rabeprazole Sodium]    Effexor [Venlafaxine Hcl]    Serzone [Nefazodone]    Vivactil [Protriptyline Hcl]     Chief Complaint  Patient presents with   Medical Management of Chronic Issues    3 month follow-up and discuss labs. Discuss need for covid #5 or postpone/exclude if patient refuses.      HPI: Patient is a 80 y.o. Justin [name] here for follow up  HTN- BP today 136/84. Controlled on losartan 100 mg daily, amlodipine 5 mg daily, propranolol 40 mg by twice daily, continues HCTZ. No longer following with cardiology   Reports increased SOB in the morning while getting dressed and has some dizziness. ECHO- last done November 2021 showed normal left ventricular systolic dysfunction with grade 1 diastolic dysfunction. Has trivial mitral regurgitation and trivial aortic insufficiency. Last EF 65-70%  Spinocerebellar disease- No longer following up with neurologists  Weight increase - Not exercising. Is avoiding adding salt to meals, eats oatmeal and fruit for breakfast and continues to monitor dietary intake.   GERD- states that his symptoms are "terrible" continues protonix. Feels symptoms worsen after  Headache- denies any headaches   Concerns about cyst under left arm. Has occurred intermittently for some time. Denies any pain.    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 Limited ROM in neck     Review of Systems:  Review of Systems  Constitutional:  Negative for chills, fever and malaise/fatigue.  HENT:  Positive for hearing loss. Negative for tinnitus.   Eyes:  Negative for pain.  Respiratory:  Negative for cough, sputum production, shortness of breath and wheezing.   Cardiovascular:  Positive for leg swelling. Negative for chest pain and palpitations.  Gastrointestinal:  Positive for heartburn. Negative for abdominal pain, blood in stool, constipation, diarrhea, nausea and vomiting.  Genitourinary:  Negative for dysuria.  Musculoskeletal:  Negative for falls and neck pain.       Unsteady gait chronic   Skin:  Negative for rash.  Neurological:  Negative for dizziness and headaches.  Psychiatric/Behavioral:  The patient does not have insomnia.    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   Diaphragmatic hernia without mention of obstruction or gangrene 1982   Hiatal hernia   Diplopia 2006   Disturbance of skin sensation 2003   Diverticulosis of colon (without mention of hemorrhage) 1997   Dizziness and giddiness 2001  Dysphagia, unspecified(787.20) 1999 and   Esophagitis, unspecified 1997   GERD (gastroesophageal reflux disease) 2003   Hypertension 2003   Impotence of organic origin 1999   Lack of coordination September 07, 2007   Lumbago 2003   Migraine with aura, without mention of intractable migraine without mention of status migrainosus 1997   Myalgia and myositis,  unspecified 1999   Other and unspecified hyperlipidemia 2003   Other disorders of vitreous 2003   Other malaise and fatigue 2003   Other seborrheic keratosis 2013   Other seborrheic keratosis 04/13/09   Pain in joint, site unspecified 04/13/12   Personal history of fall 04/15/11   Restless legs syndrome (RLS) 1997   Spinocerebellar disease, 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, 2008   Unspecified hearing loss 2003   Unspecified tinnitus 04/15/11   Past Surgical History:  Procedure Laterality Date   APPENDECTOMY  Age 67   boil removed     Dr. Donne Hazel   C3-4 anterior cervical surgery  1999   Dr. Hal Neer   CARDIAC CATHETERIZATION  2001   Dr. Glade Lloyd   COLONOSCOPY  07/20/07   Dr. Sharlett Iles   EYE SURGERY Bilateral 2015   cataracts   LAMINOTOMY / EXCISION DISK POSTERIOR CERVICAL SPINE     Spinal Disk (?4-5)   Social History:   reports that he has never smoked. He quit smokeless tobacco use about 41 years ago.  His smokeless tobacco use included chew. He reports that he does not currently use alcohol. He reports that he does not use drugs.  Family History  Problem Relation Age of Onset   Heart disease Father        SCA with multiple family members with SCA   Diabetes Neg Hx    Colon cancer Neg Hx    Colon polyps Neg Hx    Esophageal cancer Neg Hx    Kidney disease Neg Hx    Gallbladder disease Neg Hx     Medications: Patient's Medications  New Prescriptions   No medications on file  Previous Medications   ACETAMINOPHEN (TYLENOL 8 HOUR ARTHRITIS PAIN) 650 MG CR TABLET    Take 1 tablet (650 mg total) by mouth 2 (two) times daily.   AMLODIPINE (NORVASC) 5 MG TABLET    TAKE 1 TABLET BY MOUTH EVERY DAY   HYDROCHLOROTHIAZIDE (HYDRODIURIL) 12.5 MG TABLET    Take 1 tablet (12.5 mg total) by mouth daily.   HYDROCODONE-ACETAMINOPHEN (NORCO/VICODIN) 5-325 MG TABLET    Take 1/2 tablet by mouth once daily as needed for pain   LOSARTAN (COZAAR) 100 MG TABLET    TAKE 1 TABLET (100 MG TOTAL) BY  MOUTH DAILY   OXYBUTYNIN (DITROPAN) 5 MG TABLET    Take one tablet by mouth once daily.   PANTOPRAZOLE (PROTONIX) 40 MG TABLET    Take 1 tablet (40 mg total) by mouth daily.   POTASSIUM CHLORIDE (KLOR-CON) 10 MEQ TABLET    Take 0.5 tablets (5 mEq total) by mouth daily.   PREGABALIN (LYRICA) 25 MG CAPSULE    Take 1 capsule (25 mg total) by mouth 3 (three) times daily.   PROPRANOLOL (INDERAL) 40 MG TABLET    Take 1 tablet (40 mg total) by mouth 2 (two) times daily.  Modified Medications   No medications on file  Discontinued Medications   No medications on file    Physical Exam:  Vitals:   04/07/21 1338  BP: 136/84  Pulse: (!) 56  Temp: 97.9 F (36.6 C)  TempSrc: Temporal  SpO2: 98%  Weight: 183 lb (83 kg)  Height: 5\' 7"  (1.702 m)   Body mass index is 28.66 kg/m. Wt Readings from Last 3 Encounters:  04/07/21 183 lb (83 kg)  01/03/21 178 lb (80.7 kg)  08/26/20 177 lb 6.4 oz (80.5 kg)    Physical Exam Constitutional:      Appearance: Normal appearance.  HENT:     Head: Normocephalic.     Mouth/Throat:     Mouth: Mucous membranes are moist.  Eyes:     Pupils: Pupils are equal, round, and reactive to light.  Cardiovascular:     Rate and Rhythm: Normal rate.     Heart sounds: Murmur heard.  Pulmonary:     Effort: Pulmonary effort is normal.     Breath sounds: Normal breath sounds.  Abdominal:     General: Bowel sounds are normal.  Musculoskeletal:     Cervical back: No rigidity. Decreased range of motion.     Left lower leg: Edema present.     Comments: Wears compression sock   Skin:    General: Skin is warm.  Neurological:     Mental Status: He is alert and oriented to person, place, and time.     Motor: Weakness present.     Gait: Gait abnormal.  Psychiatric:        Mood and Affect: Mood normal.    Labs reviewed: Basic Metabolic Panel: Recent Labs    04/19/20 1037 12/31/20 0830 04/03/21 0824  NA 139 139 140  K 4.1 4.6 4.6  CL 101 102 104  CO2 25 28  26   GLUCOSE 78 107* 104*  BUN 19 17 21   CREATININE 1.04 1.32* 1.20  CALCIUM 9.4 9.6 9.6   Liver Function Tests: Recent Labs    12/31/20 0830 04/03/21 0824  AST 17 15  ALT 19 21  BILITOT 0.6 0.5  PROT 6.5 6.6   No results for input(s): LIPASE, AMYLASE in the last 8760 hours. No results for input(s): AMMONIA in the last 8760 hours. CBC: Recent Labs    12/31/20 0830  WBC 8.1  NEUTROABS 4,755  HGB 15.5  HCT 46.0  MCV 90.2  PLT 205   Lipid Panel: Recent Labs    12/31/20 0830 04/03/21 0824  CHOL 203* 199  HDL 42 40  LDLCALC 118* 118*  TRIG 301* 290*  CHOLHDL 4.8 5.0*   TSH: No results for input(s): TSH in the last 8760 hours. A1C: No results found for: HGBA1C   Assessment/Plan  1. Essential hypertension Stable, Continue regime of losartan 100 mg daily, amlodipine 5 mg daily, propranolol 40 mg by twice daily and HCTZ. Continue to avoid adding salt to foods.   2. Gastroesophageal reflux disease without esophagitis Ongoing, he has been off medication recently but plans to restart Protonix. Continue dietary modifications.   3. Pure hypercholesterolemia Stable. Reviewed labs. Cholesterol and Triglycerides trending down. LDL remains the same at 118. Encouraged to continue diet modifications by following a low triglyceride diet.   4. Sebaceous cyst Stable, without pain or drainage. Encouraged the use of warm compress to area.   5. Weight gain Noted today, Encouraged to exercise and continue dietary modifications.    Next appt: 3 months.  I personally was present during the history, physical exam and medical decision-making activities of this service and have verified that the service and findings are accurately documented in the student's note  Kia Stavros K. [place], [address] [facility] [phone]

## 2021-04-09 ENCOUNTER — Ambulatory Visit: Payer: Medicare Other | Admitting: Nurse Practitioner

## 2021-04-10 ENCOUNTER — Other Ambulatory Visit: Payer: Self-pay | Admitting: Nurse Practitioner

## 2021-04-10 DIAGNOSIS — K2100 Gastro-esophageal reflux disease with esophagitis, without bleeding: Secondary | ICD-10-CM

## 2021-04-10 NOTE — Telephone Encounter (Signed)
Patient has request refill on medication "Pantoprazole". Medication last refilled on 01/03/2021. Patient medication has Allergy Contraindications. Medication pend and sent to PCP Dewaine Oats Carlos American, NP for approval. Please Advise.

## 2021-04-11 ENCOUNTER — Other Ambulatory Visit: Payer: Self-pay | Admitting: Cardiovascular Disease

## 2021-04-15 ENCOUNTER — Telehealth: Payer: Self-pay | Admitting: *Deleted

## 2021-04-15 NOTE — Telephone Encounter (Signed)
Patient called back and stated that he will pick up Paperwork tomorrow.  Placed up front in drawer for pick up.

## 2021-04-15 NOTE — Telephone Encounter (Signed)
Justin [name] has completed the [facility] Paperwork that was dropped off.  Called patient to see if he wants [place] to mail it to him or he wants to pick up. LMOM for him to return call.  DMV #:[phone]  Copy made of paperwork and sent for scanning.

## 2021-06-27 ENCOUNTER — Other Ambulatory Visit: Payer: Self-pay | Admitting: Nurse Practitioner

## 2021-06-27 DIAGNOSIS — R270 Ataxia, unspecified: Secondary | ICD-10-CM

## 2021-06-27 DIAGNOSIS — G119 Hereditary ataxia, unspecified: Secondary | ICD-10-CM

## 2021-06-28 ENCOUNTER — Other Ambulatory Visit: Payer: Self-pay | Admitting: Nurse Practitioner

## 2021-06-28 DIAGNOSIS — I10 Essential (primary) hypertension: Secondary | ICD-10-CM

## 2021-06-28 DIAGNOSIS — R609 Edema, unspecified: Secondary | ICD-10-CM

## 2021-07-04 DIAGNOSIS — H26492 Other secondary cataract, left eye: Secondary | ICD-10-CM | POA: Diagnosis not present

## 2021-07-04 DIAGNOSIS — H52223 Regular astigmatism, bilateral: Secondary | ICD-10-CM | POA: Diagnosis not present

## 2021-07-04 DIAGNOSIS — H5203 Hypermetropia, bilateral: Secondary | ICD-10-CM | POA: Diagnosis not present

## 2021-07-04 DIAGNOSIS — H16221 Keratoconjunctivitis sicca, not specified as Sjogren's, right eye: Secondary | ICD-10-CM | POA: Diagnosis not present

## 2021-07-07 ENCOUNTER — Other Ambulatory Visit: Payer: Self-pay | Admitting: Nurse Practitioner

## 2021-08-12 ENCOUNTER — Other Ambulatory Visit: Payer: Medicare Other

## 2021-08-12 ENCOUNTER — Other Ambulatory Visit: Payer: Self-pay

## 2021-08-12 DIAGNOSIS — G118 Other hereditary ataxias: Secondary | ICD-10-CM | POA: Diagnosis not present

## 2021-08-12 DIAGNOSIS — I10 Essential (primary) hypertension: Secondary | ICD-10-CM | POA: Diagnosis not present

## 2021-08-12 DIAGNOSIS — E7800 Pure hypercholesterolemia, unspecified: Secondary | ICD-10-CM | POA: Diagnosis not present

## 2021-08-12 DIAGNOSIS — R7309 Other abnormal glucose: Secondary | ICD-10-CM | POA: Diagnosis not present

## 2021-08-12 DIAGNOSIS — E785 Hyperlipidemia, unspecified: Secondary | ICD-10-CM | POA: Diagnosis not present

## 2021-08-15 ENCOUNTER — Other Ambulatory Visit: Payer: Self-pay | Admitting: Nurse Practitioner

## 2021-08-15 ENCOUNTER — Encounter: Payer: Self-pay | Admitting: Nurse Practitioner

## 2021-08-15 ENCOUNTER — Ambulatory Visit (INDEPENDENT_AMBULATORY_CARE_PROVIDER_SITE_OTHER): Payer: Medicare Other | Admitting: Nurse Practitioner

## 2021-08-15 ENCOUNTER — Other Ambulatory Visit: Payer: Self-pay

## 2021-08-15 VITALS — BP 128/76 | HR 61 | Temp 97.3°F | Ht 67.0 in | Wt 186.0 lb

## 2021-08-15 DIAGNOSIS — R350 Frequency of micturition: Secondary | ICD-10-CM

## 2021-08-15 DIAGNOSIS — G119 Hereditary ataxia, unspecified: Secondary | ICD-10-CM | POA: Diagnosis not present

## 2021-08-15 DIAGNOSIS — I10 Essential (primary) hypertension: Secondary | ICD-10-CM

## 2021-08-15 DIAGNOSIS — I7102 Dissection of abdominal aorta: Secondary | ICD-10-CM

## 2021-08-15 DIAGNOSIS — E7800 Pure hypercholesterolemia, unspecified: Secondary | ICD-10-CM

## 2021-08-15 DIAGNOSIS — K219 Gastro-esophageal reflux disease without esophagitis: Secondary | ICD-10-CM

## 2021-08-15 DIAGNOSIS — R609 Edema, unspecified: Secondary | ICD-10-CM

## 2021-08-15 DIAGNOSIS — L739 Follicular disorder, unspecified: Secondary | ICD-10-CM

## 2021-08-15 MED ORDER — DEXLANSOPRAZOLE 30 MG PO CPDR: 30.0000 mg | DELAYED_RELEASE_CAPSULE | Freq: Every day | ORAL | 2 refills | Status: DC

## 2021-08-15 MED ORDER — POTASSIUM CHLORIDE ER 10 MEQ PO TBCR: 10.0000 meq | EXTENDED_RELEASE_TABLET | Freq: Every day | ORAL | 11 refills | Status: DC

## 2021-08-15 MED ORDER — PREGABALIN 25 MG PO CAPS: 25.0000 mg | ORAL_CAPSULE | Freq: Two times a day (BID) | ORAL | 1 refills | Status: DC

## 2021-08-15 NOTE — Progress Notes (Signed)
? ? ?Careteam: ?Patient Care Team: ?[name], NP as PCP - General (Geriatric Medicine) ?Nahser, Wonda Cheng, MD as PCP - Cardiology (Cardiology) ?Ludwig Clarks, DO as Consulting Physician (Neurology) ? ?PLACE OF SERVICE:  ?[facility]  ?Advanced Directive information ?Does Patient Have a Medical Advance Directive?: Yes, Type of Advance Directive: Out of facility DNR (pink MOST or yellow form), Pre-existing out of facility DNR order (yellow form or pink MOST form): Yellow form placed in chart (order not valid for inpatient use), Does patient want to make changes to medical advance directive?: No - Patient declined ? ?Allergies  ?Allergen Reactions  ? Aciphex [Rabeprazole Sodium]   ? Effexor [Venlafaxine Hcl]   ? Serzone [Nefazodone]   ? Vivactil [Protriptyline Hcl]   ? ? ?Chief Complaint  ?Patient presents with  ? Medical Management of Chronic Issues  ?  4 month follow-up and discuss recent labs. Discuss need for additional covid boosters and td/tdap. [facility] verified. Patient denies receiving any vaccines since last visit. Skin concern (bumps) on left and right side of rib area. Discuss potassium (1/2 versus a whole tablet). Patient currently taking a whole tablet. Patient also with middle finger concerns on right hand. Moderate fall risk. Leans forward when walking due to being afraid of falling backwards.  ?  ? ? ? ?HPI: Patient is a 81 y.o. male for routine follow up.  ? ?Has small bump on side that can be painful.  ? ?He has cut back his lyrica to twice daily due to possible side effects. Working well twice daily.  ? ?Has trigger finger in right hand middle finger.  ? ?He has been taking a whole potassium tablet for as long as he can remember.  ? ?Will have pain if he stands for too  long. Uses walker for support. Leaning fwd due to balance issues. Does not wish to have any further PT.  ? ?Doing well on oxybutyin.  ? ?Occasionally has a fall. Had fall last week when watching a dog. No pain with  falls ? ?Occasionally has used [facility] for headache but not needed lately ? ?GERD- still bad he will take it twice daily and Rx will run out early. He is taking protonix twice daily to get results but still will have breakthrough and using nexium which does not help.  ? ? ? ?Review of Systems:  ?Review of Systems  ?Constitutional:  Negative for chills, fever and weight loss.  ?HENT:  Negative for tinnitus.   ?Respiratory:  Negative for cough, sputum production and shortness of breath.   ?Cardiovascular:  Negative for chest pain, palpitations and leg swelling.  ?Gastrointestinal:  Negative for abdominal pain, constipation, diarrhea and heartburn.  ?Genitourinary:  Positive for frequency. Negative for dysuria and urgency.  ?Musculoskeletal:  Negative for back pain, falls, joint pain and myalgias.  ?Skin: Negative.   ?Neurological:  Positive for weakness. Negative for dizziness and headaches.  ?Psychiatric/Behavioral:  Negative for depression and memory loss. The patient does not have insomnia.   ? ?Past Medical History:  ?Diagnosis Date  ? Abnormality of gait September 07 2007  ? Allergic rhinitis, cause unspecified 2007  ? Arthritis   ? Ataxia   ? Cervicalgia February 26, 2006  ? Chest pain   ? Cramp of limb 10/21/11  ? Degeneration of intervertebral disc, site unspecified 1998  ? 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 Diaphragmatic hernia without mention of obstruction or gangrene 1982  ? Hiatal hernia  ?  Diplopia 2006  ? Disturbance of skin sensation 2003  ? Diverticulosis of colon (without mention of hemorrhage) 1997  ? Dizziness and giddiness 2001  ? Dysphagia, unspecified(787.20) 1999 and  ? Esophagitis, unspecified 1997  ? GERD (gastroesophageal reflux disease) 2003  ? Hypertension 2003  ? Impotence of organic origin 1999  ? Lack of coordination September 07, 2007  ? Lumbago 2003  ? Migraine with aura, without mention of intractable migraine without mention of status migrainosus 1997  ?  Myalgia and myositis, unspecified 1999  ? Other and unspecified hyperlipidemia 2003  ? Other disorders of vitreous 2003  ? Other malaise and fatigue 2003  ? Other seborrheic keratosis 2013  ? Other seborrheic keratosis 04/13/09  ? Pain in joint, site unspecified 04/13/12  ? Personal history of fall 04/15/11  ? Restless legs syndrome (RLS) 1997  ? 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 Unspecified hearing loss 2003  ? Unspecified tinnitus 04/15/11  ? ?Past Surgical History:  ?Procedure Laterality Date  ? APPENDECTOMY  Age 49  ? boil removed    ? Dr. Donne Hazel  ? C3-4 anterior cervical surgery  1999  ? Dr. Hal Neer  ? CARDIAC CATHETERIZATION  2001  ? Dr. Glade Lloyd  ? COLONOSCOPY  07/20/07  ? Dr. Sharlett Iles  ? EYE SURGERY Bilateral 2015  ? cataracts  ? LAMINOTOMY / EXCISION DISK POSTERIOR CERVICAL SPINE    ? Spinal Disk (?4-5)  ? ?Social History: ?  reports that he has never smoked. He quit smokeless tobacco use about 42 years ago.  His smokeless tobacco use included chew. He reports that he does not currently use alcohol. He reports that he does not use drugs. ? ?Family History  ?Problem Relation Age of Onset  ? Heart disease Father   ?     SCA with multiple family members with SCA  ? Diabetes Neg Hx   ? Colon cancer Neg Hx   ? Colon polyps Neg Hx   ? Esophageal cancer Neg Hx   ? Kidney disease Neg Hx   ? Gallbladder disease Neg Hx   ? ? ?Medications: ?Patient's Medications  ?New Prescriptions  ? No medications on file  ?Previous Medications  ? ACETAMINOPHEN (TYLENOL 8 HOUR ARTHRITIS PAIN) 650 MG CR TABLET    Take 1 tablet (650 mg total) by mouth 2 (two) times daily.  ? AMLODIPINE (NORVASC) 5 MG TABLET    TAKE 1 TABLET BY MOUTH EVERY DAY  ? HYDROCHLOROTHIAZIDE (HYDRODIURIL) 12.5 MG TABLET    TAKE 1 TABLET BY MOUTH EVERY DAY  ? HYDROCODONE-ACETAMINOPHEN (NORCO/VICODIN) 5-325 MG TABLET    Take 1/2 tablet by mouth once daily as needed for pain  ? LOSARTAN (COZAAR) 100 MG TABLET    TAKE 1 TABLET (100 MG  TOTAL) BY MOUTH DAILY  ? OXYBUTYNIN (DITROPAN) 5 MG TABLET    TAKE 1 TABLET BY MOUTH EVERY DAY  ? PANTOPRAZOLE (PROTONIX) 40 MG TABLET    TAKE 1 TABLET BY MOUTH EVERY DAY  ? POTASSIUM CHLORIDE (KLOR-CON) 10 MEQ TABLET    Take 0.5 tablets (5 mEq total) by mouth daily.  ? PREGABALIN (LYRICA) 25 MG CAPSULE    Take 1 capsule (25 mg total) by mouth 3 (three) times daily.  ? PROPRANOLOL (INDERAL) 40 MG TABLET    TAKE 1 TABLET BY MOUTH TWICE A DAY  ?Modified Medications  ? No medications on file  ?Discontinued Medications  ? No medications on file  ? ? ?Physical Exam: ? ?Vitals:  ?  08/15/21 1415  ?BP: 128/76  ?Pulse: 61  ?Temp: (!) 97.3 ?F (36.3 ?C)  ?TempSrc: Temporal  ?SpO2: 98%  ?Weight: 186 lb (84.4 kg)  ?Height: '5\' 7"'$  (1.702 m)  ? ?Body mass index is 29.13 kg/m?. ?Wt Readings from Last 3 Encounters:  ?08/15/21 186 lb (84.4 kg)  ?04/07/21 183 lb (83 kg)  ?01/03/21 178 lb (80.7 kg)  ? ? ?Physical Exam ?Constitutional:   ?   General: He is not in acute distress. ?   Appearance: He is well-developed. He is not diaphoretic.  ?HENT:  ?   Head: Normocephalic and atraumatic.  ?   Right Ear: External ear normal.  ?   Left Ear: External ear normal.  ?   Mouth/Throat:  ?   Pharynx: No oropharyngeal exudate.  ?Eyes:  ?   Conjunctiva/sclera: Conjunctivae normal.  ?   Pupils: Pupils are equal, round, and reactive to light.  ?Cardiovascular:  ?   Rate and Rhythm: Normal rate and regular rhythm.  ?   Heart sounds: Normal heart sounds.  ?Pulmonary:  ?   Effort: Pulmonary effort is normal.  ?   Breath sounds: Normal breath sounds.  ?Abdominal:  ?   General: Bowel sounds are normal.  ?   Palpations: Abdomen is soft.  ?Musculoskeletal:     ?   General: No tenderness.  ?   Cervical back: Normal range of motion and neck supple.  ?   Right lower leg: No edema.  ?   Left lower leg: No edema.  ?Skin: ?   General: Skin is warm and dry.  ?Neurological:  ?   Mental Status: He is alert and oriented to person, place, and time.  ? ? ?Labs  reviewed: ?Basic Metabolic Panel: ?Recent Labs  ?  12/31/20 ?0830 04/03/21 ?0824 08/12/21 ?0843  ?NA 139 140 140  ?K 4.6 4.6 4.5  ?CL 102 104 103  ?CO2 '28 26 24  '$ ?GLUCOSE 107* 104* 122*  ?BUN '17 21 17  '$ ?CREATININE 1.32* 1.20 1.20

## 2021-08-15 NOTE — Patient Instructions (Signed)
Continue to work on decreasing sweets ? ?

## 2021-08-18 ENCOUNTER — Telehealth: Payer: Self-pay

## 2021-08-18 ENCOUNTER — Ambulatory Visit (INDEPENDENT_AMBULATORY_CARE_PROVIDER_SITE_OTHER): Payer: Medicare Other | Admitting: Nurse Practitioner

## 2021-08-18 ENCOUNTER — Other Ambulatory Visit: Payer: Self-pay

## 2021-08-18 ENCOUNTER — Encounter: Payer: Self-pay | Admitting: Nurse Practitioner

## 2021-08-18 DIAGNOSIS — Z0000 Encounter for general adult medical examination without abnormal findings: Secondary | ICD-10-CM

## 2021-08-18 MED ORDER — MUPIROCIN CALCIUM 2 % EX CREA: 1.0000 "application " | TOPICAL_CREAM | Freq: Two times a day (BID) | CUTANEOUS | 0 refills | Status: DC

## 2021-08-18 NOTE — Telephone Encounter (Signed)
Pharmacy is requesting alternative medication for "Dexlansoprazole". Medication routed to PCP Dewaine Oats Carlos American, NP  ?

## 2021-08-18 NOTE — Progress Notes (Signed)
Subjective:   Justin [name] is a 81 y.o. male who presents for Medicare Annual/Subsequent preventive examination.  Review of Systems     Cardiac Risk Factors include: advanced age (>4men, >59 women);hypertension;dyslipidemia;sedentary lifestyle;male gender     Objective:    There were no vitals filed for this visit. There is no height or weight on file to calculate BMI.  Advanced Directives 08/18/2021 08/15/2021 04/07/2021 01/03/2021 08/12/2020 06/20/2020 02/19/2020  Does Patient Have a Medical Advance Directive? Yes Yes Yes Yes Yes Yes Yes  Type of Advance Directive Out of facility DNR (pink MOST or yellow form) Out of facility DNR (pink MOST or yellow form) Out of facility DNR (pink MOST or yellow form) Out of facility DNR (pink MOST or yellow form) Out of facility DNR (pink MOST or yellow form) - Out of facility DNR (pink MOST or yellow form)  Does patient want to make changes to medical advance directive? No - Patient declined No - Patient declined No - Patient declined No - Patient declined No - Patient declined No - Patient declined No - Patient declined  Copy of Healthcare Power of Attorney in Chart? - - - - - - -  Would patient like information on creating a medical advance directive? - - - - - - -  Pre-existing out of facility DNR order (yellow form or pink MOST form) Yellow form placed in chart (order not valid for inpatient use) Yellow form placed in chart (order not valid for inpatient use) Yellow form placed in chart (order not valid for inpatient use) - - - -    Current Medications (verified) Outpatient Encounter Medications as of 08/18/2021  Medication Sig   acetaminophen (TYLENOL 8 HOUR ARTHRITIS PAIN) 650 MG CR tablet Take 1 tablet (650 mg total) by mouth 2 (two) times daily.   amLODipine (NORVASC) 5 MG tablet TAKE 1 TABLET BY MOUTH EVERY DAY   Dexlansoprazole 30 MG capsule DR Take 1 capsule (30 mg total) by mouth daily.   hydrochlorothiazide (HYDRODIURIL) 12.5 MG tablet  TAKE 1 TABLET BY MOUTH EVERY DAY   losartan (COZAAR) 100 MG tablet TAKE 1 TABLET (100 MG TOTAL) BY MOUTH DAILY   oxybutynin (DITROPAN) 5 MG tablet TAKE 1 TABLET BY MOUTH EVERY DAY   potassium chloride (KLOR-CON) 10 MEQ tablet Take 1 tablet (10 mEq total) by mouth daily.   pregabalin (LYRICA) 25 MG capsule Take 1 capsule (25 mg total) by mouth 2 (two) times daily.   propranolol (INDERAL) 40 MG tablet TAKE 1 TABLET BY MOUTH TWICE A DAY   No facility-administered encounter medications on file as of 08/18/2021.    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   Diaphragmatic hernia without mention of obstruction or gangrene 1982   Hiatal hernia   Diplopia 2006   Disturbance of skin sensation 2003   Diverticulosis of colon (without mention of hemorrhage) 1997   Dizziness and giddiness 2001   Dysphagia, unspecified(787.20) 1999 and   Esophagitis, unspecified 1997   GERD (gastroesophageal reflux disease) 2003   Hypertension 2003   Impotence of organic  origin 1999   Lack of coordination September 07, 2007   Lumbago 2003   Migraine with aura, without mention of intractable migraine without mention of status migrainosus 1997   Myalgia and myositis, unspecified 1999   Other and unspecified hyperlipidemia 2003   Other disorders of vitreous 2003   Other malaise and fatigue 2003   Other seborrheic keratosis 2013   Other seborrheic keratosis 04/13/09   Pain in joint, site unspecified 04/13/12   Personal history of fall 04/15/11   Restless legs syndrome (RLS) 1997   Spinocerebellar  disease, 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 Dwain Sarna   C3-4 anterior cervical surgery  1999   Dr. Gerlene Fee   CARDIAC CATHETERIZATION  2001   Dr. Aleen Campi   COLONOSCOPY  07/20/07   Dr. Jarold Motto   EYE SURGERY Bilateral 2015   cataracts   LAMINOTOMY / EXCISION DISK POSTERIOR CERVICAL SPINE     Spinal Disk (?4-5)   Family History  Problem Relation Age of Onset   Heart disease Father        SCA with multiple family members with SCA   Diabetes Neg Hx    Colon cancer Neg Hx    Colon polyps Neg Hx    Esophageal cancer Neg Hx    Kidney disease Neg Hx    Gallbladder disease Neg Hx    Social History   Socioeconomic History   Marital status: Widowed    Spouse name: Not on file   Number of children: 2   Years of education: Not on file   Highest education level: Not on file  Occupational History   Occupation: Real Estate  Tobacco Use   Smoking status: Never   Smokeless tobacco: Former    Types: Chew    Quit date: 06/02/1979   Tobacco comments:    Thomas Hoff about age 47  Vaping Use   Vaping Use: Never used  Substance and Sexual Activity   Alcohol use: Not Currently   Drug use: No   Sexual activity: Yes  Other Topics Concern   Not on file  Social History Narrative   Right handed   Drinks caffeine   One story home   Social Determinants of Health   Financial Resource Strain: Not on file  Food Insecurity: Not on file  Transportation Needs: Not on file  Physical Activity: Not on file  Stress: Not on file  Social Connections: Not on file    Tobacco Counseling Counseling given: Not Answered Tobacco comments: Thomas Hoff about age 80   Clinical Intake:  Pre-visit preparation completed: Yes  Pain : No/denies pain     BMI - recorded: 29 Nutritional Status: BMI 25 -29 Overweight Nutritional Risks:  None Diabetes: No  How often do you need to have someone help you when you read instructions, pamphlets, or other written materials from your doctor or pharmacy?: 1 - Never  Diabetic?no         Activities of Daily Living In your present state of health, do you have any difficulty performing the following activities: 08/18/2021  Hearing? N  Vision? N  Difficulty concentrating or making decisions? Y  Comment occasionally  Walking or climbing stairs? Y  Comment uses walker  Dressing or bathing? N  Comment has assistance in shower  Doing errands, shopping? Jeannie Fend  Preparing Food and eating ? Y  Comment has help preparing food  Using the Toilet? N  In the past six months, have you accidently leaked urine? Y  Do you have problems with loss of bowel control? N  Managing your Medications? Y  Comment has help  Managing your Finances? N  Housekeeping or managing your Housekeeping? Y  Comment has help  Some recent data might be hidden    Patient Care Team: Sharon Seller, NP as PCP - General (Geriatric Medicine) Nahser, Deloris Ping, MD as PCP - Cardiology (Cardiology) Tat, Octaviano Batty, DO as Consulting Physician (Neurology)  Indicate any recent Medical Services you may have received from other than [facility] providers in the past year (date may be approximate).     Assessment:   This is a routine wellness examination for [facility].  Hearing/Vision screen Hearing Screening - Comments:: Ongoing hearing concerns Vision Screening - Comments:: Eye exam completed within the last 12 months   Dietary issues and exercise activities discussed: Current Exercise Habits: Home exercise routine, Type of exercise: Other - see comments, Time (Minutes): 45, Frequency (Times/Week): 7, Weekly Exercise (Minutes/Week): 315   Goals Addressed   None    Depression Screen PHQ 2/9 Scores 04/07/2021 01/03/2021 08/12/2020 06/20/2020 11/16/2019 11/07/2019 06/08/2019  PHQ - 2 Score 0 0 0 0 0 0 0    Fall Risk Fall Risk   08/15/2021 04/07/2021 01/03/2021 08/12/2020 06/20/2020  Falls in the past year? 1 0 0 1 1  Number falls in past yr: 1 0 0 0 0  Injury with Fall? 0 0 0 0 0  Risk for fall due to : History of fall(s) No Fall Risks No Fall Risks - -  Follow up Falls evaluation completed Falls evaluation completed Falls evaluation completed - -    FALL RISK PREVENTION PERTAINING TO THE HOME:  Any stairs in or around the home? Yes  If so, are there any without handrails? No  Home free of loose throw rugs in walkways, pet beds, electrical cords, etc? Yes  Adequate lighting in your home to reduce risk of falls? Yes   ASSISTIVE DEVICES UTILIZED TO PREVENT FALLS:  Life alert? No  Use of a cane, walker or w/c? Yes  Grab bars in the bathroom? Yes  Shower chair or bench in shower? No  Elevated toilet seat or a handicapped toilet? Yes   TIMED UP AND GO:  Was the test performed? No .    Gait slow and steady with assistive device  Cognitive Function: MMSE - Mini Mental State Exam 04/04/2018  Orientation to time 5  Orientation to Place 5  Registration 3  Attention/ Calculation 5  Recall 2  Language- name 2 objects 2  Language- repeat 1  Language- follow 3 step command 3  Language- read & follow direction 1  Write a sentence 1  Copy design 1  Total score 29     6CIT Screen 08/12/2020  What Year? 0 points  What month? 0 points  What time? 0 points  Count back from 20 0 points  Months in reverse 0 points  Repeat phrase 0 points  Total Score 0    Immunizations Immunization History  Administered Date(s) Administered   DTaP 06/12/2011   Fluad Quad(high Dose 65+) 01/23/2019, 02/19/2020   Influenza Whole 03/01/2013   Influenza, High Dose Seasonal PF 03/03/2017, 02/17/2018, 03/15/2021   Influenza,inj,Quad PF,6+ Mos 02/21/2014, 02/20/2015   Influenza-Unspecified 01/31/2016   PFIZER(Purple Top)SARS-COV-2 Vaccination 06/11/2019, 07/02/2019, 04/12/2020, 01/14/2021  Pneumococcal Conjugate-13 10/11/2013    Pneumococcal Polysaccharide-23 03/01/2005, 04/01/2017   Tdap 06/12/2011   Zoster Recombinat (Shingrix) 08/23/2020, 11/26/2020   Zoster, Live 10/15/2010    TDAP status: Due, Education has been provided regarding the importance of this vaccine. Advised may receive this vaccine at local pharmacy or Health Dept. Aware to provide a copy of the vaccination record if obtained from local pharmacy or Health Dept. Verbalized acceptance and understanding.  Flu Vaccine status: Up to date  Pneumococcal vaccine status: Up to date  Covid-19 vaccine status: Information provided on how to obtain vaccines.   Qualifies for Shingles Vaccine? Yes   Zostavax completed Yes   Shingrix Completed?: Yes  Screening Tests Health Maintenance  Topic Date Due   COVID-19 Vaccine (5 - Booster for Pfizer series) 03/11/2021   TETANUS/TDAP  06/11/2021   Pneumonia Vaccine 32+ Years old  Completed   INFLUENZA VACCINE  Completed   Zoster Vaccines- Shingrix  Completed   HPV VACCINES  Aged Out    Health Maintenance  Health Maintenance Due  Topic Date Due   COVID-19 Vaccine (5 - Booster for Pfizer series) 03/11/2021   TETANUS/TDAP  06/11/2021    Colorectal cancer screening: No longer required.   Lung Cancer Screening: (Low Dose CT Chest recommended if Age 36-80 years, 30 pack-year currently smoking OR have quit w/in 15years.) does not qualify.   Lung Cancer Screening Referral: na  Additional Screening:  Hepatitis C Screening: does not qualify  Vision Screening: Recommended annual ophthalmology exams for early detection of glaucoma and other disorders of the eye. Is the patient up to date with their annual eye exam?  Yes  Who is the provider or what is the name of the office in which the patient attends annual eye exams? [facility] If pt is not established with a provider, would they like to be referred to a provider to establish care? No .   Dental Screening: Recommended annual dental exams for proper  oral hygiene  Community Resource Referral / Chronic Care Management: CRR required this visit?  No   CCM required this visit?  No      Plan:     I have personally reviewed and noted the following in the patients chart:   Medical and social history Use of alcohol, tobacco or illicit drugs  Current medications and supplements including opioid prescriptions. Patient is not currently taking opioid prescriptions. Functional ability and status Nutritional status Physical activity Advanced directives List of other physicians Hospitalizations, surgeries, and ER visits in previous 12 months Vitals Screenings to include cognitive, depression, and falls Referrals and appointments  In addition, I have reviewed and discussed with patient certain preventive protocols, quality metrics, and best practice recommendations. A written personalized care plan for preventive services as well as general preventive health recommendations were provided to patient.     Sharon Seller, NP   08/18/2021    Virtual Visit via Telephone Note  I connected with patient 08/18/21 at  9:20 AM EDT by telephone and verified that I am speaking with the correct person using two identifiers.  Location: Patient: home Provider: [facility]   I discussed the limitations, risks, security and privacy concerns of performing an evaluation and management service by telephone and the availability of in person appointments. I also discussed with the patient that there may be a patient responsible charge related to this service. The patient expressed understanding and agreed to proceed.   I discussed the assessment and treatment plan with the patient.  The patient was provided an opportunity to ask questions and all were answered. The patient agreed with the plan and demonstrated an understanding of the instructions.   The patient was advised to call back or seek an in-person evaluation if the symptoms worsen or if the condition  fails to improve as anticipated.  I provided 16 minutes of non-face-to-face time during this encounter.  Janene Harvey. Biag[address] [employer] printed and mailed

## 2021-08-18 NOTE — Telephone Encounter (Signed)
Message routed back to PCP. Select alternative medication. Thank you! ?

## 2021-08-18 NOTE — Telephone Encounter (Signed)
Why?  

## 2021-08-18 NOTE — Patient Instructions (Signed)
Mr. Nohr , ?Thank you for taking time to come for your Medicare Wellness Visit. I appreciate your ongoing commitment to your health goals. Please review the following plan we discussed and let me know if I can assist you in the future.  ? ?Screening recommendations/referrals: ?Colonoscopy aged out ?Recommended yearly ophthalmology/optometry visit for glaucoma screening and checkup ?Recommended yearly dental visit for hygiene and checkup ? ?Vaccinations: ?Influenza vaccine up to date ?Pneumococcal vaccine up to date ?Tdap vaccine DUE- recommend to get at your local pharmacy    ?Shingles vaccine up to date   ? ?Advanced directives: to bring healthcare POA copy to place on file.  ? ?Conditions/risks identified: advance age, fall risk ? ?Next appointment: yearly  ? ?Preventive Care 81 Years and Older, Male ?Preventive care refers to lifestyle choices and visits with your health care provider that can promote health and wellness. ?What does preventive care include? ?A yearly physical exam. This is also called an annual well check. ?Dental exams once or twice a year. ?Routine eye exams. Ask your health care provider how often you should have your eyes checked. ?Personal lifestyle choices, including: ?Daily care of your teeth and gums. ?Regular physical activity. ?Eating a healthy diet. ?Avoiding tobacco and drug use. ?Limiting alcohol use. ?Practicing safe sex. ?Taking low doses of aspirin every day. ?Taking vitamin and mineral supplements as recommended by your health care provider. ?What happens during an annual well check? ?The services and screenings done by your health care provider during your annual well check will depend on your age, overall health, lifestyle risk factors, and family history of disease. ?Counseling  ?Your health care provider may ask you questions about your: ?Alcohol use. ?Tobacco use. ?Drug use. ?Emotional well-being. ?Home and relationship well-being. ?Sexual activity. ?Eating  habits. ?History of falls. ?Memory and ability to understand (cognition). ?Work and work Statistician. ?Screening  ?You may have the following tests or measurements: ?Height, weight, and BMI. ?Blood pressure. ?Lipid and cholesterol levels. These may be checked every 5 years, or more frequently if you are over 21 years old. ?Skin check. ?Lung cancer screening. You may have this screening every year starting at age 11 if you have a 30-pack-year history of smoking and currently smoke or have quit within the past 15 years. ?Fecal occult blood test (FOBT) of the stool. You may have this test every year starting at age 29. ?Flexible sigmoidoscopy or colonoscopy. You may have a sigmoidoscopy every 5 years or a colonoscopy every 10 years starting at age 57. ?Prostate cancer screening. Recommendations will vary depending on your family history and other risks. ?Hepatitis C blood test. ?Hepatitis B blood test. ?Sexually transmitted disease (STD) testing. ?Diabetes screening. This is done by checking your blood sugar (glucose) after you have not eaten for a while (fasting). You may have this done every 1-3 years. ?Abdominal aortic aneurysm (AAA) screening. You may need this if you are a current or former smoker. ?Osteoporosis. You may be screened starting at age 72 if you are at high risk. ?Talk with your health care provider about your test results, treatment options, and if necessary, the need for more tests. ?Vaccines  ?Your health care provider may recommend certain vaccines, such as: ?Influenza vaccine. This is recommended every year. ?Tetanus, diphtheria, and acellular pertussis (Tdap, Td) vaccine. You may need a Td booster every 10 years. ?Zoster vaccine. You may need this after age 56. ?Pneumococcal 13-valent conjugate (PCV13) vaccine. One dose is recommended after age 29. ?Pneumococcal polysaccharide (PPSV23) vaccine. One  dose is recommended after age 67. ?Talk to your health care provider about which screenings and  vaccines you need and how often you need them. ?This information is not intended to replace advice given to you by your health care provider. Make sure you discuss any questions you have with your health care provider. ?Document Released: 06/14/2015 Document Revised: 02/05/2016 Document Reviewed: 03/19/2015 ?Elsevier Interactive Patient Education ? 2017 [employer]. ? ?Fall Prevention in the Home ?Falls can cause injuries. They can happen to people of all ages. There are many things you can do to make your home safe and to help prevent falls. ?What can I do on the outside of my home? ?Regularly fix the edges of walkways and driveways and fix any cracks. ?Remove anything that might make you trip as you walk through a door, such as a raised step or threshold. ?Trim any bushes or trees on the path to your home. ?Use bright outdoor lighting. ?Clear any walking paths of anything that might make someone trip, such as rocks or tools. ?Regularly check to see if handrails are loose or broken. Make sure that both sides of any steps have handrails. ?Any raised decks and porches should have guardrails on the edges. ?Have any leaves, snow, or ice cleared regularly. ?Use sand or salt on walking paths during winter. ?Clean up any spills in your garage right away. This includes oil or grease spills. ?What can I do in the bathroom? ?Use night lights. ?Install grab bars by the toilet and in the tub and shower. Do not use towel bars as grab bars. ?Use non-skid mats or decals in the tub or shower. ?If you need to sit down in the shower, use a plastic, non-slip stool. ?Keep the floor dry. Clean up any water that spills on the floor as soon as it happens. ?Remove soap buildup in the tub or shower regularly. ?Attach bath mats securely with double-sided non-slip rug tape. ?Do not have throw rugs and other things on the floor that can make you trip. ?What can I do in the bedroom? ?Use night lights. ?Make sure that you have a light by your  bed that is easy to reach. ?Do not use any sheets or blankets that are too big for your bed. They should not hang down onto the floor. ?Have a firm chair that has side arms. You can use this for support while you get dressed. ?Do not have throw rugs and other things on the floor that can make you trip. ?What can I do in the kitchen? ?Clean up any spills right away. ?Avoid walking on wet floors. ?Keep items that you use a lot in easy-to-reach places. ?If you need to reach something above you, use a strong step stool that has a grab bar. ?Keep electrical cords out of the way. ?Do not use floor polish or wax that makes floors slippery. If you must use wax, use non-skid floor wax. ?Do not have throw rugs and other things on the floor that can make you trip. ?What can I do with my stairs? ?Do not leave any items on the stairs. ?Make sure that there are handrails on both sides of the stairs and use them. Fix handrails that are broken or loose. Make sure that handrails are as long as the stairways. ?Check any carpeting to make sure that it is firmly attached to the stairs. Fix any carpet that is loose or worn. ?Avoid having throw rugs at the top or bottom of the  stairs. If you do have throw rugs, attach them to the floor with carpet tape. ?Make sure that you have a light switch at the top of the stairs and the bottom of the stairs. If you do not have them, ask someone to add them for you. ?What else can I do to help prevent falls? ?Wear shoes that: ?Do not have high heels. ?Have rubber bottoms. ?Are comfortable and fit you well. ?Are closed at the toe. Do not wear sandals. ?If you use a stepladder: ?Make sure that it is fully opened. Do not climb a closed stepladder. ?Make sure that both sides of the stepladder are locked into place. ?Ask someone to hold it for you, if possible. ?Clearly mark and make sure that you can see: ?Any grab bars or handrails. ?First and last steps. ?Where the edge of each step is. ?Use tools that  help you move around (mobility aids) if they are needed. These include: ?Canes. ?Walkers. ?Scooters. ?Crutches. ?Turn on the lights when you go into a dark area. Replace any light bulbs as soon as they burn out. ?Set

## 2021-08-18 NOTE — Progress Notes (Signed)
? ?  This service is provided via telemedicine ? ?No vital signs collected/recorded due to the encounter was a telemedicine visit.  ? ?Location of patient (ex: home, work):  Home ? ?Patient consents to a telephone visit: Yes, see telephone visit dated 08/18/2021 ? ?Location of the provider (ex: office, home):  [facility], Office  ? ?Name of any referring provider:  N/A ? ?Names of all persons participating in the telemedicine service and their role in the encounter:  S.Chrae B/CMA, Sherrie Mustache, NP, Lavella Lemons (caregiver) and Patient  ? ?Time spent on call:  5 min with medical assistant  ? ?

## 2021-08-18 NOTE — Telephone Encounter (Signed)
[name], Justin [name] are scheduled for a virtual visit with your provider today.   ? ?Just as we do with appointments in the office, we must obtain your consent to participate.  Your consent will be active for this visit and any virtual visit you may have with one of our providers in the next 365 days.   ? ?If you have a MyChart account, I can also send a copy of this consent to you electronically.  All virtual visits are billed to your insurance company just like a traditional visit in the office.  As this is a virtual visit, video technology does not allow for your provider to perform a traditional examination.  This may limit your provider's ability to fully assess your condition.  If your provider identifies any concerns that need to be evaluated in person or the need to arrange testing such as labs, EKG, etc, we will make arrangements to do so.   ? ?Although advances in technology are sophisticated, we cannot ensure that it will always work on either your end or our end.  If the connection with a video visit is poor, we may have to switch to a telephone visit.  With either a video or telephone visit, we are not always able to ensure that we have a secure connection.   I need to obtain your verbal consent now.   Are you willing to proceed with your visit today?  ? ?Justin [name] has provided verbal consent on 08/18/2021 for a virtual visit (video or telephone). ? ? ?Leigh Aurora Stinn[place], [place] ?08/18/2021  8:46 AM ? ? ?

## 2021-08-19 LAB — CBC WITH DIFFERENTIAL/PLATELET
Absolute Monocytes: 857 cells/uL (ref 200–950)
Basophils Absolute: 59 cells/uL (ref 0–200)
Basophils Relative: 0.7 %
Eosinophils Absolute: 168 cells/uL (ref 15–500)
Eosinophils Relative: 2 %
HCT: 45.4 % (ref 38.5–50.0)
Hemoglobin: 15.3 g/dL (ref 13.2–17.1)
Lymphs Abs: 2671 cells/uL (ref 850–3900)
MCH: 30.7 pg (ref 27.0–33.0)
MCHC: 33.7 g/dL (ref 32.0–36.0)
MCV: 91.2 fL (ref 80.0–100.0)
MPV: 11.3 fL (ref 7.5–12.5)
Monocytes Relative: 10.2 %
Neutro Abs: 4645 cells/uL (ref 1500–7800)
Neutrophils Relative %: 55.3 %
Platelets: 239 10*3/uL (ref 140–400)
RBC: 4.98 10*6/uL (ref 4.20–5.80)
RDW: 12.6 % (ref 11.0–15.0)
Total Lymphocyte: 31.8 %
WBC: 8.4 10*3/uL (ref 3.8–10.8)

## 2021-08-19 LAB — COMPLETE METABOLIC PANEL WITH GFR
AG Ratio: 1.6 (calc) (ref 1.0–2.5)
ALT: 27 U/L (ref 9–46)
AST: 18 U/L (ref 10–35)
Albumin: 4.1 g/dL (ref 3.6–5.1)
Alkaline phosphatase (APISO): 121 U/L (ref 35–144)
BUN: 17 mg/dL (ref 7–25)
CO2: 24 mmol/L (ref 20–32)
Calcium: 9.5 mg/dL (ref 8.6–10.3)
Chloride: 103 mmol/L (ref 98–110)
Creat: 1.2 mg/dL (ref 0.70–1.22)
Globulin: 2.6 g/dL (calc) (ref 1.9–3.7)
Glucose, Bld: 122 mg/dL — ABNORMAL HIGH (ref 65–99)
Potassium: 4.5 mmol/L (ref 3.5–5.3)
Sodium: 140 mmol/L (ref 135–146)
Total Bilirubin: 0.4 mg/dL (ref 0.2–1.2)
Total Protein: 6.7 g/dL (ref 6.1–8.1)
eGFR: 61 mL/min/{1.73_m2} (ref >=60)

## 2021-08-19 LAB — LIPID PANEL
Cholesterol: 179 mg/dL (ref <200)
HDL: 37 mg/dL — ABNORMAL LOW (ref >=40)
LDL Cholesterol (Calc): 96 mg/dL (calc)
Non-HDL Cholesterol (Calc): 142 mg/dL (calc) — ABNORMAL HIGH (ref <130)
Total CHOL/HDL Ratio: 4.8 (calc) (ref <5.0)
Triglycerides: 343 mg/dL — ABNORMAL HIGH (ref <150)

## 2021-08-19 LAB — TEST AUTHORIZATION

## 2021-08-19 LAB — HEMOGLOBIN A1C
Hgb A1c MFr Bld: 6.1 % of total Hgb — ABNORMAL HIGH (ref <5.7)
Mean Plasma Glucose: 128 mg/dL
eAG (mmol/L): 7.1 mmol/L

## 2021-08-29 ENCOUNTER — Encounter: Payer: Self-pay | Admitting: Nurse Practitioner

## 2021-08-29 DIAGNOSIS — L739 Follicular disorder, unspecified: Secondary | ICD-10-CM

## 2021-08-29 MED ORDER — MUPIROCIN CALCIUM 2 % EX CREA: 1.0000 "application " | TOPICAL_CREAM | Freq: Two times a day (BID) | CUTANEOUS | 0 refills | Status: DC

## 2021-08-29 NOTE — Telephone Encounter (Signed)
Patient requested refill. Pended prescription for approval.  ?

## 2021-09-14 ENCOUNTER — Other Ambulatory Visit: Payer: Self-pay | Admitting: Nurse Practitioner

## 2021-09-14 DIAGNOSIS — K2100 Gastro-esophageal reflux disease with esophagitis, without bleeding: Secondary | ICD-10-CM

## 2021-09-24 ENCOUNTER — Other Ambulatory Visit: Payer: Self-pay | Admitting: Nurse Practitioner

## 2021-11-06 ENCOUNTER — Other Ambulatory Visit: Payer: Self-pay | Admitting: Nurse Practitioner

## 2021-11-06 DIAGNOSIS — G119 Hereditary ataxia, unspecified: Secondary | ICD-10-CM

## 2021-11-06 NOTE — Telephone Encounter (Signed)
Patient has request refill on medication Lyrica. Patient last refill dated 08/15/2021. Patient has no Non Opioid Contract on file. Patient has upcoming appointment dated 12/15/2021. Sign Contract added to patient appointment note. Medication pend and sent to PCP [name], NP .

## 2021-11-14 ENCOUNTER — Other Ambulatory Visit: Payer: Self-pay | Admitting: Nurse Practitioner

## 2021-11-14 DIAGNOSIS — G119 Hereditary ataxia, unspecified: Secondary | ICD-10-CM

## 2021-11-14 DIAGNOSIS — R270 Ataxia, unspecified: Secondary | ICD-10-CM

## 2021-12-11 ENCOUNTER — Encounter: Payer: Self-pay | Admitting: Nurse Practitioner

## 2021-12-12 ENCOUNTER — Other Ambulatory Visit: Payer: Self-pay | Admitting: Nurse Practitioner

## 2021-12-12 DIAGNOSIS — I10 Essential (primary) hypertension: Secondary | ICD-10-CM

## 2021-12-15 ENCOUNTER — Encounter: Payer: Self-pay | Admitting: Nurse Practitioner

## 2021-12-15 ENCOUNTER — Ambulatory Visit (INDEPENDENT_AMBULATORY_CARE_PROVIDER_SITE_OTHER): Payer: Medicare Other | Admitting: Nurse Practitioner

## 2021-12-15 VITALS — BP 128/88 | HR 57 | Temp 97.1°F | Resp 20 | Ht 67.0 in | Wt 182.6 lb

## 2021-12-15 DIAGNOSIS — E782 Mixed hyperlipidemia: Secondary | ICD-10-CM | POA: Diagnosis not present

## 2021-12-15 DIAGNOSIS — L739 Follicular disorder, unspecified: Secondary | ICD-10-CM

## 2021-12-15 DIAGNOSIS — G118 Other hereditary ataxias: Secondary | ICD-10-CM

## 2021-12-15 DIAGNOSIS — K219 Gastro-esophageal reflux disease without esophagitis: Secondary | ICD-10-CM | POA: Diagnosis not present

## 2021-12-15 DIAGNOSIS — R2689 Other abnormalities of gait and mobility: Secondary | ICD-10-CM

## 2021-12-15 DIAGNOSIS — E7800 Pure hypercholesterolemia, unspecified: Secondary | ICD-10-CM | POA: Diagnosis not present

## 2021-12-15 DIAGNOSIS — N3281 Overactive bladder: Secondary | ICD-10-CM | POA: Diagnosis not present

## 2021-12-15 DIAGNOSIS — R739 Hyperglycemia, unspecified: Secondary | ICD-10-CM | POA: Diagnosis not present

## 2021-12-15 DIAGNOSIS — I10 Essential (primary) hypertension: Secondary | ICD-10-CM

## 2021-12-15 DIAGNOSIS — G119 Hereditary ataxia, unspecified: Secondary | ICD-10-CM

## 2021-12-15 MED ORDER — DEXLANSOPRAZOLE 30 MG PO CPDR: 30.0000 mg | DELAYED_RELEASE_CAPSULE | Freq: Every day | ORAL | 1 refills | Status: DC

## 2021-12-15 NOTE — Progress Notes (Unsigned)
Careteam: Patient Care Team: [name], NP as PCP - General (Geriatric Medicine) Nahser, Wonda Cheng, MD as PCP - Cardiology (Cardiology) Tat, Eustace Quail, DO as Consulting Physician (Neurology)  PLACE OF SERVICE:  [place]  Advanced Directive information    Allergies  Allergen Reactions   Effexor [Venlafaxine Hcl]    Serzone [Nefazodone]    Vivactil [Protriptyline Hcl]     Chief Complaint  Patient presents with   Medical Management of Chronic Issues    Patient presents today for a 4 month follow-up.     HPI: Patient is a 81 y.o. male for routine follow up.   Reports vision has been "fuzzy" went to eye doctor and said everything looked good. Was concern about blood pressure but good today . Reports this is just occasionally.   [place] on what he eats. He is out of dexilant   Does not take any medication for cholesterol - diet controlled.    OAB- using oxybutin , generally gets up around 1 time at night.   Going to the beach every 2 weeks, able to enjoy this.  He is using a wheelchair because he gets too unsteady with his walker.  He is able to navigate with wheelchair more.   Continues to get painful bumps under skin, Bactroban did not help, also itch, used calamine but did not help, now using "boil cream" which has been effective. Using dial shower soap. They will come up and then go away. Currently only has 1 or 2 small areas.   Review of Systems:  Review of Systems  Constitutional:  Negative for chills, fever and weight loss.  HENT:  Negative for tinnitus.   Respiratory:  Negative for cough, sputum production and shortness of breath.   Cardiovascular:  Negative for chest pain, palpitations and leg swelling.  Gastrointestinal:  Positive for heartburn. Negative for abdominal pain, constipation and diarrhea.  Genitourinary:  Positive for frequency. Negative for dysuria and urgency.  Musculoskeletal:  Negative for back pain, falls, joint pain and  myalgias.  Skin:  Positive for itching.  Neurological:  Negative for dizziness and headaches.  Psychiatric/Behavioral:  Negative for depression and memory loss. The patient does not have insomnia.     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   Diaphragmatic hernia without mention of obstruction or gangrene 1982   Hiatal hernia   Diplopia 2006   Disturbance of skin sensation 2003   Diverticulosis of colon (without mention of hemorrhage) 1997   Dizziness and giddiness 2001   Dysphagia, unspecified(787.20) 1999 and   Esophagitis, unspecified 1997   GERD (gastroesophageal reflux disease) 2003   Hypertension 2003   Impotence of organic origin 1999   Lack of coordination September 07, 2007   Lumbago 2003   Migraine with aura, without mention of intractable migraine without mention of status migrainosus 1997   Myalgia and myositis, unspecified 1999   Other and unspecified hyperlipidemia 2003   Other disorders of vitreous 2003   Other malaise and fatigue 2003   Other seborrheic keratosis 2013   Other seborrheic keratosis 04/13/09   Pain in joint, site unspecified 04/13/12   Personal  history of fall 04/15/11   Restless legs syndrome (RLS) 1997   Spinocerebellar disease, 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 Donne Hazel   C3-4 anterior cervical surgery  1999   Dr. Hal Neer   CARDIAC CATHETERIZATION  2001   Dr. Glade Lloyd   COLONOSCOPY  07/20/07   Dr. Sharlett Iles   EYE SURGERY Bilateral 2015   cataracts   LAMINOTOMY / EXCISION DISK  POSTERIOR CERVICAL SPINE     Spinal Disk (?4-5)   Social History:   reports that he has never smoked. He quit smokeless tobacco use about 42 years ago.  His smokeless tobacco use included chew. He reports that he does not currently use alcohol. He reports that he does not use drugs.  Family History  Problem Relation Age of Onset   Heart disease Father        SCA with multiple family members with SCA   Diabetes Neg Hx    Colon cancer Neg Hx    Colon polyps Neg Hx    Esophageal cancer Neg Hx    Kidney disease Neg Hx    Gallbladder disease Neg Hx     Medications: Patient's Medications  New Prescriptions   No medications on file  Previous Medications   ACETAMINOPHEN (TYLENOL 8 HOUR ARTHRITIS PAIN) 650 MG CR TABLET    Take 1 tablet (650 mg total) by mouth 2 (two) times daily.   AMLODIPINE (NORVASC) 5 MG TABLET    TAKE 1 TABLET BY MOUTH EVERY DAY   DEXILANT 30 MG CAPSULE DR    TAKE 1 CAPSULE BY MOUTH EVERY DAY   HYDROCHLOROTHIAZIDE (HYDRODIURIL) 12.5 MG TABLET    TAKE 1 TABLET BY MOUTH EVERY DAY   LOSARTAN (COZAAR) 100 MG TABLET    TAKE 1 TABLET (100 MG TOTAL) BY MOUTH DAILY   MUPIROCIN CREAM (BACTROBAN) 2 %    Apply 1 application. topically 2 (two) times daily.   OXYBUTYNIN (DITROPAN) 5 MG TABLET    TAKE 1 TABLET BY MOUTH EVERY DAY   POTASSIUM CHLORIDE (KLOR-CON) 10 MEQ TABLET    Take 1 tablet (10 mEq total) by mouth daily.   PREGABALIN (LYRICA) 25 MG CAPSULE    TAKE 1 CAPSULE BY MOUTH 3 TIMES A DAY   PROPRANOLOL (INDERAL) 40 MG TABLET    TAKE 1 TABLET BY MOUTH TWICE A DAY  Modified Medications   No medications on file  Discontinued Medications   No medications on file    Physical Exam:  Vitals:   12/15/21 1336  BP: 128/88  Pulse: (!) 57  Resp: 20  Temp: (!) 97.1 F (36.2 C)  SpO2: 97%  Weight: 182 lb 9.6 oz (82.8 kg)  Height: 5' 7"  (1.702 m)   Body mass index is 28.6 kg/m. Wt Readings from Last 3 Encounters:  12/15/21 182 lb 9.6 oz (82.8 kg)  08/15/21 186 lb (84.4  kg)  04/07/21 183 lb (83 kg)    Physical Exam Constitutional:      General: He is not in acute distress.    Appearance: He is well-developed. He is not diaphoretic.  HENT:     Head: Normocephalic and atraumatic.     Right Ear: External ear normal.     Left Ear: External ear normal.     Mouth/Throat:     Pharynx: No oropharyngeal  exudate.  Eyes:     Conjunctiva/sclera: Conjunctivae normal.     Pupils: Pupils are equal, round, and reactive to light.  Cardiovascular:     Rate and Rhythm: Normal rate and regular rhythm.     Heart sounds: Normal heart sounds.  Pulmonary:     Effort: Pulmonary effort is normal.     Breath sounds: Normal breath sounds.  Abdominal:     General: Bowel sounds are normal.     Palpations: Abdomen is soft.  Musculoskeletal:        General: No tenderness.     Cervical back: Normal range of motion and neck supple.     Right lower leg: No edema.     Left lower leg: No edema.  Skin:    General: Skin is warm and dry.     Comments: Small pinpoint red area to left side slightly raised without heat or drainage that pt reports can be tender and itchy at times.   Neurological:     Mental Status: He is alert and oriented to person, place, and time.     Labs reviewed: Basic Metabolic Panel: Recent Labs    12/31/20 0830 04/03/21 0824 08/12/21 0843  NA 139 140 140  K 4.6 4.6 4.5  CL 102 104 103  CO2 28 26 24   GLUCOSE 107* 104* 122*  BUN 17 21 17   CREATININE 1.32* 1.20 1.20  CALCIUM 9.6 9.6 9.5   Liver Function Tests: Recent Labs    12/31/20 0830 04/03/21 0824 08/12/21 0843  AST 17 15 18   ALT 19 21 27   BILITOT 0.6 0.5 0.4  PROT 6.5 6.6 6.7   No results for input(s): "LIPASE", "AMYLASE" in the last 8760 hours. No results for input(s): "AMMONIA" in the last 8760 hours. CBC: Recent Labs    12/31/20 0830 08/12/21 0843  WBC 8.1 8.4  NEUTROABS 4,755 4,645  HGB 15.5 15.3  HCT 46.0 45.4  MCV 90.2 91.2  PLT 205 239   Lipid Panel: Recent  Labs    12/31/20 0830 04/03/21 0824 08/12/21 0843  CHOL 203* 199 179  HDL 42 40 37*  LDLCALC 118* 118* 96  TRIG 301* 290* 343*  CHOLHDL 4.8 5.0* 4.8   TSH: No results for input(s): "TSH" in the last 8760 hours. A1C: Lab Results  Component Value Date   HGBA1C 6.1 (H) 08/12/2021     Assessment/Plan 1. Gastroesophageal reflux disease without esophagitis -improved symptoms on dexilant, continue dietary modifications - Dexlansoprazole (DEXILANT) 30 MG capsule DR; Take 1 capsule (30 mg total) by mouth daily.  Dispense: 90 capsule; Refill: 1  2. Overactive bladder Continues on oxybutynin  3. Essential hypertension -Blood pressure well controlled Continue current medications Recheck metabolic panel - CMP with eGFR([facility]) - CBC with Differential/Platelet  4. Mixed hyperlipidemia -with elevated triglycerides. Continue dietary modifications and will follow up today.  - Lipid panel  5. Hyperglycemia -continues with dietary modifications.  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 Needing more assistance with gait. Vision changes may be associated to disease progression.  - DME Wheelchair manual  7. 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   9. Folliculitis Overall improved but continues to have occasional spots. Using OTC cream which has worked better than Bactroban.    Return in about 4 months (around 04/17/2022) for 0. Routine follow up and fasting  labs.  Carlos American. [place], [facility] [phone]

## 2021-12-16 LAB — CBC WITH DIFFERENTIAL/PLATELET
Absolute Monocytes: 783 cells/uL (ref 200–950)
Basophils Absolute: 62 cells/uL (ref 0–200)
Basophils Relative: 0.7 %
Eosinophils Absolute: 229 cells/uL (ref 15–500)
Eosinophils Relative: 2.6 %
HCT: 44.5 % (ref 38.5–50.0)
Hemoglobin: 14.8 g/dL (ref 13.2–17.1)
Lymphs Abs: 3054 cells/uL (ref 850–3900)
MCH: 30.2 pg (ref 27.0–33.0)
MCHC: 33.3 g/dL (ref 32.0–36.0)
MCV: 90.8 fL (ref 80.0–100.0)
MPV: 11.3 fL (ref 7.5–12.5)
Monocytes Relative: 8.9 %
Neutro Abs: 4673 cells/uL (ref 1500–7800)
Neutrophils Relative %: 53.1 %
Platelets: 229 10*3/uL (ref 140–400)
RBC: 4.9 10*6/uL (ref 4.20–5.80)
RDW: 13.3 % (ref 11.0–15.0)
Total Lymphocyte: 34.7 %
WBC: 8.8 10*3/uL (ref 3.8–10.8)

## 2021-12-16 LAB — COMPLETE METABOLIC PANEL WITH GFR
AG Ratio: 2 (calc) (ref 1.0–2.5)
ALT: 33 U/L (ref 9–46)
AST: 21 U/L (ref 10–35)
Albumin: 4.5 g/dL (ref 3.6–5.1)
Alkaline phosphatase (APISO): 120 U/L (ref 35–144)
BUN: 16 mg/dL (ref 7–25)
CO2: 25 mmol/L (ref 20–32)
Calcium: 10.2 mg/dL (ref 8.6–10.3)
Chloride: 105 mmol/L (ref 98–110)
Creat: 1.08 mg/dL (ref 0.70–1.22)
Globulin: 2.3 g/dL (calc) (ref 1.9–3.7)
Glucose, Bld: 92 mg/dL (ref 65–99)
Potassium: 4.4 mmol/L (ref 3.5–5.3)
Sodium: 141 mmol/L (ref 135–146)
Total Bilirubin: 0.4 mg/dL (ref 0.2–1.2)
Total Protein: 6.8 g/dL (ref 6.1–8.1)
eGFR: 69 mL/min/{1.73_m2} (ref >=60)

## 2021-12-16 LAB — LIPID PANEL
Cholesterol: 171 mg/dL (ref <200)
HDL: 39 mg/dL — ABNORMAL LOW (ref >=40)
Non-HDL Cholesterol (Calc): 132 mg/dL (calc) — ABNORMAL HIGH (ref <130)
Total CHOL/HDL Ratio: 4.4 (calc) (ref <5.0)
Triglycerides: 415 mg/dL — ABNORMAL HIGH (ref <150)

## 2021-12-16 LAB — HEMOGLOBIN A1C
Hgb A1c MFr Bld: 6 % of total Hgb — ABNORMAL HIGH (ref <5.7)
Mean Plasma Glucose: 126 mg/dL
eAG (mmol/L): 7 mmol/L

## 2021-12-22 ENCOUNTER — Other Ambulatory Visit: Payer: Self-pay | Admitting: Nurse Practitioner

## 2021-12-22 DIAGNOSIS — K2100 Gastro-esophageal reflux disease with esophagitis, without bleeding: Secondary | ICD-10-CM

## 2021-12-25 ENCOUNTER — Other Ambulatory Visit: Payer: Self-pay | Admitting: Nurse Practitioner

## 2021-12-25 DIAGNOSIS — K219 Gastro-esophageal reflux disease without esophagitis: Secondary | ICD-10-CM

## 2021-12-25 NOTE — Telephone Encounter (Signed)
Rx sent in for protonix, please notify pt and I would recommend him being evaluated by Gastroenterology at this time due to his uncontrolled GERD and now insurance not covering Rx for dexilant.

## 2021-12-25 NOTE — Telephone Encounter (Signed)
Patient notified and agreed.  

## 2021-12-25 NOTE — Telephone Encounter (Signed)
Pharmacy faxed refill requesting with message stating:   Pharmacy comment: Alternative Requested:INSURANCE WILL NO LONGER COVER BRAND OR GENERIC.  Needs alternative for Dexilant.  Please Advise.

## 2022-03-23 ENCOUNTER — Other Ambulatory Visit: Payer: Self-pay | Admitting: Nurse Practitioner

## 2022-03-23 DIAGNOSIS — K219 Gastro-esophageal reflux disease without esophagitis: Secondary | ICD-10-CM

## 2022-04-17 ENCOUNTER — Encounter: Payer: Self-pay | Admitting: Nurse Practitioner

## 2022-04-17 ENCOUNTER — Ambulatory Visit (INDEPENDENT_AMBULATORY_CARE_PROVIDER_SITE_OTHER): Payer: Medicare Other | Admitting: Nurse Practitioner

## 2022-04-17 VITALS — BP 122/76 | HR 64 | Temp 97.1°F | Ht 67.0 in | Wt 179.0 lb

## 2022-04-17 DIAGNOSIS — G119 Hereditary ataxia, unspecified: Secondary | ICD-10-CM

## 2022-04-17 DIAGNOSIS — E782 Mixed hyperlipidemia: Secondary | ICD-10-CM

## 2022-04-17 DIAGNOSIS — I10 Essential (primary) hypertension: Secondary | ICD-10-CM | POA: Diagnosis not present

## 2022-04-17 DIAGNOSIS — R739 Hyperglycemia, unspecified: Secondary | ICD-10-CM | POA: Diagnosis not present

## 2022-04-17 DIAGNOSIS — M79671 Pain in right foot: Secondary | ICD-10-CM

## 2022-04-17 DIAGNOSIS — R2689 Other abnormalities of gait and mobility: Secondary | ICD-10-CM

## 2022-04-17 NOTE — Patient Instructions (Addendum)
Frozen water bottle 2-3 times a day Take aleve twice daily for 7 days then stop.  Just tylenol as needed   Low triglyceride diet.    Make lab appt for week of 11/27

## 2022-04-17 NOTE — Progress Notes (Signed)
Careteam: Patient Care Team: [name], NP as PCP - General (Geriatric Medicine) Nahser, Wonda Cheng, MD as PCP - Cardiology (Cardiology) Tat, Eustace Quail, DO as Consulting Physician (Neurology)  PLACE OF SERVICE:  [place] Directive information Does Patient Have a Medical Advance Directive?: Yes, Type of Advance Directive: Out of facility DNR (pink MOST or yellow form), Pre-existing out of facility DNR order (yellow form or pink MOST form): Yellow form placed in chart (order not valid for inpatient use), Does patient want to make changes to medical advance directive?: No - Patient declined  Allergies  Allergen Reactions   Effexor [Venlafaxine Hcl]    Serzone [Nefazodone]    Vivactil [Protriptyline Hcl]     Chief Complaint  Patient presents with   Medical Management of Chronic Issues    4 month follow-up. Discuss need for covid boosters and td/tdap. Patient denies receiving any vaccines since last visit. Foot concerns x 2 months       HPI: Patient is a 81 y.o. male for routine follow up.   He has had foot issues for ~2 months. Got a brace but makes it hurt more.  Using frozen water bottle which has helped The heel of right foot and left leg.  Left leg with pain at the calf. No swelling  Rides his stationary bike daily- does not stretch  They are getting ready to dramatically change how they eat- elevated cholesterol.   Going to the beach next week for thanksgiving.   Review of Systems:  Review of Systems  Constitutional:  Negative for chills, fever and weight loss.  HENT:  Negative for tinnitus.   Respiratory:  Negative for cough, sputum production and shortness of breath.   Cardiovascular:  Negative for chest pain, palpitations and leg swelling.  Gastrointestinal:  Negative for abdominal pain, constipation, diarrhea and heartburn.  Genitourinary:  Negative for dysuria, frequency and urgency.  Musculoskeletal:  Positive for joint pain and myalgias.  Negative for back pain and falls.  Skin: Negative.   Neurological:  Positive for weakness. Negative for dizziness and headaches.  Psychiatric/Behavioral:  Negative for depression and memory loss. The patient does not have insomnia.     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   Diaphragmatic hernia without mention of obstruction or gangrene 1982   Hiatal hernia   Diplopia 2006   Disturbance of skin sensation 2003   Diverticulosis of colon (without mention of hemorrhage) 1997   Dizziness and giddiness 2001   Dysphagia, unspecified(787.20) 1999 and   Esophagitis, unspecified 1997   GERD (gastroesophageal reflux disease) 2003   Hypertension 2003   Impotence of organic origin 1999   Lack of coordination September 07, 2007   Lumbago 2003   Migraine with aura, without mention of intractable migraine without mention of status migrainosus 1997   Myalgia and myositis, unspecified 1999   Other and unspecified hyperlipidemia 2003   Other disorders of vitreous 2003   Other malaise and fatigue 2003   Other seborrheic keratosis 2013   Other seborrheic keratosis 04/13/09   Pain in joint, site unspecified 04/13/12   Personal history of fall 04/15/11  Restless legs syndrome (RLS) 1997   Spinocerebellar disease, 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 Donne Hazel   C3-4 anterior cervical surgery  1999   Dr. Hal Neer   CARDIAC CATHETERIZATION  2001   Dr. Glade Lloyd   COLONOSCOPY  07/20/07   Dr. Sharlett Iles   EYE SURGERY Bilateral 2015   cataracts    LAMINOTOMY / EXCISION DISK POSTERIOR CERVICAL SPINE     Spinal Disk (?4-5)   Social History:   reports that he has never smoked. He quit smokeless tobacco use about 42 years ago.  His smokeless tobacco use included chew. He reports that he does not currently use alcohol. He reports that he does not use drugs.  Family History  Problem Relation Age of Onset   Heart disease Father        SCA with multiple family members with [facility]   Diabetes Neg Hx    Colon cancer Neg Hx    Colon polyps Neg Hx    Esophageal cancer Neg Hx    Kidney disease Neg Hx    Gallbladder disease Neg Hx     Medications: Patient's Medications  New Prescriptions   No medications on file  Previous Medications   ACETAMINOPHEN (TYLENOL 8 HOUR ARTHRITIS PAIN) 650 MG CR TABLET    Take 1 tablet (650 mg total) by mouth 2 (two) times daily.   AMLODIPINE (NORVASC) 5 MG TABLET    TAKE 1 TABLET BY MOUTH EVERY DAY   HYDROCHLOROTHIAZIDE (HYDRODIURIL) 12.5 MG TABLET    TAKE 1 TABLET BY MOUTH EVERY DAY   LOSARTAN (COZAAR) 100 MG TABLET    TAKE 1 TABLET (100 MG TOTAL) BY MOUTH DAILY   OXYBUTYNIN (DITROPAN) 5 MG TABLET    TAKE 1 TABLET BY MOUTH EVERY DAY   PANTOPRAZOLE (PROTONIX) 40 MG TABLET    TAKE 1 TABLET BY MOUTH EVERY DAY   POTASSIUM CHLORIDE (KLOR-CON) 10 MEQ TABLET    Take 1 tablet (10 mEq total) by mouth daily.   PREGABALIN (LYRICA) 25 MG CAPSULE    TAKE 1 CAPSULE BY MOUTH 3 TIMES A DAY   PROPRANOLOL (INDERAL) 40 MG TABLET    TAKE 1 TABLET BY MOUTH TWICE A DAY  Modified Medications   No medications on file  Discontinued Medications   MUPIROCIN CREAM (BACTROBAN) 2 %    Apply 1 application. topically 2 (two) times daily.    Physical Exam:  Vitals:   04/17/22 1025  BP: 122/76  Pulse: 64  Temp: (!) 97.1 F (36.2 C)  TempSrc: Temporal  SpO2: 98%  Weight: 179 lb (81.2 kg)  Height: '5\' 7"'$  (1.702 m)   Body mass index is 28.04 kg/m. Wt Readings from Last 3 Encounters:  04/17/22 179 lb (81.2 kg)  12/15/21 182 lb  9.6 oz (82.8 kg)  08/15/21 186 lb (84.4 kg)    Physical Exam Constitutional:      General: He is not in acute distress.    Appearance: He is well-developed. He is not diaphoretic.  HENT:     Head: Normocephalic and atraumatic.     Right Ear: External ear normal.     Left Ear: External ear normal.     Mouth/Throat:     Pharynx: No oropharyngeal exudate.  Eyes:     Conjunctiva/sclera: Conjunctivae normal.     Pupils:  Pupils are equal, round, and reactive to light.  Cardiovascular:     Rate and Rhythm: Normal rate and regular rhythm.     Heart sounds: Normal heart sounds.  Pulmonary:     Effort: Pulmonary effort is normal.     Breath sounds: Normal breath sounds.  Abdominal:     General: Bowel sounds are normal.     Palpations: Abdomen is soft.  Musculoskeletal:        General: No tenderness.     Cervical back: Normal range of motion and neck supple.     Right lower leg: No edema.     Left lower leg: No edema.  Skin:    General: Skin is warm and dry.  Neurological:     Mental Status: He is alert and oriented to person, place, and time.     Motor: Weakness present.     Gait: Gait abnormal.     Labs reviewed: Basic Metabolic Panel: Recent Labs    08/12/21 0843 12/15/21 1411  NA 140 141  K 4.5 4.4  CL 103 105  CO2 24 25  GLUCOSE 122* 92  BUN 17 16  CREATININE 1.20 1.08  CALCIUM 9.5 10.2   Liver Function Tests: Recent Labs    08/12/21 0843 12/15/21 1411  AST 18 21  ALT 27 33  BILITOT 0.4 0.4  PROT 6.7 6.8   No results for input(s): "LIPASE", "AMYLASE" in the last 8760 hours. No results for input(s): "AMMONIA" in the last 8760 hours. CBC: Recent Labs    08/12/21 0843 12/15/21 1411  WBC 8.4 8.8  NEUTROABS 4,645 4,673  HGB 15.3 14.8  HCT 45.4 44.5  MCV 91.2 90.8  PLT 239 229   Lipid Panel: Recent Labs    08/12/21 0843 12/15/21 1411  CHOL 179 171  HDL 37* 39*  LDLCALC 96  --   TRIG 343* 415*  CHOLHDL 4.8 4.4   TSH: No results for  input(s): "TSH" in the last 8760 hours. A1C: Lab Results  Component Value Date   HGBA1C 6.0 (H) 12/15/2021     Assessment/Plan .1. Essential hypertension -Blood pressure well controlled, goal bp <140/90 Continue current medications and dietary modifications follow metabolic panel - COMPLETE METABOLIC PANEL WITH GFR; Future - CBC with Differential/Platelet; Future  2. 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 Hyperglycemia -dietary modifications encouraged - Hemoglobin A1c; Future  5. Imbalance Due to progression of spinocerebellar disease -he has tightness in leg as well. - Ambulatory referral to Physical Therapy to evaluate and treat  6. Right foot pain Frozen water bottle 2-3 times a day Take aleve twice daily for 7 days then stop.  Just tylenol as needed  - Ambulatory referral to Podiatry  7. Leg pain Discussed stretching and will get PT referral at this time No swelling, redness or warmth. Discussed follow up if symptoms worsen.   Return in about 6 months (around 10/16/2022) for routine follow up. Will get fasting labs in 2 weeks.  Carlos American. [place], [facility] [phone]

## 2022-04-20 ENCOUNTER — Other Ambulatory Visit: Payer: Self-pay

## 2022-04-20 DIAGNOSIS — I7102 Dissection of abdominal aorta: Secondary | ICD-10-CM

## 2022-04-29 ENCOUNTER — Encounter: Payer: Self-pay | Admitting: Physical Therapy

## 2022-04-29 ENCOUNTER — Ambulatory Visit: Payer: Medicare Other | Attending: Nurse Practitioner | Admitting: Physical Therapy

## 2022-04-29 DIAGNOSIS — R260 Ataxic gait: Secondary | ICD-10-CM | POA: Diagnosis not present

## 2022-04-29 DIAGNOSIS — R2689 Other abnormalities of gait and mobility: Secondary | ICD-10-CM | POA: Diagnosis not present

## 2022-04-29 DIAGNOSIS — G119 Hereditary ataxia, unspecified: Secondary | ICD-10-CM | POA: Diagnosis not present

## 2022-04-29 DIAGNOSIS — M79671 Pain in right foot: Secondary | ICD-10-CM | POA: Diagnosis not present

## 2022-04-29 DIAGNOSIS — R262 Difficulty in walking, not elsewhere classified: Secondary | ICD-10-CM | POA: Insufficient documentation

## 2022-04-29 DIAGNOSIS — R2681 Unsteadiness on feet: Secondary | ICD-10-CM | POA: Insufficient documentation

## 2022-04-29 NOTE — Therapy (Addendum)
OUTPATIENT PHYSICAL THERAPY NEURO EVALUATION   Patient Name: Justin [name] MRN: [id number] DOB:Aug 25, 1940, 81 y.o., male Today's Date: 04/29/2022   PCP: Sherrie Mustache  REFERRING PROVIDER: [name], NP  END OF SESSION:  PT End of Session - 04/29/22 1549     Visit Number 1    Number of Visits 17    Date for PT Re-Evaluation 06/24/22    Authorization Type [employer] MCR    Authorization Time Period 04/29/22 to 06/24/22    Progress Note Due on Visit 10    PT Start Time 1317    PT Stop Time 1359    PT Time Calculation (min) 42 min    Equipment Utilized During Treatment Gait belt    Activity Tolerance Patient tolerated treatment well    Behavior During Therapy [facility] for tasks assessed/performed             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   Diaphragmatic hernia without mention of obstruction or gangrene 1982   Hiatal hernia   Diplopia 2006   Disturbance of skin sensation 2003   Diverticulosis of colon (without mention of hemorrhage) 1997   Dizziness and giddiness 2001   Dysphagia, unspecified(787.20) 1999 and   Esophagitis, unspecified 1997   GERD (gastroesophageal reflux disease) 2003   Hypertension 2003   Impotence of organic origin 1999   Lack of coordination September 07, 2007   Lumbago 2003   Migraine with aura, without mention of intractable migraine without mention of status migrainosus 1997   Myalgia and myositis, unspecified 1999   Other and unspecified hyperlipidemia 2003   Other disorders of vitreous 2003   Other malaise and fatigue 2003   Other seborrheic keratosis 2013   Other seborrheic keratosis 04/13/09   Pain in joint, site  unspecified 04/13/12   Personal history of fall 04/15/11   Restless legs syndrome (RLS) 1997   Spinocerebellar disease, 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 Donne Hazel   C3-4 anterior cervical surgery  1999   Dr. Hal Neer   CARDIAC CATHETERIZATION  2001   Dr. Glade Lloyd   COLONOSCOPY  07/20/07   Dr. 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   Internal hemorrhoids without complication 83/41/9622   Arthritis 02/21/2014   Dysarthria 02/21/2014   Seborrheic keratosis 02/21/2014   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   Tinnitus 04/12/2013   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[place]) 10/16/2012   Impotence, 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   Essential hypertension    GERD (gastroesophageal reflux disease)  Dyslipidemia    Lumbago    Dysphagia     ONSET DATE: 04/17/2022  REFERRING DIAG: G11.9 (ICD-10-CM) - 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[place]) R26.89 (ICD-10-CM) - Imbalance  THERAPY DIAG:  Difficulty in walking, not elsewhere classified - Plan: PT plan of care cert/re-cert  Other abnormalities of gait and mobility - Plan: PT plan of care cert/re-cert  Unsteadiness on feet - Plan: PT plan of care cert/re-cert  Pain of right heel - Plan: PT plan of care cert/re-cert  Ataxic gait - Plan: PT plan of care cert/re-cert  Rationale for Evaluation and Treatment:  Rehabilitation  SUBJECTIVE:                                                                                                                                                                                             SUBJECTIVE STATEMENT: Having trouble with heel on the right side, it hurts even when pressure isn't applied. Balance issues are coming from the ataxia. We've not seen the foot doctor yet. Able to walk to the dinner table, but mostly stays in [facility]; has to go up one single step to get to the dining room using a walker. No falls or close calls with falling. Would definitely like to work on walking and balance while in PT. Was diagnosed with spinocerebellar issues around 2010. Balance really started to get off about 2 years ago. Foot really bothers me when I sleep and feels better when elevated, tried compression sleeve for foot/ankle but this hurt as it applied too much pressure   Pt accompanied by:  caregiver   PERTINENT HISTORY: Assessment/Plan .1. Essential hypertension -Blood pressure well controlled, goal bp <140/90 Continue current medications and dietary modifications follow metabolic panel - COMPLETE METABOLIC PANEL WITH GFR; Future - CBC with Differential/Platelet; Future   2. 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 Hyperglycemia -dietary modifications encouraged - Hemoglobin A1c; Future   5. Imbalance Due to progression of spinocerebellar disease -he has tightness in leg as well. - Ambulatory referral to Physical Therapy to evaluate and treat   6. Right foot pain Frozen water bottle 2-3 times a day Take aleve twice daily for 7 days then stop.  Just tylenol as needed  - Ambulatory referral to Podiatry   7. Leg pain Discussed stretching and will get PT  referral at this time No swelling, redness or warmth. Discussed follow up if symptoms worsen.    Return in about 6 months (around 10/16/2022)  for routine follow up. Will get fasting labs in 2 weeks.  Carlos American. Dewaine Oats, AGNP  PAIN:  Are you having pain? Yes: NPRS scale: 2/10; at worst when sleeping can get to 10/10 Pain location: R heel  Pain description: sort of a gout like pain, very sensitive  Aggravating factors: being still during sleep  Relieving factors: elevating foot   PRECAUTIONS: Fall  WEIGHT BEARING RESTRICTIONS: No  FALLS: Has patient fallen in last 6 months? No; FOF (+), not hesitant to leave the house   LIVING ENVIRONMENT: Lives with:  has caregiver with him 24/7 Lives in: House/apartment Stairs: no STE, step up into dining room from living room Has following equipment at home: Single point cane, Walker - 2 wheeled, Wheelchair (manual), Grab bars, and 3 wheeled walker, urinal, bed rails   PLOF: Independent with basic ADLs, Requires assistive device for independence, Needs assistance with gait, and Needs assistance with transfers  PATIENT GOALS: reduce pain in heel, be able to get around better in general   OBJECTIVE:   DIAGNOSTIC FINDINGS: IMPRESSION: No evidence of left lower extremity deep venous thrombosis. Potential subtle superficial thrombophlebitis of a superficial vein in the mid calf.  COGNITION: Overall cognitive status: Within functional limits for tasks assessed   SENSATION: Reports some numbness in back of R calf   COORDINATION: Impaired RAM UEs and LEs, all movements grossly ataxic   EDEMA:    MUSCLE TONE:   MUSCLE LENGTH:    DTRs:    POSTURE:   PALPATION:  Non-tender around R achilles and heel today  LOWER EXTREMITY ROM:     Active  Right Eval Left Eval  Hip flexion    Hip extension    Hip abduction    Hip adduction    Hip internal rotation    Hip external rotation    Knee flexion    Knee extension    Ankle  dorsiflexion 15   Ankle plantarflexion 40   Ankle inversion 30   Ankle eversion 18    (Blank rows = not tested)  LOWER EXTREMITY MMT:    MMT Right Eval Left Eval  Hip flexion 4+ 4+  Hip extension    Hip abduction 4+ seated  4+ seated   Hip adduction    Hip internal rotation    Hip external rotation    Knee flexion 4+ 4+  Knee extension 4+ 4+  Ankle dorsiflexion 4+ 4+  Ankle plantarflexion    Ankle inversion    Ankle eversion    (Blank rows = not tested)  BED MOBILITY:  DNT at eval   TRANSFERS: Assistive device utilized: Environmental consultant - 2 wheeled  Sit to stand: CGA Stand to sit: CGA Chair to chair:  Floor:   RAMP:    CURB:    STAIRS:   GAIT: Gait pattern: step to pattern, decreased arm swing- Right, decreased arm swing- Left, decreased hip/knee flexion- Right, decreased hip/knee flexion- Left, decreased ankle dorsiflexion- Right, decreased ankle dorsiflexion- Left, ataxic, trendelenburg, decreased trunk rotation, trunk flexed, and narrow BOS Distance walked: 36f Assistive device utilized: WEnvironmental consultant- 2 wheeled Level of assistance: Min A Comments: intermittent MinA due to lateral LOB, Mod cues for appropriate distance from RW especially with transfers, pushes RW too far ahead especially on turns   FUNCTIONAL TESTS:  Timed up and go (TUG): 66.7 seconds with RW, MinA from PT   PATIENT SURVEYS:    TODAY'S TREATMENT:  DATE:   11/29- Eval/education only    PATIENT EDUCATION: Education details: exam findings, POC, therapist will need to assess if his personal tripod walker vs 2 wheeled RW is safer next session Person educated: Patient and Caregiver   Education method: Explanation Education comprehension: verbalized understanding  HOME EXERCISE PROGRAM: Will give next session  GOALS: Goals reviewed with patient? Yes  SHORT TERM GOALS:  Target date: 05/27/2022    Will be compliant with appropriate HEP with no more than MinA from caregiver  Baseline: Goal status: INITIAL  2.  Will be able to verbalize at least 3 ways to reduce fall risk at home and in the community  Baseline:  Goal status: INITIAL  3.  Will be able to complete 5x sit to stand in 20 seconds or less with no posterior LOB to show improved functional mobility  Baseline:  Goal status: INITIAL  4.  Will be able to ambulate at least 133f with no rest breaks and LRAD  Baseline:  Goal status: INITIAL   LONG TERM GOALS: Target date: 06/24/2022    Will be able to complete TUG in 40 seconds or less with LRAD and no more than Min guard to show improved balance/mobility  Baseline:  Goal status: INITIAL  2.  Berg goal Baseline:  Goal status: INITIAL  3.  Will be able to navigate single step with LRAD and no more than Min guard for safety  Baseline:  Goal status: INITIAL  4.  Pain in R ankle to be no more than 5/10 at worst  Baseline:  Goal status: INITIAL  5.  Will be able to ambulate at least 2250fwith LRAD, no rest breaks, and Min guard to show improved functional mobility  Baseline:  Goal status: INITIAL    ASSESSMENT:  CLINICAL IMPRESSION: Patient is a [age].o. M who was seen today for physical therapy evaluation and treatment for unsteadiness and spinocerebellar disease. Of note, he does have recent onset of R ankle pain- ROM actually decent and dorsiflexion strength is OK as well, per pain description I wonder if this will be better managed by Podiatrist, will follow from a distance and reassess his ankle/foot if needed down the line. Exam does reveal significant ataxia, gait and mobility impairment, impaired functional activity tolerance, and unsteadiness consistent with spinocerebellar disease. He does demonstrate significant speech impairment and relays frequent coughing when trying to swallow foods/liquids, not interested in formal speech  therapy at this time (did not have a good experience in the past) but we will keep this door open as well. Will benefit from skilled PT services to address functional limitations and reduce pain and fall risk moving forward.   OBJECTIVE IMPAIRMENTS: Abnormal gait, decreased activity tolerance, decreased balance, decreased coordination, decreased knowledge of use of DME, decreased mobility, difficulty walking, decreased strength, and pain.   ACTIVITY LIMITATIONS: standing, stairs, transfers, bed mobility, and locomotion level  PARTICIPATION LIMITATIONS: community activity, yard work, and church  PERSONAL FACTORS: Age, Behavior pattern, Fitness, Past/current experiences, Time since onset of injury/illness/exacerbation, and progressive chronic condition  are also affecting patient's functional outcome.   REHAB POTENTIAL: Good  CLINICAL DECISION MAKING: Evolving/moderate complexity  EVALUATION COMPLEXITY: Moderate  PLAN:  PT FREQUENCY: 2x/week  PT DURATION: 8 weeks  PLANNED INTERVENTIONS: Therapeutic exercises, Therapeutic activity, Neuromuscular re-education, Balance training, Gait training, Patient/Family education, Self Care, Joint mobilization, Stair training, DME instructions, Aquatic Therapy, Wheelchair mobility training, Biofeedback, Ionotophoresis '4mg'$ /ml Dexamethasone, Manual therapy, and Re-evaluation  PLAN FOR NEXT SESSION: BeMerrilee Jansky  Work on balance, functional activity tolerance. Could further assess ankle as well/work on ankle exercises if needed. Need to assess gait with 2 wheeled RW vs his personal tripod walker and see which is safer    Garnie Borchardt U PT DPT PN2  04/29/2022, 3:53 PM

## 2022-05-04 ENCOUNTER — Other Ambulatory Visit: Payer: Medicare Other

## 2022-05-04 ENCOUNTER — Encounter: Payer: Self-pay | Admitting: Physical Therapy

## 2022-05-04 ENCOUNTER — Ambulatory Visit: Payer: Medicare Other | Attending: Nurse Practitioner | Admitting: Physical Therapy

## 2022-05-04 ENCOUNTER — Telehealth: Payer: Self-pay | Admitting: Physical Therapy

## 2022-05-04 DIAGNOSIS — R739 Hyperglycemia, unspecified: Secondary | ICD-10-CM

## 2022-05-04 DIAGNOSIS — R260 Ataxic gait: Secondary | ICD-10-CM | POA: Insufficient documentation

## 2022-05-04 DIAGNOSIS — I10 Essential (primary) hypertension: Secondary | ICD-10-CM

## 2022-05-04 DIAGNOSIS — R262 Difficulty in walking, not elsewhere classified: Secondary | ICD-10-CM | POA: Diagnosis not present

## 2022-05-04 DIAGNOSIS — R2681 Unsteadiness on feet: Secondary | ICD-10-CM | POA: Insufficient documentation

## 2022-05-04 DIAGNOSIS — R2689 Other abnormalities of gait and mobility: Secondary | ICD-10-CM | POA: Diagnosis not present

## 2022-05-04 DIAGNOSIS — E782 Mixed hyperlipidemia: Secondary | ICD-10-CM

## 2022-05-04 DIAGNOSIS — M79671 Pain in right foot: Secondary | ICD-10-CM | POA: Diagnosis not present

## 2022-05-04 NOTE — Patient Instructions (Signed)
Access Code: [email or web address] URL: [email or web address] Date: 05/04/2022 Prepared by: Elease Etienne  Exercises - Standing Tandem Balance with Counter Support  - 1 x daily - 4-5 x weekly - 1 sets - 2 reps - 30 seconds hold - Seated March  - 1 x daily - 4-5 x weekly - 2 sets - 20 reps - Sit to Stand with Counter Support  - 1 x daily - 7 x weekly - 3 sets - 10 reps

## 2022-05-04 NOTE — Telephone Encounter (Signed)
Justin [name] is being treated by physical therapy for spinocerebellar ataxia.  He will benefit from use of 2-wheel rolling walker in order to improve safety with functional mobility.   Please submit order in epic or fax to [facility] neuro rehab.   Thank you, Elease Etienne, PT, DPT

## 2022-05-04 NOTE — Therapy (Signed)
OUTPATIENT PHYSICAL THERAPY NEURO TREATMENT   Patient Name: Justin [name] MRN: [id number] DOB:February 20, 1941, 81 y.o., male Today's Date: 05/04/2022   PCP: Sherrie Mustache  REFERRING PROVIDER: [name], NP  END OF SESSION:  PT End of Session - 05/04/22 1536     Visit Number 2    Number of Visits 17    Date for PT Re-Evaluation 06/24/22    Authorization Type [employer] MCR    Authorization Time Period 04/29/22 to 06/24/22    Progress Note Due on Visit 10    PT Start Time 1530    PT Stop Time 1615    PT Time Calculation (min) 45 min    Equipment Utilized During Treatment Gait belt    Activity Tolerance Patient tolerated treatment well    Behavior During Therapy [facility] for tasks assessed/performed             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   Diaphragmatic hernia without mention of obstruction or gangrene 1982   Hiatal hernia   Diplopia 2006   Disturbance of skin sensation 2003   Diverticulosis of colon (without mention of hemorrhage) 1997   Dizziness and giddiness 2001   Dysphagia, unspecified(787.20) 1999 and   Esophagitis, unspecified 1997   GERD (gastroesophageal reflux disease) 2003   Hypertension 2003   Impotence of organic origin 1999   Lack of coordination September 07, 2007   Lumbago 2003   Migraine with aura, without mention of intractable migraine without mention of status migrainosus 1997   Myalgia and myositis, unspecified 1999   Other and unspecified hyperlipidemia 2003   Other disorders of vitreous 2003   Other malaise and fatigue 2003   Other seborrheic keratosis 2013   Other seborrheic keratosis 04/13/09   Pain in joint, site unspecified  04/13/12   Personal history of fall 04/15/11   Restless legs syndrome (RLS) 1997   Spinocerebellar disease, 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 Donne Hazel   C3-4 anterior cervical surgery  1999   Dr. Hal Neer   CARDIAC CATHETERIZATION  2001   Dr. Glade Lloyd   COLONOSCOPY  07/20/07   Dr. 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   Internal hemorrhoids without complication 58/01/9832   Arthritis 02/21/2014   Dysarthria 02/21/2014   Seborrheic keratosis 02/21/2014   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   Tinnitus 04/12/2013   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[place]) 10/16/2012   Impotence, 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   Essential hypertension    GERD (gastroesophageal reflux disease)  Dyslipidemia    Lumbago    Dysphagia     ONSET DATE: 04/17/2022  REFERRING DIAG: G11.9 (ICD-10-CM) - 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  Briefly recount progression of current functional state.  Pt emphasizes his balance is off, but he can stand if he has something to hold onto.   Pt accompanied by:  caregiver   PERTINENT HISTORY: Assessment/Plan .1. Essential hypertension -Blood pressure well controlled, goal bp <140/90 Continue current medications and dietary modifications follow metabolic panel - COMPLETE METABOLIC PANEL WITH GFR; Future - CBC with Differential/Platelet; Future   2. Mixed hyperlipidemia -low triglyceride diet discussed - Lipid panel; Future - COMPLETE METABOLIC PANEL WITH GFR; Future   3. 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 Hyperglycemia -dietary modifications encouraged - Hemoglobin A1c; Future   5. Imbalance Due to progression of spinocerebellar disease -he has tightness in leg as well. - Ambulatory referral to Physical Therapy to evaluate and treat   6. Right foot pain Frozen water bottle 2-3 times a day Take aleve twice daily for 7 days then stop.  Just tylenol as needed  - Ambulatory referral to Podiatry   7. Leg pain Discussed stretching and will get PT referral at this time No swelling, redness or warmth. Discussed follow up if symptoms worsen.    Return in about 6 months (around 10/16/2022) for routine follow up. Will get fasting labs in 2 weeks.  Carlos American. Dewaine Oats, AGNP  PAIN:  Are you having pain? No   PRECAUTIONS: Fall  WEIGHT BEARING RESTRICTIONS: No  FALLS: Has patient fallen in last 6 months? No; FOF (+), not hesitant to leave the house    OBJECTIVE:  TODAY'S TREATMENT:  DATE:   GAIT: Gait pattern: step through pattern, decreased stride length, decreased hip/knee flexion- Right, decreased  hip/knee flexion- Left, ataxic, and trunk flexed Distance walked: 115' Assistive device utilized: Environmental consultant - 2 wheeled and tripod walker w/ brakes Level of assistance: Min A Comments: Pt ambulates w/ slow pace.  Pt initially ambulates 25' w/ tripod walker and completes lap w/ RW following seated rest.  Demonstrates improved approximation to RW and upright posture w/ cuing to step into walker.  Pt need additional cuing w/ turns.  When using tripod walker pt holds brakes engaged for entirety of ambulation due to fear of falling, when disengaged PT has to maintain approximation to AD to prevent fall on smooth level indoor gym floor surface.  -BERGHulan Fess PT Assessment - 05/04/22 1601       Standardized Balance Assessment   Standardized Balance Assessment Berg Balance Test      Berg Balance Test   Sit to Stand Able to stand  independently using hands    Standing Unsupported Needs several tries to stand 30 seconds unsupported    Sitting with Back Unsupported but Feet Supported on Floor or Stool Able to sit safely and securely 2 minutes    Stand to Sit Controls descent by using hands    Transfers Able to transfer with verbal cueing and /or supervision    Standing Unsupported with Eyes Closed Able to stand 3 seconds    Standing Unsupported with Feet Together Needs help to attain position and unable to hold for 15 seconds    From Standing, Reach Forward with Outstretched Arm Reaches forward but needs supervision    From Standing Position, Pick up Object from Floor Unable to pick up and needs supervision    From Standing Position, Turn to Look Behind Over each Shoulder Needs supervision when turning    Turn 360 Degrees Needs assistance while turning    Standing Unsupported, Alternately Place Feet on Step/Stool Needs assistance to keep from falling or unable to try    Standing Unsupported, One Foot in Front Needs help to step but can hold 15 seconds    Standing on One Leg Unable to try or needs assist  to prevent fall    Total Score 19            Initiated HEP: -tandem at counter -STS w/ counter support x5 -seated march x20 (attempted standing march w/ inc difficulty elevating LE)  PATIENT EDUCATION: Education details: Placing request for DME-RW due to safety, pt in agreement.  Initial HEP w/ guarding from caregiver for safety. Person educated: Patient and Caregiver   Education method: Explanation Education comprehension: verbalized understanding  HOME EXERCISE PROGRAM: Access Code: [email or web address] URL: [email or web address] Date: 05/04/2022 Prepared by: Elease Etienne  Exercises - Standing Tandem Balance with Counter Support  - 1 x daily - 4-5 x weekly - 1 sets - 2 reps - 30 seconds hold - Seated March  - 1 x daily - 4-5 x weekly - 2 sets - 20 reps - Sit to Stand with Counter Support  - 1 x daily - 7 x weekly - 3 sets - 10 reps  GOALS: Goals reviewed with patient? Yes  SHORT TERM GOALS: Target date: 05/27/2022    Will be compliant with appropriate HEP with no more than MinA from caregiver  Baseline: Goal status: INITIAL  2.  Will be able to verbalize at least 3 ways to reduce fall risk at home and in the community  Baseline:  Goal status:  INITIAL  3.  Will be able to complete 5x sit to stand in 20 seconds or less with no posterior LOB to show improved functional mobility  Baseline:  Goal status: INITIAL  4.  Will be able to ambulate at least 113f with no rest breaks and LRAD  Baseline:  Goal status: INITIAL   LONG TERM GOALS: Target date: 06/24/2022    Will be able to complete TUG in 40 seconds or less with LRAD and no more than Min guard to show improved balance/mobility  Baseline:  Goal status: INITIAL  2.  Pt will increase BERG balance score to >/=30/56 to demonstrate improved static balance. Baseline: 19/56 (12/4) Goal status: INITIAL  3.  Will be able to navigate single step with LRAD and no more than Min guard for safety  Baseline:   Goal status: INITIAL  4.  Pain in R ankle to be no more than 5/10 at worst  Baseline:  Goal status: INITIAL  5.  Will be able to ambulate at least 2246fwith LRAD, no rest breaks, and Min guard to show improved functional mobility  Baseline:  Goal status: INITIAL    ASSESSMENT:  CLINICAL IMPRESSION: Assessed BERG this visit with pt scoring 19/56 indicating significant fall risk.  He was safest with ambulation using RW, but would benefit from more practice with AD in clinical setting to improve postural deficits and maintained approximation to AD especially mid-turn.  Initiated HEP with focus on functional strength and deficits noted in BERG with education to caregiver on guarding for safety and use of countertop for stable UE support.  He continues to benefit from skilled PT to improve safety with upright mobility.  OBJECTIVE IMPAIRMENTS: Abnormal gait, decreased activity tolerance, decreased balance, decreased coordination, decreased knowledge of use of DME, decreased mobility, difficulty walking, decreased strength, and pain.   ACTIVITY LIMITATIONS: standing, stairs, transfers, bed mobility, and locomotion level  PARTICIPATION LIMITATIONS: community activity, yard work, and church  PERSONAL FACTORS: Age, Behavior pattern, Fitness, Past/current experiences, Time since onset of injury/illness/exacerbation, and progressive chronic condition  are also affecting patient's functional outcome.   REHAB POTENTIAL: Good  CLINICAL DECISION MAKING: Evolving/moderate complexity  EVALUATION COMPLEXITY: Moderate  PLAN:  PT FREQUENCY: 2x/week  PT DURATION: 8 weeks  PLANNED INTERVENTIONS: Therapeutic exercises, Therapeutic activity, Neuromuscular re-education, Balance training, Gait training, Patient/Family education, Self Care, Joint mobilization, Stair training, DME instructions, Aquatic Therapy, Wheelchair mobility training, Biofeedback, Ionotophoresis '4mg'$ /ml Dexamethasone, Manual therapy,  and Re-evaluation  PLAN FOR NEXT SESSION: Work on balance, functional activity tolerance. Could further assess ankle as well/work on ankle exercises if needed. Gait w/ RW -order back?  If so can print/fax [facility]   MaElease EtiennePT, DPT 05/04/2022, 4:45 PM

## 2022-05-05 ENCOUNTER — Other Ambulatory Visit: Payer: Self-pay | Admitting: Nurse Practitioner

## 2022-05-05 ENCOUNTER — Ambulatory Visit: Payer: Medicare Other | Admitting: Podiatry

## 2022-05-05 DIAGNOSIS — G118 Other hereditary ataxias: Secondary | ICD-10-CM

## 2022-05-05 LAB — COMPLETE METABOLIC PANEL WITH GFR
AG Ratio: 1.8 (calc) (ref 1.0–2.5)
ALT: 25 U/L (ref 9–46)
AST: 17 U/L (ref 10–35)
Albumin: 4.2 g/dL (ref 3.6–5.1)
Alkaline phosphatase (APISO): 117 U/L (ref 35–144)
BUN: 18 mg/dL (ref 7–25)
CO2: 26 mmol/L (ref 20–32)
Calcium: 9.7 mg/dL (ref 8.6–10.3)
Chloride: 102 mmol/L (ref 98–110)
Creat: 1.06 mg/dL (ref 0.70–1.22)
Globulin: 2.4 g/dL (calc) (ref 1.9–3.7)
Glucose, Bld: 113 mg/dL — ABNORMAL HIGH (ref 65–99)
Potassium: 4.3 mmol/L (ref 3.5–5.3)
Sodium: 140 mmol/L (ref 135–146)
Total Bilirubin: 0.6 mg/dL (ref 0.2–1.2)
Total Protein: 6.6 g/dL (ref 6.1–8.1)
eGFR: 71 mL/min/{1.73_m2} (ref >=60)

## 2022-05-05 LAB — CBC WITH DIFFERENTIAL/PLATELET
Absolute Monocytes: 714 cells/uL (ref 200–950)
Basophils Absolute: 50 cells/uL (ref 0–200)
Basophils Relative: 0.6 %
Eosinophils Absolute: 307 cells/uL (ref 15–500)
Eosinophils Relative: 3.7 %
HCT: 43.2 % (ref 38.5–50.0)
Hemoglobin: 14.9 g/dL (ref 13.2–17.1)
Lymphs Abs: 2830 cells/uL (ref 850–3900)
MCH: 31.2 pg (ref 27.0–33.0)
MCHC: 34.5 g/dL (ref 32.0–36.0)
MCV: 90.4 fL (ref 80.0–100.0)
MPV: 11.3 fL (ref 7.5–12.5)
Monocytes Relative: 8.6 %
Neutro Abs: 4399 cells/uL (ref 1500–7800)
Neutrophils Relative %: 53 %
Platelets: 232 10*3/uL (ref 140–400)
RBC: 4.78 10*6/uL (ref 4.20–5.80)
RDW: 13.1 % (ref 11.0–15.0)
Total Lymphocyte: 34.1 %
WBC: 8.3 10*3/uL (ref 3.8–10.8)

## 2022-05-05 LAB — HEMOGLOBIN A1C
Hgb A1c MFr Bld: 6.4 % of total Hgb — ABNORMAL HIGH (ref <5.7)
Mean Plasma Glucose: 137 mg/dL
eAG (mmol/L): 7.6 mmol/L

## 2022-05-05 LAB — LIPID PANEL
Cholesterol: 201 mg/dL — ABNORMAL HIGH (ref <200)
HDL: 42 mg/dL (ref >=40)
LDL Cholesterol (Calc): 107 mg/dL (calc) — ABNORMAL HIGH
Non-HDL Cholesterol (Calc): 159 mg/dL (calc) — ABNORMAL HIGH (ref <130)
Total CHOL/HDL Ratio: 4.8 (calc) (ref <5.0)
Triglycerides: 392 mg/dL — ABNORMAL HIGH (ref <150)

## 2022-05-05 NOTE — Telephone Encounter (Signed)
Order was routed via EPIC to the department listed in [facility] documentation

## 2022-05-05 NOTE — Telephone Encounter (Signed)
Thank you :)

## 2022-05-05 NOTE — Telephone Encounter (Signed)
Order signed and printed can you fox to neuro rehab

## 2022-05-07 ENCOUNTER — Ambulatory Visit: Payer: Medicare Other | Admitting: Podiatrist

## 2022-05-07 ENCOUNTER — Encounter: Payer: Self-pay | Admitting: Podiatrist

## 2022-05-07 ENCOUNTER — Ambulatory Visit (INDEPENDENT_AMBULATORY_CARE_PROVIDER_SITE_OTHER): Payer: Medicare Other

## 2022-05-07 DIAGNOSIS — M79671 Pain in right foot: Secondary | ICD-10-CM

## 2022-05-07 DIAGNOSIS — M7751 Other enthesopathy of right foot: Secondary | ICD-10-CM

## 2022-05-07 NOTE — Progress Notes (Signed)
Chief Complaint  Patient presents with   Foot Pain    Right heel pain     HPI: Patient is 81 y.o. male who presents today with his caregiver for heel pain of the right heel.  He relates it is painful with pressure.  Also hurts when he sleeps.  He has been her comfort between 3 and 4 months.  He has tried stretching, and ice with no relief in symptoms.  He is here for further follow-up and treatment.  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 60 tablet 3   amLODipine (NORVASC) 5 MG tablet TAKE 1 TABLET BY MOUTH EVERY DAY 90 tablet 2   hydrochlorothiazide (HYDRODIURIL) 12.5 MG tablet TAKE 1 TABLET BY MOUTH EVERY DAY 90 tablet 3   losartan (COZAAR) 100 MG tablet TAKE 1 TABLET (100 MG TOTAL) BY MOUTH DAILY 180 tablet 2   oxybutynin (DITROPAN) 5 MG tablet TAKE 1 TABLET BY MOUTH EVERY DAY 90 tablet 1   pantoprazole (PROTONIX) 40 MG tablet TAKE 1 TABLET BY MOUTH EVERY DAY 90 tablet 3   potassium chloride (KLOR-CON) 10 MEQ tablet Take 1 tablet (10  mEq total) by mouth daily. 30 tablet 11   pregabalin (LYRICA) 25 MG capsule TAKE 1 CAPSULE BY MOUTH 3 TIMES A DAY 270 capsule 1   propranolol (INDERAL) 40 MG tablet TAKE 1 TABLET BY MOUTH TWICE A DAY 180 tablet 1   No current facility-administered medications on file prior to visit.    Allergies  Allergen Reactions   Effexor [Venlafaxine Hcl]    Serzone [Nefazodone]    Vivactil [Protriptyline Hcl]     Review of Systems No fevers, chills, nausea, muscle aches, no difficulty breathing, no calf pain, no chest pain or shortness of breath.   Physical Exam  GENERAL APPEARANCE: Alert, conversant. Appropriately groomed. No acute distress.   VASCULAR: Pedal pulses palpable 1/4 DP and  PT bilateral.  Capillary refill time is immediate to all digits,  Proximal to distal cooling is warm to warm.  Digital perfusion adequate.   NEUROLOGIC: sensation is intact to 5.07 monofilament at 5/5 sites bilateral.  Light touch is intact bilateral, vibratory sensation intact bilateral  MUSCULOSKELETAL: acceptable muscle strength, tone and stability bilateral.  Pain on palpation plantar central aspect of the right heel is noted.  Pain at the insertion of the plantar fascia is also seen.  No pain laterally.  No pain with medial to lateral squeeze of the calcaneus is noted.  No pain with calf compression is noted.  DERMATOLOGIC: skin is warm, supple, and  dry.  Color, texture, and turgor of skin within normal limits.  No open wounds are noted.  No preulcerative lesions are seen.  Digital nails are asymptomatic.    X-ray evaluation 3 nonweightbearing views of the right foot are obtained.No infracalcaneal spurring is seen.  No acute osseous abnormalities are noted.  No fractures or dislocations are seen.  Accessory navicular is noted.  Assessment     ICD-10-CM   1. Pain of right heel  M79.671 DG Foot Complete Right    2. Right calcaneal bursitis  M77.51        Plan  Discussed treatment options and  alternatives.  This does appear to be a bursitis of the right heel due to the nature and location of the pain.  Discussed we could try an injection which she would like to proceed.  I did agree I prepped the skin with alcohol.  10 mg of Kenalog with Marcaine plain in the inferior aspect of the right heel for injection #1.  Band-Aid was applied he tolerated this well.  He will continue to ice and stretching exercises will call in the future if the problem fails to resolve.  Otherwise he will be seen back as needed for follow-up.

## 2022-05-07 NOTE — Patient Instructions (Signed)
Plantar Fasciitis (Heel Spur Syndrome) with Rehab The plantar fascia is a fibrous, ligament-like, soft-tissue structure that spans the bottom of the foot. Plantar fasciitis is a condition that causes pain in the foot due to inflammation of the tissue. SYMPTOMS  Pain and tenderness on the underneath side of the foot. Pain that worsens with standing or walking. CAUSES  Plantar fasciitis is caused by irritation and injury to the plantar fascia on the underneath side of the foot. Common mechanisms of injury include: Direct trauma to bottom of the foot. Damage to a small nerve that runs under the foot where the main fascia attaches to the heel bone. Stress placed on the plantar fascia due to any mild increased activity or injury RISK INCREASES WITH:  Obesity. Poor strength and flexibility. Improperly fitted shoes. Tight calf muscles. Flat feet. Failure to warm-up properly before activity.  PREVENTION Warm up and stretch properly before activity. Strength, flexibility Maintain a health body weight. Avoid stress on the plantar fascia. Wear properly fitted shoes, including arch supports for individuals who have flat feet. PROGNOSIS  If treated properly, then the symptoms of plantar fasciitis usually resolve without surgery. However, occasionally surgery is necessary. RELATED COMPLICATIONS  Recurrent symptoms that may result in a chronic condition. Problems of the lower back that are caused by compensating for the injury, such as limping. Pain or weakness of the foot during push-off following surgery. Chronic inflammation, scarring, and partial or complete fascia tear, occurring more often from repeated injections. TREATMENT  Treatment initially involves the use of ice and medication to help reduce pain and inflammation. The use of strengthening and stretching exercises may help reduce pain with activity, especially stretches of the Achilles tendon.  Your caregiver may recommend that you use  arch supports to help reduce stress on the plantar fascia. Often, corticosteroid injections are given to reduce inflammation. If symptoms persist for greater than 6 months despite non-surgical (conservative), then surgery may be recommended.  MEDICATION  If pain medication is necessary, then nonsteroidal anti-inflammatory medications, such as aspirin and ibuprofen, or other minor pain relievers, such as acetaminophen, are often recommended. Corticosteroid injections may be given by your caregiver.  HEAT AND COLD Cold treatment (icing) relieves pain and reduces inflammation. Cold treatment should be applied for 10 to 15 minutes every 2 to 3 hours for inflammation and pain and immediately after any activity that aggravates your symptoms. Use ice packs or massage the area with a piece of ice (ice massage). Heat treatment may be used prior to performing the stretching and strengthening activities prescribed by your caregiver, physical therapist, or athletic trainer. Use a heat pack or soak the injury in warm water. SEEK IMMEDIATE MEDICAL CARE IF: Treatment seems to offer no benefit, or the condition worsens. Any medications produce adverse side effects.  Perform this particular stretch daily first thing in the morning and before you go to bed. Hold for 30 seconds.    Try all the exercises and choose your favorite 3 to perform daily--  EXERCISES-- perform each exercise a total of 10-15 repetitions.  Hold for 30 seconds and perform 3 times per day   RANGE OF MOTION (ROM) AND STRETCHING EXERCISES - Plantar Fasciitis (Heel Spur Syndrome) These exercises may help you when beginning to rehabilitate your injury.   While completing these exercises, remember:  Restoring tissue flexibility helps normal motion to return to the joints. This allows healthier, less painful movement and activity. An effective stretch should be held for at least 30 seconds.   A stretch should never be painful. You should only feel  a gentle lengthening or release in the stretched tissue. RANGE OF MOTION - Toe Extension, Flexion Sit with your right / left leg crossed over your opposite knee. Grasp your toes and gently pull them back toward the top of your foot. You should feel a stretch on the bottom of your toes and/or foot. Hold this stretch for __________ seconds. Now, gently pull your toes toward the bottom of your foot. You should feel a stretch on the top of your toes and or foot. Hold this stretch for __________ seconds. Repeat __________ times. Complete this stretch __________ times per day.  RANGE OF MOTION - Ankle Dorsiflexion, Active Assisted Remove shoes and sit on a chair that is preferably not on a carpeted surface. Place right / left foot under knee. Extend your opposite leg for support. Keeping your heel down, slide your right / left foot back toward the chair until you feel a stretch at your ankle or calf. If you do not feel a stretch, slide your bottom forward to the edge of the chair, while still keeping your heel down. Hold this stretch for __________ seconds. Repeat __________ times. Complete this stretch __________ times per day.  STRETCH  Gastroc, Standing Place hands on wall. Extend right / left leg, keeping the front knee somewhat bent. Slightly point your toes inward on your back foot. Keeping your right / left heel on the floor and your knee straight, shift your weight toward the wall, not allowing your back to arch. You should feel a gentle stretch in the right / left calf. Hold this position for __________ seconds. Repeat __________ times. Complete this stretch __________ times per day. STRETCH  Soleus, Standing Place hands on wall. Extend right / left leg, keeping the other knee somewhat bent. Slightly point your toes inward on your back foot. Keep your right / left heel on the floor, bend your back knee, and slightly shift your weight over the back leg so that you feel a gentle stretch  deep in your back calf. Hold this position for __________ seconds. Repeat __________ times. Complete this stretch __________ times per day. STRETCH  Gastrocsoleus, Standing  Note: This exercise can place a lot of stress on your foot and ankle. Please complete this exercise only if specifically instructed by your caregiver.  Place the ball of your right / left foot on a step, keeping your other foot firmly on the same step. Hold on to the wall or a rail for balance. Slowly lift your other foot, allowing your body weight to press your heel down over the edge of the step. You should feel a stretch in your right / left calf. Hold this position for __________ seconds. Repeat this exercise with a slight bend in your right / left knee. Repeat __________ times. Complete this stretch __________ times per day.

## 2022-05-08 ENCOUNTER — Encounter: Payer: Self-pay | Admitting: Nurse Practitioner

## 2022-05-08 ENCOUNTER — Encounter: Payer: Self-pay | Admitting: Physical Therapy

## 2022-05-08 ENCOUNTER — Ambulatory Visit: Payer: Medicare Other | Admitting: Physical Therapy

## 2022-05-08 DIAGNOSIS — R2681 Unsteadiness on feet: Secondary | ICD-10-CM

## 2022-05-08 DIAGNOSIS — R2689 Other abnormalities of gait and mobility: Secondary | ICD-10-CM

## 2022-05-08 DIAGNOSIS — M79671 Pain in right foot: Secondary | ICD-10-CM | POA: Diagnosis not present

## 2022-05-08 DIAGNOSIS — R262 Difficulty in walking, not elsewhere classified: Secondary | ICD-10-CM | POA: Diagnosis not present

## 2022-05-08 DIAGNOSIS — R260 Ataxic gait: Secondary | ICD-10-CM | POA: Diagnosis not present

## 2022-05-08 NOTE — Patient Instructions (Signed)
-   Standing Hip Extension with Counter Support  - 1 x daily - 7 x weekly - 3 sets - 10 reps - Standing Hip Abduction with Counter Support  - 1 x daily - 7 x weekly - 3 sets - 10 reps

## 2022-05-08 NOTE — Therapy (Signed)
OUTPATIENT PHYSICAL THERAPY NEURO TREATMENT   Patient Name: Justin [name] MRN: [id number] DOB:06-15-1940, 81 y.o., male Today's Date: 05/08/2022   PCP: Sherrie Mustache  REFERRING PROVIDER: [name], NP  END OF SESSION:  PT End of Session - 05/08/22 1243     Visit Number 3    Number of Visits 17    Date for PT Re-Evaluation 06/24/22    Authorization Type BCBS MCR    Authorization Time Period 04/29/22 to 06/24/22    Progress Note Due on Visit 10    PT Start Time 1234    PT Stop Time 1318    PT Time Calculation (min) 44 min    Equipment Utilized During Treatment Gait belt    Activity Tolerance Patient tolerated treatment well    Behavior During Therapy [facility] for tasks assessed/performed             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   Diaphragmatic hernia without mention of obstruction or gangrene 1982   Hiatal hernia   Diplopia 2006   Disturbance of skin sensation 2003   Diverticulosis of colon (without mention of hemorrhage) 1997   Dizziness and giddiness 2001   Dysphagia, unspecified(787.20) 1999 and   Esophagitis, unspecified 1997   GERD (gastroesophageal reflux disease) 2003   Hypertension 2003   Impotence of organic origin 1999   Lack of coordination September 07, 2007   Lumbago 2003   Migraine with aura, without mention of intractable migraine without mention of status migrainosus 1997   Myalgia and myositis, unspecified 1999   Other and unspecified hyperlipidemia 2003   Other disorders of vitreous 2003   Other malaise and fatigue 2003   Other seborrheic keratosis 2013   Other seborrheic keratosis 04/13/09   Pain in joint, site unspecified  04/13/12   Personal history of fall 04/15/11   Restless legs syndrome (RLS) 1997   Spinocerebellar disease, 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 Donne Hazel   C3-4 anterior cervical surgery  1999   Dr. Hal Neer   CARDIAC CATHETERIZATION  2001   Dr. Glade Lloyd   COLONOSCOPY  07/20/07   Dr. 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   Internal hemorrhoids without complication 94/17/4081   Arthritis 02/21/2014   Dysarthria 02/21/2014   Seborrheic keratosis 02/21/2014   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   Tinnitus 04/12/2013   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[place]) 10/16/2012   Impotence, 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   Essential hypertension    GERD (gastroesophageal reflux disease)  Dyslipidemia    Lumbago    Dysphagia     ONSET DATE: 04/17/2022  REFERRING DIAG: G11.9 (ICD-10-CM) - 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  Pt and caregiver inquire about switching to [facility] due to closer proximity.  [place] office working on this, will continue visits here until transferred over successfully.  Pt accompanied by:  caregiver   PERTINENT HISTORY: Assessment/Plan .1. Essential hypertension -Blood pressure well controlled, goal bp <140/90 Continue current medications and dietary modifications follow metabolic panel - COMPLETE METABOLIC PANEL WITH GFR; Future - CBC with Differential/Platelet; Future   2. Mixed hyperlipidemia -low triglyceride diet discussed - Lipid panel; Future - COMPLETE METABOLIC PANEL WITH GFR; Future   3. 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 Hyperglycemia -dietary modifications encouraged - Hemoglobin A1c; Future   5. Imbalance Due to progression of spinocerebellar disease -he has tightness in leg as well. - Ambulatory referral to Physical Therapy to evaluate and treat   6. Right foot pain Frozen water bottle 2-3 times a day Take aleve twice daily for 7 days then stop.  Just tylenol as needed  - Ambulatory referral to Podiatry   7. Leg pain Discussed stretching and will get PT referral at this time No swelling, redness or warmth. Discussed follow up if symptoms worsen.    Return in about 6 months (around 10/16/2022) for routine follow up. Will get fasting labs in 2 weeks.  Carlos American. Dewaine Oats, AGNP  PAIN:  Are you having pain? No - states shot in right heel yesterday really helped  PRECAUTIONS: Fall  WEIGHT BEARING RESTRICTIONS: No  FALLS: Has patient fallen in last 6 months? No; FOF (+), not hesitant to leave the house    OBJECTIVE:  TODAY'S TREATMENT:  DATE:  Provided RW order and [employer]  [facility] contact to caregiver with instructions to call and let PT know if anything else needed to obtain RW.  GAIT: Gait pattern: step through pattern, decreased stride length, decreased hip/knee flexion- Right, decreased hip/knee flexion- Left, ataxic, and trunk flexed Distance walked: 115' Assistive device utilized: Environmental consultant - 2 wheeled Level of assistance: CGA Comments: Pt ambulates w/ slow pace and increased UE tremor this session.  He requires only single standing rest break of approximately 30 seconds to recover.  He reports more fatigue in arms than legs at end.  His approximation to RW is improved this session on straightaways with pt more upright, requires more cuing from therapist with good recall by caregiver for turning to improve safe navigation w/ new AD.  -Standing glut kickbacks 2x10 -Standing hip abduction going from bent knee to extended knee for inc proprioceptive challenge, pt fatigues after first set, ambulates back to mat table w/ RW -SLS at countertop w/ BUE 2x30sec -Reviewed tandem stance x30 sec each side, pt lifts RUE from counter requiring MinA to recover balance  Returned to transport chair at counter.  PATIENT EDUCATION: Education details: Instructions for reaching out to [place] about RW. Person educated: Patient and Caregiver   Education method: Explanation Education comprehension: verbalized understanding  HOME EXERCISE PROGRAM: Access Code: [email or web address] URL: [email or web address] Date: 05/08/2022 Prepared by: Elease Etienne  Exercises - Standing Tandem Balance with Counter Support  - 1 x daily - 4-5 x weekly - 1 sets - 2 reps - 30 seconds hold - Seated March  - 1 x daily - 4-5 x weekly - 2 sets - 20 reps - Sit to Stand with Counter Support  - 1 x daily - 7 x weekly - 3 sets - 10 reps - Standing Hip Extension with Counter Support  - 1 x daily - 7 x weekly - 3 sets - 10 reps - Standing Hip Abduction with Counter Support  - 1 x daily - 7 x  weekly - 3 sets - 10 reps  GOALS: Goals reviewed with patient? Yes  SHORT TERM GOALS: Target date: 05/27/2022    Will be compliant with appropriate HEP with no more than MinA from caregiver  Baseline: Goal status: INITIAL  2.  Will be able to verbalize at least 3 ways to reduce fall risk at home and in the community  Baseline:  Goal status: INITIAL  3.  Will be able to complete 5x sit to stand in 20 seconds or less with no posterior LOB to show improved functional mobility  Baseline:  Goal status: INITIAL  4.  Will be able to ambulate at least 155f with no rest breaks and LRAD  Baseline:  Goal status: INITIAL   LONG TERM GOALS: Target date: 06/24/2022    Will be able to complete TUG in 40 seconds or less with LRAD and no more than Min guard to show improved balance/mobility  Baseline:  Goal status: INITIAL  2.  Pt will increase BERG balance score to >/=30/56 to demonstrate improved static balance. Baseline: 19/56 (12/4) Goal status: INITIAL  3.  Will be able to navigate single step with LRAD and no more than Min guard for safety  Baseline:  Goal status: INITIAL  4.  Pain in R ankle to be no more than 5/10 at worst  Baseline:  Goal status: INITIAL  5.  Will be able to ambulate at least 2262fwith LRAD, no rest breaks, and Min guard to show improved  functional mobility  Baseline:  Goal status: INITIAL    ASSESSMENT:  CLINICAL IMPRESSION: Continued to address hip strength this visit to promote low back support and upright posture.  He does well this session with gait training with RW demonstrating improved approximation.  He continues to benefit from skilled PT to further address coordination in an effort to promote safest transfers and ambulation w/ LRAD for maintained safe independence.  OBJECTIVE IMPAIRMENTS: Abnormal gait, decreased activity tolerance, decreased balance, decreased coordination, decreased knowledge of use of DME, decreased mobility, difficulty  walking, decreased strength, and pain.   ACTIVITY LIMITATIONS: standing, stairs, transfers, bed mobility, and locomotion level  PARTICIPATION LIMITATIONS: community activity, yard work, and church  PERSONAL FACTORS: Age, Behavior pattern, Fitness, Past/current experiences, Time since onset of injury/illness/exacerbation, and progressive chronic condition  are also affecting patient's functional outcome.   REHAB POTENTIAL: Good  CLINICAL DECISION MAKING: Evolving/moderate complexity  EVALUATION COMPLEXITY: Moderate  PLAN:  PT FREQUENCY: 2x/week  PT DURATION: 8 weeks  PLANNED INTERVENTIONS: Therapeutic exercises, Therapeutic activity, Neuromuscular re-education, Balance training, Gait training, Patient/Family education, Self Care, Joint mobilization, Stair training, DME instructions, Aquatic Therapy, Wheelchair mobility training, Biofeedback, Ionotophoresis '4mg'$ /ml Dexamethasone, Manual therapy, and Re-evaluation  PLAN FOR NEXT SESSION: Work on balance, functional activity tolerance. Could further assess ankle as well/work on ankle exercises if needed.  Was pt able to contact [employer]?  Elease Etienne, PT, DPT 05/08/2022, 3:53 PM

## 2022-05-11 ENCOUNTER — Other Ambulatory Visit: Payer: Self-pay | Admitting: Nurse Practitioner

## 2022-05-11 ENCOUNTER — Other Ambulatory Visit: Payer: Self-pay

## 2022-05-11 DIAGNOSIS — E782 Mixed hyperlipidemia: Secondary | ICD-10-CM

## 2022-05-11 MED ORDER — FENOFIBRATE 150 MG PO CAPS: 150.0000 mg | ORAL_CAPSULE | Freq: Every day | ORAL | 3 refills | Status: DC

## 2022-05-12 ENCOUNTER — Ambulatory Visit: Payer: Medicare Other | Admitting: Physical Therapy

## 2022-05-12 DIAGNOSIS — R2689 Other abnormalities of gait and mobility: Secondary | ICD-10-CM | POA: Diagnosis not present

## 2022-05-12 DIAGNOSIS — R2681 Unsteadiness on feet: Secondary | ICD-10-CM

## 2022-05-12 DIAGNOSIS — M79671 Pain in right foot: Secondary | ICD-10-CM | POA: Diagnosis not present

## 2022-05-12 DIAGNOSIS — R262 Difficulty in walking, not elsewhere classified: Secondary | ICD-10-CM | POA: Diagnosis not present

## 2022-05-12 DIAGNOSIS — R260 Ataxic gait: Secondary | ICD-10-CM

## 2022-05-12 NOTE — Telephone Encounter (Signed)
Requesting new dose of medication.  Medication pended and sent to Sherrie Mustache, NP

## 2022-05-12 NOTE — Therapy (Signed)
OUTPATIENT PHYSICAL THERAPY NEURO TREATMENT   Patient Name: Justin [name] MRN: [id number] DOB:[birth year], 81 y.o., male Today's Date: 05/12/2022   PCP: Sherrie Mustache  REFERRING PROVIDER: [name], NP  END OF SESSION:  PT End of Session - 05/12/22 1149     Visit Number 4    Number of Visits 17    Date for PT Re-Evaluation 06/24/22    Authorization Type BCBS MCR    Authorization Time Period 04/29/22 to 06/24/22    Progress Note Due on Visit 10    PT Start Time 1147    PT Stop Time 1230    PT Time Calculation (min) 43 min    Equipment Utilized During Treatment Gait belt    Activity Tolerance Patient tolerated treatment well    Behavior During Therapy [facility] for tasks assessed/performed              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   Diaphragmatic hernia without mention of obstruction or gangrene 1982   Hiatal hernia   Diplopia 2006   Disturbance of skin sensation 2003   Diverticulosis of colon (without mention of hemorrhage) 1997   Dizziness and giddiness 2001   Dysphagia, unspecified(787.20) 1999 and   Esophagitis, unspecified 1997   GERD (gastroesophageal reflux disease) 2003   Hypertension 2003   Impotence of organic origin 1999   Lack of coordination September 07, 2007   Lumbago 2003   Migraine with aura, without mention of intractable migraine without mention of status migrainosus 1997   Myalgia and myositis, unspecified 1999   Other and unspecified hyperlipidemia 2003   Other disorders of vitreous 2003   Other malaise and fatigue 2003   Other seborrheic keratosis 2013   Other seborrheic keratosis 04/13/09   Pain in joint, site  unspecified 04/13/12   Personal history of fall 04/15/11   Restless legs syndrome (RLS) 1997   Spinocerebellar disease, 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 Donne Hazel   C3-4 anterior cervical surgery  1999   Dr. Hal Neer   CARDIAC CATHETERIZATION  2001   Dr. Glade Lloyd   COLONOSCOPY  07/20/07   Dr. 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[place]) 10/16/2012   Impotence, 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   Essential hypertension    GERD (gastroesophageal reflux  disease)    Dyslipidemia    Lumbago    Dysphagia     ONSET DATE: 04/17/2022  REFERRING DIAG: G11.9 (ICD-10-CM) - 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 No pain today. Justin [name] reports that they plan to go by [employer] or call [employer] after this PT session to work on getting a RW.  Pt accompanied by:  caregiver  [name]  PERTINENT HISTORY: Assessment/Plan .1. Essential hypertension -Blood pressure well controlled, goal bp <140/90 Continue current medications and dietary modifications follow metabolic panel - COMPLETE METABOLIC PANEL WITH GFR; Future - CBC with Differential/Platelet; Future   2. Mixed hyperlipidemia -low triglyceride diet discussed - Lipid panel; Future - COMPLETE METABOLIC PANEL WITH GFR; Future   3. 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 Hyperglycemia -dietary modifications encouraged - Hemoglobin A1c; Future   5. Imbalance Due to progression of spinocerebellar disease -he has tightness in leg as well. - Ambulatory referral to Physical Therapy to evaluate and treat   6. Right foot pain Frozen water bottle 2-3 times a day Take aleve twice daily for 7 days then stop.  Just tylenol as needed  - Ambulatory referral to Podiatry   7. Leg pain Discussed stretching and will get PT referral at this time No swelling, redness or warmth. Discussed follow up if symptoms worsen.    Return in about 6 months (around 10/16/2022) for routine follow up. Will get fasting labs in 2 weeks.  Carlos American. Dewaine Oats, AGNP  PAIN:  Are you having pain? No - states shot in right heel yesterday really helped  PRECAUTIONS: Fall  WEIGHT BEARING RESTRICTIONS: No  FALLS: Has patient fallen in last 6 months? No; FOF (+), not hesitant to leave the house    OBJECTIVE:  TODAY'S TREATMENT:  DATE:   GAIT: Gait pattern: decreased  stride length, decreased hip/knee flexion- Right, decreased hip/knee flexion- Left, ataxic, and trunk flexed Distance walked: 58 ft x 2 Assistive device utilized: Walker - 2 wheeled Level of assistance: Min A + w/c follow Comments: initial lap with 4# ankle weights for increased proprioceptive input--results in significantly decreased step length which improves after removal of weights, pt does continue to push the RW out ahead of him and needs cues to step closer to RW during gait. Pt also requires significant amount of time (10-15 min) to cover short distances this session.   NMR:   Stand pivot transfer transport chair to/from mat table with RW and min A for balance.    Standing alt L/R 4" step taps with 4# ankle weights with use of RW and min to mod A for balance. Pt with difficulty controlling step length both forwards and backwards due to ataxia. Regresses task to alt L/R dot taps with min A needed for balance. Pt again with difficulty maintaining balance during this task.     PATIENT EDUCATION: Education details: Instructions for reaching out to [place] about RW, continue HEP Person educated: Patient and Caregiver   Education method: Explanation Education comprehension: verbalized understanding  HOME EXERCISE PROGRAM: Access Code: [email or web address] URL: [email or web address] Date: 05/08/2022 Prepared by: Elease Etienne  Exercises - Standing Tandem Balance with Counter Support  - 1 x daily - 4-5 x weekly - 1 sets - 2 reps - 30 seconds hold - Seated March  - 1 x daily - 4-5 x weekly - 2 sets - 20 reps - Sit to Stand with Counter Support  - 1 x daily - 7 x weekly - 3 sets - 10 reps - Standing Hip Extension with Counter Support  - 1 x daily - 7 x weekly - 3 sets - 10 reps - Standing Hip Abduction with Counter Support  - 1 x daily - 7 x weekly - 3 sets - 10 reps  GOALS: Goals reviewed with patient? Yes  SHORT TERM GOALS: Target date: 05/27/2022    Will be compliant  with appropriate HEP with no more than MinA from caregiver  Baseline: Goal status: INITIAL  2.  Will be able to verbalize at least 3 ways to reduce fall risk at home and in the community  Baseline:  Goal status: INITIAL  3.  Will be able to complete 5x sit to stand in 20 seconds or less with no posterior LOB to show improved functional mobility  Baseline:  Goal status: INITIAL  4.  Will be able to ambulate at least 115f with no rest breaks and LRAD  Baseline:  Goal status: INITIAL   LONG TERM GOALS: Target date: 06/24/2022    Will be able to complete TUG in 40 seconds or less with LRAD and no more than Min guard to show improved balance/mobility  Baseline:  Goal status: INITIAL  2.  Pt will increase BERG balance score to >/=30/56 to demonstrate improved static balance. Baseline: 19/56 (12/4) Goal status: INITIAL  3.  Will be able to navigate single step with LRAD and no more than Min guard for safety  Baseline:  Goal status: INITIAL  4.  Pain in R ankle to be no more than 5/10 at worst  Baseline:  Goal status: INITIAL  5.  Will be able to ambulate at least 2228fwith LRAD, no rest breaks, and Min guard to show improved functional mobility  Baseline:  Goal status: INITIAL  ASSESSMENT:  CLINICAL IMPRESSION: Emphasis of skilled PT session on performing BLE NMR and working on gait training. Pt continues to exhibit ongoing BLE ataxia, utilized 4# ankle weights for improved proprioceptive input and limb control. Pt exhibits increased difficulty during gait to perform adequate step length and stride length with use of 4# weights, exhibits improved gait mechanics after removal of weights. Pt continues to benefit from skilled therapy services to due ongoing ataxia leading to decreased safety and independence with functional mobility. Continue POC.   OBJECTIVE IMPAIRMENTS: Abnormal gait, decreased activity tolerance, decreased balance, decreased coordination, decreased  knowledge of use of DME, decreased mobility, difficulty walking, decreased strength, and pain.   ACTIVITY LIMITATIONS: standing, stairs, transfers, bed mobility, and locomotion level  PARTICIPATION LIMITATIONS: community activity, yard work, and church  PERSONAL FACTORS: Age, Behavior pattern, Fitness, Past/current experiences, Time since onset of injury/illness/exacerbation, and progressive chronic condition  are also affecting patient's functional outcome.   REHAB POTENTIAL: Good  CLINICAL DECISION MAKING: Evolving/moderate complexity  EVALUATION COMPLEXITY: Moderate  PLAN:  PT FREQUENCY: 2x/week  PT DURATION: 8 weeks  PLANNED INTERVENTIONS: Therapeutic exercises, Therapeutic activity, Neuromuscular re-education, Balance training, Gait training, Patient/Family education, Self Care, Joint mobilization, Stair training, DME instructions, Aquatic Therapy, Wheelchair mobility training, Biofeedback, Ionotophoresis '4mg'$ /ml Dexamethasone, Manual therapy, and Re-evaluation  PLAN FOR NEXT SESSION: Work on balance, functional activity tolerance. Could further assess ankle as well/work on ankle exercises if needed.  Was pt able to contact [employer]?  Excell Seltzer, PT, DPT, CSRS 05/12/2022, 12:31 PM

## 2022-05-14 ENCOUNTER — Ambulatory Visit: Payer: Medicare Other

## 2022-05-14 DIAGNOSIS — R2689 Other abnormalities of gait and mobility: Secondary | ICD-10-CM

## 2022-05-14 DIAGNOSIS — R262 Difficulty in walking, not elsewhere classified: Secondary | ICD-10-CM

## 2022-05-14 DIAGNOSIS — R2681 Unsteadiness on feet: Secondary | ICD-10-CM

## 2022-05-14 DIAGNOSIS — R260 Ataxic gait: Secondary | ICD-10-CM

## 2022-05-14 DIAGNOSIS — M79671 Pain in right foot: Secondary | ICD-10-CM | POA: Diagnosis not present

## 2022-05-14 NOTE — Therapy (Signed)
OUTPATIENT PHYSICAL THERAPY NEURO TREATMENT   Patient Name: Justin [name] MRN: [id number] DOB:12-Mar-1941, 81 y.o., male Today's Date: 05/14/2022   PCP: Sherrie Mustache  REFERRING PROVIDER: [name], NP  END OF SESSION:  PT End of Session - 05/14/22 1405     Visit Number 5    Number of Visits 17    Date for PT Re-Evaluation 06/24/22    PT Start Time 5852    PT Stop Time 7782    PT Time Calculation (min) 42 min              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   Diaphragmatic hernia without mention of obstruction or gangrene 1982   Hiatal hernia   Diplopia 2006   Disturbance of skin sensation 2003   Diverticulosis of colon (without mention of hemorrhage) 1997   Dizziness and giddiness 2001   Dysphagia, unspecified(787.20) 1999 and   Esophagitis, unspecified 1997   GERD (gastroesophageal reflux disease) 2003   Hypertension 2003   Impotence of organic origin 1999   Lack of coordination September 07, 2007   Lumbago 2003   Migraine with aura, without mention of intractable migraine without mention of status migrainosus 1997   Myalgia and myositis, unspecified 1999   Other and unspecified hyperlipidemia 2003   Other disorders of vitreous 2003   Other malaise and fatigue 2003   Other seborrheic keratosis 2013   Other seborrheic keratosis 04/13/09   Pain in joint, site unspecified 04/13/12   Personal history of fall 04/15/11   Restless legs syndrome (RLS) 1997   Spinocerebellar disease, 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 Donne Hazel   C3-4 anterior cervical surgery  1999   Dr. Hal Neer   CARDIAC CATHETERIZATION  2001   Dr. Glade Lloyd   COLONOSCOPY  07/20/07   Dr. 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   Internal hemorrhoids without complication 42/35/3614   Arthritis 02/21/2014   Dysarthria 02/21/2014   Seborrheic keratosis 02/21/2014   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   Tinnitus 04/12/2013   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[place]) 10/16/2012   Impotence, 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   Essential hypertension    GERD (gastroesophageal reflux disease)    Dyslipidemia    Lumbago    Dysphagia     ONSET DATE: 04/17/2022  REFERRING DIAG: G11.9 (ICD-10-CM) - 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 Patient caretaker present during session, states she was able to get in touch with [employer] and will see them after to pick up [place]. Patient is compliant with HEP; reports no  falls since last visit.  Pt accompanied by:  caregiver  [name]  PERTINENT HISTORY: Assessment/Plan .1. Essential hypertension -Blood pressure well controlled, goal bp <140/90 Continue current medications and dietary modifications follow metabolic panel - COMPLETE METABOLIC PANEL WITH GFR; Future - CBC with Differential/Platelet; Future   2. Mixed hyperlipidemia -low triglyceride diet discussed - Lipid panel; Future - COMPLETE METABOLIC PANEL WITH GFR; Future   3. 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 Hyperglycemia -dietary modifications encouraged - Hemoglobin A1c; Future   5. Imbalance Due to progression of spinocerebellar disease -he has tightness in leg as well. - Ambulatory referral to Physical Therapy to evaluate and treat   6. Right foot pain Frozen water bottle 2-3 times a day Take aleve twice daily for 7 days then stop.  Just tylenol as needed  - Ambulatory referral to Podiatry   7. Leg pain Discussed stretching and will get PT referral at this time No swelling, redness or warmth. Discussed follow up if symptoms worsen.    Return in about 6 months (around 10/16/2022) for routine follow up. Will get fasting labs in 2 weeks.  Carlos American. Dewaine Oats, AGNP  PAIN:  Are you having pain? No - states shot in right heel yesterday really helped  PRECAUTIONS: Fall  WEIGHT BEARING RESTRICTIONS: No  FALLS: Has patient fallen in last 6 months? No; FOF (+), not hesitant to leave the house    OBJECTIVE:  TODAY'S TREATMENT:     [facility] Adult PT Treatment:                                                DATE: 05/14/22 Therapeutic Activity: stand pivot tranfer WC <->low mat table  Seated: (CGA-SBA): marching --> opposite knee taps --> reciprocal bent arm swing STS with FWW HHAx2->1 (min A-CGA) Ambulation with FWW (min A-CGA) x  STS: no HHA (braced on thighs) + glute squeeze cue in upright  standing (CGA) Standing hip abd YTB (HHAx2 stair railing) x10 B Standing hip ext YTB x8 B                                                                                                       DATE:   GAIT: Gait pattern: decreased stride length, decreased hip/knee flexion- Right, decreased hip/knee flexion- Left, ataxic, and trunk flexed Distance walked: 58 ft x 2 Assistive device utilized: Walker - 2 wheeled Level of assistance: Min A + w/c follow Comments: initial lap with 4# ankle weights for increased proprioceptive input--results in significantly decreased step length which improves after removal of weights, pt does continue to push the RW out ahead of him and needs cues to step closer to RW during gait. Pt also requires significant amount of time (10-15 min) to cover short distances this session.   NMR:   Stand pivot transfer transport chair to/from mat table with RW and min A for balance.    Standing alt L/R 4" step taps with 4# ankle weights with use of RW and min to mod A for balance. Pt with difficulty controlling step length both forwards and backwards due to ataxia. Regresses task to alt L/R dot taps with min A needed for balance. Pt again with difficulty maintaining balance during this task.     PATIENT EDUCATION: Education details: Progress HEP (core/trunk stabilizing) Person educated: Patient and Caregiver   Education method: Explanation Education comprehension: verbalized understanding  HOME EXERCISE PROGRAM: Access Code: [email or web address] URL: [email or web address] Date: 05/08/2022 Prepared by: Elease Etienne  Exercises - Standing Tandem Balance with Counter Support  - 1 x daily - 4-5 x weekly - 1 sets - 2 reps - 30 seconds hold - Seated March  - 1 x daily - 4-5 x weekly - 2 sets - 20 reps - Sit to Stand with Counter Support  - 1 x daily - 7 x weekly - 3 sets - 10 reps - Standing Hip Extension with Counter Support  - 1 x daily - 7 x weekly - 3 sets - 10  reps - Standing Hip Abduction with Counter Support  - 1 x daily - 7 x weekly - 3 sets - 10 reps  GOALS: Goals reviewed with patient? Yes  SHORT TERM GOALS: Target date: 05/27/2022    Will be compliant with appropriate HEP with no more than MinA from caregiver  Baseline: Goal status: INITIAL  2.  Will be able to verbalize at least 3 ways to reduce fall risk at home and in the community  Baseline:  Goal status: INITIAL  3.  Will be able to complete 5x sit to stand in 20 seconds or less with no posterior LOB to show improved functional mobility  Baseline:  Goal status: INITIAL  4.  Will be able to ambulate at least 111f with no rest breaks and LRAD  Baseline:  Goal status: INITIAL   LONG TERM GOALS: Target date: 06/24/2022    Will be able to complete TUG  in 40 seconds or less with LRAD and no more than Min guard to show improved balance/mobility  Baseline:  Goal status: INITIAL  2.  Pt will increase BERG balance score to >/=30/56 to demonstrate improved static balance. Baseline: 19/56 (12/4) Goal status: INITIAL  3.  Will be able to navigate single step with LRAD and no more than Min guard for safety  Baseline:  Goal status: INITIAL  4.  Pain in R ankle to be no more than 5/10 at worst  Baseline:  Goal status: INITIAL  5.  Will be able to ambulate at least 2103f with LRAD, no rest breaks, and Min guard to show improved functional mobility  Baseline:  Goal status: INITIAL    ASSESSMENT:  CLINICAL IMPRESSION: Trunk stabilization progressed with unsupported seated marching progression; initial CGA required due to instability, however patient's postural stability improved over time. Verbal and tactile cues provided to improve glute activation and proprioceptive awareness with alignment in upright standing. Verbal cueing improved anterior weight shifting and eccentric control during sit to stand progression. Patient will continue to benefit from skilled therapy to  address ongoing ataxia and improve safety and independence with functional mobility.    OBJECTIVE IMPAIRMENTS: Abnormal gait, decreased activity tolerance, decreased balance, decreased coordination, decreased knowledge of use of DME, decreased mobility, difficulty walking, decreased strength, and pain.   ACTIVITY LIMITATIONS: standing, stairs, transfers, bed mobility, and locomotion level  PARTICIPATION LIMITATIONS: community activity, yard work, and church  PERSONAL FACTORS: Age, Behavior pattern, Fitness, Past/current experiences, Time since onset of injury/illness/exacerbation, and progressive chronic condition  are also affecting patient's functional outcome.   REHAB POTENTIAL: Good  CLINICAL DECISION MAKING: Evolving/moderate complexity  EVALUATION COMPLEXITY: Moderate  PLAN:  PT FREQUENCY: 2x/week  PT DURATION: 8 weeks  PLANNED INTERVENTIONS: Therapeutic exercises, Therapeutic activity, Neuromuscular re-education, Balance training, Gait training, Patient/Family education, Self Care, Joint mobilization, Stair training, DME instructions, Aquatic Therapy, Wheelchair mobility training, Biofeedback, Ionotophoresis '4mg'$ /ml Dexamethasone, Manual therapy, and Re-evaluation  PLAN FOR NEXT SESSION: Progress trunk stability. Continue progressing balance and functional activity tolerance. Dynamic balance activities. Continue gait training with ankle weights (trial low stair toe taps)  KHelane Gunther PTA 05/14/2022 3:04 PM

## 2022-05-15 ENCOUNTER — Ambulatory Visit: Payer: Medicare Other | Admitting: Physical Therapy

## 2022-05-15 DIAGNOSIS — G118 Other hereditary ataxias: Secondary | ICD-10-CM | POA: Diagnosis not present

## 2022-05-18 ENCOUNTER — Ambulatory Visit: Payer: Medicare Other

## 2022-05-18 DIAGNOSIS — R2689 Other abnormalities of gait and mobility: Secondary | ICD-10-CM | POA: Diagnosis not present

## 2022-05-18 DIAGNOSIS — R2681 Unsteadiness on feet: Secondary | ICD-10-CM | POA: Diagnosis not present

## 2022-05-18 DIAGNOSIS — R260 Ataxic gait: Secondary | ICD-10-CM | POA: Diagnosis not present

## 2022-05-18 DIAGNOSIS — M79671 Pain in right foot: Secondary | ICD-10-CM | POA: Diagnosis not present

## 2022-05-18 DIAGNOSIS — R262 Difficulty in walking, not elsewhere classified: Secondary | ICD-10-CM | POA: Diagnosis not present

## 2022-05-18 NOTE — Therapy (Signed)
OUTPATIENT PHYSICAL THERAPY NEURO TREATMENT   Patient Name: Justin [name] MRN: [id number] DOB:Mar 25, 1941, 81 y.o., male Today's Date: 05/18/2022   PCP: Sherrie Mustache  REFERRING PROVIDER: [name], NP  END OF SESSION:  PT End of Session - 05/18/22 1403     Visit Number 6    Number of Visits 17    Date for PT Re-Evaluation 06/24/22    PT Start Time 0865    PT Stop Time 1452    PT Time Calculation (min) 49 min    Activity Tolerance Patient tolerated treatment well    Behavior During Therapy [facility] for tasks assessed/performed              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   Diaphragmatic hernia without mention of obstruction or gangrene 1982   Hiatal hernia   Diplopia 2006   Disturbance of skin sensation 2003   Diverticulosis of colon (without mention of hemorrhage) 1997   Dizziness and giddiness 2001   Dysphagia, unspecified(787.20) 1999 and   Esophagitis, unspecified 1997   GERD (gastroesophageal reflux disease) 2003   Hypertension 2003   Impotence of organic origin 1999   Lack of coordination September 07, 2007   Lumbago 2003   Migraine with aura, without mention of intractable migraine without mention of status migrainosus 1997   Myalgia and myositis, unspecified 1999   Other and unspecified hyperlipidemia 2003   Other disorders of vitreous 2003   Other malaise and fatigue 2003   Other seborrheic keratosis 2013   Other seborrheic keratosis 04/13/09   Pain in joint, site unspecified 04/13/12   Personal history of fall 04/15/11   Restless legs syndrome (RLS) 1997   Spinocerebellar disease, 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 Donne Hazel   C3-4 anterior cervical surgery  1999   Dr. Hal Neer   CARDIAC CATHETERIZATION  2001   Dr. Glade Lloyd   COLONOSCOPY  07/20/07   Dr. 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[place]) 12/21/2016   Urgency incontinence 04/01/2016   History of fall 08/21/2015   Fungal dermatosis 02/20/2015   SCC (squamous cell carcinoma), 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   Internal hemorrhoids without complication 78/46/9629   Arthritis 02/21/2014   Dysarthria 02/21/2014   Seborrheic keratosis 02/21/2014   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   Tinnitus 04/12/2013   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[place]) 10/16/2012   Impotence, 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   Essential hypertension    GERD (gastroesophageal reflux disease)    Dyslipidemia    Lumbago    Dysphagia     ONSET DATE: 04/17/2022  REFERRING DIAG: G11.9 (ICD-10-CM) - 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 Patient states the toes are bothering him more than his heel,thinks  it might be an ingrown toenail. Patient's caretaker will look into getting an appointment with podiatrist if pain continues. Patient arrived with FWW from [employer].  Pt accompanied by:  caregiver  [name]  PERTINENT HISTORY: Assessment/Plan .1. Essential hypertension -Blood pressure well controlled, goal bp <140/90 Continue current medications and dietary modifications follow metabolic panel - COMPLETE METABOLIC PANEL WITH GFR; Future - CBC with Differential/Platelet; Future   2. Mixed hyperlipidemia -low triglyceride diet discussed - Lipid panel; Future - COMPLETE METABOLIC PANEL WITH GFR; Future   3. 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 Hyperglycemia -dietary modifications encouraged - Hemoglobin A1c; Future   5. Imbalance Due to progression of spinocerebellar disease -he has tightness in leg as well. - Ambulatory referral to Physical Therapy to evaluate and treat   6. Right foot pain Frozen water bottle 2-3 times a day Take aleve twice daily for 7 days then stop.  Just tylenol as needed  - Ambulatory referral to Podiatry   7. Leg pain Discussed stretching and will get PT referral at this time No swelling, redness or warmth. Discussed follow up if symptoms worsen.    Return in about 6 months (around 10/16/2022) for routine follow up. Will get fasting labs in 2 weeks.  Carlos American. Dewaine Oats, AGNP  PAIN:  Are you having pain? No - states shot in right heel yesterday really helped  PRECAUTIONS: Fall  WEIGHT BEARING RESTRICTIONS: No  FALLS: Has patient fallen in last 6 months? No; FOF (+), not hesitant to leave the house    OBJECTIVE:  TODAY'S TREATMENT:     [facility] Adult PT Treatment:                                                DATE: 05/18/22 Therapeutic Activity: Adjusting FWW for proper fit stand step tranfer WC <->low mat table min  A Seated: (SBA, 3#AW): marching --> opposite knee  taps --> reciprocal bent arm swing Ambulation with FWW (CGA, 3#AW) 2x75'; 0# x 20' Side stepping 3#AW at counter (CGA) NuStep L4 (arms 8) x 6 min   [facility] Adult PT Treatment:                                                DATE: 05/14/22 Therapeutic Activity: stand pivot tranfer WC <->low mat table  Seated: (CGA-SBA): marching --> opposite knee taps --> reciprocal bent arm swing STS with FWW HHAx2->1 (min A-CGA) Ambulation with FWW (min A-CGA) x  STS: no HHA (braced on thighs) + glute squeeze cue in upright standing (CGA) Standing hip abd YTB (HHAx2 stair railing) x10 B Standing hip ext YTB x8 B                                                                                                       DATE:   GAIT: Gait pattern: decreased stride length, decreased hip/knee flexion- Right, decreased hip/knee flexion- Left, ataxic, and trunk flexed Distance walked: 58 ft x 2 Assistive device utilized: Walker - 2 wheeled Level of assistance: Min A + w/c follow Comments: initial lap with 4# ankle weights for increased proprioceptive input--results in significantly decreased step length which improves after removal of weights, pt does continue to push the RW out ahead of him and needs cues to step closer to RW during gait. Pt also requires significant amount of time (10-15 min) to cover short distances this session.   NMR:   Stand pivot transfer transport chair to/from mat table with RW and min A for balance.    Standing alt L/R 4" step taps with 4# ankle weights with use of RW and min to mod A for balance. Pt with difficulty controlling step length both forwards and backwards due to ataxia. Regresses task to alt L/R dot taps with min A needed for balance. Pt again with difficulty maintaining balance during this task.     PATIENT EDUCATION: Education details: Foot clearance during walking and safe transfers Person educated: Patient and Caregiver   Education method: Explanation Education  comprehension: verbalized understanding  HOME EXERCISE PROGRAM: Access Code: [email or web address] URL: [email or web address] Date: 05/08/2022 Prepared by: Elease Etienne  Exercises - Standing Tandem Balance with Counter Support  - 1 x daily - 4-5 x weekly - 1 sets - 2 reps - 30 seconds hold - Seated March  - 1 x daily - 4-5 x weekly - 2 sets - 20 reps - Sit to Stand with Counter Support  - 1 x daily - 7 x weekly - 3 sets - 10 reps - Standing Hip Extension with Counter Support  - 1 x daily - 7 x weekly - 3 sets - 10 reps - Standing Hip Abduction with Counter Support  - 1 x daily - 7 x weekly - 3 sets - 10 reps  GOALS: Goals reviewed with patient?  Yes  SHORT TERM GOALS: Target date: 05/27/2022    Will be compliant with appropriate HEP with no more than MinA from caregiver  Baseline: Goal status: INITIAL  2.  Will be able to verbalize at least 3 ways to reduce fall risk at home and in the community  Baseline:  Goal status: INITIAL  3.  Will be able to complete 5x sit to stand in 20 seconds or less with no posterior LOB to show improved functional mobility  Baseline:  Goal status: INITIAL  4.  Will be able to ambulate at least 188f with no rest breaks and LRAD  Baseline:  Goal status: INITIAL   LONG TERM GOALS: Target date: 06/24/2022    Will be able to complete TUG in 40 seconds or less with LRAD and no more than Min guard to show improved balance/mobility  Baseline:  Goal status: INITIAL  2.  Pt will increase BERG balance score to >/=30/56 to demonstrate improved static balance. Baseline: 19/56 (12/4) Goal status: INITIAL  3.  Will be able to navigate single step with LRAD and no more than Min guard for safety  Baseline:  Goal status: INITIAL  4.  Pain in R ankle to be no more than 5/10 at worst  Baseline:  Goal status: INITIAL  5.  Will be able to ambulate at least 2241fwith LRAD, no rest breaks, and Min guard to show improved functional mobility   Baseline:  Goal status: INITIAL    ASSESSMENT:  CLINICAL IMPRESSION: Marching incorporated during ambulation to progress foot clearance and functional strengthening with ankle weights. Patient demonstrated increased forward trunk flexion during ambulation with FWW; verbal cues and visual target improved upright standing posture and over time patient required decreased verbal cueing. NuStep introduced to progress reciprocal gait pattern and functional strengthening.  OBJECTIVE IMPAIRMENTS: Abnormal gait, decreased activity tolerance, decreased balance, decreased coordination, decreased knowledge of use of DME, decreased mobility, difficulty walking, decreased strength, and pain.   ACTIVITY LIMITATIONS: standing, stairs, transfers, bed mobility, and locomotion level  PARTICIPATION LIMITATIONS: community activity, yard work, and church  PERSONAL FACTORS: Age, Behavior pattern, Fitness, Past/current experiences, Time since onset of injury/illness/exacerbation, and progressive chronic condition  are also affecting patient's functional outcome.   REHAB POTENTIAL: Good  CLINICAL DECISION MAKING: Evolving/moderate complexity  EVALUATION COMPLEXITY: Moderate  PLAN:  PT FREQUENCY: 2x/week  PT DURATION: 8 weeks  PLANNED INTERVENTIONS: Therapeutic exercises, Therapeutic activity, Neuromuscular re-education, Balance training, Gait training, Patient/Family education, Self Care, Joint mobilization, Stair training, DME instructions, Aquatic Therapy, Wheelchair mobility training, Biofeedback, Ionotophoresis '4mg'$ /ml Dexamethasone, Manual therapy, and Re-evaluation  PLAN FOR NEXT SESSION: Warm up on NuStep. Continue gait training with FWW. Progress trunk stability. Continue progressing balance and functional activity tolerance. Dynamic balance activities. Trial low stair toe taps w/ankle weights.  KaHelane GuntherPTA 05/18/2022 3:08 PM

## 2022-05-19 ENCOUNTER — Ambulatory Visit: Payer: Medicare Other | Admitting: Physical Therapy

## 2022-05-21 ENCOUNTER — Ambulatory Visit: Payer: Medicare Other | Admitting: Physical Therapy

## 2022-05-21 ENCOUNTER — Ambulatory Visit: Payer: Medicare Other

## 2022-05-21 DIAGNOSIS — M79671 Pain in right foot: Secondary | ICD-10-CM | POA: Diagnosis not present

## 2022-05-21 DIAGNOSIS — R2689 Other abnormalities of gait and mobility: Secondary | ICD-10-CM | POA: Diagnosis not present

## 2022-05-21 DIAGNOSIS — R2681 Unsteadiness on feet: Secondary | ICD-10-CM | POA: Diagnosis not present

## 2022-05-21 DIAGNOSIS — R262 Difficulty in walking, not elsewhere classified: Secondary | ICD-10-CM | POA: Diagnosis not present

## 2022-05-21 DIAGNOSIS — R260 Ataxic gait: Secondary | ICD-10-CM | POA: Diagnosis not present

## 2022-05-21 NOTE — Therapy (Signed)
OUTPATIENT PHYSICAL THERAPY NEURO TREATMENT   Patient Name: Justin [name] MRN: [id number] DOB:1940/11/02, 81 y.o., male Today's Date: 05/21/2022   PCP: Sherrie Mustache  REFERRING PROVIDER: [name], NP  END OF SESSION:  PT End of Session - 05/21/22 1403     Visit Number 7    Number of Visits 17    Date for PT Re-Evaluation 06/24/22    PT Start Time 0737    PT Stop Time 1062    PT Time Calculation (min) 42 min    Activity Tolerance Patient tolerated treatment well    Behavior During Therapy [facility] for tasks assessed/performed              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   Diaphragmatic hernia without mention of obstruction or gangrene 1982   Hiatal hernia   Diplopia 2006   Disturbance of skin sensation 2003   Diverticulosis of colon (without mention of hemorrhage) 1997   Dizziness and giddiness 2001   Dysphagia, unspecified(787.20) 1999 and   Esophagitis, unspecified 1997   GERD (gastroesophageal reflux disease) 2003   Hypertension 2003   Impotence of organic origin 1999   Lack of coordination September 07, 2007   Lumbago 2003   Migraine with aura, without mention of intractable migraine without mention of status migrainosus 1997   Myalgia and myositis, unspecified 1999   Other and unspecified hyperlipidemia 2003   Other disorders of vitreous 2003   Other malaise and fatigue 2003   Other seborrheic keratosis 2013   Other seborrheic keratosis 04/13/09   Pain in joint, site unspecified 04/13/12   Personal history of fall 04/15/11   Restless legs syndrome (RLS) 1997   Spinocerebellar disease, 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 Donne Hazel   C3-4 anterior cervical surgery  1999   Dr. Hal Neer   CARDIAC CATHETERIZATION  2001   Dr. Glade Lloyd   COLONOSCOPY  07/20/07   Dr. 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   Internal hemorrhoids without complication 69/48/5462   Arthritis 02/21/2014   Dysarthria 02/21/2014   Seborrheic keratosis 02/21/2014   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   Tinnitus 04/12/2013   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[place]) 10/16/2012   Impotence, 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   Essential hypertension    GERD (gastroesophageal reflux disease)    Dyslipidemia    Lumbago    Dysphagia     ONSET DATE: 04/17/2022  REFERRING DIAG: G11.9 (ICD-10-CM) - 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 Patient reports no  falls since last visit. 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 Will get fasting labs in 2 weeks.  Carlos American. Dewaine Oats, AGNP  PAIN:  Are you having pain? No - states shot in right heel yesterday really helped  PRECAUTIONS: Fall  WEIGHT BEARING RESTRICTIONS: No  FALLS: Has patient fallen in last 6 months? No; FOF (+), not hesitant to leave the house    OBJECTIVE:  TODAY'S TREATMENT:     [facility] Adult PT Treatment:                                                DATE: 05/21/2022 Therapeutic Activity: CGA transfer to various surfaces STS w/glute squeeze & alt arm lift w/FWW CGA--SBA Seated marching 3#AW --> added reciprocal arm swing 3#DB Standing, HHAx2 stair rail, 3#AW: colored dot foot tap --> added lateral toe taps + opposite arm raises Amb 3' FWW focus on safe postioning with FWW Seated ball toss/catch  [facility] Adult PT Treatment:                                                DATE: 05/18/22 Therapeutic Activity: Adjusting FWW for proper fit stand step tranfer WC <->low mat table min A Seated: (SBA, 3#AW): marching --> opposite knee taps --> reciprocal bent arm swing Ambulation with FWW (CGA, 3#AW) 2x75'; 0# x 20' Side stepping 3#AW at counter (CGA) NuStep L4 (arms 8) x 6 min    PATIENT EDUCATION: Education details: Theraband tied to [place] for visual aid to improve safe maneuvering during amb/transfers Person educated: Patient  and Caregiver   Education method: Explanation Education comprehension: verbalized understanding  HOME EXERCISE PROGRAM: Access Code: [email or web address] URL: [email or web address] Date: 05/08/2022 Prepared by: Elease Etienne  Exercises - Standing Tandem Balance with Counter Support  - 1 x daily - 4-5 x weekly - 1 sets  - 2 reps - 30 seconds hold - Seated March  - 1 x daily - 4-5 x weekly - 2 sets - 20 reps - Sit to Stand with Counter Support  - 1 x daily - 7 x weekly - 3 sets - 10 reps - Standing Hip Extension with Counter Support  - 1 x daily - 7 x weekly - 3 sets - 10 reps - Standing Hip Abduction with Counter Support  - 1 x daily - 7 x weekly - 3 sets - 10 reps  GOALS: Goals reviewed with patient? Yes  SHORT TERM GOALS: Target date: 05/27/2022    Will be compliant with appropriate HEP with no more than MinA from caregiver  Baseline: Goal status: INITIAL  2.  Will be able to verbalize at least 3 ways to reduce fall risk at home and in the community  Baseline:  Goal status: INITIAL  3.  Will be able to complete 5x sit to stand in 20 seconds or less with no posterior LOB to show improved functional mobility  Baseline:  Goal status: INITIAL  4.  Will be able to ambulate at least 112f with no rest breaks and LRAD  Baseline:  Goal status: INITIAL   LONG TERM GOALS: Target date: 06/24/2022    Will be able to complete TUG in 40 seconds or less with LRAD and no more than Min guard to show improved balance/mobility  Baseline:  Goal status: INITIAL  2.  Pt will increase BERG balance score to >/=30/56 to demonstrate improved static balance. Baseline: 19/56 (12/4) Goal status: INITIAL  3.  Will be able to navigate single step with LRAD and no more than Min guard for safety  Baseline:  Goal status: INITIAL  4.  Pain in R ankle to be no more than 5/10 at worst  Baseline:  Goal status: INITIAL  5.  Will be able to ambulate at least 2214fwith LRAD, no rest breaks, and Min guard to show improved functional mobility  Baseline:  Goal status: INITIAL    ASSESSMENT:  CLINICAL IMPRESSION: Reciprocal gait patterning performed with hand and ankle weights to progress proprioceptive feedback and awareness. Theraband tied to [place] visual aid to improve safe maneuvering during amb/transfers;  patient continues to demonstrate increased forward flexed hip posture. Core and trunk stability challenged with unsupported ball toss/catch with therapist; patient demonstrated fair reactive strategies which improved slightly over time.  OBJECTIVE IMPAIRMENTS: Abnormal gait, decreased activity tolerance, decreased balance, decreased coordination, decreased knowledge of use of DME, decreased mobility, difficulty walking, decreased strength, and pain.   ACTIVITY LIMITATIONS: standing, stairs, transfers, bed mobility, and locomotion level  PARTICIPATION LIMITATIONS: community activity, yard work, and church  PERSONAL FACTORS: Age, Behavior pattern, Fitness, Past/current experiences, Time since onset of injury/illness/exacerbation, and progressive chronic condition  are also affecting patient's functional outcome.   REHAB POTENTIAL: Good  CLINICAL DECISION MAKING: Evolving/moderate complexity  EVALUATION COMPLEXITY: Moderate  PLAN:  PT FREQUENCY: 2x/week  PT DURATION: 8 weeks  PLANNED INTERVENTIONS: Therapeutic exercises, Therapeutic activity, Neuromuscular re-education, Balance training, Gait training, Patient/Family education, Self Care, Joint mobilization, Stair training, DME instructions, Aquatic Therapy, Wheelchair mobility training, Biofeedback, Ionotophoresis '4mg'$ /ml Dexamethasone, Manual therapy, and Re-evaluation  PLAN FOR NEXT  SESSION: Update Short Term Goals. Warm up on NuStep. Continue gait training with FWW. Progress trunk stability. Continue progressing balance and functional activity tolerance. Dynamic balance activities. Trial low stair toe taps w/ankle weights.  Helane Gunther, PTA 05/21/2022 2:50 PM

## 2022-05-26 ENCOUNTER — Encounter: Payer: Self-pay | Admitting: Physical Therapy

## 2022-05-26 ENCOUNTER — Ambulatory Visit: Payer: Medicare Other | Admitting: Physical Therapy

## 2022-05-26 DIAGNOSIS — R262 Difficulty in walking, not elsewhere classified: Secondary | ICD-10-CM

## 2022-05-26 DIAGNOSIS — R2681 Unsteadiness on feet: Secondary | ICD-10-CM

## 2022-05-26 DIAGNOSIS — R260 Ataxic gait: Secondary | ICD-10-CM | POA: Diagnosis not present

## 2022-05-26 DIAGNOSIS — R2689 Other abnormalities of gait and mobility: Secondary | ICD-10-CM | POA: Diagnosis not present

## 2022-05-26 DIAGNOSIS — M79671 Pain in right foot: Secondary | ICD-10-CM

## 2022-05-26 NOTE — Therapy (Signed)
OUTPATIENT PHYSICAL THERAPY NEURO TREATMENT   Patient Name: Justin [name] MRN: [id number] DOB:[birth year], 81 y.o., male Today's Date: 05/26/2022   PCP: Sherrie Mustache  REFERRING PROVIDER: [name], NP  END OF SESSION:  PT End of Session - 05/26/22 1359     Visit Number 8    Number of Visits 17    Date for PT Re-Evaluation 06/24/22    Authorization Type [employer] MCR    Authorization Time Period 04/29/22 to 06/24/22    Progress Note Due on Visit 10    PT Start Time 1400    PT Stop Time 1445    PT Time Calculation (min) 45 min    Activity Tolerance Patient tolerated treatment well    Behavior During Therapy [facility] for tasks assessed/performed              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   Diaphragmatic hernia without mention of obstruction or gangrene 1982   Hiatal hernia   Diplopia 2006   Disturbance of skin sensation 2003   Diverticulosis of colon (without mention of hemorrhage) 1997   Dizziness and giddiness 2001   Dysphagia, unspecified(787.20) 1999 and   Esophagitis, unspecified 1997   GERD (gastroesophageal reflux disease) 2003   Hypertension 2003   Impotence of organic origin 1999   Lack of coordination September 07, 2007   Lumbago 2003   Migraine with aura, without mention of intractable migraine without mention of status migrainosus 1997   Myalgia and myositis, unspecified 1999   Other and unspecified hyperlipidemia 2003   Other disorders of vitreous 2003   Other malaise and fatigue 2003   Other seborrheic keratosis 2013   Other seborrheic keratosis 04/13/09   Pain in joint, site unspecified 04/13/12   Personal history of fall 04/15/11    Restless legs syndrome (RLS) 1997   Spinocerebellar disease, 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 Donne Hazel   C3-4 anterior cervical surgery  1999   Dr. Hal Neer   CARDIAC CATHETERIZATION  2001   Dr. Glade Lloyd   COLONOSCOPY  07/20/07   Dr. 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   Internal hemorrhoids without complication 17/49/4496   Arthritis 02/21/2014   Dysarthria 02/21/2014   Seborrheic keratosis 02/21/2014   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   Tinnitus 04/12/2013   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[place]) 10/16/2012   Impotence, 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   Essential hypertension    GERD (gastroesophageal reflux disease)    Dyslipidemia    Lumbago  Dysphagia     ONSET DATE: 04/17/2022  REFERRING DIAG: G11.9 (ICD-10-CM) - 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[place]) R26.89 (ICD-10-CM) - Imbalance  THERAPY DIAG:  Difficulty in walking, not elsewhere classified  Other abnormalities of gait and mobility  Ataxic gait  Unsteadiness on feet  Pain of right heel  Rationale for Evaluation and Treatment: Rehabilitation  SUBJECTIVE:                                                                                                                                                                                             SUBJECTIVE STATEMENT: No falls. Pt  reports nothing new or different.  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 Will get fasting labs in 2 weeks.  Carlos American. Dewaine Oats, AGNP  PAIN:  Are you having pain? No - states shot in right heel yesterday really helped  PRECAUTIONS: Fall  WEIGHT BEARING RESTRICTIONS: No  FALLS: Has patient fallen in last 6 months? No; FOF (+), not hesitant to leave the house    OBJECTIVE:  Functional test: 5x STS 05/26/22: 1 min 14 sec  TODAY'S TREATMENT:    [facility] Adult PT Treatment:                                                DATE: 05/26/22 Therapeutic Exercise: Seated marching 4# 2x10 Seated LAQ 4# 2x10 Seated pball flexion x10 Seated heel/toe raises 2x10 Therapeutic Activities: Seated hip hinge x10 Standing alternating UE off RW x10 Sit<>stand 2x5 Standing static balance feet apart 2x20 sec Gait: Amb 2x75' RW CGA; cues for heel strike and larger step length   [facility] Adult PT Treatment:                                                DATE: 05/21/2022 Therapeutic Activity: CGA transfer to various surfaces STS w/glute squeeze & alt arm lift w/FWW CGA--SBA Seated marching 3#AW --> added reciprocal arm swing 3#DB Standing, HHAx2 stair rail, 3#AW: colored dot foot tap --> added lateral toe taps + opposite arm raises Amb 17' FWW focus on safe postioning with FWW Seated ball toss/catch     PATIENT EDUCATION: Education details: Theraband  tied to [place] for visual aid to improve safe maneuvering during amb/transfers Person educated: Patient and Caregiver   Education method: Explanation Education comprehension: verbalized understanding  HOME EXERCISE PROGRAM: Access Code: [email or web address] URL: [email or web address] Date: 05/08/2022 Prepared by: Elease Etienne  Exercises - Standing Tandem Balance with Counter Support  - 1 x daily - 4-5 x weekly - 1 sets - 2 reps - 30 seconds hold - Seated March  - 1 x daily - 4-5 x weekly - 2 sets - 20 reps - Sit to Stand with Counter Support  - 1 x daily - 7 x weekly - 3 sets - 10 reps - Standing Hip Extension with Counter Support  - 1 x daily - 7 x weekly - 3 sets - 10 reps - Standing Hip Abduction with Counter Support  - 1 x daily - 7 x weekly - 3 sets - 10 reps  GOALS: Goals reviewed with patient? Yes  SHORT TERM GOALS: Target date: 05/27/2022    Will be compliant with appropriate HEP with no more than MinA from caregiver  Baseline: Goal status: MET  2.  Will be able to verbalize at least 3 ways to reduce fall risk at home and in the community  Baseline:  Goal status: MET  3.  Will be able to complete 5x sit to stand in 20 seconds or less with no posterior LOB to show improved functional mobility  Baseline: 1 min 15 sec 05/26/22 Goal status: IN PROGRESS  4.  Will be able to ambulate at least 144f with no rest breaks and LRAD  Baseline: 2x75' with one seated rest break using RW 05/26/22 Goal status: IN PROGRESS   LONG TERM GOALS: Target date: 06/24/2022    Will be able to complete TUG in 40 seconds or less with LRAD and no more than Min guard to show improved balance/mobility  Baseline:  Goal status: INITIAL  2.  Pt will increase BERG balance score to >/=30/56 to demonstrate improved static balance. Baseline: 19/56 (12/4) Goal status: INITIAL  3.  Will be able to navigate single step with LRAD and no more than Min guard for safety  Baseline:  Goal status: INITIAL  4.  Pain in R ankle to be no more than 5/10 at worst  Baseline:  Goal status: INITIAL  5.  Will be able to ambulate at least 2227fwith LRAD, no rest breaks, and Min guard to show improved functional mobility  Baseline:  Goal status: INITIAL    ASSESSMENT:  CLINICAL IMPRESSION: Continued to work on  improving walking endurance, balance and strength. Re-checked pt's STGs. He has partially met his sit<>stand and ambulation goals. Pt with increasing step length given v/cs. Trialed heel/toe raises in standing; however, pt not yet strong enough to perform. Able to perform in seated.   OBJECTIVE IMPAIRMENTS: Abnormal gait, decreased activity tolerance, decreased balance, decreased coordination, decreased knowledge of use of DME, decreased mobility, difficulty walking, decreased strength, and pain.   ACTIVITY LIMITATIONS: standing, stairs, transfers, bed mobility, and locomotion level  PARTICIPATION LIMITATIONS: community activity, yard work, and church  PERSONAL FACTORS: Age, Behavior pattern, Fitness, Past/current experiences, Time since onset of injury/illness/exacerbation, and progressive chronic condition  are also affecting patient's functional outcome.   REHAB POTENTIAL: Good  CLINICAL DECISION MAKING: Evolving/moderate complexity  EVALUATION COMPLEXITY: Moderate  PLAN:  PT FREQUENCY: 2x/week  PT DURATION: 8 weeks  PLANNED INTERVENTIONS: Therapeutic exercises, Therapeutic activity, Neuromuscular re-education, Balance training, Gait training, Patient/Family education, Self Care, Joint mobilization, Stair training,  DME instructions, Aquatic Therapy, Wheelchair mobility training, Biofeedback, Ionotophoresis 67m/ml Dexamethasone, Manual therapy, and Re-evaluation  PLAN FOR NEXT SESSION: Update Short Term Goals. Warm up on NuStep. Continue gait training with FWW. Progress trunk stability. Continue progressing balance and functional activity tolerance. Dynamic balance activities. Trial low stair toe taps w/ankle weights.  Noelia Lenart April Ma L Oleva Koo, PT, DPT 05/26/2022 1:59 PM

## 2022-05-28 ENCOUNTER — Ambulatory Visit: Payer: Medicare Other

## 2022-05-28 DIAGNOSIS — R262 Difficulty in walking, not elsewhere classified: Secondary | ICD-10-CM | POA: Diagnosis not present

## 2022-05-28 DIAGNOSIS — M79671 Pain in right foot: Secondary | ICD-10-CM

## 2022-05-28 DIAGNOSIS — R2689 Other abnormalities of gait and mobility: Secondary | ICD-10-CM | POA: Diagnosis not present

## 2022-05-28 DIAGNOSIS — R260 Ataxic gait: Secondary | ICD-10-CM

## 2022-05-28 DIAGNOSIS — R2681 Unsteadiness on feet: Secondary | ICD-10-CM

## 2022-05-28 NOTE — Therapy (Signed)
OUTPATIENT PHYSICAL THERAPY NEURO TREATMENT   Patient Name: Justin [name] MRN: [id number] DOB:1941/05/13, 81 y.o., male Today's Date: 05/28/2022   PCP: Sherrie Mustache  REFERRING PROVIDER: [name], NP  END OF SESSION:  PT End of Session - 05/28/22 1403     Visit Number 9    Number of Visits 17    Date for PT Re-Evaluation 06/24/22    PT Start Time 7564    PT Stop Time 3329    PT Time Calculation (min) 45 min    Activity Tolerance Patient tolerated treatment well    Behavior During Therapy [facility] for tasks assessed/performed              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   Diaphragmatic hernia without mention of obstruction or gangrene 1982   Hiatal hernia   Diplopia 2006   Disturbance of skin sensation 2003   Diverticulosis of colon (without mention of hemorrhage) 1997   Dizziness and giddiness 2001   Dysphagia, unspecified(787.20) 1999 and   Esophagitis, unspecified 1997   GERD (gastroesophageal reflux disease) 2003   Hypertension 2003   Impotence of organic origin 1999   Lack of coordination September 07, 2007   Lumbago 2003   Migraine with aura, without mention of intractable migraine without mention of status migrainosus 1997   Myalgia and myositis, unspecified 1999   Other and unspecified hyperlipidemia 2003   Other disorders of vitreous 2003   Other malaise and fatigue 2003   Other seborrheic keratosis 2013   Other seborrheic keratosis 04/13/09   Pain in joint, site unspecified 04/13/12   Personal history of fall 04/15/11   Restless legs syndrome (RLS) 1997   Spinocerebellar disease, 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 Donne Hazel   C3-4 anterior cervical surgery  1999   Dr. Hal Neer   CARDIAC CATHETERIZATION  2001   Dr. Glade Lloyd   COLONOSCOPY  07/20/07   Dr. 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   Internal hemorrhoids without complication 51/88/4166   Arthritis 02/21/2014   Dysarthria 02/21/2014   Seborrheic keratosis 02/21/2014   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   Tinnitus 04/12/2013   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[place]) 10/16/2012   Impotence, 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   Essential hypertension    GERD (gastroesophageal reflux disease)    Dyslipidemia    Lumbago    Dysphagia     ONSET DATE: 04/17/2022  REFERRING DIAG: G11.9 (ICD-10-CM) - 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[place]) R26.89 (ICD-10-CM) - Imbalance  THERAPY DIAG:  Difficulty in walking, not elsewhere classified  Other abnormalities of gait and mobility  Ataxic gait  Unsteadiness on feet  Pain of right heel  Rationale for Evaluation and Treatment: Rehabilitation  SUBJECTIVE:                                                                                                                                                                                             SUBJECTIVE STATEMENT: Patient reports it is easier navigating FWW with the tennis balls added to back legs. Patient reports no falls since last visit.    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 No - states shot in right heel yesterday really helped  PRECAUTIONS: Fall  WEIGHT BEARING RESTRICTIONS: No  FALLS: Has patient fallen in last 6 months? No; FOF (+), not hesitant to leave the house    OBJECTIVE:  Functional test: 5x STS 05/26/22: 1 min 14 sec  TODAY'S TREATMENT:     [facility] Adult PT Treatment:                                                DATE: 05/28/2022 Therapeutic Activity: NuStep warm up L6 x 8 min Amb FWW + 4#AW x75' Low mat table (wedge + pillows): LTR x10 Bridges + ball squeeze x10 Side scooting (focus on hip hinge) EOB Standing balance + reach across cone hand off -->added cone color call out (HHA prn, CGA)   [facility] Adult PT Treatment:                                                DATE: 05/26/22 Therapeutic Exercise: Seated marching 4# 2x10 Seated LAQ 4# 2x10 Seated pball flexion x10 Seated heel/toe raises 2x10 Therapeutic Activities: Seated hip hinge x10 Standing alternating UE off RW x10 Sit<>stand 2x5 Standing static balance feet apart 2x20 sec Gait: Amb 2x75' RW CGA; cues for heel strike and larger step length   PATIENT EDUCATION: Education details: Theraband tied to [place] for visual aid to improve safe maneuvering during amb/transfers Person educated: Patient and Caregiver   Education method: Explanation Education comprehension: verbalized understanding  HOME EXERCISE PROGRAM: Access Code: [email or web address] URL: [email or web address] Date: 05/08/2022 Prepared  by: Elease Etienne  Exercises - Standing Tandem Balance with Counter Support  - 1 x daily - 4-5 x weekly - 1 sets - 2 reps - 30 seconds hold - Seated March  - 1 x daily - 4-5 x weekly - 2 sets - 20 reps - Sit to Stand with Counter Support  - 1 x daily - 7 x  weekly - 3 sets - 10 reps - Standing Hip Extension with Counter Support  - 1 x daily - 7 x weekly - 3 sets - 10 reps - Standing Hip Abduction with Counter Support  - 1 x daily - 7 x weekly - 3 sets - 10 reps  GOALS: Goals reviewed with patient? Yes  SHORT TERM GOALS: Target date: 05/27/2022    Will be compliant with appropriate HEP with no more than MinA from caregiver  Baseline: Goal status: MET   2.  Will be able to verbalize at least 3 ways to reduce fall risk at home and in the community  Baseline:  Goal status: MET   3.  Will be able to complete 5x sit to stand in 20 seconds or less with no posterior LOB to show improved functional mobility  Baseline: 1 min 15 sec 05/26/22 Goal status: IN PROGRESS 12/28:IN PROGRESS  4.  Will be able to ambulate at least 127f with no rest breaks and LRAD  Baseline: 2x75' with one seated rest break using RW 05/26/22 Goal status: IN PROGRESS 12/28: PARTIALLY MET   LONG TERM GOALS: Target date: 06/24/2022    Will be able to complete TUG in 40 seconds or less with LRAD and no more than Min guard to show improved balance/mobility  Baseline:  Goal status: INITIAL  2.  Pt will increase BERG balance score to >/=30/56 to demonstrate improved static balance. Baseline: 19/56 (12/4) Goal status: INITIAL  3.  Will be able to navigate single step with LRAD and no more than Min guard for safety  Baseline:  Goal status: INITIAL  4.  Pain in R ankle to be no more than 5/10 at worst  Baseline:  Goal status: INITIAL  5.  Will be able to ambulate at least 2222fwith LRAD, no rest breaks, and Min guard to show improved functional mobility  Baseline:  Goal status: INITIAL    ASSESSMENT:  CLINICAL IMPRESSION: Bed mobility performed with instruction in sit<->supine transfers to decrease cervical/abdominal strain. Hip hinging mechanics continued with side scooting on edge of low mat table; patient demonstrated improved anterior weight shifting  during sit to stand transitions. Dynamic balance challenged with reaching activities across midline; patient demonstrated moderate postural sway with no hand hold support (CGA provided by therapist).  OBJECTIVE IMPAIRMENTS: Abnormal gait, decreased activity tolerance, decreased balance, decreased coordination, decreased knowledge of use of DME, decreased mobility, difficulty walking, decreased strength, and pain.   ACTIVITY LIMITATIONS: standing, stairs, transfers, bed mobility, and locomotion level  PARTICIPATION LIMITATIONS: community activity, yard work, and church  PERSONAL FACTORS: Age, Behavior pattern, Fitness, Past/current experiences, Time since onset of injury/illness/exacerbation, and progressive chronic condition  are also affecting patient's functional outcome.   REHAB POTENTIAL: Good  CLINICAL DECISION MAKING: Evolving/moderate complexity  EVALUATION COMPLEXITY: Moderate  PLAN:  PT FREQUENCY: 2x/week  PT DURATION: 8 weeks  PLANNED INTERVENTIONS: Therapeutic exercises, Therapeutic activity, Neuromuscular re-education, Balance training, Gait training, Patient/Family education, Self Care, Joint mobilization, Stair training, DME instructions, Aquatic Therapy, Wheelchair mobility training, Biofeedback, Ionotophoresis 52m49ml Dexamethasone, Manual therapy, and Re-evaluation  PLAN FOR NEXT SESSION: Warm  up on NuStep. Continue gait training with FWW and hi[ hinging activities. Dynamic balance activities. Trial low stair toe taps w/ankle weights.  Hardin Negus, PTA 05/28/2022 2:56 PM

## 2022-06-02 ENCOUNTER — Encounter: Payer: Self-pay | Admitting: Physical Therapy

## 2022-06-02 ENCOUNTER — Ambulatory Visit: Payer: Medicare Other | Admitting: Physical Therapy

## 2022-06-02 ENCOUNTER — Ambulatory Visit: Payer: Medicare Other | Attending: Nurse Practitioner | Admitting: Physical Therapy

## 2022-06-02 DIAGNOSIS — R262 Difficulty in walking, not elsewhere classified: Secondary | ICD-10-CM | POA: Insufficient documentation

## 2022-06-02 DIAGNOSIS — R2681 Unsteadiness on feet: Secondary | ICD-10-CM | POA: Diagnosis not present

## 2022-06-02 DIAGNOSIS — R2689 Other abnormalities of gait and mobility: Secondary | ICD-10-CM | POA: Insufficient documentation

## 2022-06-02 DIAGNOSIS — R260 Ataxic gait: Secondary | ICD-10-CM | POA: Diagnosis not present

## 2022-06-02 DIAGNOSIS — M79671 Pain in right foot: Secondary | ICD-10-CM | POA: Insufficient documentation

## 2022-06-02 NOTE — Therapy (Signed)
OUTPATIENT PHYSICAL THERAPY NEURO TREATMENT   Patient Name: Justin [name] MRN: [id number] DOB:1940-11-28, 82 y.o., male Today's Date: 06/02/2022  Progress Note Reporting Period 04/29/22 to 06/02/2022  See note below for Objective Data and Assessment of Progress/Goals.      PCP: Sherrie Mustache  REFERRING PROVIDER: [name], NP  END OF SESSION:  PT End of Session - 06/02/22 1402     Visit Number 10    Number of Visits 17    Date for PT Re-Evaluation 06/24/22    Authorization Type [employer] MCR    Authorization Time Period 04/29/22 to 06/24/22    Authorization - Visit Number 10    Progress Note Due on Visit 20    PT Start Time 2992    PT Stop Time 4268    PT Time Calculation (min) 43 min    Activity Tolerance Patient tolerated treatment well    Behavior During Therapy [facility] for tasks assessed/performed               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   Diaphragmatic hernia without mention of obstruction or gangrene 1982   Hiatal hernia   Diplopia 2006   Disturbance of skin sensation 2003   Diverticulosis of colon (without mention of hemorrhage) 1997   Dizziness and giddiness 2001   Dysphagia, unspecified(787.20) 1999 and   Esophagitis, unspecified 1997   GERD (gastroesophageal reflux disease) 2003   Hypertension 2003   Impotence of organic origin 1999   Lack of coordination September 07, 2007   Lumbago 2003   Migraine with aura, without mention of intractable migraine without mention of status migrainosus 1997   Myalgia and myositis, unspecified 1999   Other and unspecified hyperlipidemia 2003   Other disorders of vitreous 2003   Other malaise and  fatigue 2003   Other seborrheic keratosis 2013   Other seborrheic keratosis 04/13/09   Pain in joint, site unspecified 04/13/12   Personal history of fall 04/15/11   Restless legs syndrome (RLS) 1997   Spinocerebellar disease, 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 Donne Hazel   C3-4 anterior cervical surgery  1999   Dr. Hal Neer   CARDIAC CATHETERIZATION  2001   Dr. Glade Lloyd   COLONOSCOPY  07/20/07   Dr. 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   Internal hemorrhoids without complication 34/19/6222   Arthritis 02/21/2014   Dysarthria 02/21/2014   Seborrheic keratosis 02/21/2014   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   Tinnitus  04/12/2013   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[place]) 10/16/2012   Impotence, 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   Essential hypertension    GERD (gastroesophageal reflux disease)    Dyslipidemia    Lumbago    Dysphagia     ONSET DATE: 04/17/2022  REFERRING DIAG: G11.9 (ICD-10-CM) - 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[place]) R26.89 (ICD-10-CM) - Imbalance  THERAPY DIAG:  Difficulty in walking, not elsewhere classified  Other abnormalities of gait and mobility  Ataxic gait  Unsteadiness on feet  Rationale for Evaluation and Treatment: Rehabilitation  SUBJECTIVE:                                                                                                                                                                                              SUBJECTIVE STATEMENT: Nothing new or different to report. Pt states heel is feeling better. 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 No - states shot in right heel yesterday really helped  PRECAUTIONS: Fall  WEIGHT BEARING RESTRICTIONS: No  FALLS: Has patient fallen in last 6 months? No; FOF (+), not hesitant to leave the house    OBJECTIVE:  Functional test: 5x STS 05/26/22: 1 min 14 sec  TODAY'S TREATMENT:    [facility] Adult PT Treatment:                                                DATE: 06/02/22 Therapeutic Exercise: NuStep warm up L6 x 6 min Standing Hip abd red TB 2x10 Hip ext red TB 2x10 Hip flex red TB 2x10 Static balance 2x30 sec CGA Static standing with UE support, diagonal reaches 2x10 Static standing, red weighted ball bilat UE flexion x10 (pt fatigues) Amb FWW x75'   [facility] Adult PT Treatment:                                                DATE: 05/28/2022 Therapeutic Activity: NuStep warm up L6 x 8 min Amb FWW + 4#AW x75' Low mat table (wedge + pillows): LTR x10 Bridges + ball squeeze x10 Side scooting (focus on hip hinge) EOB Standing balance + reach across cone hand off -->added cone  color call out (HHA prn, CGA)    PATIENT EDUCATION: Education details: Theraband tied to [place] for visual aid to improve safe maneuvering during amb/transfers Person educated: Patient and Caregiver   Education method: Explanation Education comprehension: verbalized understanding  HOME EXERCISE PROGRAM: Access Code: [email or web address] URL: [email or web address] Date: 05/08/2022 Prepared by: Elease Etienne  Exercises - Standing Tandem Balance  with Counter Support  - 1 x daily - 4-5 x weekly - 1 sets - 2 reps - 30 seconds hold - Seated March  - 1 x daily - 4-5 x weekly - 2 sets - 20 reps - Sit to Stand with Counter Support  - 1 x daily - 7 x weekly - 3 sets - 10 reps - Standing Hip Extension with Counter Support  - 1 x daily - 7 x weekly - 3 sets - 10 reps - Standing Hip Abduction with Counter Support  - 1 x daily - 7 x weekly - 3 sets - 10 reps  GOALS: Goals reviewed with patient? Yes  SHORT TERM GOALS: Target date: 05/27/2022    Will be compliant with appropriate HEP with no more than MinA from caregiver  Baseline: Goal status: MET   2.  Will be able to verbalize at least 3 ways to reduce fall risk at home and in the community  Baseline:  Goal status: MET   3.  Will be able to complete 5x sit to stand in 20 seconds or less with no posterior LOB to show improved functional mobility  Baseline: 1 min 15 sec 05/26/22 Goal status: IN PROGRESS 12/28:IN PROGRESS  4.  Will be able to ambulate at least 174f with no rest breaks and LRAD  Baseline: 2x75' with one seated rest break using RW 05/26/22 Goal status: IN PROGRESS 12/28: PARTIALLY MET   LONG TERM GOALS: Target date: 06/24/2022    Will be able to complete TUG in 40 seconds or less with LRAD and no more than Min guard to show improved balance/mobility  Baseline:  Goal status: INITIAL  2.  Pt will increase BERG balance score to >/=30/56 to demonstrate improved static balance. Baseline: 19/56 (12/4) Goal status: INITIAL  3.  Will be able to navigate single step with LRAD and no more than Min guard for safety  Baseline:  Goal status: INITIAL  4.  Pain in R ankle to be no more than 5/10 at worst  Baseline:  Goal status: INITIAL  5.  Will be able to ambulate at least 2259fwith LRAD, no rest breaks, and Min guard to show improved functional mobility  Baseline:  Goal status: INITIAL    ASSESSMENT:  CLINICAL IMPRESSION: Treatment focused on advancing  pt's HEP with resistance. Able to tolerate well. Continued to work on improving standing endurance and balance. Lumbar extensors fatigue with prolonged standing exercises. Pt is demonstrating improving standing balance and strength. He will benefit from continued PT to meet his LTGs.   OBJECTIVE IMPAIRMENTS: Abnormal gait, decreased activity tolerance, decreased balance, decreased coordination, decreased knowledge of use of DME, decreased mobility, difficulty walking, decreased strength, and pain.   ACTIVITY LIMITATIONS: standing, stairs, transfers, bed mobility, and locomotion level  PARTICIPATION LIMITATIONS: community activity, yard work, and church  PERSONAL FACTORS: Age, Behavior pattern, Fitness, Past/current experiences, Time since onset of injury/illness/exacerbation, and progressive chronic condition  are also affecting patient's functional outcome.   REHAB POTENTIAL: Good  CLINICAL DECISION MAKING: Evolving/moderate complexity  EVALUATION COMPLEXITY: Moderate  PLAN:  PT FREQUENCY: 2x/week  PT DURATION: 8 weeks  PLANNED INTERVENTIONS: Therapeutic exercises, Therapeutic activity, Neuromuscular re-education, Balance training, Gait training, Patient/Family education, Self Care, Joint mobilization, Stair training, DME instructions, Aquatic Therapy, Wheelchair mobility training, Biofeedback, Ionotophoresis 66m/ml Dexamethasone, Manual therapy, and Re-evaluation  PLAN FOR NEXT SESSION: Warm up on NuStep. Continue gait training with FWW and hi[ hinging activities. Dynamic balance activities. Trial low stair toe taps w/ankle weights.  Wyndi Northrup April MGordy Levan PT 06/02/2022 2:02 PM

## 2022-06-04 ENCOUNTER — Ambulatory Visit: Payer: BLUE CROSS/BLUE SHIELD | Admitting: Vascular Surgery

## 2022-06-04 ENCOUNTER — Encounter: Payer: Self-pay | Admitting: Vascular Surgery

## 2022-06-04 ENCOUNTER — Ambulatory Visit (HOSPITAL_COMMUNITY)
Admission: RE | Admit: 2022-06-04 | Discharge: 2022-06-04 | Disposition: A | Discharge status: Home / Self Care | Payer: Medicare Other | Source: Ambulatory Visit | Attending: Vascular Surgery | Admitting: Vascular Surgery

## 2022-06-04 VITALS — BP 149/90 | HR 71 | Temp 98.3°F | Resp 20 | Ht 67.0 in | Wt 179.0 lb

## 2022-06-04 DIAGNOSIS — I7102 Dissection of abdominal aorta: Secondary | ICD-10-CM

## 2022-06-04 NOTE — Progress Notes (Signed)
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  This has been stable for many years now.  There is been no aneurysmal degeneration of his aorta which is 2.1 cm in maximum diameter.  I do not think this needs continued follow-up.  PERIPHERAL ARTERIAL DISEASE: On exam today I did note that he had a monophasic dorsalis pedis signal on the right with a barely audible posterior tibial signal.  On the left side he has biphasic signals.  I do not get any history of claudication although his activity is fairly limited.  Currently he does not describe any rest pain or nonhealing ulcers.  I think it would be reasonable to get an arterial Doppler study in 1 year and I have ordered that test and he will be seen by the PAs at that time.  I explained to him that I will be retiring in September.  He is not a smoker.  HPI:   Justin [name] is a pleasant 82 y.o. male who I last saw on 01/04/2019.  He was having chest pain and shortness of breath back in 2018.  This prompted a CT angiogram.  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  I felt that this was likely chronic.  When I saw him last, duplex scan showed no evidence of aneurysmal degeneration of the aorta.  Maximum diameter was 2.4 cm.  Given the history of a small dissection and small risk of aneurysmal degeneration I set him up for a 3-year follow-up study.  I felt that if this looked good we could extend the follow-up out to 5 years.  At that time his blood pressure was under good control.  Since I saw him last he has had some right heel pain and had an injection by the podiatrist that helped significantly.  I do not get any history of claudication although his activity is very limited.  He is in a wheelchair.  He denies any history of rest pain or nonhealing ulcers.  He has a speech issue which he says is not related to a  stroke.     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   Diaphragmatic hernia without mention of obstruction or gangrene 1982   Hiatal hernia   Diplopia 2006   Disturbance of skin sensation 2003   Diverticulosis of colon (without mention of hemorrhage) 1997   Dizziness and giddiness 2001   Dysphagia, unspecified(787.20) 1999 and   Esophagitis, unspecified 1997   GERD (gastroesophageal reflux disease) 2003   Hypertension 2003   Impotence of organic origin 1999   Lack of coordination September 07, 2007   Lumbago 2003   Migraine with aura, without mention of intractable migraine without mention of status migrainosus 1997   Myalgia and myositis, unspecified 1999   Other and unspecified hyperlipidemia 2003   Other disorders of vitreous 2003   Other malaise and fatigue 2003   Other seborrheic keratosis 2013   Other seborrheic keratosis 04/13/09   Pain in joint, site unspecified 04/13/12   Personal  history of fall 04/15/11   Restless legs syndrome (RLS) 1997   Spinocerebellar disease, 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, 2008   Unspecified hearing loss 2003   Unspecified tinnitus 04/15/11    Family History  Problem Relation Age of Onset   Heart disease Father        SCA with multiple family members with SCA   Diabetes Neg Hx    Colon cancer Neg Hx    Colon polyps Neg Hx    Esophageal cancer Neg Hx    Kidney disease Neg Hx    Gallbladder disease Neg Hx     SOCIAL HISTORY: Social History   Tobacco Use   Smoking status: Never   Smokeless tobacco: Former    Types: Chew    Quit date: 06/02/1979   Tobacco comments:    Stopped Chew about 1981  Substance Use Topics   Alcohol use: Not  Currently    Allergies  Allergen Reactions   Effexor [Venlafaxine Hcl]    Serzone [Nefazodone]    Vivactil [Protriptyline Hcl]     Current Outpatient Medications  Medication Sig Dispense Refill   acetaminophen (TYLENOL 8 HOUR ARTHRITIS PAIN) 650 MG CR tablet Take 1 tablet (650 mg total) by mouth 2 (two) times daily. 60 tablet 3   amLODipine (NORVASC) 5 MG tablet TAKE 1 TABLET BY MOUTH EVERY DAY 90 tablet 2   fenofibrate (TRICOR) 145 MG tablet Take 1 tablet (145 mg total) by mouth daily. 90 tablet 1   hydrochlorothiazide (HYDRODIURIL) 12.5 MG tablet TAKE 1 TABLET BY MOUTH EVERY DAY 90 tablet 3   losartan (COZAAR) 100 MG tablet TAKE 1 TABLET (100 MG TOTAL) BY MOUTH DAILY 180 tablet 2   oxybutynin (DITROPAN) 5 MG tablet TAKE 1 TABLET BY MOUTH EVERY DAY 90 tablet 1   pantoprazole (PROTONIX) 40 MG tablet TAKE 1 TABLET BY MOUTH EVERY DAY 90 tablet 3   potassium chloride (KLOR-CON) 10 MEQ tablet Take 1 tablet (10 mEq total) by mouth daily. 30 tablet 11   pregabalin (LYRICA) 25 MG capsule TAKE 1 CAPSULE BY MOUTH 3 TIMES A DAY 270 capsule 1   propranolol (INDERAL) 40 MG tablet TAKE 1 TABLET BY MOUTH TWICE A DAY 180 tablet 1   No current facility-administered medications for this visit.    REVIEW OF SYSTEMS:  '[X]'$  denotes positive finding, '[ ]'$  denotes negative finding Cardiac  Comments:  Chest pain or chest pressure:    Shortness of breath upon exertion: x   Short of breath when lying flat:    Irregular heart rhythm:        Vascular    Pain in calf, thigh, or hip brought on by ambulation:    Pain in feet at night that wakes you up from your sleep:     Blood clot in your veins:    Leg swelling:  x       Pulmonary    Oxygen at home:    Productive cough:     Wheezing:         Neurologic    Sudden weakness in arms or legs:     Sudden numbness in arms or legs:     Sudden onset of difficulty speaking or slurred speech: x   Temporary loss of vision in one eye:     Problems with  dizziness:         Gastrointestinal    Blood in stool:     Vomited blood:         Genitourinary  Burning when urinating:     Blood in urine:        Psychiatric    Major depression:         Hematologic    Bleeding problems:    Problems with blood clotting too easily:        Skin    Rashes or ulcers:        Constitutional    Fever or chills:     PHYSICAL EXAM:   Vitals:   06/04/22 0938  BP: (!) 149/90  Pulse: 71  Resp: 20  Temp: 98.3 F (36.8 C)  SpO2: 95%  Weight: 179 lb (81.2 kg)  Height: '5\' 7"'$  (1.702 m)    GENERAL: The patient is a well-nourished male, in no acute distress. The vital signs are documented above. CARDIAC: There is a regular rate and rhythm.  VASCULAR: I do not detect carotid bruits. He has palpable femoral pulses. On the right side he has a monophasic dorsalis pedis signal.  He has a barely audible posterior tibial signal. On the left side he has a biphasic dorsalis pedis and posterior tibial signal. PULMONARY: There is good air exchange bilaterally without wheezing or rales. ABDOMEN: Soft and non-tender with normal pitched bowel sounds.  MUSCULOSKELETAL: There are no major deformities or cyanosis. NEUROLOGIC: No focal weakness or paresthesias are detected. SKIN: There are no ulcers or rashes noted. PSYCHIATRIC: The patient has a normal affect.  DATA:    DUPLEX ABDOMINAL AORTA: I have independently interpreted his duplex of the abdominal aorta.  The maximum diameter of his aorta is 2.1 cm.  The right common iliac artery measures 1.2 cm in maximum diameter.  The left common iliac artery measures 1.2 cm in maximum diameter.  The focal dissection in the infrarenal aorta is unchanged.  Deitra Mayo [facility] [phone]

## 2022-06-05 ENCOUNTER — Ambulatory Visit: Payer: Medicare Other | Admitting: Physical Therapy

## 2022-06-05 ENCOUNTER — Ambulatory Visit: Payer: Medicare Other

## 2022-06-05 DIAGNOSIS — M79671 Pain in right foot: Secondary | ICD-10-CM

## 2022-06-05 DIAGNOSIS — R2689 Other abnormalities of gait and mobility: Secondary | ICD-10-CM

## 2022-06-05 DIAGNOSIS — R260 Ataxic gait: Secondary | ICD-10-CM

## 2022-06-05 DIAGNOSIS — R262 Difficulty in walking, not elsewhere classified: Secondary | ICD-10-CM

## 2022-06-05 DIAGNOSIS — R2681 Unsteadiness on feet: Secondary | ICD-10-CM

## 2022-06-05 NOTE — Therapy (Signed)
OUTPATIENT PHYSICAL THERAPY NEURO TREATMENT   Patient Name: Justin [name] MRN: [id number] DOB:[birth year], 82 y.o., male Today's Date: 06/05/2022  PCP: Sherrie Mustache  REFERRING PROVIDER: [name], NP  END OF SESSION:  PT End of Session - 06/05/22 1103     Visit Number 11    Number of Visits 17    Date for PT Re-Evaluation 06/24/22    PT Start Time 1105    PT Stop Time 1153    PT Time Calculation (min) 48 min               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   Diaphragmatic hernia without mention of obstruction or gangrene 1982   Hiatal hernia   Diplopia 2006   Disturbance of skin sensation 2003   Diverticulosis of colon (without mention of hemorrhage) 1997   Dizziness and giddiness 2001   Dysphagia, unspecified(787.20) 1999 and   Esophagitis, unspecified 1997   GERD (gastroesophageal reflux disease) 2003   Hypertension 2003   Impotence of organic origin 1999   Lack of coordination September 07, 2007   Lumbago 2003   Migraine with aura, without mention of intractable migraine without mention of status migrainosus 1997   Myalgia and myositis, unspecified 1999   Other and unspecified hyperlipidemia 2003   Other disorders of vitreous 2003   Other malaise and fatigue 2003   Other seborrheic keratosis 2013   Other seborrheic keratosis 04/13/09   Pain in joint, site unspecified 04/13/12   Personal history of fall 04/15/11   Restless legs syndrome (RLS) 1997   Spinocerebellar disease, 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 Donne Hazel   C3-4 anterior cervical surgery  1999   Dr. Hal Neer   CARDIAC CATHETERIZATION  2001   Dr. Glade Lloyd   COLONOSCOPY  07/20/07   Dr. 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   Internal hemorrhoids without complication 25/42/7062   Arthritis 02/21/2014   Dysarthria 02/21/2014   Seborrheic keratosis 02/21/2014   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   Tinnitus 04/12/2013   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[place]) 10/16/2012   Impotence, 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   Essential hypertension    GERD (gastroesophageal reflux disease)    Dyslipidemia    Lumbago    Dysphagia     ONSET DATE: 04/17/2022  REFERRING DIAG: G11.9 (ICD-10-CM) - 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[place]) R26.89 (ICD-10-CM) - Imbalance  THERAPY DIAG:  Difficulty in walking, not elsewhere classified  Ataxic gait  Other abnormalities of gait and mobility  Unsteadiness on feet  Pain of right heel  Rationale for Evaluation and Treatment: Rehabilitation  SUBJECTIVE:                                                                                                                                                                                             SUBJECTIVE STATEMENT: Patient reports he saw doctor and found he has circulation issues in R LE/foot. Patient states he had difficulty moving R LE this morning.   Pt accompanied by:  caregiver  [name]  PERTINENT HISTORY:  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[place]) -progressive,  continues with supportive care - Ambulatory referral to Physical Therapy      PAIN:  Are you having pain? No - states shot in right heel yesterday really helped  PRECAUTIONS: Fall  WEIGHT BEARING RESTRICTIONS: No  FALLS: Has patient fallen in last 6 months? No; FOF (+), not hesitant to leave the house    OBJECTIVE:  Functional test: 5x STS 05/26/22: 1 min 14 sec  TODAY'S TREATMENT:     [facility] Adult PT Treatment:                                                DATE: 06/05/2022 Therapeutic Activity: Fwd amb FWW CGA/SBA  Alternating toe taps 4" step + 4#AW Fwd marching 4#AW 1L Standing spinal extension to improve upright posture (counter) Standing balance + reaching lateral and across midline CGA NuStep to promote reciprocal gate pattern (L5) x 60mn   [facility] Adult PT Treatment:                                                DATE: 06/02/22 Therapeutic Exercise: NuStep warm up L6 x 6 min Standing Hip abd red TB 2x10 Hip ext red TB 2x10 Hip flex red TB 2x10 Static balance 2x30 sec CGA Static standing with UE support, diagonal reaches 2x10 Static standing, red weighted ball bilat UE flexion x10 (pt fatigues) Amb FWW x75'    PATIENT EDUCATION: Education details: Progress HEP  Person educated: Patient and Caregiver   Education method: Explanation Education comprehension: verbalized understanding  HOME EXERCISE PROGRAM: Access Code: [email or web address]: [email or web address] Date: 06/05/2022 Prepared by: KHelane Gunther Exercises - Standing Tandem Balance with Counter Support  - 1 x daily - 4-5 x weekly - 1 sets - 2 reps - 30 seconds hold - Seated  March  - 1 x daily - 4-5 x weekly - 2 sets - 20 reps - Sit to Stand with Counter Support  - 1 x daily - 7 x weekly - 3 sets - 10 reps - Seated Heel Toe Raises  - 1 x daily - 7 x weekly - 2 sets - 10 reps - Standing Hip Extension Kicks  - 1 x daily - 7 x weekly - 3 sets - 10 reps - Standing Hip Abduction Kicks  - 1 x daily -  7 x weekly - 3 sets - 10 reps - Standing Hip Flexion with Resistance Loop  - 1 x daily - 7 x weekly - 3 sets - 10 reps - Standing Lumbar Extension with Counter  - 1 x daily - 7 x weekly - 3 sets - 10 reps - Seated March  - 1 x daily - 7 x weekly - 3 sets - 10 reps  GOALS: Goals reviewed with patient? Yes  SHORT TERM GOALS: Target date: 05/27/2022    Will be compliant with appropriate HEP with no more than MinA from caregiver  Baseline: Goal status: MET   2.  Will be able to verbalize at least 3 ways to reduce fall risk at home and in the community  Baseline:  Goal status: MET   3.  Will be able to complete 5x sit to stand in 20 seconds or less with no posterior LOB to show improved functional mobility  Baseline: 1 min 15 sec 05/26/22 Goal status: IN PROGRESS 12/28:IN PROGRESS  4.  Will be able to ambulate at least 162f with no rest breaks and LRAD  Baseline: 2x75' with one seated rest break using RW 05/26/22 Goal status: IN PROGRESS 12/28: PARTIALLY MET   LONG TERM GOALS: Target date: 06/24/2022    Will be able to complete TUG in 40 seconds or less with LRAD and no more than Min guard to show improved balance/mobility  Baseline:  Goal status: INITIAL  2.  Pt will increase BERG balance score to >/=30/56 to demonstrate improved static balance. Baseline: 19/56 (12/4) Goal status: INITIAL  3.  Will be able to navigate single step with LRAD and no more than Min guard for safety  Baseline:  Goal status: INITIAL  4.  Pain in R ankle to be no more than 5/10 at worst  Baseline:  Goal status: INITIAL  5.  Will be able to ambulate at least 226fwith LRAD, no rest breaks, and Min guard to show improved functional mobility  Baseline:  Goal status: INITIAL    ASSESSMENT:  CLINICAL IMPRESSION: Standing spinal extension added today to address forward flexed hip posture; patient reported noted decreased in lumbar discomfort. Dynamic balance activities progressed with  alternating toe taps and reaching across midline; noted limited range noted when reaching with L EU, especially across midline. Marching incorporated to progress safe foot clearance during ambulation.  OBJECTIVE IMPAIRMENTS: Abnormal gait, decreased activity tolerance, decreased balance, decreased coordination, decreased knowledge of use of DME, decreased mobility, difficulty walking, decreased strength, and pain.   ACTIVITY LIMITATIONS: standing, stairs, transfers, bed mobility, and locomotion level  PARTICIPATION LIMITATIONS: community activity, yard work, and church  PERSONAL FACTORS: Age, Behavior pattern, Fitness, Past/current experiences, Time since onset of injury/illness/exacerbation, and progressive chronic condition  are also affecting patient's functional outcome.   REHAB POTENTIAL: Good  CLINICAL DECISION MAKING: Evolving/moderate complexity  EVALUATION COMPLEXITY: Moderate  PLAN:  PT FREQUENCY: 2x/week  PT DURATION: 8 weeks  PLANNED INTERVENTIONS: Therapeutic exercises,  Therapeutic activity, Neuromuscular re-education, Balance training, Gait training, Patient/Family education, Self Care, Joint mobilization, Stair training, DME instructions, Aquatic Therapy, Wheelchair mobility training, Biofeedback, Ionotophoresis '4mg'$ /ml Dexamethasone, Manual therapy, and Re-evaluation  PLAN FOR NEXT SESSION: Warm up on NuStep. Continue gait training with FWW and hip hinging activities. Dynamic balance activities. Spinal extension.  Hardin Negus, PTA 06/05/2022 11:58 AM

## 2022-06-07 ENCOUNTER — Other Ambulatory Visit: Payer: Self-pay | Admitting: Nurse Practitioner

## 2022-06-08 ENCOUNTER — Ambulatory Visit: Payer: Medicare Other

## 2022-06-08 DIAGNOSIS — M79671 Pain in right foot: Secondary | ICD-10-CM | POA: Diagnosis not present

## 2022-06-08 DIAGNOSIS — R260 Ataxic gait: Secondary | ICD-10-CM | POA: Diagnosis not present

## 2022-06-08 DIAGNOSIS — R2681 Unsteadiness on feet: Secondary | ICD-10-CM | POA: Diagnosis not present

## 2022-06-08 DIAGNOSIS — R262 Difficulty in walking, not elsewhere classified: Secondary | ICD-10-CM | POA: Diagnosis not present

## 2022-06-08 DIAGNOSIS — R2689 Other abnormalities of gait and mobility: Secondary | ICD-10-CM | POA: Diagnosis not present

## 2022-06-08 NOTE — Therapy (Signed)
OUTPATIENT PHYSICAL THERAPY NEURO TREATMENT   Patient Name: Justin [name] MRN: [id number] DOB:12-Jan-1941, 82 y.o., male Today's Date: 06/08/2022  PCP: Sherrie Mustache  REFERRING PROVIDER: [name], NP  END OF SESSION:  PT End of Session - 06/08/22 1403     Visit Number 12    Number of Visits 17    Date for PT Re-Evaluation 06/24/22    PT Start Time 8563    PT Stop Time 1448    PT Time Calculation (min) 45 min    Activity Tolerance Patient tolerated treatment well    Behavior During Therapy [facility] for tasks assessed/performed               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   Diaphragmatic hernia without mention of obstruction or gangrene 1982   Hiatal hernia   Diplopia 2006   Disturbance of skin sensation 2003   Diverticulosis of colon (without mention of hemorrhage) 1997   Dizziness and giddiness 2001   Dysphagia, unspecified(787.20) 1999 and   Esophagitis, unspecified 1997   GERD (gastroesophageal reflux disease) 2003   Hypertension 2003   Impotence of organic origin 1999   Lack of coordination September 07, 2007   Lumbago 2003   Migraine with aura, without mention of intractable migraine without mention of status migrainosus 1997   Myalgia and myositis, unspecified 1999   Other and unspecified hyperlipidemia 2003   Other disorders of vitreous 2003   Other malaise and fatigue 2003   Other seborrheic keratosis 2013   Other seborrheic keratosis 04/13/09   Pain in joint, site unspecified 04/13/12   Personal history of fall 04/15/11   Restless legs syndrome (RLS) 1997   Spinocerebellar disease, 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 Donne Hazel   C3-4 anterior cervical surgery  1999   Dr. Hal Neer   CARDIAC CATHETERIZATION  2001   Dr. Glade Lloyd   COLONOSCOPY  07/20/07   Dr. 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   Internal hemorrhoids without complication 14/97/0263   Arthritis 02/21/2014   Dysarthria 02/21/2014   Seborrheic keratosis 02/21/2014   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   Tinnitus 04/12/2013   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[place]) 10/16/2012   Impotence, 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   Essential hypertension    GERD (gastroesophageal reflux disease)    Dyslipidemia    Lumbago    Dysphagia     ONSET DATE: 04/17/2022  REFERRING DIAG: G11.9 (ICD-10-CM) - 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 Patient states he plans on using long sofa for walking at home.  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 No - states shot in right heel yesterday really helped  PRECAUTIONS: Fall  WEIGHT BEARING RESTRICTIONS: No  FALLS: Has patient fallen in last 6 months? No; FOF (+), not hesitant to leave the house    OBJECTIVE:  Functional test: 5x STS 05/26/22: 1 min 14 sec  TODAY'S TREATMENT:     [facility] Adult PT Treatment:                                                DATE: 06/08/2022 Therapeutic Activity: NuStep warm up L6 x 30mn Amb with 4#AW CGA/SBA x80' Bkwd amb 4#AW CGA 10'x2 Standing at counter: Standing balance reaching multi-directions beyond BOS + gaze fixation HHAx1-->no HHA CGA Reaching cones into overhead cabinets CGA   [facility] Adult PT Treatment:                                                DATE: 06/05/2022 Therapeutic Activity: Fwd amb FWW CGA/SBA  Alternating toe taps 4" step + 4#AW Fwd marching 4#AW 1L Standing spinal extension to improve upright posture (counter) Standing balance + reaching lateral and across midline CGA NuStep to promote reciprocal gate pattern (L5) x 5105m   PATIENT EDUCATION: Education details:Walking more & being more active throughout day  Person educated: Patient and Caregiver   Education method: Explanation Education comprehension: verbalized understanding  HOME EXERCISE PROGRAM: Access Code: [email or web address]: [email or web address] Date: 06/05/2022 Prepared by: KaHelane GuntherExercises - Standing Tandem Balance with Counter Support  - 1 x daily - 4-5 x weekly - 1 sets - 2  reps - 30 seconds hold - Seated March  - 1 x daily - 4-5 x weekly - 2 sets - 20 reps - Sit to Stand with Counter Support  - 1 x daily - 7 x weekly - 3 sets - 10 reps - Seated Heel Toe Raises  - 1 x daily - 7 x weekly - 2 sets - 10 reps - Standing Hip  Extension Kicks  - 1 x daily - 7 x weekly - 3 sets - 10 reps - Standing Hip Abduction Kicks  - 1 x daily - 7 x weekly - 3 sets - 10 reps - Standing Hip Flexion with Resistance Loop  - 1 x daily - 7 x weekly - 3 sets - 10 reps - Standing Lumbar Extension with Counter  - 1 x daily - 7 x weekly - 3 sets - 10 reps - Seated March  - 1 x daily - 7 x weekly - 3 sets - 10 reps  GOALS: Goals reviewed with patient? Yes  SHORT TERM GOALS: Target date: 05/27/2022    Will be compliant with appropriate HEP with no more than MinA from caregiver  Baseline: Goal status: MET   2.  Will be able to verbalize at least 3 ways to reduce fall risk at home and in the community  Baseline:  Goal status: MET   3.  Will be able to complete 5x sit to stand in 20 seconds or less with no posterior LOB to show improved functional mobility  Baseline: 1 min 15 sec 05/26/22 Goal status: IN PROGRESS 12/28:IN PROGRESS  4.  Will be able to ambulate at least 180f with no rest breaks and LRAD  Baseline: 2x75' with one seated rest break using RW 05/26/22 Goal status: IN PROGRESS 12/28: PARTIALLY MET   LONG TERM GOALS: Target date: 06/24/2022    Will be able to complete TUG in 40 seconds or less with LRAD and no more than Min guard to show improved balance/mobility  Baseline:  Goal status: INITIAL  2.  Pt will increase BERG balance score to >/=30/56 to demonstrate improved static balance. Baseline: 19/56 (12/4) Goal status: INITIAL  3.  Will be able to navigate single step with LRAD and no more than Min guard for safety  Baseline:  Goal status: INITIAL  4.  Pain in R ankle to be no more than 5/10 at worst  Baseline:  Goal status: INITIAL  5.  Will be able to ambulate at least 2287fwith LRAD, no rest breaks, and Min guard to show improved functional mobility  Baseline:  Goal status: INITIAL    ASSESSMENT:  CLINICAL IMPRESSION:  Backwards ambulation introduced to progress functional hip  strengthening and challenge dynamic balance; verbal cues provided to improve safe FWW navigation. Patient exhibited mild instability and required CGA due to mild postural sway exhibited during ambulation. Discussion with patient and caregiver on increasing activity throughout the day to build endurance and strength.  OBJECTIVE IMPAIRMENTS: Abnormal gait, decreased activity tolerance, decreased balance, decreased coordination, decreased knowledge of use of DME, decreased mobility, difficulty walking, decreased strength, and pain.   ACTIVITY LIMITATIONS: standing, stairs, transfers, bed mobility, and locomotion level  PARTICIPATION LIMITATIONS: community activity, yard work, and church  PERSONAL FACTORS: Age, Behavior pattern, Fitness, Past/current experiences, Time since onset of injury/illness/exacerbation, and progressive chronic condition  are also affecting patient's functional outcome.   REHAB POTENTIAL: Good  CLINICAL DECISION MAKING: Evolving/moderate complexity  EVALUATION COMPLEXITY: Moderate  PLAN:  PT FREQUENCY: 2x/week  PT  DURATION: 8 weeks  PLANNED INTERVENTIONS: Therapeutic exercises, Therapeutic activity, Neuromuscular re-education, Balance training, Gait training, Patient/Family education, Self Care, Joint mobilization, Stair training, DME instructions, Aquatic Therapy, Wheelchair mobility training, Biofeedback, Ionotophoresis '4mg'$ /ml Dexamethasone, Manual therapy, and Re-evaluation  PLAN FOR NEXT SESSION: Dynamic balance activities; progress amb in all directions & endurance  Hardin Negus, PTA 06/08/2022 2:55 PM

## 2022-06-09 ENCOUNTER — Ambulatory Visit: Payer: Medicare Other | Admitting: Physical Therapy

## 2022-06-11 ENCOUNTER — Ambulatory Visit: Payer: Medicare Other | Admitting: Physical Therapy

## 2022-06-11 ENCOUNTER — Encounter: Payer: Self-pay | Admitting: Physical Therapy

## 2022-06-11 DIAGNOSIS — R2681 Unsteadiness on feet: Secondary | ICD-10-CM | POA: Diagnosis not present

## 2022-06-11 DIAGNOSIS — M79671 Pain in right foot: Secondary | ICD-10-CM

## 2022-06-11 DIAGNOSIS — R260 Ataxic gait: Secondary | ICD-10-CM

## 2022-06-11 DIAGNOSIS — R262 Difficulty in walking, not elsewhere classified: Secondary | ICD-10-CM | POA: Diagnosis not present

## 2022-06-11 DIAGNOSIS — R2689 Other abnormalities of gait and mobility: Secondary | ICD-10-CM

## 2022-06-11 NOTE — Therapy (Signed)
OUTPATIENT PHYSICAL THERAPY NEURO TREATMENT   Patient Name: Justin [name] MRN: [id number] DOB:05-01-1941, 82 y.o., male Today's Date: 06/11/2022  PCP: Sherrie Mustache  REFERRING PROVIDER: [name], NP  END OF SESSION:  PT End of Session - 06/11/22 1412     Visit Number 13    Number of Visits 17    Date for PT Re-Evaluation 06/24/22    Authorization Type [employer] MCR    Authorization Time Period 04/29/22 to 06/24/22    Progress Note Due on Visit 20    PT Start Time 1405    PT Stop Time 1445    PT Time Calculation (min) 40 min    Activity Tolerance Patient tolerated treatment well    Behavior During Therapy [facility] for tasks assessed/performed                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   Diaphragmatic hernia without mention of obstruction or gangrene 1982   Hiatal hernia   Diplopia 2006   Disturbance of skin sensation 2003   Diverticulosis of colon (without mention of hemorrhage) 1997   Dizziness and giddiness 2001   Dysphagia, unspecified(787.20) 1999 and   Esophagitis, unspecified 1997   GERD (gastroesophageal reflux disease) 2003   Hypertension 2003   Impotence of organic origin 1999   Lack of coordination September 07, 2007   Lumbago 2003   Migraine with aura, without mention of intractable migraine without mention of status migrainosus 1997   Myalgia and myositis, unspecified 1999   Other and unspecified hyperlipidemia 2003   Other disorders of vitreous 2003   Other malaise and fatigue 2003   Other seborrheic keratosis 2013   Other seborrheic keratosis 04/13/09   Pain in joint, site unspecified 04/13/12   Personal history of fall 04/15/11    Restless legs syndrome (RLS) 1997   Spinocerebellar disease, 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 Donne Hazel   C3-4 anterior cervical surgery  1999   Dr. Hal Neer   CARDIAC CATHETERIZATION  2001   Dr. Glade Lloyd   COLONOSCOPY  07/20/07   Dr. 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   Internal hemorrhoids without complication 38/75/6433   Arthritis 02/21/2014   Dysarthria 02/21/2014   Seborrheic keratosis 02/21/2014   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   Tinnitus 04/12/2013   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[place]) 10/16/2012   Impotence, 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   Essential hypertension    GERD (gastroesophageal reflux disease)    Dyslipidemia  Lumbago    Dysphagia     ONSET DATE: 04/17/2022  REFERRING DIAG: G11.9 (ICD-10-CM) - 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[place]) R26.89 (ICD-10-CM) - Imbalance  THERAPY DIAG:  Difficulty in walking, not elsewhere classified  Ataxic gait  Other abnormalities of gait and mobility  Unsteadiness on feet  Pain of right heel  Rationale for Evaluation and Treatment: Rehabilitation  SUBJECTIVE:                                                                                                                                                                                             SUBJECTIVE STATEMENT: Pt notes  increased soreness after the backwards walking.   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 No  PRECAUTIONS: Fall  WEIGHT BEARING RESTRICTIONS: No  FALLS: Has patient fallen in last 6 months? No; FOF (+), not hesitant to leave the house    OBJECTIVE:  Functional test: 5x STS 05/26/22: 1 min 14 sec 5x STS 06/11/22: 32 sec no UE support  TODAY'S TREATMENT:     [facility] Adult PT Treatment:                                                DATE: 06/11/2022 Therapeutic Exercise: Nustep L6-L7 x 6 min, UEs/LEs Sit<>stand x5 Standing at counter Hands on cabinets + glute sets  Backwards step tap x10 Side stepping with wash rag x10 Sitting hip flexor stretch 2x30 sec Neuromuscular re-ed: Static balance feet apart 3x30 sec Hands on cabinets, alternating UE raises x10 Hands on cabinets, bilat UE raises 2x10    [facility] Adult PT Treatment:                                                DATE: 06/08/2022 Therapeutic Activity: NuStep warm up L6 x 50mn Amb with 4#AW CGA/SBA x80' Bkwd amb 4#AW CGA 10'x2 Standing at counter: Standing balance reaching multi-directions beyond BOS + gaze fixation HHAx1-->no HHA CGA Reaching cones into overhead cabinets CGA   PATIENT EDUCATION: Education details:Walking more & being more active throughout day  Person educated: Patient and Caregiver   Education method: Explanation Education comprehension: verbalized understanding  HOME EXERCISE PROGRAM: Access Code: [email or web address]: [email or web address] Date:  06/05/2022 Prepared by: Helane Gunther  Exercises - Standing Tandem Balance with Counter Support  - 1 x daily - 4-5 x weekly - 1 sets - 2 reps - 30 seconds hold - Seated March  - 1 x daily - 4-5 x weekly - 2 sets - 20 reps - Sit to Stand with Counter Support  - 1 x daily - 7  x weekly - 3 sets - 10 reps - Seated Heel Toe Raises  - 1 x daily - 7 x weekly - 2 sets - 10 reps - Standing Hip Extension Kicks  - 1 x daily - 7 x weekly - 3 sets - 10 reps - Standing Hip Abduction Kicks  - 1 x daily - 7 x weekly - 3 sets - 10 reps - Standing Hip Flexion with Resistance Loop  - 1 x daily - 7 x weekly - 3 sets - 10 reps - Standing Lumbar Extension with Counter  - 1 x daily - 7 x weekly - 3 sets - 10 reps - Seated March  - 1 x daily - 7 x weekly - 3 sets - 10 reps  GOALS: Goals reviewed with patient? Yes  SHORT TERM GOALS: Target date: 05/27/2022    Will be compliant with appropriate HEP with no more than MinA from caregiver  Baseline: Goal status: MET   2.  Will be able to verbalize at least 3 ways to reduce fall risk at home and in the community  Baseline:  Goal status: MET   3.  Will be able to complete 5x sit to stand in 20 seconds or less with no posterior LOB to show improved functional mobility  Baseline: 1 min 15 sec 05/26/22 Goal status: IN PROGRESS 12/28:IN PROGRESS  4.  Will be able to ambulate at least 135f with no rest breaks and LRAD  Baseline: 2x75' with one seated rest break using RW 05/26/22 Goal status: IN PROGRESS 12/28: PARTIALLY MET   LONG TERM GOALS: Target date: 06/24/2022    Will be able to complete TUG in 40 seconds or less with LRAD and no more than Min guard to show improved balance/mobility  Baseline:  Goal status: INITIAL  2.  Pt will increase BERG balance score to >/=30/56 to demonstrate improved static balance. Baseline: 19/56 (12/4) Goal status: INITIAL  3.  Will be able to navigate single step with LRAD and no more than Min guard for safety  Baseline:  Goal status: INITIAL  4.  Pain in R ankle to be no more than 5/10 at worst  Baseline:  Goal status: INITIAL  5.  Will be able to ambulate at least 2218fwith LRAD, no rest breaks, and Min guard to show improved functional mobility  Baseline:  Goal status:  INITIAL    ASSESSMENT:  CLINICAL IMPRESSION: Continued to work on postural stability, hip strength and standing tolerance. Pt is tolerating increase of standing tasks and improving trunk extension. Encouraged pt to try and stand and brush his teeth.   OBJECTIVE IMPAIRMENTS: Abnormal gait, decreased activity tolerance, decreased balance, decreased coordination, decreased knowledge of use of DME, decreased mobility, difficulty walking, decreased strength, and pain.   ACTIVITY LIMITATIONS: standing, stairs, transfers, bed mobility, and locomotion level  PARTICIPATION LIMITATIONS: community activity, yard work, and church  PERSONAL FACTORS: Age, Behavior pattern, Fitness, Past/current experiences, Time since onset of injury/illness/exacerbation, and progressive chronic condition  are also affecting patient's functional outcome.   REHAB POTENTIAL: Good  CLINICAL DECISION MAKING: Evolving/moderate complexity  EVALUATION COMPLEXITY:  Moderate  PLAN:  PT FREQUENCY: 2x/week  PT DURATION: 8 weeks  PLANNED INTERVENTIONS: Therapeutic exercises, Therapeutic activity, Neuromuscular re-education, Balance training, Gait training, Patient/Family education, Self Care, Joint mobilization, Stair training, DME instructions, Aquatic Therapy, Wheelchair mobility training, Biofeedback, Ionotophoresis '4mg'$ /ml Dexamethasone, Manual therapy, and Re-evaluation  PLAN FOR NEXT SESSION: Dynamic balance activities; progress amb in all directions & endurance  Laquanna Veazey April Gordy Levan, PT 06/11/2022 2:13 PM

## 2022-06-15 ENCOUNTER — Ambulatory Visit: Payer: Medicare Other

## 2022-06-15 DIAGNOSIS — R2681 Unsteadiness on feet: Secondary | ICD-10-CM | POA: Diagnosis not present

## 2022-06-15 DIAGNOSIS — R2689 Other abnormalities of gait and mobility: Secondary | ICD-10-CM

## 2022-06-15 DIAGNOSIS — R260 Ataxic gait: Secondary | ICD-10-CM

## 2022-06-15 DIAGNOSIS — R262 Difficulty in walking, not elsewhere classified: Secondary | ICD-10-CM

## 2022-06-15 DIAGNOSIS — M79671 Pain in right foot: Secondary | ICD-10-CM | POA: Diagnosis not present

## 2022-06-15 NOTE — Therapy (Signed)
OUTPATIENT PHYSICAL THERAPY NEURO TREATMENT   Patient Name: Justin [name] MRN: [id number] DOB:[birth year], 82 y.o., male Today's Date: 06/15/2022  PCP: Sherrie Mustache  REFERRING PROVIDER: [name], NP  END OF SESSION:  PT End of Session - 06/15/22 1400     Visit Number 14    Number of Visits 17    Date for PT Re-Evaluation 06/24/22    PT Start Time 1400                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   Diaphragmatic hernia without mention of obstruction or gangrene 1982   Hiatal hernia   Diplopia 2006   Disturbance of skin sensation 2003   Diverticulosis of colon (without mention of hemorrhage) 1997   Dizziness and giddiness 2001   Dysphagia, unspecified(787.20) 1999 and   Esophagitis, unspecified 1997   GERD (gastroesophageal reflux disease) 2003   Hypertension 2003   Impotence of organic origin 1999   Lack of coordination September 07, 2007   Lumbago 2003   Migraine with aura, without mention of intractable migraine without mention of status migrainosus 1997   Myalgia and myositis, unspecified 1999   Other and unspecified hyperlipidemia 2003   Other disorders of vitreous 2003   Other malaise and fatigue 2003   Other seborrheic keratosis 2013   Other seborrheic keratosis 04/13/09   Pain in joint, site unspecified 04/13/12   Personal history of fall 04/15/11   Restless legs syndrome (RLS) 1997   Spinocerebellar disease, 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 Donne Hazel   C3-4 anterior  cervical surgery  1999   Dr. Hal Neer   CARDIAC CATHETERIZATION  2001   Dr. Glade Lloyd   COLONOSCOPY  07/20/07   Dr. 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   Internal hemorrhoids without complication 67/34/1937   Arthritis 02/21/2014   Dysarthria 02/21/2014   Seborrheic keratosis 02/21/2014   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   Tinnitus 04/12/2013   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[place]) 10/16/2012   Impotence, 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   Essential hypertension    GERD (gastroesophageal reflux disease)    Dyslipidemia    Lumbago    Dysphagia     ONSET DATE: 04/17/2022  REFERRING DIAG: G11.9 (ICD-10-CM) - 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[place]) R26.89 (ICD-10-CM) - Imbalance  THERAPY DIAG:  Difficulty in walking, not elsewhere classified  Ataxic gait  Other abnormalities of gait and mobility  Unsteadiness on feet  Rationale for Evaluation and Treatment: Rehabilitation  SUBJECTIVE:  SUBJECTIVE STATEMENT: Patient reports he has been brushing his teeth standing as recommended by therapist. Patient states he has been walking around couch for UE support at home.  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 No  PRECAUTIONS: Fall  WEIGHT BEARING RESTRICTIONS: No  FALLS: Has patient fallen in last 6 months? No; FOF (+), not hesitant to leave the house    OBJECTIVE:  Functional test: 5x STS 05/26/22: 1 min 14 sec 5x STS 06/11/22: 32 sec no UE support  TODAY'S TREATMENT:     [facility] Adult PT Treatment:                                                DATE: 06/15/2022 Therapeutic Activity: NuStep L7 x6 min Fwd amb CGA FWW lobby -->NuStep CGA Fwd amb FWW 10#AW on frame x 80' Prone press up to elbows 10x3" Prone --> sitting, sitting <-->supine transfers (SBA + VC) Fwd butt kickers FWW 5#AW on frame STS (one hand on thigh, one hand on counter) + standing back extension x6 Standing butt kickers Standing front body stretch: hands on cabinets, looking up 3x10"    [facility] Adult PT Treatment:                                                DATE: 06/11/2022 Therapeutic Exercise: Nustep L6-L7 x 6 min, UEs/LEs Sit<>stand x5 Standing at counter Hands on cabinets + glute sets  Backwards step tap x10 Side stepping with wash rag x10 Sitting hip flexor stretch 2x30 sec Neuromuscular re-ed: Static balance feet apart 3x30 sec Hands on cabinets, alternating UE raises x10 Hands on cabinets, bilat UE raises 2x10  PATIENT EDUCATION: Education details:Walking more & being more active throughout day  Person educated: Patient and Caregiver   Education method: Explanation Education comprehension: verbalized understanding  HOME EXERCISE PROGRAM: Access Code: [email or web address] URL: [email or web address] Date: 06/05/2022 Prepared by: Helane Gunther  Exercises - Standing Tandem Balance with Counter Support  - 1 x daily - 4-5 x weekly - 1 sets - 2 reps - 30 seconds hold - Seated March  - 1 x daily - 4-5 x weekly - 2 sets - 20 reps - Sit to Stand with Counter Support  - 1 x daily - 7 x weekly - 3 sets - 10 reps - Seated Heel Toe Raises  - 1 x daily -  7 x weekly - 2 sets - 10 reps - Standing Hip Extension Kicks  - 1 x daily - 7 x weekly - 3 sets - 10 reps - Standing Hip Abduction Kicks  - 1 x daily - 7 x weekly - 3 sets - 10 reps - Standing Hip Flexion with Resistance Loop  - 1 x daily - 7 x weekly - 3 sets - 10 reps - Standing Lumbar Extension with Counter  - 1 x daily - 7 x weekly - 3 sets - 10 reps - Seated March  - 1 x daily - 7 x weekly - 3 sets - 10 reps  GOALS: Goals reviewed with patient? Yes  SHORT  TERM GOALS: Target date: 05/27/2022    Will be compliant with appropriate HEP with no more than MinA from caregiver  Baseline: Goal status: MET   2.  Will be able to verbalize at least 3 ways to reduce fall risk at home and in the community  Baseline:  Goal status: MET   3.  Will be able to complete 5x sit to stand in 20 seconds or less with no posterior LOB to show improved functional mobility  Baseline: 1 min 15 sec 05/26/22 Goal status: IN PROGRESS 12/28:IN PROGRESS  4.  Will be able to ambulate at least 122f with no rest breaks and LRAD  Baseline: 2x75' with one seated rest break using RW 05/26/22 Goal status: IN PROGRESS 12/28: PARTIALLY MET   LONG TERM GOALS: Target date: 06/24/2022    Will be able to complete TUG in 40 seconds or less with LRAD and no more than Min guard to show improved balance/mobility  Baseline:  Goal status: INITIAL  2.  Pt will increase BERG balance score to >/=30/56 to demonstrate improved static balance. Baseline: 19/56 (12/4) Goal status: INITIAL  3.  Will be able to navigate single step with LRAD and no more than Min guard for safety  Baseline:  Goal status: INITIAL  4.  Pain in R ankle to be no more than 5/10 at worst  Baseline:  Goal status: INITIAL  5.  Will be able to ambulate at least 2253fwith LRAD, no rest breaks, and Min guard to show improved functional mobility  Baseline:  Goal status: INITIAL    ASSESSMENT:  CLINICAL IMPRESSION: Ambulation with weight FWW  frame performed to promote improved postural stability; patient progressed from CGA to SBA during ambulation. Prone press ups incorporated to promote extension mobility and postural strength. Verbal cueing provided to improve body mechanics and increase independence with bed mobility.   OBJECTIVE IMPAIRMENTS: Abnormal gait, decreased activity tolerance, decreased balance, decreased coordination, decreased knowledge of use of DME, decreased mobility, difficulty walking, decreased strength, and pain.   ACTIVITY LIMITATIONS: standing, stairs, transfers, bed mobility, and locomotion level  PARTICIPATION LIMITATIONS: community activity, yard work, and church  PERSONAL FACTORS: Age, Behavior pattern, Fitness, Past/current experiences, Time since onset of injury/illness/exacerbation, and progressive chronic condition  are also affecting patient's functional outcome.   REHAB POTENTIAL: Good  CLINICAL DECISION MAKING: Evolving/moderate complexity  EVALUATION COMPLEXITY: Moderate  PLAN:  PT FREQUENCY: 2x/week  PT DURATION: 8 weeks  PLANNED INTERVENTIONS: Therapeutic exercises, Therapeutic activity, Neuromuscular re-education, Balance training, Gait training, Patient/Family education, Self Care, Joint mobilization, Stair training, DME instructions, Aquatic Therapy, Wheelchair mobility training, Biofeedback, Ionotophoresis '4mg'$ /ml Dexamethasone, Manual therapy, and Re-evaluation  PLAN FOR NEXT SESSION: Dynamic balance activities; progress amb in all directions & endurance; continues amb with weighted FWW  KaHardin NegusPTA 06/15/2022 2:01 PM

## 2022-06-16 ENCOUNTER — Ambulatory Visit: Payer: Medicare Other | Admitting: Physical Therapy

## 2022-06-18 ENCOUNTER — Ambulatory Visit: Payer: Medicare Other | Admitting: Physical Therapy

## 2022-06-19 ENCOUNTER — Ambulatory Visit: Payer: Medicare Other

## 2022-06-19 DIAGNOSIS — M79671 Pain in right foot: Secondary | ICD-10-CM | POA: Diagnosis not present

## 2022-06-19 DIAGNOSIS — R2689 Other abnormalities of gait and mobility: Secondary | ICD-10-CM | POA: Diagnosis not present

## 2022-06-19 DIAGNOSIS — R260 Ataxic gait: Secondary | ICD-10-CM

## 2022-06-19 DIAGNOSIS — R2681 Unsteadiness on feet: Secondary | ICD-10-CM | POA: Diagnosis not present

## 2022-06-19 DIAGNOSIS — R262 Difficulty in walking, not elsewhere classified: Secondary | ICD-10-CM

## 2022-06-19 NOTE — Therapy (Signed)
OUTPATIENT PHYSICAL THERAPY NEURO TREATMENT   Patient Name: Justin [name] MRN: [id number] DOB:23-Sep-1940, 82 y.o., male Today's Date: 06/19/2022  PCP: Sherrie Mustache  REFERRING PROVIDER: [name], NP  END OF SESSION:  PT End of Session - 06/19/22 0930     Visit Number 15    Number of Visits 17    Date for PT Re-Evaluation 06/24/22    PT Start Time 0930    PT Stop Time 1016    PT Time Calculation (min) 46 min                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   Diaphragmatic hernia without mention of obstruction or gangrene 1982   Hiatal hernia   Diplopia 2006   Disturbance of skin sensation 2003   Diverticulosis of colon (without mention of hemorrhage) 1997   Dizziness and giddiness 2001   Dysphagia, unspecified(787.20) 1999 and   Esophagitis, unspecified 1997   GERD (gastroesophageal reflux disease) 2003   Hypertension 2003   Impotence of organic origin 1999   Lack of coordination September 07, 2007   Lumbago 2003   Migraine with aura, without mention of intractable migraine without mention of status migrainosus 1997   Myalgia and myositis, unspecified 1999   Other and unspecified hyperlipidemia 2003   Other disorders of vitreous 2003   Other malaise and fatigue 2003   Other seborrheic keratosis 2013   Other seborrheic keratosis 04/13/09   Pain in joint, site unspecified 04/13/12   Personal history of fall 04/15/11   Restless legs syndrome (RLS) 1997   Spinocerebellar disease, 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 Donne Hazel   C3-4 anterior cervical surgery  1999   Dr. Hal Neer   CARDIAC CATHETERIZATION  2001   Dr. Glade Lloyd   COLONOSCOPY  07/20/07   Dr. 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   Internal hemorrhoids without complication 82/50/5397   Arthritis 02/21/2014   Dysarthria 02/21/2014   Seborrheic keratosis 02/21/2014   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   Tinnitus 04/12/2013   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[place]) 10/16/2012   Impotence, 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   Essential hypertension    GERD (gastroesophageal reflux disease)    Dyslipidemia    Lumbago    Dysphagia     ONSET DATE: 04/17/2022  REFERRING DIAG: G11.9 (ICD-10-CM) - 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[place]) R26.89 (ICD-10-CM) - Imbalance  THERAPY DIAG:  Difficulty in walking, not elsewhere classified  Ataxic gait  Other abnormalities of gait and  mobility  Unsteadiness on feet  Rationale for Evaluation and Treatment: Rehabilitation  SUBJECTIVE:                                                                                                                                                                                             SUBJECTIVE STATEMENT: Patient reports he did a lot of walking yesterday while out in community with RW and caregiver. Patient states his low back continues to feel sore.  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 No  PRECAUTIONS: Fall  WEIGHT BEARING RESTRICTIONS: No  FALLS: Has patient fallen in last 6 months? No; FOF (+), not hesitant to leave the house    OBJECTIVE:  Functional test: 5x STS 05/26/22: 1 min 14 sec 5x STS 06/11/22: 32 sec no UE support  TODAY'S TREATMENT:    [facility] Adult PT Treatment:                                                DATE: 06/19/2022 Therapeutic Exercise: NuStep alt L5/L8 every 19mn x 684m STS 2x5 (timed trials) Seated: Thoracic extension/pec stretch with pool noodle behind back Hip flexor/quad stretch Gait: Fwd amb RW SBA 2L (160')   [facility] Adult PT Treatment:                                                DATE: 06/15/2022 Therapeutic Activity: NuStep L7 x6 min Fwd amb CGA FWW lobby -->NuStep CGA Fwd amb FWW 10#AW on frame x 80' Prone press up to elbows 10x3" Prone --> sitting, sitting <-->supine transfers (SBA + VC) Fwd butt kickers FWW 5#AW on frame STS (one hand on thigh, one hand on counter) + standing back extension x6 Standing butt kickers Standing front body stretch: hands on cabinets, looking up 3x10"   PATIENT EDUCATION: Education details:Walking more & being more active throughout day  Person educated: Patient and Caregiver   Education method: Explanation Education comprehension: verbalized understanding  HOME EXERCISE PROGRAM: Access Code: [email or web address]: [email or web address] Date: 06/05/2022 Prepared by: KaHelane GuntherExercises - Standing Tandem Balance with Counter Support  - 1 x daily - 4-5 x weekly - 1 sets - 2 reps -  30 seconds hold - Seated March  - 1 x daily - 4-5 x weekly - 2 sets - 20 reps - Sit to Stand with Counter Support  - 1 x daily - 7 x weekly - 3 sets - 10 reps - Seated Heel Toe Raises  - 1 x daily - 7 x weekly - 2 sets - 10 reps - Standing Hip Extension Kicks  - 1 x daily - 7 x weekly - 3 sets - 10  reps - Standing Hip Abduction Kicks  - 1 x daily - 7 x weekly - 3 sets - 10 reps - Standing Hip Flexion with Resistance Loop  - 1 x daily - 7 x weekly - 3 sets - 10 reps - Standing Lumbar Extension with Counter  - 1 x daily - 7 x weekly - 3 sets - 10 reps - Seated March  - 1 x daily - 7 x weekly - 3 sets - 10 reps  GOALS: Goals reviewed with patient? Yes  SHORT TERM GOALS: Target date: 05/27/2022    Will be compliant with appropriate HEP with no more than MinA from caregiver  Baseline: Goal status: MET   2.  Will be able to verbalize at least 3 ways to reduce fall risk at home and in the community  Baseline:  Goal status: MET   3.  Will be able to complete 5x sit to stand in 20 seconds or less with no posterior LOB to show improved functional mobility  Baseline: 1 min 15 sec 05/26/22 Goal status: IN PROGRESS 12/28:IN PROGRESS 06/19/2022: PROGRESSING (61mn 4 sec)  4.  Will be able to ambulate at least 1576fwith no rest breaks and LRAD  Baseline: 2x75' with one seated rest break using RW 05/26/22 Goal status: IN PROGRESS 12/28: PARTIALLY MET 06/19/2022: MET (160' RW)   LONG TERM GOALS: Target date: 06/24/2022    Will be able to complete TUG in 40 seconds or less with LRAD and no more than Min guard to show improved balance/mobility  Baseline:  Goal status: INITIAL  2.  Pt will increase BERG balance score to >/=30/56 to demonstrate improved static balance. Baseline: 19/56 (12/4) Goal status: INITIAL  3.  Will be able to navigate single step with LRAD and no more than Min guard for safety  Baseline:  Goal status: INITIAL  4.  Pain in R ankle to be no more than 5/10 at worst  Baseline:  Goal status: INITIAL  5.  Will be able to ambulate at least 22559fith LRAD, no rest breaks, and Min guard to show improved functional mobility  Baseline:  Goal status: INITIAL    ASSESSMENT:  CLINICAL IMPRESSION:  Interval resistance training incorporated during NuStep warm-up  to progress cardiovascular endurance. Patient able to ambulate 160' with RW and required no rest breaks; mild fatigue exhibited towards end with increased forward flexed trunk posture. Seated postural stretches with focus on thoracic extension performed to improve upright standing posture.  OBJECTIVE IMPAIRMENTS: Abnormal gait, decreased activity tolerance, decreased balance, decreased coordination, decreased knowledge of use of DME, decreased mobility, difficulty walking, decreased strength, and pain.   ACTIVITY LIMITATIONS: standing, stairs, transfers, bed mobility, and locomotion level  PARTICIPATION LIMITATIONS: community activity, yard work, and church  PERSONAL FACTORS: Age, Behavior pattern, Fitness, Past/current experiences, Time since onset of injury/illness/exacerbation, and progressive chronic condition  are also affecting patient's functional outcome.   REHAB POTENTIAL: Good  CLINICAL DECISION MAKING: Evolving/moderate complexity  EVALUATION COMPLEXITY: Moderate  PLAN:  PT  FREQUENCY: 2x/week  PT DURATION: 8 weeks  PLANNED INTERVENTIONS: Therapeutic exercises, Therapeutic activity, Neuromuscular re-education, Balance training, Gait training, Patient/Family education, Self Care, Joint mobilization, Stair training, DME instructions, Aquatic Therapy, Wheelchair mobility training, Biofeedback, Ionotophoresis '4mg'$ /ml Dexamethasone, Manual therapy, and Re-evaluation  PLAN FOR NEXT SESSION: Dynamic balance activities; progress amb in all directions & endurance; continues amb with weighted FWW  Hardin Negus, PTA 06/19/2022 10:23 AM

## 2022-06-22 ENCOUNTER — Ambulatory Visit: Payer: Medicare Other

## 2022-06-22 DIAGNOSIS — R262 Difficulty in walking, not elsewhere classified: Secondary | ICD-10-CM

## 2022-06-22 DIAGNOSIS — R2689 Other abnormalities of gait and mobility: Secondary | ICD-10-CM | POA: Diagnosis not present

## 2022-06-22 DIAGNOSIS — R2681 Unsteadiness on feet: Secondary | ICD-10-CM

## 2022-06-22 DIAGNOSIS — R260 Ataxic gait: Secondary | ICD-10-CM | POA: Diagnosis not present

## 2022-06-22 DIAGNOSIS — M79671 Pain in right foot: Secondary | ICD-10-CM | POA: Diagnosis not present

## 2022-06-22 NOTE — Therapy (Signed)
OUTPATIENT PHYSICAL THERAPY NEURO TREATMENT   Patient Name: Justin [name] MRN: [id number] DOB:03-18-1941, 82 y.o., male Today's Date: 06/22/2022  PCP: Sherrie Mustache  REFERRING PROVIDER: [name], NP  END OF SESSION:  PT End of Session - 06/22/22 1402     Visit Number 16    Number of Visits 17    Date for PT Re-Evaluation 06/24/22    Authorization Type [employer] MCR    Authorization Time Period 04/29/22 to 06/24/22    PT Start Time 1402    PT Stop Time 1450    PT Time Calculation (min) 48 min    Activity Tolerance Patient tolerated treatment well    Behavior During Therapy [facility] for tasks assessed/performed                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   Diaphragmatic hernia without mention of obstruction or gangrene 1982   Hiatal hernia   Diplopia 2006   Disturbance of skin sensation 2003   Diverticulosis of colon (without mention of hemorrhage) 1997   Dizziness and giddiness 2001   Dysphagia, unspecified(787.20) 1999 and   Esophagitis, unspecified 1997   GERD (gastroesophageal reflux disease) 2003   Hypertension 2003   Impotence of organic origin 1999   Lack of coordination September 07, 2007   Lumbago 2003   Migraine with aura, without mention of intractable migraine without mention of status migrainosus 1997   Myalgia and myositis, unspecified 1999   Other and unspecified hyperlipidemia 2003   Other disorders of vitreous 2003   Other malaise and fatigue 2003   Other seborrheic keratosis 2013   Other seborrheic keratosis 04/13/09   Pain in joint, site unspecified 04/13/12   Personal history of fall 04/15/11   Restless legs syndrome (RLS)  1997   Spinocerebellar disease, 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 Donne Hazel   C3-4 anterior cervical surgery  1999   Dr. Hal Neer   CARDIAC CATHETERIZATION  2001   Dr. Glade Lloyd   COLONOSCOPY  07/20/07   Dr. 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   Internal hemorrhoids without complication 40/98/1191   Arthritis 02/21/2014   Dysarthria 02/21/2014   Seborrheic keratosis 02/21/2014   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   Tinnitus 04/12/2013   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[place]) 10/16/2012   Impotence, 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   Essential hypertension    GERD (gastroesophageal reflux disease)    Dyslipidemia    Lumbago    Dysphagia  ONSET DATE: 04/17/2022  REFERRING DIAG: G11.9 (ICD-10-CM) - 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[place]) R26.89 (ICD-10-CM) - Imbalance  THERAPY DIAG:  Difficulty in walking, not elsewhere classified  Ataxic gait  Other abnormalities of gait and mobility  Unsteadiness on feet  Rationale for Evaluation and Treatment: Rehabilitation  SUBJECTIVE:                                                                                                                                                                                             SUBJECTIVE STATEMENT: Patient reports he is walking more around the house; caretaker states  they have been working on standing at counter with overhead reaching.  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 No  PRECAUTIONS: Fall  WEIGHT BEARING RESTRICTIONS: No  FALLS: Has patient fallen in last 6 months? No; FOF (+), not hesitant to leave the house    OBJECTIVE:  Functional test: 5x STS 05/26/22: 1 min 14 sec 5x STS 06/11/22: 32 sec no UE support  TODAY'S TREATMENT:    .[facility] Adult PT Treatment:                                                DATE: 06/22/2022 Therapeutic Exercise: NuStep L6 (UE 6/LE) x 48mn STS + alt overhead reaching x5  Seated thoracic ext/ dynamic pec stretch (pool noodle behind back) x10 Seated alt overhead arm reaching --> bilateral bkwd arm circles Therapeutic Activity: Fwd amb + 4#Aws on RW TUG trials: 1x4#Aws on frame (untimed), no weighted RW: 1x 133m 56 sec, 1x 2 min 24 sec Standing balance EO (arms crossed at chest) x30" --> EC 3x15"  RW: alt toe taps on airex   [facility] Adult PT Treatment:                                                DATE: 06/19/2022 Therapeutic Exercise: NuStep alt L5/L8 every 33m65mx 6mi74mTS 2x5 (timed trials) Seated: Thoracic extension/pec stretch with pool noodle behind back Hip flexor/quad stretch Gait: Fwd amb RW SBA 2L (160')   PATIENT EDUCATION: Education details:Walking more & being more active throughout day  Person educated: Patient and Caregiver   Education method: Explanation Education comprehension:  verbalized understanding  HOME EXERCISE PROGRAM: Access Code: [email or web address] URL: [email or web address] Date: 06/05/2022 Prepared by: Helane Gunther  Exercises - Standing Tandem Balance with Counter Support  - 1 x daily - 4-5 x weekly - 1 sets - 2 reps - 30 seconds hold - Seated March  - 1 x daily - 4-5 x weekly - 2 sets - 20  reps - Sit to Stand with Counter Support  - 1 x daily - 7 x weekly - 3 sets - 10 reps - Seated Heel Toe Raises  - 1 x daily - 7 x weekly - 2 sets - 10 reps - Standing Hip Extension Kicks  - 1 x daily - 7 x weekly - 3 sets - 10 reps - Standing Hip Abduction Kicks  - 1 x daily - 7 x weekly - 3 sets - 10 reps - Standing Hip Flexion with Resistance Loop  - 1 x daily - 7 x weekly - 3 sets - 10 reps - Standing Lumbar Extension with Counter  - 1 x daily - 7 x weekly - 3 sets - 10 reps - Seated March  - 1 x daily - 7 x weekly - 3 sets - 10 reps  GOALS: Goals reviewed with patient? Yes  SHORT TERM GOALS: Target date: 05/27/2022    Will be compliant with appropriate HEP with no more than MinA from caregiver  Baseline: Goal status: MET   2.  Will be able to verbalize at least 3 ways to reduce fall risk at home and in the community  Baseline:  Goal status: MET   3.  Will be able to complete 5x sit to stand in 20 seconds or less with no posterior LOB to show improved functional mobility  Baseline: 1 min 15 sec 05/26/22 Goal status: IN PROGRESS 12/28:IN PROGRESS 06/19/2022: PROGRESSING (28mn 4 sec)  4.  Will be able to ambulate at least 1557fwith no rest breaks and LRAD  Baseline: 2x75' with one seated rest break using RW 05/26/22 Goal status: IN PROGRESS 12/28: PARTIALLY MET 06/19/2022: MET (160' RW)   LONG TERM GOALS: Target date: 06/24/2022    Will be able to complete TUG in 40 seconds or less with LRAD and no more than Min guard to show improved balance/mobility  Baseline:  Goal status: INITIAL  2.  Pt will increase BERG balance score to >/=30/56 to demonstrate improved static balance. Baseline: 19/56 (12/4) Goal status: INITIAL  3.  Will be able to navigate single step with LRAD and no more than Min guard for safety  Baseline:  Goal status: INITIAL  4.  Pain in R ankle to be no more than 5/10 at worst  Baseline:  Goal status: INITIAL  5.  Will be able to ambulate at  least 22554fith LRAD, no rest breaks, and Min guard to show improved functional mobility  Baseline:  Goal status: INITIAL    ASSESSMENT:  CLINICAL IMPRESSION:  Interval resistance training incorporated during NuStep warm-up to progress cardiovascular endurance. Patient able to ambulate 160' with RW and required no rest breaks; mild fatigue exhibited towards end with increased forward flexed trunk posture. Seated postural stretches with focus on thoracic extension performed to improve upright standing posture.  OBJECTIVE IMPAIRMENTS: Abnormal gait, decreased activity tolerance, decreased balance, decreased coordination, decreased knowledge of use of DME, decreased mobility, difficulty walking, decreased strength, and pain.   ACTIVITY LIMITATIONS: standing, stairs, transfers, bed mobility, and locomotion level  PARTICIPATION LIMITATIONS: community activity, yard work, and church  PERSONAL  FACTORS: Age, Behavior pattern, Fitness, Past/current experiences, Time since onset of injury/illness/exacerbation, and progressive chronic condition  are also affecting patient's functional outcome.   REHAB POTENTIAL: Good  CLINICAL DECISION MAKING: Evolving/moderate complexity  EVALUATION COMPLEXITY: Moderate  PLAN:  PT FREQUENCY: 2x/week  PT DURATION: 8 weeks  PLANNED INTERVENTIONS: Therapeutic exercises, Therapeutic activity, Neuromuscular re-education, Balance training, Gait training, Patient/Family education, Self Care, Joint mobilization, Stair training, DME instructions, Aquatic Therapy, Wheelchair mobility training, Biofeedback, Ionotophoresis '4mg'$ /ml Dexamethasone, Manual therapy, and Re-evaluation  PLAN FOR NEXT SESSION: Recertification  Hardin Negus, PTA 06/22/2022 2:54 PM

## 2022-06-23 ENCOUNTER — Ambulatory Visit: Payer: Medicare Other | Admitting: Physical Therapy

## 2022-06-24 ENCOUNTER — Ambulatory Visit: Payer: Medicare Other | Admitting: Physical Therapy

## 2022-06-24 ENCOUNTER — Encounter: Payer: Self-pay | Admitting: Physical Therapy

## 2022-06-24 DIAGNOSIS — R262 Difficulty in walking, not elsewhere classified: Secondary | ICD-10-CM

## 2022-06-24 DIAGNOSIS — R2681 Unsteadiness on feet: Secondary | ICD-10-CM

## 2022-06-24 DIAGNOSIS — R260 Ataxic gait: Secondary | ICD-10-CM | POA: Diagnosis not present

## 2022-06-24 DIAGNOSIS — R2689 Other abnormalities of gait and mobility: Secondary | ICD-10-CM | POA: Diagnosis not present

## 2022-06-24 DIAGNOSIS — M79671 Pain in right foot: Secondary | ICD-10-CM | POA: Diagnosis not present

## 2022-06-24 NOTE — Therapy (Signed)
OUTPATIENT PHYSICAL THERAPY NEURO TREATMENT AND RECERTIFICATION   Patient Name: Justin [name] MRN: [id number] DOB:[birth year], 82 y.o., male Today's Date: 06/24/2022  PCP: Sherrie Mustache  REFERRING PROVIDER: [name], NP  END OF SESSION:  PT End of Session - 06/24/22 1528     Visit Number 17    Number of Visits 29    Date for PT Re-Evaluation 08/05/22    Authorization - Number of Visits 17    Progress Note Due on Visit 60    PT Start Time 1448    PT Stop Time 1529    PT Time Calculation (min) 41 min    Activity Tolerance Patient tolerated treatment well    Behavior During Therapy [facility] for tasks assessed/performed                 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   Diaphragmatic hernia without mention of obstruction or gangrene 1982   Hiatal hernia   Diplopia 2006   Disturbance of skin sensation 2003   Diverticulosis of colon (without mention of hemorrhage) 1997   Dizziness and giddiness 2001   Dysphagia, unspecified(787.20) 1999 and   Esophagitis, unspecified 1997   GERD (gastroesophageal reflux disease) 2003   Hypertension 2003   Impotence of organic origin 1999   Lack of coordination September 07, 2007   Lumbago 2003   Migraine with aura, without mention of intractable migraine without mention of status migrainosus 1997   Myalgia and myositis, unspecified 1999   Other and unspecified hyperlipidemia 2003   Other disorders of vitreous 2003   Other malaise and fatigue 2003   Other seborrheic keratosis 2013   Other seborrheic keratosis 04/13/09   Pain in joint, site unspecified 04/13/12   Personal history of fall 04/15/11   Restless legs  syndrome (RLS) 1997   Spinocerebellar disease, 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 Donne Hazel   C3-4 anterior cervical surgery  1999   Dr. Hal Neer   CARDIAC CATHETERIZATION  2001   Dr. Glade Lloyd   COLONOSCOPY  07/20/07   Dr. 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[place]) 12/21/2016   Urgency incontinence 04/01/2016   History of fall 08/21/2015   Fungal dermatosis 02/20/2015   SCC (squamous cell carcinoma), 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   Internal hemorrhoids without complication 75/03/2584   Arthritis 02/21/2014   Dysarthria 02/21/2014   Seborrheic keratosis 02/21/2014   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   Tinnitus 04/12/2013   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[place]) 10/16/2012   Impotence, 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   Essential hypertension    GERD (gastroesophageal reflux disease)    Dyslipidemia    Lumbago  Dysphagia     ONSET DATE: 04/17/2022  REFERRING DIAG: G11.9 (ICD-10-CM) - 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[place]) R26.89 (ICD-10-CM) - Imbalance  THERAPY DIAG:  Difficulty in walking, not elsewhere classified  Ataxic gait  Other abnormalities of gait and mobility  Unsteadiness on feet  Rationale for Evaluation and Treatment: Rehabilitation  SUBJECTIVE:                                                                                                                                                                                             SUBJECTIVE STATEMENT: Patient's caregiver reports he is working on standing  tall and has been working on gait at home  Pt accompanied by:  caregiver  [name]  PERTINENT HISTORY:  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 No  PRECAUTIONS: Fall  WEIGHT BEARING RESTRICTIONS: No  FALLS: Has patient fallen in last 6 months? No; FOF (+), not hesitant to leave the house    OBJECTIVE:  Functional test: 5x STS 05/26/22: 1 min 14 sec 5x STS 06/11/22: 32 sec no UE support  TUG (1/24): 1:27 BERG BALANCE TEST Sitting to Standing: 1.      Min A to stand Standing Unsupported: 0.      Unable to stand unsupported for 30 seconds Sitting Unsupported: 4.     Sits for 2 minutes independently Standing to Sitting: 1.     Sits independently, but uncontrolled descent  Transfers: 1.     Needs 1 person assist Standing with eyes closed: 2.     Able to stand for 3 seconds Standing with feet together: 0.     Needs help to attain position and unable to hold for 15 seconds Reaching forward with outstretched arm: 0.     Loses balance/requires assistace Retrieving object from the floor: 1.     Unable to pick up and needs supervision Turning to look behind: 0.     Needs assistance Turning 360 degrees: 0.     Needs assistance Place alternate foot on stool: 1.     Completes >2 steps with minimal assist Standing with one foot in front: 0.     Loses balance while standing/stepping Standing on one foot: 0.     Unable  Total Score: 11/56   TODAY'S TREATMENT:    [facility] Adult PT Treatment:  DATE: 06/24/22 Therapeutic Exercise: Nustep L6 x 5 min warm up Neuromuscular re-ed: TUG 1 min 27 seconds Berg 11/56 Stair negotiation 6'' steps with bilat handrails and CGA Standing reaching all directions for trunk extension and rotation and improved balance   [facility] Adult PT Treatment:                                                DATE:  06/22/2022 Therapeutic Exercise: NuStep L6 (UE 6/LE) x 69mn STS + alt overhead reaching x5  Seated thoracic ext/ dynamic pec stretch (pool noodle behind back) x10 Seated alt overhead arm reaching --> bilateral bkwd arm circles Therapeutic Activity: Fwd amb + 4#Aws on RW TUG trials: 1x4#Aws on frame (untimed), no weighted RW: 1x 143m 56 sec, 1x 2 min 24 sec Standing balance EO (arms crossed at chest) x30" --> EC 3x15"  RW: alt toe taps on airex   [facility] Adult PT Treatment:                                                DATE: 06/19/2022 Therapeutic Exercise: NuStep alt L5/L8 every 27m23mx 6mi66mTS 2x5 (timed trials) Seated: Thoracic extension/pec stretch with pool noodle behind back Hip flexor/quad stretch Gait: Fwd amb RW SBA 2L (160')   PATIENT EDUCATION: Education details:Walking more & being more active throughout day  Person educated: Patient and Caregiver   Education method: Explanation Education comprehension: verbalized understanding  HOME EXERCISE PROGRAM: Access Code: [email or web address]: [email or web address] Date: 06/05/2022 Prepared by: KathHelane Guntherercises - Standing Tandem Balance with Counter Support  - 1 x daily - 4-5 x weekly - 1 sets - 2 reps - 30 seconds hold - Seated March  - 1 x daily - 4-5 x weekly - 2 sets - 20 reps - Sit to Stand with Counter Support  - 1 x daily - 7 x weekly - 3 sets - 10 reps - Seated Heel Toe Raises  - 1 x daily - 7 x weekly - 2 sets - 10 reps - Standing Hip Extension Kicks  - 1 x daily - 7 x weekly - 3 sets - 10 reps - Standing Hip Abduction Kicks  - 1 x daily - 7 x weekly - 3 sets - 10 reps - Standing Hip Flexion with Resistance Loop  - 1 x daily - 7 x weekly - 3 sets - 10 reps - Standing Lumbar Extension with Counter  - 1 x daily - 7 x weekly - 3 sets - 10 reps - Seated March  - 1 x daily - 7 x weekly - 3 sets - 10 reps  GOALS: Goals reviewed with patient? Yes  SHORT TERM GOALS: Target date: 05/27/2022    Will be  compliant with appropriate HEP with no more than MinA from caregiver  Baseline: Goal status: MET   2.  Will be able to verbalize at least 3 ways to reduce fall risk at home and in the community  Baseline:  Goal status: MET   3.  Will be able to complete 5x sit to stand in 20 seconds or less with no posterior LOB to show improved functional mobility  Baseline: 1 min 15 sec 05/26/22 Goal status: IN PROGRESS 12/28:IN  PROGRESS 06/19/2022: PROGRESSING (13mn 4 sec)  4.  Will be able to ambulate at least 1579fwith no rest breaks and LRAD  Baseline: 2x75' with one seated rest break using RW 05/26/22 Goal status: IN PROGRESS 12/28: PARTIALLY MET 06/19/2022: MET (160' RW)   LONG TERM GOALS: Target date: 08/05/2022      Will be able to complete TUG in 40 seconds or less with LRAD and no more than Min guard to show improved balance/mobility  Baseline:  Goal status: IN PROGRESS  2.  Pt will increase BERG balance score to >/=30/56 to demonstrate improved static balance. Baseline: 19/56 (12/4) Goal status: IN PROGRESS  3.  Will be able to navigate single step with LRAD and no more than Min guard for safety  Baseline:  Goal status: MET  4.  Pain in R ankle to be no more than 5/10 at worst  Baseline:  Goal status: MET  5.  Will be able to ambulate at least 22562fith LRAD, no rest breaks, and Min guard to show improved functional mobility  Baseline:  Goal status: MET    ASSESSMENT:  CLINICAL IMPRESSION:  Pt has improved gait distance and gait speed. He continues with decreased static and dynamic balance and decreased activity tolerance and endurance. Pt will continue to benefit from skilled PT to work towards remaining goals  OBJECTIVE IMPAIRMENTS: Abnormal gait, decreased activity tolerance, decreased balance, decreased coordination, decreased knowledge of use of DME, decreased mobility, difficulty walking, decreased strength, and pain.    PLAN:  PT FREQUENCY: 2x/week  PT  DURATION: 6 weeks  PLANNED INTERVENTIONS: Therapeutic exercises, Therapeutic activity, Neuromuscular re-education, Balance training, Gait training, Patient/Family education, Self Care, Joint mobilization, Stair training, DME instructions, Aquatic Therapy, Wheelchair mobility training, Biofeedback, Ionotophoresis '4mg'$ /ml Dexamethasone, Manual therapy, and Re-evaluation  PLAN FOR NEXT SESSION: gait, balance  Christle Nolting, PT 06/24/2022 3:29 PM

## 2022-06-25 ENCOUNTER — Ambulatory Visit: Payer: Medicare Other | Admitting: Physical Therapy

## 2022-06-29 ENCOUNTER — Other Ambulatory Visit: Payer: Self-pay | Admitting: Nurse Practitioner

## 2022-06-29 ENCOUNTER — Ambulatory Visit: Payer: Medicare Other

## 2022-06-29 DIAGNOSIS — R260 Ataxic gait: Secondary | ICD-10-CM

## 2022-06-29 DIAGNOSIS — R2681 Unsteadiness on feet: Secondary | ICD-10-CM

## 2022-06-29 DIAGNOSIS — R2689 Other abnormalities of gait and mobility: Secondary | ICD-10-CM

## 2022-06-29 DIAGNOSIS — M79671 Pain in right foot: Secondary | ICD-10-CM | POA: Diagnosis not present

## 2022-06-29 DIAGNOSIS — R262 Difficulty in walking, not elsewhere classified: Secondary | ICD-10-CM | POA: Diagnosis not present

## 2022-06-29 DIAGNOSIS — G119 Hereditary ataxia, unspecified: Secondary | ICD-10-CM

## 2022-06-29 NOTE — Therapy (Signed)
OUTPATIENT PHYSICAL THERAPY NEURO TREATMENT   Patient Name: Justin [name] MRN: [id number] DOB:[birth year], 82 y.o., male Today's Date: 06/29/2022  PCP: Sherrie Mustache  REFERRING PROVIDER: [name], NP  END OF SESSION:  PT End of Session - 06/29/22 1145     Visit Number 18    Number of Visits 29    Date for PT Re-Evaluation 08/05/22    Authorization Type BC[employer] MCR    Authorization Time Period --    Authorization - Number of Visits 18    Progress Note Due on Visit 20    PT Start Time 1150    PT Stop Time 1243    PT Time Calculation (min) 53 min                 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   Diaphragmatic hernia without mention of obstruction or gangrene 1982   Hiatal hernia   Diplopia 2006   Disturbance of skin sensation 2003   Diverticulosis of colon (without mention of hemorrhage) 1997   Dizziness and giddiness 2001   Dysphagia, unspecified(787.20) 1999 and   Esophagitis, unspecified 1997   GERD (gastroesophageal reflux disease) 2003   Hypertension 2003   Impotence of organic origin 1999   Lack of coordination September 07, 2007   Lumbago 2003   Migraine with aura, without mention of intractable migraine without mention of status migrainosus 1997   Myalgia and myositis, unspecified 1999   Other and unspecified hyperlipidemia 2003   Other disorders of vitreous 2003   Other malaise and fatigue 2003   Other seborrheic keratosis 2013   Other seborrheic keratosis 04/13/09   Pain in joint, site unspecified 04/13/12   Personal history of fall 04/15/11   Restless legs syndrome (RLS) 1997   Spinocerebellar disease, 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 Donne Hazel   C3-4 anterior cervical surgery  1999   Dr. Hal Neer   CARDIAC CATHETERIZATION  2001   Dr. Glade Lloyd   COLONOSCOPY  07/20/07   Dr. 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   Internal hemorrhoids without complication 35/57/3220   Arthritis 02/21/2014   Dysarthria 02/21/2014   Seborrheic keratosis 02/21/2014   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   Tinnitus 04/12/2013   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[place]) 10/16/2012   Impotence, 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   Essential hypertension    GERD (gastroesophageal reflux disease)    Dyslipidemia    Lumbago    Dysphagia     ONSET DATE:  04/17/2022  REFERRING DIAG: G11.9 (ICD-10-CM) - 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[place]) R26.89 (ICD-10-CM) - Imbalance  THERAPY DIAG:  Difficulty in walking, not elsewhere classified  Ataxic gait  Other abnormalities of gait and mobility  Unsteadiness on feet  Rationale for Evaluation and Treatment: Rehabilitation  SUBJECTIVE:                                                                                                                                                                                             SUBJECTIVE STATEMENT: Patient reports his back was sore Friday and Saturday due to doing a lot of walking.   Pt accompanied by:  caregiver   [name]  PERTINENT HISTORY:  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[place]) -progressive, continues with supportive care - Ambulatory referral to Physical Therapy      PAIN:  Are you having pain? No  PRECAUTIONS: Fall  WEIGHT BEARING RESTRICTIONS: No  FALLS: Has patient fallen in last 6 months? No; FOF (+), not hesitant to leave the house    OBJECTIVE:  Functional test: 5x STS 05/26/22: 1 min 14 sec 5x STS 06/11/22: 32 sec no UE support  TUG (1/24): 1:27 BERG BALANCE TEST Sitting to Standing: 1.      Min A to stand Standing Unsupported: 0.      Unable to stand unsupported for 30 seconds Sitting Unsupported: 4.     Sits for 2 minutes independently Standing to Sitting: 1.     Sits independently, but uncontrolled descent  Transfers: 1.     Needs 1 person assist Standing with eyes closed: 2.     Able to stand for 3 seconds Standing with feet together: 0.     Needs help to attain position and unable to hold for 15 seconds Reaching forward with outstretched arm: 0.     Loses balance/requires assistace Retrieving object from the floor: 1.     Unable to pick up and needs supervision Turning to look behind: 0.     Needs assistance Turning 360 degrees: 0.     Needs assistance Place alternate foot on stool: 1.     Completes >2 steps with minimal assist Standing with one foot in front: 0.     Loses balance while standing/stepping Standing on one foot: 0.     Unable  Total Score: 11/56   TODAY'S TREATMENT:    [facility] Adult PT Treatment:  DATE: 06/29/2022 Therapeutic Activity: Amb lobby <--> gym RW CGA Standing stretch at counter + alt overhead reaches Standing cross punches CGA Seated thoracic extension w/noodle Seated rows GTB LAQs 5# x15 B Fwd seated scooting (edge of mat table) LTRs (wedge + pillow) Supine bridges (ball b/w knees) Seated resisted trunk extension  NuStep for reciprocal gait pattern L5 x 68mn   [facility] Adult PT Treatment:                                                 DATE: 06/24/22 Therapeutic Exercise: Nustep L6 x 5 min warm up Neuromuscular re-ed: TUG 1 min 27 seconds Berg 11/56 Stair negotiation 6'' steps with bilat handrails and CGA Standing reaching all directions for trunk extension and rotation and improved balance   PATIENT EDUCATION: Education details: Forward scooting body mechanics EOB; Updated HEP Person educated: Patient and Caregiver   Education method: Explanation Education comprehension: verbalized understanding  HOME EXERCISE PROGRAM: Access Code: [email or web address]: [email or web address] Date: 06/29/2022 Prepared by: KHelane Gunther Exercises - Standing Tandem Balance with Counter Support  - 1 x daily - 4-5 x weekly - 1 sets - 2 reps - 30 seconds hold - Seated March  - 1 x daily - 4-5 x weekly - 2 sets - 20 reps - Sit to Stand with Counter Support  - 1 x daily - 7 x weekly - 3 sets - 10 reps - Seated Heel Toe Raises  - 1 x daily - 7 x weekly - 2 sets - 10 reps - Standing Hip Extension Kicks  - 1 x daily - 7 x weekly - 3 sets - 10 reps - Standing Hip Abduction Kicks  - 1 x daily - 7 x weekly - 3 sets - 10 reps - Standing Hip Flexion with Resistance Loop  - 1 x daily - 7 x weekly - 3 sets - 10 reps - Standing Lumbar Extension with Counter  - 1 x daily - 7 x weekly - 3 sets - 10 reps - Seated March  - 1 x daily - 7 x weekly - 3 sets - 10 reps - Seated Shoulder Row with Anchored Resistance  - 1 x daily - 7 x weekly - 3 sets - 10 reps - Lower Trunk Rotations  - 1 x daily - 7 x weekly - 3 sets - 10 reps - Supine Bridge with Mini Swiss Ball Between Knees  - 1 x daily - 7 x weekly - 3 sets - 10 reps   GOALS: Goals reviewed with patient? Yes  SHORT TERM GOALS: Target date: 05/27/2022    Will be compliant with appropriate HEP with no more than MinA from caregiver  Baseline: Goal status: MET   2.  Will be able to verbalize at least 3 ways to reduce fall risk at home and in the  community  Baseline:  Goal status: MET   3.  Will be able to complete 5x sit to stand in 20 seconds or less with no posterior LOB to show improved functional mobility  Baseline: 1 min 15 sec 05/26/22 Goal status: IN PROGRESS 12/28:IN PROGRESS 06/19/2022: PROGRESSING (186m 4 sec)  4.  Will be able to ambulate at least 15040fith no rest breaks and LRAD  Baseline: 2x75' with one seated rest break using RW 05/26/22 Goal status: IN PROGRESS 12/28: PARTIALLY MET 06/19/2022:  MET (160' RW)   LONG TERM GOALS: Target date: 08/05/2022      Will be able to complete TUG in 40 seconds or less with LRAD and no more than Min guard to show improved balance/mobility  Baseline:  Goal status: IN PROGRESS  2.  Pt will increase BERG balance score to >/=30/56 to demonstrate improved static balance. Baseline: 19/56 (12/4) Goal status: IN PROGRESS  3.  Will be able to navigate single step with LRAD and no more than Min guard for safety  Baseline:  Goal status: MET  4.  Pain in R ankle to be no more than 5/10 at worst  Baseline:  Goal status: MET  5.  Will be able to ambulate at least 278f with LRAD, no rest breaks, and Min guard to show improved functional mobility  Baseline:  Goal status: MET    ASSESSMENT:  CLINICAL IMPRESSION:  Treatment interventions focused on dynamic balance and postural strengthening; mild postural sway with CGA demonstrated during standing cross punches with light hand weights. Patient instructed in forward scooting on edge of table to progress body mechanics and independence with transfers. Postural strengthening exercises incorporated to improve upright standing posture and trunk strength.  OBJECTIVE IMPAIRMENTS: Abnormal gait, decreased activity tolerance, decreased balance, decreased coordination, decreased knowledge of use of DME, decreased mobility, difficulty walking, decreased strength, and pain.    PLAN:  PT FREQUENCY: 2x/week  PT DURATION: 6  weeks  PLANNED INTERVENTIONS: Therapeutic exercises, Therapeutic activity, Neuromuscular re-education, Balance training, Gait training, Patient/Family education, Self Care, Joint mobilization, Stair training, DME instructions, Aquatic Therapy, Wheelchair mobility training, Biofeedback, Ionotophoresis '4mg'$ /ml Dexamethasone, Manual therapy, and Re-evaluation  PLAN FOR NEXT SESSION: Postural strengthening and mobility; dynamic balance activities; continue gait and transfer training.  KHelane Gunther PTA 06/29/2022

## 2022-07-01 ENCOUNTER — Ambulatory Visit: Payer: Medicare Other

## 2022-07-01 DIAGNOSIS — R2689 Other abnormalities of gait and mobility: Secondary | ICD-10-CM

## 2022-07-01 DIAGNOSIS — R260 Ataxic gait: Secondary | ICD-10-CM

## 2022-07-01 DIAGNOSIS — R262 Difficulty in walking, not elsewhere classified: Secondary | ICD-10-CM | POA: Diagnosis not present

## 2022-07-01 DIAGNOSIS — M79671 Pain in right foot: Secondary | ICD-10-CM | POA: Diagnosis not present

## 2022-07-01 DIAGNOSIS — R2681 Unsteadiness on feet: Secondary | ICD-10-CM

## 2022-07-01 NOTE — Therapy (Signed)
OUTPATIENT PHYSICAL THERAPY NEURO TREATMENT   Patient Name: Justin [name] MRN: [id number] DOB:01-May-1941, 82 y.o., male Today's Date: 07/01/2022  PCP: Sherrie Mustache  REFERRING PROVIDER: [name], NP  END OF SESSION:  PT End of Session - 07/01/22 1401     Visit Number 19    Number of Visits 29    Date for PT Re-Evaluation 08/05/22    Authorization Type BCBS MCR    Progress Note Due on Visit 20    PT Start Time 1400    PT Stop Time 0160    PT Time Calculation (min) 45 min    Activity Tolerance Patient tolerated treatment well    Behavior During Therapy [facility] for tasks assessed/performed                 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   Diaphragmatic hernia without mention of obstruction or gangrene 1982   Hiatal hernia   Diplopia 2006   Disturbance of skin sensation 2003   Diverticulosis of colon (without mention of hemorrhage) 1997   Dizziness and giddiness 2001   Dysphagia, unspecified(787.20) 1999 and   Esophagitis, unspecified 1997   GERD (gastroesophageal reflux disease) 2003   Hypertension 2003   Impotence of organic origin 1999   Lack of coordination September 07, 2007   Lumbago 2003   Migraine with aura, without mention of intractable migraine without mention of status migrainosus 1997   Myalgia and myositis, unspecified 1999   Other and unspecified hyperlipidemia 2003   Other disorders of vitreous 2003   Other malaise and fatigue 2003   Other seborrheic keratosis 2013   Other seborrheic keratosis 04/13/09   Pain in joint, site unspecified 04/13/12   Personal history of fall 04/15/11   Restless legs syndrome (RLS) 1997    Spinocerebellar disease, 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 Donne Hazel   C3-4 anterior cervical surgery  1999   Dr. Hal Neer   CARDIAC CATHETERIZATION  2001   Dr. Glade Lloyd   COLONOSCOPY  07/20/07   Dr. 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   Internal hemorrhoids without complication 10/93/2355   Arthritis 02/21/2014   Dysarthria 02/21/2014   Seborrheic keratosis 02/21/2014   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   Tinnitus 04/12/2013   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[place]) 10/16/2012   Impotence, 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   Essential hypertension    GERD (gastroesophageal reflux disease)    Dyslipidemia    Lumbago    Dysphagia  ONSET DATE: 04/17/2022  REFERRING DIAG: G11.9 (ICD-10-CM) - 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  Patient states he has been working standing balance with overhead reaching at home.   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 No  PRECAUTIONS: Fall  WEIGHT BEARING RESTRICTIONS: No  FALLS: Has patient fallen in last 6 months? No; FOF (+), not hesitant to leave the house    OBJECTIVE:  Functional test: 5x STS 05/26/22: 1 min 14 sec 5x STS 06/11/22: 32 sec no UE support  TUG (1/24): 1:27 BERG BALANCE TEST Sitting to Standing: 1.      Min A to stand Standing Unsupported: 0.      Unable to stand unsupported for 30 seconds Sitting Unsupported: 4.     Sits for 2 minutes independently Standing to Sitting: 1.     Sits independently, but uncontrolled descent  Transfers: 1.     Needs 1 person assist Standing with eyes closed: 2.     Able to stand for 3 seconds Standing with feet together: 0.     Needs help to attain position and unable to hold for 15 seconds Reaching forward with outstretched arm: 0.     Loses balance/requires assistace Retrieving object from the floor: 1.     Unable to pick up and needs supervision Turning to look behind: 0.     Needs assistance Turning 360 degrees: 0.     Needs assistance Place alternate foot on stool: 1.     Completes >2 steps with minimal assist Standing with one foot in front: 0.     Loses balance while standing/stepping Standing on one foot: 0.     Unable  Total Score: 11/56   TODAY'S TREATMENT:    [facility] Adult PT Treatment:  DATE: 07/01/2022 Therapeutic Activity: Seated edge of mat table: Good mornings --> thoracic extension  Trunk rotation w/4#MB hand off  S/L straight leg hip abd/circles --> hip ext PROM/hip flexor stetch S/L open books (stabilizing at hip) Side and forward scooting (pelvis rotation) Standing heel  raises Staggered stance forward weight shifting (big toe push off) Alternating overhead reaches in standing   [facility] Adult PT Treatment:                                                DATE: 06/29/2022 Therapeutic Activity: Amb lobby <--> gym RW CGA Standing stretch at counter + alt overhead reaches Standing cross punches CGA Seated thoracic extension w/noodle Seated rows GTB LAQs 5# x15 B Fwd seated scooting (edge of mat table) LTRs (wedge + pillow) Supine bridges (ball b/w knees) Seated resisted trunk extension  NuStep for reciprocal gait pattern L5 x 68mn   PATIENT EDUCATION: Education details: Shoes with more flexibility for walking/toe push off Person educated: Patient and Caregiver   Education method: Explanation Education comprehension: verbalized understanding  HOME EXERCISE PROGRAM: Access Code: [email or web address]: [email or web address] Date: 07/01/2022 Prepared by: KHelane Gunther Exercises - Standing Tandem Balance with Counter Support  - 1 x daily - 4-5 x weekly - 1 sets - 2 reps - 30 seconds hold - Seated March  - 1 x daily - 4-5 x weekly - 2 sets - 20 reps - Sit to Stand with Counter Support  - 1 x daily - 7 x weekly - 3 sets - 10 reps - Seated Heel Toe Raises  - 1 x daily - 7 x weekly - 2 sets - 10 reps - Standing Hip Extension Kicks  - 1 x daily - 7 x weekly - 3 sets - 10 reps - Standing Hip Abduction Kicks  - 1 x daily - 7 x weekly - 3 sets - 10 reps - Standing Hip Flexion with Resistance Loop  - 1 x daily - 7 x weekly - 3 sets - 10 reps - Standing Lumbar Extension with Counter  - 1 x daily - 7 x weekly - 3 sets - 10 reps - Seated March  - 1 x daily - 7 x weekly - 3 sets - 10 reps - Seated Shoulder Row with Anchored Resistance  - 1 x daily - 7 x weekly - 3 sets - 10 reps - Lower Trunk Rotations  - 1 x daily - 7 x weekly - 3 sets - 10 reps - Supine Bridge with Mini Swiss Ball Between Knees  - 1 x daily - 7 x weekly - 3 sets - 10 reps - Standing Heel  Raise with Support  - 1 x daily - 7 x weekly - 3 sets - 10 reps - Sidelying Open Book  - 1 x daily - 7 x weekly - 3 sets - 10 reps   GOALS: Goals reviewed with patient? Yes  SHORT TERM GOALS: Target date: 05/27/2022    Will be compliant with appropriate HEP with no more than MinA from caregiver  Baseline: Goal status: MET   2.  Will be able to verbalize at least 3 ways to reduce fall risk at home and in the community  Baseline:  Goal status: MET   3.  Will be able to complete 5x sit to stand in 20 seconds or less with no  posterior LOB to show improved functional mobility  Baseline: 1 min 15 sec 05/26/22 Goal status: IN PROGRESS 12/28:IN PROGRESS 06/19/2022: PROGRESSING (47mn 4 sec)  4.  Will be able to ambulate at least 1562fwith no rest breaks and LRAD  Baseline: 2x75' with one seated rest break using RW 05/26/22 Goal status: IN PROGRESS 12/28: PARTIALLY MET 06/19/2022: MET (160' RW)   LONG TERM GOALS: Target date: 08/05/2022      Will be able to complete TUG in 40 seconds or less with LRAD and no more than Min guard to show improved balance/mobility  Baseline:  Goal status: IN PROGRESS  2.  Pt will increase BERG balance score to >/=30/56 to demonstrate improved static balance. Baseline: 19/56 (12/4) Goal status: IN PROGRESS  3.  Will be able to navigate single step with LRAD and no more than Min guard for safety  Baseline:  Goal status: MET  4.  Pain in R ankle to be no more than 5/10 at worst  Baseline:  Goal status: MET  5.  Will be able to ambulate at least 22548fith LRAD, no rest breaks, and Min guard to show improved functional mobility  Baseline:  Goal status: MET    ASSESSMENT:  CLINICAL IMPRESSION:  Trunk and pelvic rotational exercises continued to progress gait mechanics and overall ease of mobility. Patient demonstrated improved pelvic rotation during forward scooting on edge of table. Assistance provided during side lying leg raises and  circles to promote hip extension.    OBJECTIVE IMPAIRMENTS: Abnormal gait, decreased activity tolerance, decreased balance, decreased coordination, decreased knowledge of use of DME, decreased mobility, difficulty walking, decreased strength, and pain.    PLAN:  PT FREQUENCY: 2x/week  PT DURATION: 6 weeks  PLANNED INTERVENTIONS: Therapeutic exercises, Therapeutic activity, Neuromuscular re-education, Balance training, Gait training, Patient/Family education, Self Care, Joint mobilization, Stair training, DME instructions, Aquatic Therapy, Wheelchair mobility training, Biofeedback, Ionotophoresis '4mg'$ /ml Dexamethasone, Manual therapy, and Re-evaluation  PLAN FOR NEXT SESSION: Postural strengthening and mobility; dynamic balance activities; continue gait and transfer training.  KatHelane GuntherTA 07/01/2022

## 2022-07-02 ENCOUNTER — Other Ambulatory Visit: Payer: Self-pay | Admitting: Nurse Practitioner

## 2022-07-02 DIAGNOSIS — R270 Ataxia, unspecified: Secondary | ICD-10-CM

## 2022-07-02 DIAGNOSIS — G119 Hereditary ataxia, unspecified: Secondary | ICD-10-CM

## 2022-07-06 ENCOUNTER — Ambulatory Visit: Payer: Medicare Other | Attending: Nurse Practitioner

## 2022-07-06 DIAGNOSIS — R2689 Other abnormalities of gait and mobility: Secondary | ICD-10-CM | POA: Diagnosis not present

## 2022-07-06 DIAGNOSIS — R262 Difficulty in walking, not elsewhere classified: Secondary | ICD-10-CM | POA: Diagnosis not present

## 2022-07-06 DIAGNOSIS — R260 Ataxic gait: Secondary | ICD-10-CM | POA: Diagnosis not present

## 2022-07-06 DIAGNOSIS — R2681 Unsteadiness on feet: Secondary | ICD-10-CM | POA: Diagnosis not present

## 2022-07-06 NOTE — Therapy (Addendum)
OUTPATIENT PHYSICAL THERAPY NEURO TREATMENT AND 20TH VISIT PROGRESS NOTE  Patient Name: Justin [name] MRN: [id number] DOB:[birth year], 82 y.o.,, male Today's Date: 07/06/2022  PCP: Sherrie Mustache  REFERRING PROVIDER: [name], NP  Progress Note Reporting Period 04/29/22 to 07/06/2022  See note below for Objective Data and Assessment of Progress/Goals.      END OF SESSION:  PT End of Session - 07/06/22 1358     Visit Number 20    Number of Visits 29    Date for PT Re-Evaluation 08/05/22    Authorization Type BCBS MCR    Progress Note Due on Visit 20    PT Start Time 1400    PT Stop Time 1505    PT Time Calculation (min) 65 min    Activity Tolerance Patient tolerated treatment well    Behavior During Therapy [facility] for tasks assessed/performed                 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   Diaphragmatic hernia without mention of obstruction or gangrene 1982   Hiatal hernia   Diplopia 2006   Disturbance of skin sensation 2003   Diverticulosis of colon (without mention of hemorrhage) 1997   Dizziness and giddiness 2001   Dysphagia, unspecified(787.20) 1999 and   Esophagitis, unspecified 1997   GERD (gastroesophageal reflux disease) 2003   Hypertension 2003   Impotence of organic origin 1999   Lack of coordination September 07, 2007   Lumbago 2003   Migraine with aura, without mention of intractable migraine without mention of status migrainosus 1997   Myalgia and myositis, unspecified 1999   Other and unspecified hyperlipidemia 2003   Other disorders of vitreous 2003   Other malaise and fatigue 2003   Other seborrheic keratosis 2013   Other  seborrheic keratosis 04/13/09   Pain in joint, site unspecified 04/13/12   Personal history of fall 04/15/11   Restless legs syndrome (RLS) 1997   Spinocerebellar disease, 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 Donne Hazel   C3-4 anterior cervical surgery  1999   Dr. Hal Neer   CARDIAC CATHETERIZATION  2001   Dr. Glade Lloyd   COLONOSCOPY  07/20/07   Dr. 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   Internal hemorrhoids without complication 27/51/7001   Arthritis 02/21/2014   Dysarthria 02/21/2014   Seborrheic keratosis 02/21/2014   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   Tinnitus 04/12/2013   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[place]) 10/16/2012   Impotence, organic  10/12/2012   Essential hypertension    GERD (gastroesophageal reflux disease)    Dyslipidemia    Lumbago    Dysphagia     ONSET DATE: 04/17/2022  REFERRING DIAG: G11.9 (ICD-10-CM) - 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 Patient states his low back is still sore but not as bad today.  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 No  PRECAUTIONS: Fall  WEIGHT BEARING RESTRICTIONS: No  FALLS: Has patient fallen in last 6 months? No; FOF (+), not hesitant to leave the house    OBJECTIVE:  Functional test: 5x STS 05/26/22: 1 min 14 sec 5x STS 06/11/22: 32 sec no UE support 5x STS 07/06/22: 42 sec  TUG (1/24): 1:27 BERG BALANCE TEST Sitting to Standing: 4.      Stands without using hands and stabilize independently 4 Standing Unsupported: 3.      Stands 2 minutes with supervision 3 Sitting Unsupported: 4.     Sits for 2 minutes independently 4 Standing to Sitting: 4.     Sits safely with minimal use of hands 4 Transfers: 3.     Transfers safely definite use of hands 3 Standing with eyes closed: 3.     Stands 10 seconds with supervision 4 Standing with feet together: 2.     Unable to hold for 30 seconds  Reaching forward with outstretched arm: 1.     Reaches forward with supervision Retrieving object from the floor: 1.     Unable to pick up and needs supervision Turning to look behind: 2.     Turns sideways only, maintains balance Turning 360 degrees: 0.     Needs assistance Place alternate foot on stool: 1.     Completes >2 steps with minimal assist Standing with one foot in front: 0.     Loses balance while standing/stepping Standing on one foot: 0.  Unable  Total Score: 11/56   TODAY'S TREATMENT:    [facility] Adult PT Treatment:                                                DATE: 07/06/2022 Neuromuscular re-ed: S/L bent knee hip  flex/ext + thoracic rotation in opposition No Shoes: seated --> standing heel raises --> standing alternating R toe/L heel raises Therapeutic Activity: Seated good mornings (thoracic mobility) Seated trunk rotation Side Lying: passive hip ext Straight leg hip abd STS x5 hands braced on thighs: 42 sec, 33 sec TUG Berg Balance Test (partially completed)   [facility] Adult PT Treatment:                                                DATE: 07/01/2022 Therapeutic Activity: Seated edge of mat table: Good mornings --> thoracic extension  Trunk rotation w/4#MB hand off  S/L straight leg hip abd/circles --> hip ext PROM/hip flexor stetch S/L open books (stabilizing at hip) Side and forward scooting (pelvis rotation) Standing heel raises Staggered stance forward weight shifting (big toe push off) Alternating overhead reaches in standing   PATIENT EDUCATION: Education details: Shoes with more flexibility for walking/toe push off Person educated: Patient and Caregiver   Education method: Explanation Education comprehension: verbalized understanding  HOME EXERCISE PROGRAM: Access Code: [email or web address] URL: [email or web address] Date: 07/06/2022 Prepared by: Helane Gunther  Exercises - Standing Tandem Balance with Counter Support  - 1 x daily - 4-5 x weekly - 1 sets - 2 reps - 30 seconds hold - Seated March  - 1 x daily - 4-5 x weekly - 2 sets - 20 reps - Sit to Stand with Counter Support  - 1 x daily - 7 x weekly - 3 sets - 10 reps - Seated Heel Toe Raises  - 1 x daily - 7 x weekly - 2 sets - 10 reps - Standing Hip Extension Kicks  - 1 x daily - 7 x weekly - 3 sets - 10 reps - Standing Hip Abduction Kicks  - 1 x daily - 7 x weekly - 3 sets - 10 reps - Standing Hip Flexion with Resistance Loop  - 1 x daily - 7 x weekly - 3 sets - 10 reps - Standing Lumbar Extension with Counter  - 1 x daily - 7 x weekly - 3 sets - 10 reps - Seated March  - 1 x daily - 7 x weekly - 3 sets - 10 reps -  Seated Shoulder Row with Anchored Resistance  - 1 x daily - 7 x weekly - 3 sets - 10 reps - Lower Trunk Rotations  - 1 x daily - 7 x weekly - 3 sets - 10 reps - Supine Bridge with Mini Swiss Ball Between Knees  - 1 x daily - 7 x weekly - 3 sets - 10 reps - Standing Heel Raise with Support  - 1 x daily - 7 x weekly - 3 sets - 10 reps - Sidelying Open Book  - 1 x daily - 7 x weekly - 3 sets - 10 reps - Seated Heel Raise  - 1 x daily - 7 x weekly - 3 sets - 10 reps - Seated  Toe Raise  - 1 x daily - 7 x weekly - 3 sets - 10 reps - Standing Heel Raise with Support  - 1 x daily - 7 x weekly - 3 sets - 10 reps - Standing Toe Raises at Chair  - 1 x daily - 7 x weekly - 3 sets - 10 reps  GOALS: Goals reviewed with patient? Yes  SHORT TERM GOALS: Target date: 05/27/2022  Will be compliant with appropriate HEP with no more than MinA from caregiver  Baseline: Goal status: MET   2.  Will be able to verbalize at least 3 ways to reduce fall risk at home and in the community  Baseline:  Goal status: MET   3.  Will be able to complete 5x sit to stand in 20 seconds or less with no posterior LOB to show improved functional mobility  Baseline: 1 min 15 sec 05/26/22 Goal status: IN PROGRESS 12/28:IN PROGRESS 06/19/2022: PROGRESSING (43mn 4 sec) 07/06/22: PROGRESSING (33 sec)  4.  Will be able to ambulate at least 152fwith no rest breaks and LRAD  Baseline: 2x75' with one seated rest break using RW 05/26/22 Goal status: IN PROGRESS 12/28: PARTIALLY MET 06/19/2022: MET (160' RW)    LONG TERM GOALS: Target date: 08/05/2022  Will be able to complete TUG in 40 seconds or less with LRAD and no more than Min guard to show improved balance/mobility  Baseline:  Goal status: IN PROGRESS 07/06/22: 1 min 22 sec  2.  Pt will increase BERG balance score to >/=30/56 to demonstrate improved static balance. Baseline: 19/56 (12/4) Goal status: IN PROGRESS 07/06/22: N/A (partially completed)  3.  Will be able to  navigate single step with LRAD and no more than Min guard for safety  Baseline:  Goal status: MET   4.  Pain in R ankle to be no more than 5/10 at worst  Baseline:  Goal status: MET no pain  5.  Will be able to ambulate at least 22544fith LRAD, no rest breaks, and Min guard to show improved functional mobility  Baseline:  Goal status: MET     ASSESSMENT:  CLINICAL IMPRESSION:  Patient demonstrated improved independence with transfers and bed mobility, requiring occasional verbal cueing with supervision during side and forward scooting on edge of bed. Moderate postural sway exhibited during standing unsupported balance with eyes closed and gradual increase in trunk flexion during prolonged unsupported standing. Significant improvement demonstrated with STSx5 with patient completing in approximately 30 seconds. Trunk rotation exercises continued in side lying to progress reciprocal gait body mechanics and progress postural mobility. Patient continues to demonstrate goods progression with endurance and increased independence with transfers and good understanding of verbal and tactile cueing. Patient had difficulty with progression of oppositional heel raise/toe lift in standing, most significantly with unilateral heel raise. Patient will continue to benefit from therapy to progress dynamic balance and gait training, as well as overall endurance, strength, and stability for improved safety and independence with ADLs and QoL.   OBJECTIVE IMPAIRMENTS: Abnormal gait, decreased activity tolerance, decreased balance, decreased coordination, decreased knowledge of use of DME, decreased mobility, difficulty walking, decreased strength, and pain.    PLAN:  PT FREQUENCY: 2x/week  PT DURATION: 6 weeks  PLANNED INTERVENTIONS: Therapeutic exercises, Therapeutic activity, Neuromuscular re-education, Balance training, Gait training, Patient/Family education, Self Care, Joint mobilization, Stair training,  DME instructions, Aquatic Therapy, Wheelchair mobility training, Biofeedback, Ionotophoresis '4mg'$ /ml Dexamethasone, Manual therapy, and Re-evaluation  PLAN FOR NEXT SESSION: Complete Berg Balance  Test (starred items on pdf) & update LTG; Postural strengthening and mobility; dynamic balance activities; continue gait and transfer training.  Helane Gunther, PTA 07/06/2022  Estill Bamberg April Ma L Newman, PT 07/06/2022, 3:55 PM

## 2022-07-07 ENCOUNTER — Other Ambulatory Visit: Payer: Medicare Other

## 2022-07-07 DIAGNOSIS — E782 Mixed hyperlipidemia: Secondary | ICD-10-CM

## 2022-07-08 ENCOUNTER — Other Ambulatory Visit: Payer: Medicare Other

## 2022-07-08 ENCOUNTER — Ambulatory Visit: Payer: Medicare Other

## 2022-07-08 DIAGNOSIS — R2689 Other abnormalities of gait and mobility: Secondary | ICD-10-CM | POA: Diagnosis not present

## 2022-07-08 DIAGNOSIS — R260 Ataxic gait: Secondary | ICD-10-CM | POA: Diagnosis not present

## 2022-07-08 DIAGNOSIS — R2681 Unsteadiness on feet: Secondary | ICD-10-CM | POA: Diagnosis not present

## 2022-07-08 DIAGNOSIS — E782 Mixed hyperlipidemia: Secondary | ICD-10-CM | POA: Diagnosis not present

## 2022-07-08 DIAGNOSIS — R262 Difficulty in walking, not elsewhere classified: Secondary | ICD-10-CM | POA: Diagnosis not present

## 2022-07-08 NOTE — Therapy (Signed)
OUTPATIENT PHYSICAL THERAPY NEURO TREATMENT   Patient Name: Justin [name] MRN: [id number] DOB:1941/04/25, 82 y.o., male Today's Date: 07/08/2022  PCP: Sherrie Mustache  REFERRING PROVIDER: [name], NP    END OF SESSION:  PT End of Session - 07/08/22 1408     Visit Number 21    Number of Visits 29    Date for PT Re-Evaluation 08/05/22    Authorization Type BCBS MCR    PT Start Time 1400    PT Stop Time 3086    PT Time Calculation (min) 45 min    Activity Tolerance Patient tolerated treatment well    Behavior During Therapy [facility] for tasks assessed/performed                 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   Diaphragmatic hernia without mention of obstruction or gangrene 1982   Hiatal hernia   Diplopia 2006   Disturbance of skin sensation 2003   Diverticulosis of colon (without mention of hemorrhage) 1997   Dizziness and giddiness 2001   Dysphagia, unspecified(787.20) 1999 and   Esophagitis, unspecified 1997   GERD (gastroesophageal reflux disease) 2003   Hypertension 2003   Impotence of organic origin 1999   Lack of coordination September 07, 2007   Lumbago 2003   Migraine with aura, without mention of intractable migraine without mention of status migrainosus 1997   Myalgia and myositis, unspecified 1999   Other and unspecified hyperlipidemia 2003   Other disorders of vitreous 2003   Other malaise and fatigue 2003   Other seborrheic keratosis 2013   Other seborrheic keratosis 04/13/09   Pain in joint, site unspecified 04/13/12   Personal history of fall 04/15/11   Restless legs syndrome (RLS) 1997   Spinocerebellar disease, unspecified  May 01, 2008   Unspecified hearing loss 2003   Unspecified tinnitus 04/15/11   Past Surgical History:  Procedure Laterality Date   APPENDECTOMY  Age 23   boil removed     Dr. Donne Hazel   C3-4 anterior cervical surgery  1999   Dr. Hal Neer   CARDIAC CATHETERIZATION  2001   Dr. Glade Lloyd   COLONOSCOPY  07/20/07   Dr. 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[place]) 12/21/2016   Urgency incontinence 04/01/2016   History of fall 08/21/2015   Fungal dermatosis 02/20/2015   SCC (squamous cell carcinoma), 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   Internal hemorrhoids without complication 57/84/6962   Arthritis 02/21/2014   Dysarthria 02/21/2014   Seborrheic keratosis 02/21/2014   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   Tinnitus 04/12/2013   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[place]) 10/16/2012   Impotence, 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   Essential hypertension    GERD (gastroesophageal reflux disease)    Dyslipidemia    Lumbago    Dysphagia     ONSET DATE: 04/17/2022  REFERRING DIAG:  G11.9 (ICD-10-CM) - 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 No  PRECAUTIONS: Fall  WEIGHT BEARING RESTRICTIONS: No  FALLS: Has patient fallen in last 6 months? No; FOF (+), not hesitant to leave the house    OBJECTIVE:  Functional test: 5x STS 05/26/22: 1 min 14 sec 5x STS 06/11/22: 32 sec no UE support 5x STS 07/06/22: 42 sec  TUG (1/24): 1:27 BERG BALANCE TEST Sitting to Standing: 4.      Stands without using hands and stabilize independently 4 Standing Unsupported: 3.      Stands 2 minutes with supervision 3 Sitting Unsupported: 4.     Sits for 2 minutes independently 4 Standing to Sitting: 4.     Sits safely with minimal use of hands 4 Transfers: 3.     Transfers safely definite use of hands 3 Standing with eyes closed: 3.     Stands 10 seconds with supervision 4 Standing with feet together: 2.     Unable to hold for 30 seconds  Reaching forward with outstretched arm: 1.     Reaches forward with supervision Retrieving object from the floor: 1.     Unable to pick up and needs supervision Turning to look behind: 2.     Turns sideways only, maintains balance Turning 360 degrees: 0.     Needs assistance Place alternate foot on stool: 1.     Completes >2 steps with minimal assist Standing with one foot in front: 0.     Loses balance while standing/stepping Standing on one foot: 0.     Unable  Total Score: 11/56   TODAY'S TREATMENT:    [facility] Adult PT Treatment:  DATE: 07/08/2022 Therapeutic Activity: NuStep L6 for reciprocal gait patterning  Staggered stance weight shifting with focus on big toe push off Seated alt forward bending/trunk extension with overhead reach + looking up S/L bent knee hip flex/ext + thoracic rotation in opposition S/L hip abd ext leg lifts Seated paloff press  RTB --> resisted trunk rotation RTB    [facility] Adult PT Treatment:                                                DATE: 07/06/2022 Neuromuscular re-ed: S/L bent knee hip flex/ext + thoracic rotation in opposition No Shoes: seated --> standing heel raises --> standing alternating R toe/L heel raises Therapeutic Activity: Seated good mornings (thoracic mobility) Seated trunk rotation Side Lying: passive hip ext Straight leg hip abd STS x5 hands braced on thighs: 42 sec, 33 sec TUG Berg Balance Test (partially completed)   PATIENT EDUCATION: Education details: Shoes with more flexibility for walking/toe push off Person educated: Patient and Caregiver   Education method: Explanation Education comprehension: verbalized understanding  HOME EXERCISE PROGRAM: Access Code: [email or web address] URL: [email or web address] Date: 07/06/2022 Prepared by: Helane Gunther  Exercises - Standing Tandem Balance with Counter Support  - 1 x daily - 4-5 x weekly - 1 sets - 2 reps - 30 seconds hold - Seated March  - 1 x daily - 4-5 x weekly - 2 sets - 20 reps - Sit to Stand with Counter Support  - 1 x daily - 7 x weekly - 3 sets - 10 reps - Seated Heel Toe Raises  - 1 x daily - 7 x weekly - 2 sets - 10 reps - Standing Hip Extension Kicks  - 1 x daily - 7 x weekly - 3 sets - 10 reps - Standing Hip Abduction Kicks  - 1 x daily - 7 x weekly - 3 sets - 10 reps - Standing Hip Flexion with Resistance Loop  - 1 x daily - 7 x weekly - 3 sets - 10 reps - Standing Lumbar Extension with Counter  - 1 x daily - 7 x weekly - 3 sets - 10 reps - Seated March  - 1 x daily - 7 x weekly - 3 sets - 10 reps - Seated Shoulder Row with Anchored Resistance  - 1 x daily - 7 x weekly - 3 sets - 10 reps - Lower Trunk Rotations  - 1 x daily - 7 x weekly - 3 sets - 10 reps - Supine Bridge with Mini Swiss Ball Between Knees  - 1 x daily - 7 x weekly - 3 sets - 10 reps - Standing Heel Raise with Support  - 1 x daily - 7 x weekly -  3 sets - 10 reps - Sidelying Open Book  - 1 x daily - 7 x weekly - 3 sets - 10 reps - Seated Heel Raise  - 1 x daily - 7 x weekly - 3 sets - 10 reps - Seated Toe Raise  - 1 x daily - 7 x weekly - 3 sets - 10 reps - Standing Heel Raise with Support  - 1 x daily - 7 x weekly - 3 sets - 10 reps - Standing Toe Raises at Chair  - 1 x daily - 7 x weekly - 3 sets - 10 reps  GOALS: Goals reviewed with patient?  Yes  SHORT TERM GOALS: Target date: 05/27/2022  Will be compliant with appropriate HEP with no more than MinA from caregiver  Baseline: Goal status: MET   2.  Will be able to verbalize at least 3 ways to reduce fall risk at home and in the community  Baseline:  Goal status: MET   3.  Will be able to complete 5x sit to stand in 20 seconds or less with no posterior LOB to show improved functional mobility  Baseline: 1 min 15 sec 05/26/22 Goal status: IN PROGRESS 12/28:IN PROGRESS 06/19/2022: PROGRESSING (97mn 4 sec) 07/06/22: PROGRESSING (33 sec)  4.  Will be able to ambulate at least 1535fwith no rest breaks and LRAD  Baseline: 2x75' with one seated rest break using RW 05/26/22 Goal status: IN PROGRESS 12/28: PARTIALLY MET 06/19/2022: MET (160' RW)    LONG TERM GOALS: Target date: 08/05/2022  Will be able to complete TUG in 40 seconds or less with LRAD and no more than Min guard to show improved balance/mobility  Baseline:  Goal status: IN PROGRESS 07/06/22: 1 min 22 sec  2.  Pt will increase BERG balance score to >/=30/56 to demonstrate improved static balance. Baseline: 19/56 (12/4) Goal status: IN PROGRESS 07/06/22: N/A (partially completed)  3.  Will be able to navigate single step with LRAD and no more than Min guard for safety  Baseline:  Goal status: MET   4.  Pain in R ankle to be no more than 5/10 at worst  Baseline:  Goal status: MET no pain  5.  Will be able to ambulate at least 22555fith LRAD, no rest breaks, and Min guard to show improved functional mobility   Baseline:  Goal status: MET     ASSESSMENT:  CLINICAL IMPRESSION:  Improved trunk and pelvic rotation noted on R side; continued tension noted on L. Postural and core strengthening progressed with seated resisted paloff press and trunk rotation; cueing improved rotation ROM.  OBJECTIVE IMPAIRMENTS: Abnormal gait, decreased activity tolerance, decreased balance, decreased coordination, decreased knowledge of use of DME, decreased mobility, difficulty walking, decreased strength, and pain.    PLAN:  PT FREQUENCY: 2x/week  PT DURATION: 6 weeks  PLANNED INTERVENTIONS: Therapeutic exercises, Therapeutic activity, Neuromuscular re-education, Balance training, Gait training, Patient/Family education, Self Care, Joint mobilization, Stair training, DME instructions, Aquatic Therapy, Wheelchair mobility training, Biofeedback, Ionotophoresis '4mg'$ /ml Dexamethasone, Manual therapy, and Re-evaluation  PLAN FOR NEXT SESSION: Complete Berg Balance Test (starred items on pdf) & update LTG; Postural strengthening and mobility; dynamic balance activities; continue gait and transfer training.   KatHardin NegusTA 07/08/2022, 2:50 PM

## 2022-07-09 LAB — LIPID PANEL
Cholesterol: 174 mg/dL (ref <200)
HDL: 44 mg/dL (ref >=40)
LDL Cholesterol (Calc): 90 mg/dL (calc)
Non-HDL Cholesterol (Calc): 130 mg/dL (calc) — ABNORMAL HIGH (ref <130)
Total CHOL/HDL Ratio: 4 (calc) (ref <5.0)
Triglycerides: 278 mg/dL — ABNORMAL HIGH (ref <150)

## 2022-07-13 ENCOUNTER — Ambulatory Visit: Payer: Medicare Other

## 2022-07-13 DIAGNOSIS — R262 Difficulty in walking, not elsewhere classified: Secondary | ICD-10-CM | POA: Diagnosis not present

## 2022-07-13 DIAGNOSIS — R260 Ataxic gait: Secondary | ICD-10-CM

## 2022-07-13 DIAGNOSIS — R2681 Unsteadiness on feet: Secondary | ICD-10-CM | POA: Diagnosis not present

## 2022-07-13 DIAGNOSIS — R2689 Other abnormalities of gait and mobility: Secondary | ICD-10-CM

## 2022-07-13 NOTE — Therapy (Signed)
OUTPATIENT PHYSICAL THERAPY NEURO TREATMENT   Patient Name: Justin [name] MRN: [id number] DOB:1940-11-27, 82 y.o., male Today's Date: 07/13/2022  PCP: Sherrie Mustache  REFERRING PROVIDER: [name], NP    END OF SESSION:  PT End of Session - 07/13/22 1358     Visit Number 22    Number of Visits 29    Date for PT Re-Evaluation 08/05/22    Authorization Type BCBS MCR    PT Start Time 1400    PT Stop Time 1450    PT Time Calculation (min) 50 min    Activity Tolerance Patient tolerated treatment well    Behavior During Therapy [facility] for tasks assessed/performed                 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   Diaphragmatic hernia without mention of obstruction or gangrene 1982   Hiatal hernia   Diplopia 2006   Disturbance of skin sensation 2003   Diverticulosis of colon (without mention of hemorrhage) 1997   Dizziness and giddiness 2001   Dysphagia, unspecified(787.20) 1999 and   Esophagitis, unspecified 1997   GERD (gastroesophageal reflux disease) 2003   Hypertension 2003   Impotence of organic origin 1999   Lack of coordination September 07, 2007   Lumbago 2003   Migraine with aura, without mention of intractable migraine without mention of status migrainosus 1997   Myalgia and myositis, unspecified 1999   Other and unspecified hyperlipidemia 2003   Other disorders of vitreous 2003   Other malaise and fatigue 2003   Other seborrheic keratosis 2013   Other seborrheic keratosis 04/13/09   Pain in joint, site unspecified 04/13/12   Personal history of fall 04/15/11   Restless legs syndrome (RLS) 1997   Spinocerebellar disease, 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 Donne Hazel   C3-4 anterior cervical surgery  1999   Dr. Hal Neer   CARDIAC CATHETERIZATION  2001   Dr. Glade Lloyd   COLONOSCOPY  07/20/07   Dr. 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   Internal hemorrhoids without complication 123456   Arthritis 02/21/2014   Dysarthria 02/21/2014   Seborrheic keratosis 02/21/2014   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   Tinnitus 04/12/2013   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[place]) 10/16/2012   Impotence, 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   Essential hypertension    GERD (gastroesophageal reflux disease)    Dyslipidemia    Lumbago    Dysphagia     ONSET DATE: 04/17/2022  REFERRING DIAG:  G11.9 (ICD-10-CM) - 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 Patient states he has been practicing  alternating unilateral toe/heel lifts.  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 No  PRECAUTIONS: Fall  WEIGHT BEARING RESTRICTIONS: No  FALLS: Has patient fallen in last 6 months? No; FOF (+), not hesitant to leave the house    OBJECTIVE:  Functional test: 5x STS 05/26/22: 1 min 14 sec 5x STS 06/11/22: 32 sec no UE support 5x STS 07/06/22: 42 sec  TUG (1/24): 1:27 BERG BALANCE TEST Sitting to Standing: 4.      Stands without using hands and stabilize independently  Standing Unsupported: 3.      Stands 2 minutes with supervision  Sitting Unsupported: 4.     Sits for 2 minutes independently  Standing to Sitting: 4.     Sits safely with minimal use of hands  Transfers: 3.     Transfers safely definite use of hands 3 Standing with eyes closed: 3.     Stands 10 seconds with supervision  Standing with feet together: 3.     Stands for 1 minute with supervision Reaching forward with outstretched arm: 1.     Reaches forward with supervision  Retrieving object from the floor: 1.     Unable to pick up and needs supervision Turning to look behind: 3.     Looks behind one side only, other side less weight shift Turning 360 degrees: 1.     Needs supervision or verbal cueing Place alternate foot on stool: 1.     Completes >2 steps with minimal assist Standing with one foot in front: 0.     Loses balance while standing/stepping Standing on one foot: 0.     Unable  Total Score: 31/56   TODAY'S TREATMENT:    [facility] Adult PT Treatment:  DATE: 07/13/2022 Therapeutic Activity: Seated: Alt forward bending + thoracic extension Shoulder flexion with foam roller + thoracic extension Scapular squeezes with pool noodle --> added rows GTB Trunk rotation GTB x10 B Bow & arrow GTB x10  B Paloff press GTB --> added perturbations GTB Side Lying: bent knee hip flex/ext + thoracic rotation in opposition Straight leg hip abd & hip ext leg lifts Sit <-> supine transfers I (SBA as needed) Supine bridges + ball squeeze   [facility] Adult PT Treatment:                                                DATE: 07/08/2022 Therapeutic Activity: NuStep L6 for reciprocal gait patterning  Staggered stance weight shifting with focus on big toe push off Seated alt forward bending/trunk extension with overhead reach + looking up S/L bent knee hip flex/ext + thoracic rotation in opposition S/L hip abd ext leg lifts Seated paloff press RTB --> resisted trunk rotation RTB     PATIENT EDUCATION: Education details: Shoes with more flexibility for walking/toe push off Person educated: Patient and Caregiver   Education method: Explanation Education comprehension: verbalized understanding  HOME EXERCISE PROGRAM: Access Code: [email or web address] URL: [email or web address] Date: 07/13/2022 Prepared by: Helane Gunther  Exercises - Standing Tandem Balance with Counter Support  - 1 x daily - 4-5 x weekly - 1 sets - 2 reps - 30 seconds hold - Seated March  - 1 x daily - 4-5 x weekly - 2 sets - 20 reps - Sit to Stand with Counter Support  - 1 x daily - 7 x weekly - 3 sets - 10 reps - Seated Heel Toe Raises  - 1 x daily - 7 x weekly - 2 sets - 10 reps - Standing Hip Extension Kicks  - 1 x daily - 7 x weekly - 3 sets - 10 reps - Standing Hip Abduction Kicks  - 1 x daily - 7 x weekly - 3 sets - 10 reps - Standing Hip Flexion with Resistance Loop  - 1 x daily - 7 x weekly - 3 sets - 10 reps - Standing Lumbar Extension with Counter  - 1 x daily - 7 x weekly - 3 sets - 10 reps - Seated March  - 1 x daily - 7 x weekly - 3 sets - 10 reps - Seated Shoulder Row with Anchored Resistance  - 1 x daily - 7 x weekly - 3 sets - 10 reps - Lower Trunk Rotations  - 1 x daily - 7 x weekly - 3 sets - 10 reps - Supine  Bridge with Mini Swiss Ball Between Knees  - 1 x daily - 7 x weekly - 3 sets - 10 reps - Standing Heel Raise with Support  - 1 x daily - 7 x weekly - 3 sets - 10 reps - Sidelying Open Book  - 1 x daily - 7 x weekly - 3 sets - 10 reps - Seated Heel Raise  - 1 x daily - 7 x weekly - 3 sets - 10 reps - Seated Toe Raise  - 1 x daily - 7 x weekly - 3 sets - 10 reps - Standing Heel Raise with Support  - 1 x daily - 7 x weekly - 3 sets - 10 reps - Standing Toe Raises at Chair  - 1 x  daily - 7 x weekly - 3 sets - 10 reps - Seated Anti-Rotation Press With Anchored Resistance  - 1 x daily - 7 x weekly - 3 sets - 10 reps - Seated Trunk Rotation with Anchored Resistance  - 1 x daily - 7 x weekly - 3 sets - 10 reps - Drawing Bow  - 1 x daily - 7 x weekly - 3 sets - 10 reps - Seated Punches with Trunk Rotation  - 1 x daily - 7 x weekly - 3 sets - 10 reps - Supine Hamstring Stretch with Strap  - 1 x daily - 7 x weekly - 3 sets - 10 reps  GOALS: Goals reviewed with patient? Yes  SHORT TERM GOALS: Target date: 05/27/2022  Will be compliant with appropriate HEP with no more than MinA from caregiver  Baseline: Goal status: MET   2.  Will be able to verbalize at least 3 ways to reduce fall risk at home and in the community  Baseline:  Goal status: MET   3.  Will be able to complete 5x sit to stand in 20 seconds or less with no posterior LOB to show improved functional mobility  Baseline: 1 min 15 sec 05/26/22 Goal status: IN PROGRESS 12/28:IN PROGRESS 06/19/2022: PROGRESSING (74mn 4 sec) 07/06/22: PROGRESSING (33 sec)  4.  Will be able to ambulate at least 1561fwith no rest breaks and LRAD  Baseline: 2x75' with one seated rest break using RW 05/26/22 Goal status: IN PROGRESS 12/28: PARTIALLY MET 06/19/2022: MET (160' RW)    LONG TERM GOALS: Target date: 08/05/2022  Will be able to complete TUG in 40 seconds or less with LRAD and no more than Min guard to show improved balance/mobility  Baseline:   Goal status: IN PROGRESS 07/06/22: 1 min 22 sec  2.  Pt will increase BERG balance score to >/=30/56 to demonstrate improved static balance. Baseline: 19/56 (12/4) Goal status: IN PROGRESS 07/08/22: 31/56  3.  Will be able to navigate single step with LRAD and no more than Min guard for safety  Baseline:  Goal status: MET   4.  Pain in R ankle to be no more than 5/10 at worst  Baseline:  Goal status: MET no pain  5.  Will be able to ambulate at least 22547fith LRAD, no rest breaks, and Min guard to show improved functional mobility  Baseline:  Goal status: MET     ASSESSMENT:  CLINICAL IMPRESSION:  Trunk rotation progressed with alternating unilateral rows to facilitate reciprocal patterning. Occasional tactile cues provided during side lying leg lifts to improve patient's proprioceptive awareness to maintain alignment. Hip add isometric with ball squeeze during supine bridges alleviated lumbar discomfort and improved pelvic stability.  OBJECTIVE IMPAIRMENTS: Abnormal gait, decreased activity tolerance, decreased balance, decreased coordination, decreased knowledge of use of DME, decreased mobility, difficulty walking, decreased strength, and pain.    PLAN:  PT FREQUENCY: 2x/week  PT DURATION: 6 weeks  PLANNED INTERVENTIONS: Therapeutic exercises, Therapeutic activity, Neuromuscular re-education, Balance training, Gait training, Patient/Family education, Self Care, Joint mobilization, Stair training, DME instructions, Aquatic Therapy, Wheelchair mobility training, Biofeedback, Ionotophoresis 4mg50m Dexamethasone, Manual therapy, and Re-evaluation  PLAN FOR NEXT SESSION: Dynamic standing balance; backwards walking; postural strengthening and mobility.  KathHardin NegusA 07/13/2022, 2:56 PM

## 2022-07-15 ENCOUNTER — Ambulatory Visit: Payer: Medicare Other

## 2022-07-15 DIAGNOSIS — R2681 Unsteadiness on feet: Secondary | ICD-10-CM | POA: Diagnosis not present

## 2022-07-15 DIAGNOSIS — R260 Ataxic gait: Secondary | ICD-10-CM

## 2022-07-15 DIAGNOSIS — R2689 Other abnormalities of gait and mobility: Secondary | ICD-10-CM

## 2022-07-15 DIAGNOSIS — R262 Difficulty in walking, not elsewhere classified: Secondary | ICD-10-CM | POA: Diagnosis not present

## 2022-07-15 NOTE — Therapy (Signed)
OUTPATIENT PHYSICAL THERAPY NEURO TREATMENT   Patient Name: Justin [name] MRN: [id number] DOB:1941-02-12, 82 y.o., male Today's Date: 07/15/2022  PCP: Sherrie Mustache  REFERRING PROVIDER: [name], NP    END OF SESSION:  PT End of Session - 07/15/22 1411     Visit Number 23    Number of Visits 29    Date for PT Re-Evaluation 08/05/22    Authorization Type BCBS MCR    PT Start Time 1400    PT Stop Time 1450    PT Time Calculation (min) 50 min    Activity Tolerance Patient tolerated treatment well    Behavior During Therapy [facility] for tasks assessed/performed                 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   Diaphragmatic hernia without mention of obstruction or gangrene 1982   Hiatal hernia   Diplopia 2006   Disturbance of skin sensation 2003   Diverticulosis of colon (without mention of hemorrhage) 1997   Dizziness and giddiness 2001   Dysphagia, unspecified(787.20) 1999 and   Esophagitis, unspecified 1997   GERD (gastroesophageal reflux disease) 2003   Hypertension 2003   Impotence of organic origin 1999   Lack of coordination September 07, 2007   Lumbago 2003   Migraine with aura, without mention of intractable migraine without mention of status migrainosus 1997   Myalgia and myositis, unspecified 1999   Other and unspecified hyperlipidemia 2003   Other disorders of vitreous 2003   Other malaise and fatigue 2003   Other seborrheic keratosis 2013   Other seborrheic keratosis 04/13/09   Pain in joint, site unspecified 04/13/12   Personal history of fall 04/15/11   Restless legs syndrome (RLS) 1997   Spinocerebellar disease, 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 Donne Hazel   C3-4 anterior cervical surgery  1999   Dr. Hal Neer   CARDIAC CATHETERIZATION  2001   Dr. Glade Lloyd   COLONOSCOPY  07/20/07   Dr. 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[place]) 12/21/2016   Urgency incontinence 04/01/2016   History of fall 08/21/2015   Fungal dermatosis 02/20/2015   SCC (squamous cell carcinoma), 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   Internal hemorrhoids without complication 123456   Arthritis 02/21/2014   Dysarthria 02/21/2014   Seborrheic keratosis 02/21/2014   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   Tinnitus 04/12/2013   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/16/2012   Impotence, 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   Essential hypertension    GERD (gastroesophageal reflux disease)    Dyslipidemia    Lumbago    Dysphagia     ONSET DATE: 04/17/2022  REFERRING DIAG:  G11.9 (ICD-10-CM) - 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 No  PRECAUTIONS: Fall  WEIGHT BEARING RESTRICTIONS: No  FALLS: Has patient fallen in last 6 months? No; FOF (+), not hesitant to leave the house    OBJECTIVE:  Functional test: 5x STS 05/26/22: 1 min 14 sec 5x STS 06/11/22: 32 sec no UE support 5x STS 07/06/22: 42 sec  TUG (1/24): 1:27 BERG BALANCE TEST Sitting to Standing: 4.      Stands without using hands and stabilize independently  Standing Unsupported: 3.      Stands 2 minutes with supervision  Sitting Unsupported: 4.     Sits for 2 minutes independently  Standing to Sitting: 4.     Sits safely with minimal use of hands  Transfers: 3.     Transfers safely definite use of hands 3 Standing with eyes closed: 3.     Stands 10 seconds with supervision  Standing with feet together: 3.     Stands for 1 minute with supervision Reaching forward with outstretched arm: 1.     Reaches forward with supervision  Retrieving object from the floor: 1.     Unable to pick up and needs supervision Turning to look behind: 3.     Looks behind one side only, other side less weight shift Turning 360 degrees: 1.     Needs supervision or verbal cueing Place alternate foot on stool: 1.     Completes >2 steps with minimal assist Standing with one foot in front: 0.     Loses balance while standing/stepping Standing on one foot: 0.     Unable  Total Score: 31/56   TODAY'S TREATMENT:    [facility] Adult PT Treatment:  DATE: 07/15/2022 Therapeutic Activity: Seated: Alt forward bending + thoracic extension (therapist back to back to provide trunk support)  Shoulder flexion with thoracic extension 2# dowel Side scooting R<-->L Cross punches 2#DB Side Lying: Open books (hand behind head) Straight leg hip abd with light  resistance  AAROM hip ext   Bkwd walking w/FWW (CGA) Standing unsupported at counter (CGA) + overhead reaching --> placing cones on top cabinet shelf    [facility] Adult PT Treatment:                                                DATE: 07/13/2022 Therapeutic Activity: Seated: Alt forward bending + thoracic extension Shoulder flexion with foam roller + thoracic extension Scapular squeezes with pool noodle --> added rows GTB Trunk rotation GTB x10 B Bow & arrow GTB x10 B Paloff press GTB --> added perturbations GTB Side Lying: bent knee hip flex/ext + thoracic rotation in opposition Straight leg hip abd & hip ext leg lifts Sit <-> supine transfers I (SBA as needed) Supine bridges + ball squeeze   PATIENT EDUCATION: Education details: Practicing standing balance putting cups/dishes in top cabinets Person educated: Patient and Caregiver   Education method: Explanation Education comprehension: verbalized understanding  HOME EXERCISE PROGRAM: Access Code: [email or web address] URL: [email or web address] Date: 07/13/2022 Prepared by: Helane Gunther  Exercises - Standing Tandem Balance with Counter Support  - 1 x daily - 4-5 x weekly - 1 sets - 2 reps - 30 seconds hold - Seated March  - 1 x daily - 4-5 x weekly - 2 sets - 20 reps - Sit to Stand with Counter Support  - 1 x daily - 7 x weekly - 3 sets - 10 reps - Seated Heel Toe Raises  - 1 x daily - 7 x weekly - 2 sets - 10 reps - Standing Hip Extension Kicks  - 1 x daily - 7 x weekly - 3 sets - 10 reps - Standing Hip Abduction Kicks  - 1 x daily - 7 x weekly - 3 sets - 10 reps - Standing Hip Flexion with Resistance Loop  - 1 x daily - 7 x weekly - 3 sets - 10 reps - Standing Lumbar Extension with Counter  - 1 x daily - 7 x weekly - 3 sets - 10 reps - Seated March  - 1 x daily - 7 x weekly - 3 sets - 10 reps - Seated Shoulder Row with Anchored Resistance  - 1 x daily - 7 x weekly - 3 sets - 10 reps - Lower Trunk Rotations  - 1 x daily  - 7 x weekly - 3 sets - 10 reps - Supine Bridge with Mini Swiss Ball Between Knees  - 1 x daily - 7 x weekly - 3 sets - 10 reps - Standing Heel Raise with Support  - 1 x daily - 7 x weekly - 3 sets - 10 reps - Sidelying Open Book  - 1 x daily - 7 x weekly - 3 sets - 10 reps - Seated Heel Raise  - 1 x daily - 7 x weekly - 3 sets - 10 reps - Seated Toe Raise  - 1 x daily - 7 x weekly - 3 sets - 10 reps - Standing Heel Raise with Support  - 1 x daily - 7 x weekly - 3 sets -  10 reps - Standing Toe Raises at Chair  - 1 x daily - 7 x weekly - 3 sets - 10 reps - Seated Anti-Rotation Press With Anchored Resistance  - 1 x daily - 7 x weekly - 3 sets - 10 reps - Seated Trunk Rotation with Anchored Resistance  - 1 x daily - 7 x weekly - 3 sets - 10 reps - Drawing Bow  - 1 x daily - 7 x weekly - 3 sets - 10 reps - Seated Punches with Trunk Rotation  - 1 x daily - 7 x weekly - 3 sets - 10 reps - Supine Hamstring Stretch with Strap  - 1 x daily - 7 x weekly - 3 sets - 10 reps  GOALS: Goals reviewed with patient? Yes  SHORT TERM GOALS: Target date: 05/27/2022  Will be compliant with appropriate HEP with no more than MinA from caregiver  Baseline: Goal status: MET   2.  Will be able to verbalize at least 3 ways to reduce fall risk at home and in the community  Baseline:  Goal status: MET   3.  Will be able to complete 5x sit to stand in 20 seconds or less with no posterior LOB to show improved functional mobility  Baseline: 1 min 15 sec 05/26/22 Goal status: IN PROGRESS 12/28:IN PROGRESS 06/19/2022: PROGRESSING (67mn 4 sec) 07/06/22: PROGRESSING (33 sec)  4.  Will be able to ambulate at least 1557fwith no rest breaks and LRAD  Baseline: 2x75' with one seated rest break using RW 05/26/22 Goal status: IN PROGRESS 12/28: PARTIALLY MET 06/19/2022: MET (160' RW)    LONG TERM GOALS: Target date: 08/05/2022  Will be able to complete TUG in 40 seconds or less with LRAD and no more than Min guard to  show improved balance/mobility  Baseline:  Goal status: IN PROGRESS 07/06/22: 1 min 22 sec  2.  Pt will increase BERG balance score to >/=30/56 to demonstrate improved static balance. Baseline: 19/56 (12/4) Goal status: IN PROGRESS 07/08/22: 31/56  3.  Will be able to navigate single step with LRAD and no more than Min guard for safety  Baseline:  Goal status: MET   4.  Pain in R ankle to be no more than 5/10 at worst  Baseline:  Goal status: MET no pain  5.  Will be able to ambulate at least 22542fith LRAD, no rest breaks, and Min guard to show improved functional mobility  Baseline:  Goal status: MET     ASSESSMENT:  CLINICAL IMPRESSION:  Patient continues to demonstrate good progression with independent bed mobility and increased ease of movement. Patient performed variety of supine/side lying and and ambulatory exercises to progress and challenge endurance. Adding hip abd isometric activation during supine bridges decreased lumbar discomfort/mild pain. Dynamic balance and postural stability challenged with unsupported overhead reaching with close SBA/CGA.  OBJECTIVE IMPAIRMENTS: Abnormal gait, decreased activity tolerance, decreased balance, decreased coordination, decreased knowledge of use of DME, decreased mobility, difficulty walking, decreased strength, and pain.    PLAN:  PT FREQUENCY: 2x/week  PT DURATION: 6 weeks  PLANNED INTERVENTIONS: Therapeutic exercises, Therapeutic activity, Neuromuscular re-education, Balance training, Gait training, Patient/Family education, Self Care, Joint mobilization, Stair training, DME instructions, Aquatic Therapy, Wheelchair mobility training, Biofeedback, Ionotophoresis 4mg67m Dexamethasone, Manual therapy, and Re-evaluation  PLAN FOR NEXT SESSION: Dynamic standing balance; backwards walking; postural strengthening and mobility.  KathHardin NegusA 07/15/2022, 4:12 PM

## 2022-07-20 ENCOUNTER — Ambulatory Visit: Payer: Medicare Other

## 2022-07-20 DIAGNOSIS — R262 Difficulty in walking, not elsewhere classified: Secondary | ICD-10-CM | POA: Diagnosis not present

## 2022-07-20 DIAGNOSIS — R260 Ataxic gait: Secondary | ICD-10-CM | POA: Diagnosis not present

## 2022-07-20 DIAGNOSIS — R2689 Other abnormalities of gait and mobility: Secondary | ICD-10-CM

## 2022-07-20 DIAGNOSIS — R2681 Unsteadiness on feet: Secondary | ICD-10-CM | POA: Diagnosis not present

## 2022-07-20 NOTE — Therapy (Signed)
OUTPATIENT PHYSICAL THERAPY NEURO TREATMENT   Patient Name: Justin [name] MRN: [id number] DOB:02/03/1941, 82 y.o., male Today's Date: 07/20/2022  PCP: Sherrie Mustache  REFERRING PROVIDER: [name], NP    END OF SESSION:  PT End of Session - 07/20/22 1408     Visit Number 24    Number of Visits 29    Date for PT Re-Evaluation 08/05/22    Authorization Type BCBS MCR    PT Start Time [id number]    PT Stop Time [id number]    PT Time Calculation (min) 45 min    Activity Tolerance Patient tolerated treatment well    Behavior During Therapy [facility] for tasks assessed/performed                 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   Diaphragmatic hernia without mention of obstruction or gangrene 1982   Hiatal hernia   Diplopia 2006   Disturbance of skin sensation 2003   Diverticulosis of colon (without mention of hemorrhage) 1997   Dizziness and giddiness 2001   Dysphagia, unspecified(787.20) 1999 and   Esophagitis, unspecified 1997   GERD (gastroesophageal reflux disease) 2003   Hypertension 2003   Impotence of organic origin 1999   Lack of coordination September 07, 2007   Lumbago 2003   Migraine with aura, without mention of intractable migraine without mention of status migrainosus 1997   Myalgia and myositis, unspecified 1999   Other and unspecified hyperlipidemia 2003   Other disorders of vitreous 2003   Other malaise and fatigue 2003   Other seborrheic keratosis 2013   Other seborrheic keratosis 04/13/09   Pain in joint, site unspecified 04/13/12   Personal history of fall 04/15/11   Restless legs syndrome (RLS) 1997   Spinocerebellar disease, 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 Donne Hazel   C3-4 anterior cervical surgery  1999   Dr. Hal Neer   CARDIAC CATHETERIZATION  2001   Dr. Glade Lloyd   COLONOSCOPY  07/20/07   Dr. 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   Internal hemorrhoids without complication [id number]   Arthritis 02/21/2014   Dysarthria 02/21/2014   Seborrheic keratosis 02/21/2014   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   Tinnitus 04/12/2013   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[place]) 10/16/2012   Impotence, 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   Essential hypertension    GERD (gastroesophageal reflux disease)    Dyslipidemia    Lumbago    Dysphagia     ONSET DATE: 04/17/2022  REFERRING DIAG:  G11.9 (ICD-10-CM) - 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 Patient and his caretaker both say they can tell he is  getting stronger and is able to stand for longer.  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 No  PRECAUTIONS: Fall  WEIGHT BEARING RESTRICTIONS: No  FALLS: Has patient fallen in last 6 months? No; FOF (+), not hesitant to leave the house    OBJECTIVE:  Functional test: 5x STS 05/26/22: 1 min 14 sec 5x STS 06/11/22: 32 sec no UE support 5x STS 07/06/22: 42 sec  TUG (1/24): 1:27 BERG BALANCE TEST Sitting to Standing: 4.      Stands without using hands and stabilize independently  Standing Unsupported: 3.      Stands 2 minutes with supervision  Sitting Unsupported: 4.     Sits for 2 minutes independently  Standing to Sitting: 4.     Sits safely with minimal use of hands  Transfers: 3.     Transfers safely definite use of hands 3 Standing with eyes closed: 3.     Stands 10 seconds with supervision  Standing with feet together: 3.     Stands for 1 minute with supervision Reaching forward with outstretched arm: 1.     Reaches forward with supervision  Retrieving object from the floor: 1.     Unable to pick up and needs supervision Turning to look behind: 3.     Looks behind one side only, other side less weight shift Turning 360 degrees: 1.     Needs supervision or verbal cueing Place alternate foot on stool: 1.     Completes >2 steps with minimal assist Standing with one foot in front: 0.     Loses balance while standing/stepping Standing on one foot: 0.     Unable  Total Score: 31/56   TODAY'S TREATMENT:    [facility] Adult PT Treatment:  DATE: 07/20/2022 Therapeutic Activity: Seated: Alt forward bending + thoracic extension (therapist back to back to provide trunk support)  Lateral side bend w/hand behind head Side Lying: Straight leg hip abd --> leg raises  front/back Bridges with isometric hip abd Standing (2#DB): Forward punch reaching beyond BOS Overhead punch (looking up with punch) STS --> hip hinge mechanics and chest lift to upright standing posture Stairs (4" side): Alt toe taps Runner's lunge stretch (foot on top step) Forward stepping up --> Backwards stepping down Counter: Tree surgeon with opposite hand placed above on cabinet  Overhead hand placement on cabinet doors: standing backbend stretch   [facility] Adult PT Treatment:                                                DATE: 07/15/2022 Therapeutic Activity: Seated: Shoulder flexion with thoracic extension 2# dowel Side scooting R<-->L Cross punches 2#DB Side Lying: Open books (hand behind head) Straight leg hip abd with light resistance  AAROM hip ext   Bkwd walking w/FWW (CGA) Standing unsupported at counter (CGA) + overhead reaching --> placing cones on top cabinet shelf    [facility] Adult PT Treatment:                                                DATE: 07/13/2022 Therapeutic Activity: Seated: Alt forward bending + thoracic extension Shoulder flexion with foam roller + thoracic extension Scapular squeezes with pool noodle --> added rows GTB Trunk rotation GTB x10 B Bow & arrow GTB x10 B Paloff press GTB --> added perturbations GTB Side Lying: bent knee hip flex/ext + thoracic rotation in opposition Straight leg hip abd & hip ext leg lifts Sit <-> supine transfers I (SBA as needed) Supine bridges + ball squeeze   PATIENT EDUCATION: Education details: Practicing standing balance putting cups/dishes in top cabinets Person educated: Patient and Caregiver   Education method: Explanation Education comprehension: verbalized understanding  HOME EXERCISE PROGRAM: Access Code: [email or web address] URL: [email or web address] Date: 07/13/2022 Prepared by: Helane Gunther  Exercises - Standing Tandem Balance with Counter Support  - 1 x daily - 4-5 x weekly - 1  sets - 2 reps - 30 seconds hold - Seated March  - 1 x daily - 4-5 x weekly - 2 sets - 20 reps - Sit to Stand with Counter Support  - 1 x daily - 7 x weekly - 3 sets - 10 reps - Seated Heel Toe Raises  - 1 x daily - 7 x weekly - 2 sets - 10 reps - Standing Hip Extension Kicks  - 1 x daily - 7 x weekly - 3 sets - 10 reps - Standing Hip Abduction Kicks  - 1 x daily - 7 x weekly - 3 sets - 10 reps - Standing Hip Flexion with Resistance Loop  - 1 x daily - 7 x weekly - 3 sets - 10 reps - Standing Lumbar Extension with Counter  - 1 x daily - 7 x weekly - 3 sets - 10 reps - Seated March  - 1 x daily - 7 x weekly - 3 sets - 10 reps - Seated Shoulder Row with Anchored Resistance  - 1 x daily - 7 x weekly -  3 sets - 10 reps - Lower Trunk Rotations  - 1 x daily - 7 x weekly - 3 sets - 10 reps - Supine Bridge with Mini Swiss Ball Between Knees  - 1 x daily - 7 x weekly - 3 sets - 10 reps - Standing Heel Raise with Support  - 1 x daily - 7 x weekly - 3 sets - 10 reps - Sidelying Open Book  - 1 x daily - 7 x weekly - 3 sets - 10 reps - Seated Heel Raise  - 1 x daily - 7 x weekly - 3 sets - 10 reps - Seated Toe Raise  - 1 x daily - 7 x weekly - 3 sets - 10 reps - Standing Heel Raise with Support  - 1 x daily - 7 x weekly - 3 sets - 10 reps - Standing Toe Raises at Chair  - 1 x daily - 7 x weekly - 3 sets - 10 reps - Seated Anti-Rotation Press With Anchored Resistance  - 1 x daily - 7 x weekly - 3 sets - 10 reps - Seated Trunk Rotation with Anchored Resistance  - 1 x daily - 7 x weekly - 3 sets - 10 reps - Drawing Bow  - 1 x daily - 7 x weekly - 3 sets - 10 reps - Seated Punches with Trunk Rotation  - 1 x daily - 7 x weekly - 3 sets - 10 reps - Supine Hamstring Stretch with Strap  - 1 x daily - 7 x weekly - 3 sets - 10 reps  GOALS: Goals reviewed with patient? Yes  SHORT TERM GOALS: Target date: 05/27/2022  Will be compliant with appropriate HEP with no more than MinA from caregiver  Baseline: Goal  status: MET   2.  Will be able to verbalize at least 3 ways to reduce fall risk at home and in the community  Baseline:  Goal status: MET   3.  Will be able to complete 5x sit to stand in 20 seconds or less with no posterior LOB to show improved functional mobility  Baseline: 1 min 15 sec 05/26/22 Goal status: IN PROGRESS 12/28:IN PROGRESS 06/19/2022: PROGRESSING (65mn 4 sec) 07/06/22: PROGRESSING (33 sec)  4.  Will be able to ambulate at least 1549fwith no rest breaks and LRAD  Baseline: 2x75' with one seated rest break using RW 05/26/22 Goal status: IN PROGRESS 12/28: PARTIALLY MET 06/19/2022: MET (160' RW)    LONG TERM GOALS: Target date: 08/05/2022  Will be able to complete TUG in 40 seconds or less with LRAD and no more than Min guard to show improved balance/mobility  Baseline:  Goal status: IN PROGRESS 07/06/22: 1 min 22 sec  2.  Pt will increase BERG balance score to >/=30/56 to demonstrate improved static balance. Baseline: 19/56 (12/4) Goal status: IN PROGRESS 07/08/22: 31/56  3.  Will be able to navigate single step with LRAD and no more than Min guard for safety  Baseline:  Goal status: MET   4.  Pain in R ankle to be no more than 5/10 at worst  Baseline:  Goal status: MET no pain  5.  Will be able to ambulate at least 2257fith LRAD, no rest breaks, and Min guard to show improved functional mobility  Baseline:  Goal status: MET     ASSESSMENT:  CLINICAL IMPRESSION:  Patient demonstrated increased speed and mobility with transitions from sit to side lying/supine with no assist or cueing  from therapist. Postural stability and balance progressed with standing reaching beyond base of support and overhead presses with light hand weights; CGA provided for additional support in standing. Backwards stepping during descending of stairs completed to promote functional hip extension mobility.     OBJECTIVE IMPAIRMENTS: Abnormal gait, decreased activity tolerance,  decreased balance, decreased coordination, decreased knowledge of use of DME, decreased mobility, difficulty walking, decreased strength, and pain.    PLAN:  PT FREQUENCY: 2x/week  PT DURATION: 6 weeks  PLANNED INTERVENTIONS: Therapeutic exercises, Therapeutic activity, Neuromuscular re-education, Balance training, Gait training, Patient/Family education, Self Care, Joint mobilization, Stair training, DME instructions, Aquatic Therapy, Wheelchair mobility training, Biofeedback, Ionotophoresis 58m/ml Dexamethasone, Manual therapy, and Re-evaluation  PLAN FOR NEXT SESSION: Dynamic standing balance; postural& core/hip strengthening, pelvic/trunk mobility.  KHardin Negus PTA 07/20/2022, 2:53 PM

## 2022-07-22 ENCOUNTER — Ambulatory Visit: Payer: Medicare Other | Admitting: Physical Therapy

## 2022-07-24 ENCOUNTER — Ambulatory Visit: Payer: Medicare Other

## 2022-07-24 DIAGNOSIS — R262 Difficulty in walking, not elsewhere classified: Secondary | ICD-10-CM

## 2022-07-24 DIAGNOSIS — R2681 Unsteadiness on feet: Secondary | ICD-10-CM | POA: Diagnosis not present

## 2022-07-24 DIAGNOSIS — R2689 Other abnormalities of gait and mobility: Secondary | ICD-10-CM | POA: Diagnosis not present

## 2022-07-24 DIAGNOSIS — R260 Ataxic gait: Secondary | ICD-10-CM

## 2022-07-24 NOTE — Therapy (Signed)
OUTPATIENT PHYSICAL THERAPY NEURO TREATMENT   Patient Name: Justin [name] MRN: [id number] DOB:01-08-1941, 82 y.o., male Today's Date: 07/24/2022  PCP: Sherrie Mustache  REFERRING PROVIDER: [name], NP    END OF SESSION:  PT End of Session - 07/24/22 1105     Visit Number 25    Number of Visits 29    Date for PT Re-Evaluation 08/05/22    Authorization Type BCBS MCR    PT Start Time 1100    PT Stop Time [id number]    PT Time Calculation (min) 45 min    Activity Tolerance Patient tolerated treatment well    Behavior During Therapy [facility] for tasks assessed/performed                 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   Diaphragmatic hernia without mention of obstruction or gangrene 1982   Hiatal hernia   Diplopia 2006   Disturbance of skin sensation 2003   Diverticulosis of colon (without mention of hemorrhage) 1997   Dizziness and giddiness 2001   Dysphagia, unspecified(787.20) 1999 and   Esophagitis, unspecified 1997   GERD (gastroesophageal reflux disease) 2003   Hypertension 2003   Impotence of organic origin 1999   Lack of coordination September 07, 2007   Lumbago 2003   Migraine with aura, without mention of intractable migraine without mention of status migrainosus 1997   Myalgia and myositis, unspecified 1999   Other and unspecified hyperlipidemia 2003   Other disorders of vitreous 2003   Other malaise and fatigue 2003   Other seborrheic keratosis 2013   Other seborrheic keratosis 04/13/09   Pain in joint, site unspecified 04/13/12   Personal history of fall 04/15/11   Restless legs syndrome (RLS) 1997   Spinocerebellar disease, unspecified  May 01, 2008   Unspecified hearing loss 2003   Unspecified tinnitus 04/15/11   Past Surgical History:  Procedure Laterality Date   APPENDECTOMY  Age 59   boil removed     Dr. Donne Hazel   C3-4 anterior cervical surgery  1999   Dr. Hal Neer   CARDIAC CATHETERIZATION  2001   Dr. Glade Lloyd   COLONOSCOPY  07/20/07   Dr. 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[place]) 12/21/2016   Urgency incontinence 04/01/2016   History of fall 08/21/2015   Fungal dermatosis 02/20/2015   SCC (squamous cell carcinoma), 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   Internal hemorrhoids without complication 123456   Arthritis 02/21/2014   Dysarthria 02/21/2014   Seborrheic keratosis 02/21/2014   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   Tinnitus 04/12/2013   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[place]) 10/16/2012   Impotence, 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   Essential hypertension    GERD (gastroesophageal reflux disease)    Dyslipidemia    Lumbago    Dysphagia     ONSET DATE: 04/17/2022  REFERRING DIAG:  G11.9 (ICD-10-CM) - 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 Patient and his caretaker both say they can tell he is  getting stronger and is able to stand for longer.  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 No  PRECAUTIONS: Fall  WEIGHT BEARING RESTRICTIONS: No  FALLS: Has patient fallen in last 6 months? No; FOF (+), not hesitant to leave the house    OBJECTIVE:  Functional test: 5x STS 05/26/22: 1 min 14 sec 5x STS 06/11/22: 32 sec no UE support 5x STS 07/06/22: 42 sec  TUG (1/24): 1:27 BERG BALANCE TEST Sitting to Standing: 4.      Stands without using hands and stabilize independently  Standing Unsupported: 3.      Stands 2 minutes with supervision  Sitting Unsupported: 4.     Sits for 2 minutes independently  Standing to Sitting: 4.     Sits safely with minimal use of hands  Transfers: 3.     Transfers safely definite use of hands 3 Standing with eyes closed: 3.     Stands 10 seconds with supervision  Standing with feet together: 3.     Stands for 1 minute with supervision Reaching forward with outstretched arm: 1.     Reaches forward with supervision  Retrieving object from the floor: 1.     Unable to pick up and needs supervision Turning to look behind: 3.     Looks behind one side only, other side less weight shift Turning 360 degrees: 1.     Needs supervision or verbal cueing Place alternate foot on stool: 1.     Completes >2 steps with minimal assist Standing with one foot in front: 0.     Loses balance while standing/stepping Standing on one foot: 0.     Unable  Total Score: 31/56   TODAY'S TREATMENT:    [facility] Adult PT Treatment:  DATE: 07/24/2022 Therapeutic Exercise: Mat Table: S/L clamshells RTB x15 S/L straight leg hip abd RTB x10 S/L hip flexor stretch (passive) LTR x10 Bridges w/ball squeeze x10 HS stretch with strap B Seated: Forward bend + overhead  reach (thoracic extension) Trunk rotation Forward bend --> overhead reach --> side arm reach x5 Counter: Heel raises x10 Gastroc stretch B    [facility] Adult PT Treatment:                                                DATE: 07/20/2022 Therapeutic Activity: Seated: Alt forward bending + thoracic extension (therapist back to back to provide trunk support)  Lateral side bend w/hand behind head Side Lying: Straight leg hip abd --> leg raises front/back Bridges with isometric hip abd Standing (2#DB): Forward punch reaching beyond BOS Overhead punch (looking up with punch) STS --> hip hinge mechanics and chest lift to upright standing posture Stairs (4" side): Alt toe taps Runner's lunge stretch (foot on top step) Forward stepping up --> Backwards stepping down Counter: Tree surgeon with opposite hand placed above on cabinet  Overhead hand placement on cabinet doors: standing backbend stretch    PATIENT EDUCATION: Education details: Practicing standing balance putting cups/dishes in top cabinets Person educated: Patient and Caregiver   Education method: Explanation Education comprehension: verbalized understanding  HOME EXERCISE PROGRAM: Access Code: [email or web address] URL: [email or web address] Date: 07/24/2022 Prepared by: Helane Gunther  Exercises - [place]  - 1 x daily - 7 x weekly - 3 sets - 10 reps - Clam with Resistance  - 1 x daily - 7 x weekly - 3 sets - 10 reps - Sidelying Hip Abduction and Extension with Loop Band  - 1 x daily - 7 x weekly - 3 sets - 10 reps - Lower Trunk Rotations  - 1 x daily - 7 x weekly - 3 sets - 10 reps - Supine Bridge with Mini Swiss Ball Between Knees  - 1 x daily - 7 x weekly - 3 sets - 10 reps - Supine Hamstring Stretch with Strap  - 1 x daily - 7 x weekly - 3 sets - 10 reps - Seated Reach Forward, Up, and To Sides  - 1 x daily - 7 x weekly - 3 sets - 10 reps - Seated Reaching to Side and Across Body  - 2 x daily - 7  x weekly - 2 sets - 10 reps - Sit to Stand with Counter Support  - 1 x daily - 7 x weekly - 3 sets - 10 reps - Seated Shoulder Row with Anchored Resistance  - 1 x daily - 7 x weekly - 3 sets - 10 reps - Seated Anti-Rotation Press With Anchored Resistance  - 1 x daily - 7 x weekly - 3 sets - 10 reps - Seated Trunk Rotation with Anchored Resistance  - 1 x daily - 7 x weekly - 3 sets - 10 reps - Gastroc Stretch on Wall  - 2 x daily - 7 x weekly - 1 sets - 4 reps - 30 sec hold - Standing Heel Raise with Support  - 1 x daily - 7 x weekly - 3 sets - 10 reps - Side to Side Weight Shift with Overhead Reach and Counter Support  - 2 x daily - 7 x weekly - 2 sets - 10  reps - Alternating Over Head Reach  - 1 x daily - 7 x weekly - 3 sets - 10 reps  GOALS: Goals reviewed with patient? Yes  SHORT TERM GOALS: Target date: 05/27/2022  Will be compliant with appropriate HEP with no more than MinA from caregiver  Baseline: Goal status: MET   2.  Will be able to verbalize at least 3 ways to reduce fall risk at home and in the community  Baseline:  Goal status: MET   3.  Will be able to complete 5x sit to stand in 20 seconds or less with no posterior LOB to show improved functional mobility  Baseline: 1 min 15 sec 05/26/22 Goal status: IN PROGRESS 12/28:IN PROGRESS 06/19/2022: PROGRESSING (58mn 4 sec) 07/06/22: PROGRESSING (33 sec)  4.  Will be able to ambulate at least 1517fwith no rest breaks and LRAD  Baseline: 2x75' with one seated rest break using RW 05/26/22 Goal status: IN PROGRESS 12/28: PARTIALLY MET 06/19/2022: MET (160' RW)    LONG TERM GOALS: Target date: 08/05/2022  Will be able to complete TUG in 40 seconds or less with LRAD and no more than Min guard to show improved balance/mobility  Baseline:  Goal status: IN PROGRESS 07/06/22: 1 min 22 sec  2.  Pt will increase BERG balance score to >/=30/56 to demonstrate improved static balance. Baseline: 19/56 (12/4) Goal status: IN  PROGRESS 07/08/22: 31/56  3.  Will be able to navigate single step with LRAD and no more than Min guard for safety  Baseline:  Goal status: MET   4.  Pain in R ankle to be no more than 5/10 at worst  Baseline:  Goal status: MET no pain  5.  Will be able to ambulate at least 22553fith LRAD, no rest breaks, and Min guard to show improved functional mobility  Baseline:  Goal status: MET     ASSESSMENT:  CLINICAL IMPRESSION:  Patient able to perform bed mobility transfers independently. Trunk and pelvic mobility exercises continued to promote reciprocal gait patterning. Increased range noted during standing heel raises; cueing decreased forward weight shifting compensation.    OBJECTIVE IMPAIRMENTS: Abnormal gait, decreased activity tolerance, decreased balance, decreased coordination, decreased knowledge of use of DME, decreased mobility, difficulty walking, decreased strength, and pain.    PLAN:  PT FREQUENCY: 2x/week  PT DURATION: 6 weeks  PLANNED INTERVENTIONS: Therapeutic exercises, Therapeutic activity, Neuromuscular re-education, Balance training, Gait training, Patient/Family education, Self Care, Joint mobilization, Stair training, DME instructions, Aquatic Therapy, Wheelchair mobility training, Biofeedback, Ionotophoresis '4mg'$ /ml Dexamethasone, Manual therapy, and Re-evaluation  PLAN FOR NEXT SESSION: Dynamic standing balance; postural& core/hip strengthening, pelvic/trunk mobility.  KatHardin NegusTA 07/24/2022, 11:55 AM

## 2022-07-27 ENCOUNTER — Ambulatory Visit: Payer: Medicare Other

## 2022-07-27 DIAGNOSIS — R2681 Unsteadiness on feet: Secondary | ICD-10-CM

## 2022-07-27 DIAGNOSIS — R2689 Other abnormalities of gait and mobility: Secondary | ICD-10-CM | POA: Diagnosis not present

## 2022-07-27 DIAGNOSIS — R260 Ataxic gait: Secondary | ICD-10-CM | POA: Diagnosis not present

## 2022-07-27 DIAGNOSIS — R262 Difficulty in walking, not elsewhere classified: Secondary | ICD-10-CM | POA: Diagnosis not present

## 2022-07-27 NOTE — Therapy (Signed)
OUTPATIENT PHYSICAL THERAPY NEURO TREATMENT   Patient Name: Justin [name] MRN: [id number] DOB:Dec 07, 1940, 82 y.o., male Today's Date: 07/27/2022  PCP: Justin [name]  REFERRING PROVIDER: [name], NP    END OF SESSION:  PT End of Session - 07/27/22 1418     Visit Number 26    Number of Visits 29    Date for PT Re-Evaluation 08/05/22    Authorization Type BCBS MCR    PT Start Time 1400    PT Stop Time [id number]    PT Time Calculation (min) 45 min    Activity Tolerance Patient tolerated treatment well    Behavior During Therapy [facility] for tasks assessed/performed                 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   Diaphragmatic hernia without mention of obstruction or gangrene 1982   Hiatal hernia   Diplopia 2006   Disturbance of skin sensation 2003   Diverticulosis of colon (without mention of hemorrhage) 1997   Dizziness and giddiness 2001   Dysphagia, unspecified(787.20) 1999 and   Esophagitis, unspecified 1997   GERD (gastroesophageal reflux disease) 2003   Hypertension 2003   Impotence of organic origin 1999   Lack of coordination September 07, 2007   Lumbago 2003   Migraine with aura, without mention of intractable migraine without mention of status migrainosus 1997   Myalgia and myositis, unspecified 1999   Other and unspecified hyperlipidemia 2003   Other disorders of vitreous 2003   Other malaise and fatigue 2003   Other seborrheic keratosis 2013   Other seborrheic keratosis 04/13/09   Pain in joint, site unspecified 04/13/12   Personal history of fall 04/15/11   Restless legs syndrome (RLS) 1997   Spinocerebellar disease, 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 Donne [name]   C3-4 anterior cervical surgery  1999   Dr. Hal [name]   CARDIAC CATHETERIZATION  2001   Dr. Glade [name]   COLONOSCOPY  07/20/07   Dr. 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   Internal hemorrhoids without complication [id number]   Arthritis 02/21/2014   Dysarthria 02/21/2014   Seborrheic keratosis 02/21/2014   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   Tinnitus 04/12/2013   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[place]) 10/16/2012   Impotence, 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   Essential hypertension    GERD (gastroesophageal reflux disease)    Dyslipidemia    Lumbago    Dysphagia     ONSET DATE: 04/17/2022  REFERRING DIAG:  G11.9 (ICD-10-CM) - 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 Patient states he low back is feeling less sore.    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 No  PRECAUTIONS: Fall  WEIGHT BEARING RESTRICTIONS: No  FALLS: Has patient fallen in last 6 months? No; FOF (+), not hesitant to leave the house    OBJECTIVE:  Functional test: 5x STS 05/26/22: 1 min 14 sec 5x STS 06/11/22: 32 sec no UE support 5x STS 07/06/22: 42 sec  TUG (1/24): 1:27 BERG BALANCE TEST Sitting to Standing: 4.      Stands without using hands and stabilize independently  Standing Unsupported: 3.      Stands 2 minutes with supervision  Sitting Unsupported: 4.     Sits for 2 minutes independently  Standing to Sitting: 4.     Sits safely with minimal use of hands  Transfers: 3.     Transfers safely definite use of hands 3 Standing with eyes closed: 3.     Stands 10 seconds with supervision  Standing with feet together: 3.     Stands for 1 minute with supervision Reaching forward with outstretched arm: 1.     Reaches forward with supervision  Retrieving object from the floor: 1.     Unable to pick up and needs supervision Turning to look behind: 3.     Looks behind one side only, other side less weight shift Turning 360 degrees: 1.     Needs supervision or verbal cueing Place alternate foot on stool: 1.     Completes >2 steps with minimal assist Standing with one foot in front: 0.     Loses balance while standing/stepping Standing on one foot: 0.     Unable  Total Score: 31/56   TODAY'S TREATMENT:    [facility] Adult PT Treatment:  DATE: 07/27/2022 Therapeutic Exercise: Mat Table: S/L clamshells RTB 2x10 S/L straight leg hip abd RTB 2x10 LTR x10 Bridges w/ isometric hip abd x5 --> ball squeeze x5 --> modified SL bridge (foot propped on yoga block) x5, x8 Therapeutic Activity: Counter: Standing spinal  extension for posture and dynamic balance Alt overhead reaches against cabinets: same side & crossing midline Lateral flexion + overhead arm reach  Standing paloff press RTB (bracing hip against counter) x10 B Standing (FWW in front, CGA) reching beyond BOS 1#MB forward reaching    [facility] Adult PT Treatment:                                                DATE: 07/24/2022 Therapeutic Exercise: Mat Table: S/L clamshells RTB x15 S/L straight leg hip abd RTB x10 S/L hip flexor stretch (passive) LTR x10 Bridges w/ball squeeze x10 HS stretch with strap B Seated: Forward bend + overhead reach (thoracic extension) Trunk rotation Forward bend --> overhead reach --> side arm reach x5 Counter: Heel raises x10 Gastroc stretch B   PATIENT EDUCATION: Education details: Teacher, music goods in Publishing rights manager Person educated: Patient and Caregiver   Education method: Explanation Education comprehension: verbalized understanding  HOME EXERCISE PROGRAM: Access Code: [email or web address] URL: [email or web address] Date: 07/24/2022 Prepared by: Helane Gunther  Exercises - [place]  - 1 x daily - 7 x weekly - 3 sets - 10 reps - Clam with Resistance  - 1 x daily - 7 x weekly - 3 sets - 10 reps - Sidelying Hip Abduction and Extension with Loop Band  - 1 x daily - 7 x weekly - 3 sets - 10 reps - Lower Trunk Rotations  - 1 x daily - 7 x weekly - 3 sets - 10 reps - Supine Bridge with Mini Swiss Ball Between Knees  - 1 x daily - 7 x weekly - 3 sets - 10 reps - Supine Hamstring Stretch with Strap  - 1 x daily - 7 x weekly - 3 sets - 10 reps - Seated Reach Forward, Up, and To Sides  - 1 x daily - 7 x weekly - 3 sets - 10 reps - Seated Reaching to Side and Across Body  - 2 x daily - 7 x weekly - 2 sets - 10 reps - Sit to Stand with Counter Support  - 1 x daily - 7 x weekly - 3 sets - 10 reps - Seated Shoulder Row with Anchored Resistance  - 1 x daily - 7 x weekly - 3 sets - 10 reps -  Seated Anti-Rotation Press With Anchored Resistance  - 1 x daily - 7 x weekly - 3 sets - 10 reps - Seated Trunk Rotation with Anchored Resistance  - 1 x daily - 7 x weekly - 3 sets - 10 reps - Gastroc Stretch on Wall  - 2 x daily - 7 x weekly - 1 sets - 4 reps - 30 sec hold - Standing Heel Raise with Support  - 1 x daily - 7 x weekly - 3 sets - 10 reps - Side to Side Weight Shift with Overhead Reach and Counter Support  - 2 x daily - 7 x weekly - 2 sets - 10 reps - Alternating Over Head Reach  - 1 x daily - 7 x weekly - 3 sets - 10 reps  GOALS: Goals reviewed with patient? Yes  SHORT TERM GOALS: Target date: 05/27/2022  Will be compliant with appropriate HEP with no more than MinA from caregiver  Baseline: Goal status: MET   2.  Will be able to verbalize at least 3 ways to reduce fall risk at home and in the community  Baseline:  Goal status: MET   3.  Will be able to complete 5x sit to stand in 20 seconds or less with no posterior LOB to show improved functional mobility  Baseline: 1 min 15 sec 05/26/22 Goal status: IN PROGRESS 12/28:IN PROGRESS 06/19/2022: PROGRESSING (81mn 4 sec) 07/06/22: PROGRESSING (33 sec)  4.  Will be able to ambulate at least 1583fwith no rest breaks and LRAD  Baseline: 2x75' with one seated rest break using RW 05/26/22 Goal status: IN PROGRESS 12/28: PARTIALLY MET 06/19/2022: MET (160' RW)    LONG TERM GOALS: Target date: 08/05/2022  Will be able to complete TUG in 40 seconds or less with LRAD and no more than Min guard to show improved balance/mobility  Baseline:  Goal status: IN PROGRESS 07/06/22: 1 min 22 sec  2.  Pt will increase BERG balance score to >/=30/56 to demonstrate improved static balance. Baseline: 19/56 (12/4) Goal status: IN PROGRESS 07/08/22: 31/56  3.  Will be able to navigate single step with LRAD and no more than Min guard for safety  Baseline:  Goal status: MET   4.  Pain in R ankle to be no more than 5/10 at worst   Baseline:  Goal status: MET no pain  5.  Will be able to ambulate at least 22554fith LRAD, no rest breaks, and Min guard to show improved functional mobility  Baseline:  Goal status: MET     ASSESSMENT:  CLINICAL IMPRESSION:  Dynamic balance and postural stability challenged with forward reaching beyond base of support; cueing for hip flexion improved hip strategy dynamics. Postural stretches continued in standing with reaching overhead, across midline and lateral trunk flexion.   OBJECTIVE IMPAIRMENTS: Abnormal gait, decreased activity tolerance, decreased balance, decreased coordination, decreased knowledge of use of DME, decreased mobility, difficulty walking, decreased strength, and pain.    PLAN:  PT FREQUENCY: 2x/week  PT DURATION: 6 weeks  PLANNED INTERVENTIONS: Therapeutic exercises, Therapeutic activity, Neuromuscular re-education, Balance training, Gait training, Patient/Family education, Self Care, Joint mobilization, Stair training, DME instructions, Aquatic Therapy, Wheelchair mobility training, Biofeedback, Ionotophoresis '4mg'$ /ml Dexamethasone, Manual therapy, and Re-evaluation  PLAN FOR NEXT SESSION: Dynamic standing balance; postural& core/hip strengthening, pelvic/trunk mobility.  KatHardin NegusTA 07/27/2022, 2:46 PM

## 2022-07-29 ENCOUNTER — Ambulatory Visit: Payer: Medicare Other

## 2022-07-29 DIAGNOSIS — R2689 Other abnormalities of gait and mobility: Secondary | ICD-10-CM

## 2022-07-29 DIAGNOSIS — R2681 Unsteadiness on feet: Secondary | ICD-10-CM

## 2022-07-29 DIAGNOSIS — R262 Difficulty in walking, not elsewhere classified: Secondary | ICD-10-CM | POA: Diagnosis not present

## 2022-07-29 DIAGNOSIS — R260 Ataxic gait: Secondary | ICD-10-CM

## 2022-07-29 NOTE — Therapy (Signed)
OUTPATIENT PHYSICAL THERAPY NEURO TREATMENT   Patient Name: Justin [name] MRN: [id number] DOB:03/10/1941, 82 y.o., male Today's Date: 07/29/2022  PCP: Sherrie Mustache  REFERRING PROVIDER: [name], NP    END OF SESSION:  PT End of Session - 07/29/22 1413     Visit Number 27    Number of Visits 29    Date for PT Re-Evaluation 08/05/22    Authorization Type BCBS MCR    PT Start Time 1400    PT Stop Time [id number]    PT Time Calculation (min) 45 min    Activity Tolerance Patient tolerated treatment well    Behavior During Therapy [facility] for tasks assessed/performed                 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   Diaphragmatic hernia without mention of obstruction or gangrene 1982   Hiatal hernia   Diplopia 2006   Disturbance of skin sensation 2003   Diverticulosis of colon (without mention of hemorrhage) 1997   Dizziness and giddiness 2001   Dysphagia, unspecified(787.20) 1999 and   Esophagitis, unspecified 1997   GERD (gastroesophageal reflux disease) 2003   Hypertension 2003   Impotence of organic origin 1999   Lack of coordination September 07, 2007   Lumbago 2003   Migraine with aura, without mention of intractable migraine without mention of status migrainosus 1997   Myalgia and myositis, unspecified 1999   Other and unspecified hyperlipidemia 2003   Other disorders of vitreous 2003   Other malaise and fatigue 2003   Other seborrheic keratosis 2013   Other seborrheic keratosis 04/13/09   Pain in joint, site unspecified 04/13/12   Personal history of fall 04/15/11   Restless legs syndrome (RLS) 1997   Spinocerebellar disease, 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 Donne Hazel   C3-4 anterior cervical surgery  1999   Dr. Hal Neer   CARDIAC CATHETERIZATION  2001   Dr. Glade Lloyd   COLONOSCOPY  07/20/07   Dr. 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[place]) 12/21/2016   Urgency incontinence 04/01/2016   History of fall 08/21/2015   Fungal dermatosis 02/20/2015   SCC (squamous cell carcinoma), 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   Internal hemorrhoids without complication 123456   Arthritis 02/21/2014   Dysarthria 02/21/2014   Seborrheic keratosis 02/21/2014   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   Tinnitus 04/12/2013   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[place]) 10/16/2012   Impotence, 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   Essential hypertension    GERD (gastroesophageal reflux disease)    Dyslipidemia    Lumbago    Dysphagia     ONSET DATE: 04/17/2022  REFERRING DIAG:  G11.9 (ICD-10-CM) - 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 Patient states he is feeling slightly  more unsteady today than usual.  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 No  PRECAUTIONS: Fall  WEIGHT BEARING RESTRICTIONS: No  FALLS: Has patient fallen in last 6 months? No; FOF (+), not hesitant to leave the house    OBJECTIVE:  Functional test: 5x STS 05/26/22: 1 min 14 sec 5x STS 06/11/22: 32 sec no UE support 5x STS 07/06/22: 42 sec  TUG (1/24): 1:27 BERG BALANCE TEST Sitting to Standing: 4.      Stands without using hands and stabilize independently  Standing Unsupported: 3.      Stands 2 minutes with supervision  Sitting Unsupported: 4.     Sits for 2 minutes independently  Standing to Sitting: 4.     Sits safely with minimal use of hands  Transfers: 3.     Transfers safely definite use of hands 3 Standing with eyes closed: 3.     Stands 10 seconds with supervision  Standing with feet together: 3.     Stands for 1 minute with supervision Reaching forward with outstretched arm: 1.     Reaches forward with supervision  Retrieving object from the floor: 1.     Unable to pick up and needs supervision Turning to look behind: 3.     Looks behind one side only, other side less weight shift Turning 360 degrees: 1.     Needs supervision or verbal cueing Place alternate foot on stool: 1.     Completes >2 steps with minimal assist Standing with one foot in front: 0.     Loses balance while standing/stepping Standing on one foot: 0.     Unable  Total Score: 31/56   TODAY'S TREATMENT:    [facility] Adult PT Treatment:  DATE: 07/03/2022 Therapeutic Activity: Fwd amb close SBA x80' Standing UBE fwd & bkwd (close SBA) Mat Table: Open books (hand behind head)  S/L straight leg hip abd + ext x10 B S/L leg circles x10 Supine side scooting with bridging Modified thomas  stretch x1 min B Wall: standing unsupported (CGA) + reaching (numbers on wall) Seated thoracic extension over coregeous ball Bkwd walking (CGA) Seated deadlift 5#DB x10   [facility] Adult PT Treatment:                                                DATE: 07/27/2022 Therapeutic Exercise: Mat Table: S/L clamshells RTB 2x10 S/L straight leg hip abd RTB 2x10 LTR x10 Bridges w/ isometric hip abd x5 --> ball squeeze x5 --> modified SL bridge (foot propped on yoga block) x5, x8 Therapeutic Activity: Counter: Standing spinal extension for posture and dynamic balance Alt overhead reaches against cabinets: same side & crossing midline Lateral flexion + overhead arm reach  Standing paloff press RTB (bracing hip against counter) x10 B Standing (FWW in front, CGA) reching beyond BOS 1#MB forward reaching   PATIENT EDUCATION: 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 Person educated: Patient and Caregiver   Education method: Explanation Education comprehension: verbalized understanding  HOME EXERCISE PROGRAM: Access Code: [email or web address] URL: [email or web address] Date: 07/24/2022 Prepared by: Helane Gunther  Exercises - [place]  - 1 x daily - 7 x weekly - 3 sets - 10 reps - Clam with Resistance  - 1 x daily - 7 x weekly - 3 sets - 10 reps - Sidelying Hip Abduction and Extension with Loop Band  - 1 x daily - 7 x weekly - 3 sets - 10 reps - Lower Trunk Rotations  - 1 x daily - 7 x weekly - 3 sets - 10 reps - Supine Bridge with Mini Swiss Ball Between Knees  - 1 x daily - 7 x weekly - 3 sets - 10 reps - Supine Hamstring Stretch with Strap  - 1 x daily - 7 x weekly - 3 sets - 10 reps - Seated Reach Forward, Up, and To Sides  - 1 x daily - 7 x weekly - 3 sets - 10 reps - Seated Reaching to Side and Across Body  - 2 x daily - 7 x weekly - 2 sets - 10 reps - Sit to Stand with Counter Support  - 1 x daily - 7 x weekly - 3 sets - 10 reps - Seated Shoulder Row with  Anchored Resistance  - 1 x daily - 7 x weekly - 3 sets - 10 reps - Seated Anti-Rotation Press With Anchored Resistance  - 1 x daily - 7 x weekly - 3 sets - 10 reps - Seated Trunk Rotation with Anchored Resistance  - 1 x daily - 7 x weekly - 3 sets - 10 reps - Gastroc Stretch on Wall  - 2 x daily - 7 x weekly - 1 sets - 4 reps - 30 sec hold - Standing Heel Raise with Support  - 1 x daily - 7 x weekly - 3 sets - 10 reps - Side to Side Weight Shift with Overhead Reach and Counter Support  - 2 x daily - 7 x weekly - 2 sets - 10 reps - Alternating Over Head Reach  - 1 x daily -  7 x weekly - 3 sets - 10 reps  GOALS: Goals reviewed with patient? Yes  SHORT TERM GOALS: Target date: 05/27/2022  Will be compliant with appropriate HEP with no more than MinA from caregiver  Baseline: Goal status: MET   2.  Will be able to verbalize at least 3 ways to reduce fall risk at home and in the community  Baseline:  Goal status: MET   3.  Will be able to complete 5x sit to stand in 20 seconds or less with no posterior LOB to show improved functional mobility  Baseline: 1 min 15 sec 05/26/22 Goal status: IN PROGRESS 12/28:IN PROGRESS 06/19/2022: PROGRESSING (46mn 4 sec) 07/06/22: PROGRESSING (33 sec)  4.  Will be able to ambulate at least 1588fwith no rest breaks and LRAD  Baseline: 2x75' with one seated rest break using RW 05/26/22 Goal status: IN PROGRESS 12/28: PARTIALLY MET 06/19/2022: MET (160' RW)    LONG TERM GOALS: Target date: 08/05/2022  Will be able to complete TUG in 40 seconds or less with LRAD and no more than Min guard to show improved balance/mobility  Baseline:  Goal status: IN PROGRESS 07/06/22: 1 min 22 sec  2.  Pt will increase BERG balance score to >/=30/56 to demonstrate improved static balance. Baseline: 19/56 (12/4) Goal status: IN PROGRESS (31/56) on 07/08/22  3.  Will be able to navigate single step with LRAD and no more than Min guard for safety  Baseline:  Goal status:  MET   4.  Pain in R ankle to be no more than 5/10 at worst  Baseline:  Goal status: MET no pain  5.  Will be able to ambulate at least 2256fith LRAD, no rest breaks, and Min guard to show improved functional mobility  Baseline:  Goal status: MET     ASSESSMENT:  CLINICAL IMPRESSION:  Mild-mod postural sway demonstrated during standing unsupported balance activities (CGA); patient continues to demonstrate increased forward flexed hip posture with prolonged time in standing. Postural strengthening and dynamic balance progressed with standing UBE warm-up. Posterior chain strengthening challenged with resisted seated dead lifts.    OBJECTIVE IMPAIRMENTS: Abnormal gait, decreased activity tolerance, decreased balance, decreased coordination, decreased knowledge of use of DME, decreased mobility, difficulty walking, decreased strength, and pain.    PLAN:  PT FREQUENCY: 2x/week  PT DURATION: 6 weeks  PLANNED INTERVENTIONS: Therapeutic exercises, Therapeutic activity, Neuromuscular re-education, Balance training, Gait training, Patient/Family education, Self Care, Joint mobilization, Stair training, DME instructions, Aquatic Therapy, Wheelchair mobility training, Biofeedback, Ionotophoresis '4mg'$ /ml Dexamethasone, Manual therapy, and Re-evaluation  PLAN FOR NEXT SESSION: Dynamic standing balance; postural& core/hip strengthening, pelvic/trunk mobility.  KatHardin NegusTA 07/29/2022, 2:46 PM

## 2022-08-03 ENCOUNTER — Ambulatory Visit: Payer: Medicare Other | Attending: Nurse Practitioner

## 2022-08-03 DIAGNOSIS — R260 Ataxic gait: Secondary | ICD-10-CM | POA: Diagnosis not present

## 2022-08-03 DIAGNOSIS — R262 Difficulty in walking, not elsewhere classified: Secondary | ICD-10-CM | POA: Insufficient documentation

## 2022-08-03 DIAGNOSIS — R2689 Other abnormalities of gait and mobility: Secondary | ICD-10-CM | POA: Insufficient documentation

## 2022-08-03 DIAGNOSIS — R2681 Unsteadiness on feet: Secondary | ICD-10-CM | POA: Diagnosis not present

## 2022-08-03 NOTE — Therapy (Addendum)
OUTPATIENT PHYSICAL THERAPY NEURO TREATMENT   Patient Name: Justin [name] MRN: [id number] DOB:04-04-1941, 82 y.o., male Today's Date: 08/03/2022  PCP: Sherrie Mustache  REFERRING PROVIDER: [name], NP    END OF SESSION:  PT End of Session - 08/03/22 1448     Visit Number 28    Number of Visits 29    Date for PT Re-Evaluation 08/05/22    Authorization Type BCBS MCR    PT Start Time 1448    PT Stop Time [id number]    PT Time Calculation (min) 46 min                 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   Diaphragmatic hernia without mention of obstruction or gangrene 1982   Hiatal hernia   Diplopia 2006   Disturbance of skin sensation 2003   Diverticulosis of colon (without mention of hemorrhage) 1997   Dizziness and giddiness 2001   Dysphagia, unspecified(787.20) 1999 and   Esophagitis, unspecified 1997   GERD (gastroesophageal reflux disease) 2003   Hypertension 2003   Impotence of organic origin 1999   Lack of coordination September 07, 2007   Lumbago 2003   Migraine with aura, without mention of intractable migraine without mention of status migrainosus 1997   Myalgia and myositis, unspecified 1999   Other and unspecified hyperlipidemia 2003   Other disorders of vitreous 2003   Other malaise and fatigue 2003   Other seborrheic keratosis 2013   Other seborrheic keratosis 04/13/09   Pain in joint, site unspecified 04/13/12   Personal history of fall 04/15/11   Restless legs syndrome (RLS) 1997   Spinocerebellar disease, 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 Donne Hazel   C3-4 anterior cervical surgery  1999   Dr. Hal Neer   CARDIAC CATHETERIZATION  2001   Dr. Glade Lloyd   COLONOSCOPY  07/20/07   Dr. 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   Internal hemorrhoids without complication [id number]   Arthritis 02/21/2014   Dysarthria 02/21/2014   Seborrheic keratosis 02/21/2014   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   Tinnitus 04/12/2013   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[place]) 10/16/2012   Impotence, 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   Essential hypertension    GERD (gastroesophageal reflux disease)    Dyslipidemia    Lumbago    Dysphagia     ONSET DATE: 04/17/2022  REFERRING DIAG: G11.9 (ICD-10-CM) - 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[place]) R26.89 (ICD-10-CM) - Imbalance  THERAPY DIAG:  Difficulty in walking, not elsewhere  classified  Ataxic gait  Other abnormalities of gait and mobility  Unsteadiness on feet  Rationale for Evaluation and Treatment: Rehabilitation  SUBJECTIVE:                                                                                                                                                                                             SUBJECTIVE STATEMENT: Patient reports he performed supine exercises on his own in bed this morning. Patient states he is feeling slightly more unsteady today than usual.  Pt accompanied by:  caregiver  [name]  PERTINENT HISTORY:  Spinocerebellar disease  ([place]) -progressive, continues with supportive care - Ambulatory referral to Physical Therapy      PAIN:  Are you having pain? No  PRECAUTIONS: Fall  WEIGHT BEARING RESTRICTIONS: No  FALLS: Has patient fallen in last 6 months? No; FOF (+), not hesitant to leave the house    OBJECTIVE:  Functional test: 5x STS 05/26/22: 1 min 14 sec 5x STS 06/11/22: 32 sec no UE support 5x STS 07/06/22: 42 sec  TUG (1/24): 1:27 BERG BALANCE TEST Sitting to Standing: 4.      Stands without using hands and stabilize independently  Standing Unsupported: 3.      Stands 2 minutes with supervision  Sitting Unsupported: 4.     Sits for 2 minutes independently  Standing to Sitting: 4.     Sits safely with minimal use of hands  Transfers: 3.     Transfers safely definite use of hands 3 Standing with eyes closed: 3.     Stands 10 seconds with supervision  Standing with feet together: 3.     Stands for 1 minute with supervision Reaching forward with outstretched arm: 1.     Reaches forward with supervision  Retrieving object from the floor: 1.     Unable to pick up and needs supervision Turning to look behind: 3.     Looks behind one side only, other side less weight shift Turning 360 degrees: 1.     Needs supervision or verbal cueing Place alternate foot on stool: 1.     Completes >2 steps with minimal assist Standing with one foot in front: 0.     Loses balance while standing/stepping Standing on one foot: 0.     Unable  Total Score: 31/56   TODAY'S TREATMENT:    [facility] Adult PT Treatment:  DATE: 08/03/2022 Therapeutic Activity: Fwd amb close SBA x80' Standing UBE fwd (close SBA) x 57mn Fwd/bkwd stepping 6" steps Lateral step down/up 4" step Side scooting edge of table (anterior weight shifting mechanics) Quadruped trunk flexion   [facility] Adult PT Treatment:                                                DATE: 07/03/2022 Therapeutic Activity: Fwd amb close  SBA x80' Standing UBE fwd & bkwd (close SBA) Mat Table: Open books (hand behind head)  S/L straight leg hip abd + ext x10 B S/L leg circles x10 Supine side scooting with bridging Modified thomas stretch x1 min B Wall: standing unsupported (CGA) + reaching (numbers on wall) Seated thoracic extension over coregeous ball Bkwd walking (CGA) Seated deadlift 5#DB x10   [facility] Adult PT Treatment:                                                DATE: 07/27/2022 Therapeutic Exercise: Mat Table: S/L clamshells RTB 2x10 S/L straight leg hip abd RTB 2x10 LTR x10 Bridges w/ isometric hip abd x5 --> ball squeeze x5 --> modified SL bridge (foot propped on yoga block) x5, x8 Therapeutic Activity: Counter: Standing spinal extension for posture and dynamic balance Alt overhead reaches against cabinets: same side & crossing midline Lateral flexion + overhead arm reach  Standing paloff press RTB (bracing hip against counter) x10 B Standing (FWW in front, CGA) reching beyond BOS 1#MB forward reaching   PATIENT EDUCATION: Education details: Placing canned goods in oPublishing rights managerPerson educated: Patient and Caregiver   Education method: Explanation Education comprehension: verbalized understanding  HOME EXERCISE PROGRAM: Access Code: [email or web address]: [email or web address] Date: 07/24/2022 Prepared by: KHelane Gunther Exercises - [place] - 1 x daily - 7 x weekly - 3 sets - 10 reps - Clam with Resistance  - 1 x daily - 7 x weekly - 3 sets - 10 reps - Sidelying Hip Abduction and Extension with Loop Band  - 1 x daily - 7 x weekly - 3 sets - 10 reps - Lower Trunk Rotations  - 1 x daily - 7 x weekly - 3 sets - 10 reps - Supine Bridge with Mini Swiss Ball Between Knees  - 1 x daily - 7 x weekly - 3 sets - 10 reps - Supine Hamstring Stretch with Strap  - 1 x daily - 7 x weekly - 3 sets - 10 reps - Seated Reach Forward, Up, and To Sides  - 1 x daily - 7 x weekly - 3 sets - 10  reps - Seated Reaching to Side and Across Body  - 2 x daily - 7 x weekly - 2 sets - 10 reps - Sit to Stand with Counter Support  - 1 x daily - 7 x weekly - 3 sets - 10 reps - Seated Shoulder Row with Anchored Resistance  - 1 x daily - 7 x weekly - 3 sets - 10 reps - Seated Anti-Rotation Press With Anchored Resistance  - 1 x daily - 7 x weekly - 3 sets - 10 reps - Seated Trunk Rotation with Anchored Resistance  - 1 x daily - 7 x  weekly - 3 sets - 10 reps - Gastroc Stretch on Wall  - 2 x daily - 7 x weekly - 1 sets - 4 reps - 30 sec hold - Standing Heel Raise with Support  - 1 x daily - 7 x weekly - 3 sets - 10 reps - Side to Side Weight Shift with Overhead Reach and Counter Support  - 2 x daily - 7 x weekly - 2 sets - 10 reps - Alternating Over Head Reach  - 1 x daily - 7 x weekly - 3 sets - 10 reps  GOALS: Goals reviewed with patient? Yes  SHORT TERM GOALS: Target date: 05/27/2022  Will be compliant with appropriate HEP with no more than MinA from caregiver  Baseline: Goal status: MET   2.  Will be able to verbalize at least 3 ways to reduce fall risk at home and in the community  Baseline:  Goal status: MET   3.  Will be able to complete 5x sit to stand in 20 seconds or less with no posterior LOB to show improved functional mobility  Baseline: 1 min 15 sec 05/26/22 Goal status: IN PROGRESS 12/28:IN PROGRESS 06/19/2022: PROGRESSING (31mn 4 sec) 07/06/22: PROGRESSING (33 sec)  4.  Will be able to ambulate at least 1586fwith no rest breaks and LRAD  Baseline: 2x75' with one seated rest break using RW 05/26/22 Goal status: IN PROGRESS 12/28: PARTIALLY MET 06/19/2022: MET (160' RW)    LONG TERM GOALS: Target date: 08/05/2022  Will be able to complete TUG in 40 seconds or less with LRAD and no more than Min guard to show improved balance/mobility  Baseline:  Goal status: IN PROGRESS 07/06/22: 1 min 22 sec  2.  Pt will increase BERG balance score to >/=30/56 to demonstrate improved  static balance. Baseline: 19/56 (12/4) Goal status: IN PROGRESS (31/56) on 07/08/22  3.  Will be able to navigate single step with LRAD and no more than Min guard for safety  Baseline:  Goal status: MET   4.  Pain in R ankle to be no more than 5/10 at worst  Baseline:  Goal status: MET no pain  5.  Will be able to ambulate at least 22568fith LRAD, no rest breaks, and Min guard to show improved functional mobility  Baseline:  Goal status: MET     ASSESSMENT:  CLINICAL IMPRESSION:  Cueing provided to increase anterior weight shifting during stair navigation. Patient continues to demonstrate forward flexed hip posture; upright postural alignment improves however has difficulty maintaining posture due to weakness. Side scooting on edge of table continued to progress patient's confidence and strength with anterior weight shifting to improve body mechanics with sit to stance transfers.   OBJECTIVE IMPAIRMENTS: Abnormal gait, decreased activity tolerance, decreased balance, decreased coordination, decreased knowledge of use of DME, decreased mobility, difficulty walking, decreased strength, and pain.    PLAN:  PT FREQUENCY: 2x/week  PT DURATION: 6 weeks  PLANNED INTERVENTIONS: Therapeutic exercises, Therapeutic activity, Neuromuscular re-education, Balance training, Gait training, Patient/Family education, Self Care, Joint mobilization, Stair training, DME instructions, Aquatic Therapy, Wheelchair mobility training, Biofeedback, Ionotophoresis '4mg'$ /ml Dexamethasone, Manual therapy, and Re-evaluation  PLAN FOR NEXT SESSION: Re-eval next visit  KatHardin NegusTA 08/03/2022, 4:29 PM

## 2022-08-05 ENCOUNTER — Other Ambulatory Visit: Payer: Self-pay | Admitting: Nurse Practitioner

## 2022-08-05 DIAGNOSIS — R609 Edema, unspecified: Secondary | ICD-10-CM

## 2022-08-05 DIAGNOSIS — I10 Essential (primary) hypertension: Secondary | ICD-10-CM

## 2022-08-06 ENCOUNTER — Other Ambulatory Visit: Payer: Self-pay | Admitting: Nurse Practitioner

## 2022-08-06 DIAGNOSIS — Z961 Presence of intraocular lens: Secondary | ICD-10-CM | POA: Diagnosis not present

## 2022-08-06 DIAGNOSIS — H52223 Regular astigmatism, bilateral: Secondary | ICD-10-CM | POA: Diagnosis not present

## 2022-08-06 DIAGNOSIS — H16221 Keratoconjunctivitis sicca, not specified as Sjogren's, right eye: Secondary | ICD-10-CM | POA: Diagnosis not present

## 2022-08-06 DIAGNOSIS — I10 Essential (primary) hypertension: Secondary | ICD-10-CM

## 2022-08-06 DIAGNOSIS — H26492 Other secondary cataract, left eye: Secondary | ICD-10-CM | POA: Diagnosis not present

## 2022-08-06 DIAGNOSIS — H524 Presbyopia: Secondary | ICD-10-CM | POA: Diagnosis not present

## 2022-08-06 DIAGNOSIS — H5203 Hypermetropia, bilateral: Secondary | ICD-10-CM | POA: Diagnosis not present

## 2022-08-06 DIAGNOSIS — R609 Edema, unspecified: Secondary | ICD-10-CM

## 2022-08-06 DIAGNOSIS — H43813 Vitreous degeneration, bilateral: Secondary | ICD-10-CM | POA: Diagnosis not present

## 2022-08-10 ENCOUNTER — Ambulatory Visit: Payer: Medicare Other

## 2022-08-10 DIAGNOSIS — R262 Difficulty in walking, not elsewhere classified: Secondary | ICD-10-CM

## 2022-08-10 DIAGNOSIS — R2689 Other abnormalities of gait and mobility: Secondary | ICD-10-CM | POA: Diagnosis not present

## 2022-08-10 DIAGNOSIS — R2681 Unsteadiness on feet: Secondary | ICD-10-CM | POA: Diagnosis not present

## 2022-08-10 DIAGNOSIS — R260 Ataxic gait: Secondary | ICD-10-CM

## 2022-08-10 NOTE — Therapy (Signed)
OUTPATIENT PHYSICAL THERAPY NEURO TREATMENT AND RECERTIFICATION   Patient Name: Justin [name] MRN: [id number] DOB:01-15-1941, 82 y.o., male Today's Date: 08/11/2022  PCP: Sherrie Mustache  REFERRING PROVIDER: [name], NP    END OF SESSION:  PT End of Session - 08/10/22 1402     Visit Number 29    Number of Visits 41    Date for PT Re-Evaluation 09/22/22    Authorization Type BCBS MCR    PT Start Time [id number]    PT Stop Time 1450    PT Time Calculation (min) 47 min    Activity Tolerance Patient tolerated treatment well    Behavior During Therapy [facility] for tasks assessed/performed                 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   Diaphragmatic hernia without mention of obstruction or gangrene 1982   Hiatal hernia   Diplopia 2006   Disturbance of skin sensation 2003   Diverticulosis of colon (without mention of hemorrhage) 1997   Dizziness and giddiness 2001   Dysphagia, unspecified(787.20) 1999 and   Esophagitis, unspecified 1997   GERD (gastroesophageal reflux disease) 2003   Hypertension 2003   Impotence of organic origin 1999   Lack of coordination September 07, 2007   Lumbago 2003   Migraine with aura, without mention of intractable migraine without mention of status migrainosus 1997   Myalgia and myositis, unspecified 1999   Other and unspecified hyperlipidemia 2003   Other disorders of vitreous 2003   Other malaise and fatigue 2003   Other seborrheic keratosis 2013   Other seborrheic keratosis 04/13/09   Pain in joint, site unspecified 04/13/12   Personal history of fall 04/15/11   Restless legs syndrome (RLS) 1997   Spinocerebellar  disease, 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 Donne Hazel   C3-4 anterior cervical surgery  1999   Dr. Hal Neer   CARDIAC CATHETERIZATION  2001   Dr. Glade Lloyd   COLONOSCOPY  07/20/07   Dr. 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[place]) 12/21/2016   Urgency incontinence 04/01/2016   History of fall 08/21/2015   Fungal dermatosis 02/20/2015   SCC (squamous cell carcinoma), 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   Internal hemorrhoids without complication 123456   Arthritis 02/21/2014   Dysarthria 02/21/2014   Seborrheic keratosis 02/21/2014   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   Tinnitus 04/12/2013   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[place]) 10/16/2012   Impotence, 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   Essential hypertension    GERD (gastroesophageal reflux disease)    Dyslipidemia    Lumbago    Dysphagia     ONSET DATE: 04/17/2022  REFERRING DIAG: G11.9 (ICD-10-CM) - 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[place]) R26.89 (ICD-10-CM) - Imbalance  THERAPY DIAG:  Difficulty in walking, not elsewhere classified  Other abnormalities of gait and mobility  Ataxic gait  Unsteadiness on feet  Rationale for Evaluation and Treatment: Rehabilitation  SUBJECTIVE:                                                                                                                                                                                             SUBJECTIVE STATEMENT: Patient reports he has had no falls since starting therapy, as well as no heel pain since he  received steroid injection in R heel (December). Patient is compliant and mostly independent with exercises; caretaker is close SBA with standing balance exercise. Patient states he feels his posture is improving and he had less low back pain over the weekend, that he has been moving more throughout the day.  Pt accompanied by:  caregiver  [name]  PERTINENT HISTORY:  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[place]) -progressive, continues with supportive care - Ambulatory referral to Physical Therapy      PAIN:  Are you having pain? No  PRECAUTIONS: Fall  WEIGHT BEARING RESTRICTIONS: No  FALLS: Has patient fallen in last 6 months? No; FOF (+), not hesitant to leave the house    OBJECTIVE:  Functional test: 5x STS 05/26/22: 1 min 14 sec 5x STS 06/11/22: 32 sec no UE support 5x STS 07/06/22: 42 sec 5xSTS 08/10/22:   TUG (1/24): 1:27   TODAY'S TREATMENT:    [facility] Adult PT Treatment:                                                DATE: 08/10/2022 Therapeutic Activity: Seated: (with subjective intake) forward flexion + extension Side bending (hand behind head) Berg Balance Test STS with focus on hip hinge mechanics TUG Discussion with patient on personal goals with continuing PT    [facility] Adult PT Treatment:                                                DATE: 08/03/2022 Therapeutic Activity: Fwd amb close SBA x80' Standing UBE fwd (close SBA) x 67mn Fwd/bkwd stepping 6" steps Lateral step down/up 4" step Side scooting edge of table (anterior weight shifting mechanics) Quadruped trunk flexion   [facility] Adult PT Treatment:  DATE: 07/03/2022 Therapeutic Activity: Fwd amb close SBA x80' Standing UBE fwd & bkwd (close SBA) Mat Table: Open books (hand behind head)  S/L straight leg hip abd + ext x10 B S/L leg circles x10 Supine side scooting with bridging Modified thomas stretch x1 min B Wall: standing unsupported (CGA) + reaching (numbers on  wall) Seated thoracic extension over coregeous ball Bkwd walking (CGA) Seated deadlift 5#DB x10   PATIENT EDUCATION: Education details: Teacher, music goods in Publishing rights manager Person educated: Patient and Caregiver   Education method: Explanation Education comprehension: verbalized understanding  HOME EXERCISE PROGRAM: Access Code: [email or web address] URL: [email or web address] Date: 07/24/2022 Prepared by: Helane Gunther  Exercises - [place]  - 1 x daily - 7 x weekly - 3 sets - 10 reps - Clam with Resistance  - 1 x daily - 7 x weekly - 3 sets - 10 reps - Sidelying Hip Abduction and Extension with Loop Band  - 1 x daily - 7 x weekly - 3 sets - 10 reps - Lower Trunk Rotations  - 1 x daily - 7 x weekly - 3 sets - 10 reps - Supine Bridge with Mini Swiss Ball Between Knees  - 1 x daily - 7 x weekly - 3 sets - 10 reps - Supine Hamstring Stretch with Strap  - 1 x daily - 7 x weekly - 3 sets - 10 reps - Seated Reach Forward, Up, and To Sides  - 1 x daily - 7 x weekly - 3 sets - 10 reps - Seated Reaching to Side and Across Body  - 2 x daily - 7 x weekly - 2 sets - 10 reps - Sit to Stand with Counter Support  - 1 x daily - 7 x weekly - 3 sets - 10 reps - Seated Shoulder Row with Anchored Resistance  - 1 x daily - 7 x weekly - 3 sets - 10 reps - Seated Anti-Rotation Press With Anchored Resistance  - 1 x daily - 7 x weekly - 3 sets - 10 reps - Seated Trunk Rotation with Anchored Resistance  - 1 x daily - 7 x weekly - 3 sets - 10 reps - Gastroc Stretch on Wall  - 2 x daily - 7 x weekly - 1 sets - 4 reps - 30 sec hold - Standing Heel Raise with Support  - 1 x daily - 7 x weekly - 3 sets - 10 reps - Side to Side Weight Shift with Overhead Reach and Counter Support  - 2 x daily - 7 x weekly - 2 sets - 10 reps - Alternating Over Head Reach  - 1 x daily - 7 x weekly - 3 sets - 10 reps  GOALS: Goals reviewed with patient? Yes  SHORT TERM GOALS: Target date: 05/27/2022  Will be  compliant with appropriate HEP with no more than MinA from caregiver  Baseline: Goal status: MET   2.  Will be able to verbalize at least 3 ways to reduce fall risk at home and in the community  Baseline:  Goal status: MET   3.  Will be able to complete 5x sit to stand in 20 seconds or less with no posterior LOB to show improved functional mobility  Baseline: 1 min 15 sec 05/26/22 Goal status: IN PROGRESS 12/28:IN PROGRESS 06/19/2022: PROGRESSING (20mn 4 sec) 07/06/22: PROGRESSING (33 sec)  4.  Will be able to ambulate at least 1524fwith no rest breaks and LRAD  Baseline: 2x75' with one  seated rest break using RW 05/26/22 Goal status: IN PROGRESS 12/28: PARTIALLY MET 06/19/2022: MET (160' RW)    LONG TERM GOALS: Target date: 09/22/2022    Will be able to complete TUG in 40 seconds or less with LRAD and no more than Min guard to show improved balance/mobility  Baseline:  Goal status: IN PROGRESS 07/06/22: 1 min 22 sec 08/10/22: 1 min 34 sec  2.  Pt will increase BERG balance score to >/=30/56 to demonstrate improved static balance. Baseline: 19/56 (12/4) Goal status: IN PROGRESS (26/56) on 08/10/22 (updated in flowsheet)  3.  Will be able to navigate single step with LRAD and no more than Min guard for safety  Baseline:  Goal status: MET (CGA/close SBA from caretaker)  4.  Pain in R ankle to be no more than 5/10 at worst  Baseline:  Goal status: MET no pain  5.  Will be able to ambulate at least 275f with LRAD, no rest breaks, and Min guard to show improved functional mobility  Baseline:  Goal status: MET     ASSESSMENT:  CLINICAL IMPRESSION:  Patient has demonstrated good progression with dynamic balance and postural strength; patient has progressed to close SBA for all transfers and during ambulation. Patient continues to require verbal cues to increase hip hinge mechanics during sit to stand transfers in order to increase anterior weight shifting. Moderate postural  sway exhibited during standing balance with eyes closed and NBOS. Improved upright standing posture noted, however patient has difficulty maintaining posture and continues to revert back to forward flexed trunk posture in standing and when walking. Discussion with patient on personal goals with continued therapy; patient states he would like to focus on his balance and improve his stability in standing and when walking.   OBJECTIVE IMPAIRMENTS: Abnormal gait, decreased activity tolerance, decreased balance, decreased coordination, decreased knowledge of use of DME, decreased mobility, difficulty walking, decreased strength, and pain.    PLAN:  PT FREQUENCY: 2x/week  PT DURATION: 6 weeks  PLANNED INTERVENTIONS: Therapeutic exercises, Therapeutic activity, Neuromuscular re-education, Balance training, Gait training, Patient/Family education, Self Care, Joint mobilization, Stair training, DME instructions, Aquatic Therapy, Wheelchair mobility training, Biofeedback, Ionotophoresis '4mg'$ /ml Dexamethasone, Manual therapy, and Re-evaluation  PLAN FOR NEXT SESSION: Progress dynamic balance and postural stability  DONAWERTH,KAREN, PT 08/11/2022, 9:41 AM

## 2022-08-11 NOTE — Addendum Note (Signed)
Addended by: Kennith Gain on: 08/11/2022 09:43 AM   Modules accepted: Orders

## 2022-08-18 ENCOUNTER — Ambulatory Visit: Payer: Medicare Other | Admitting: Physical Therapy

## 2022-08-18 DIAGNOSIS — R260 Ataxic gait: Secondary | ICD-10-CM

## 2022-08-18 DIAGNOSIS — R2689 Other abnormalities of gait and mobility: Secondary | ICD-10-CM

## 2022-08-18 DIAGNOSIS — R2681 Unsteadiness on feet: Secondary | ICD-10-CM | POA: Diagnosis not present

## 2022-08-18 DIAGNOSIS — R262 Difficulty in walking, not elsewhere classified: Secondary | ICD-10-CM

## 2022-08-18 NOTE — Therapy (Signed)
OUTPATIENT PHYSICAL THERAPY NEURO TREATMENT   Patient Name: Justin [name] MRN: [id number] DOB:[birth year], 82 y.o., male Today's Date: 08/18/2022  PCP: Sherrie Mustache  REFERRING PROVIDER: [name], NP    END OF SESSION:  PT End of Session - 08/18/22 1101     Visit Number 30    Number of Visits 41    Date for PT Re-Evaluation 09/22/22    Authorization Type BCBS MCR    PT Start Time 1101    PT Stop Time 1140    PT Time Calculation (min) 39 min    Activity Tolerance Patient tolerated treatment well    Behavior During Therapy [facility] for tasks assessed/performed             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   Diaphragmatic hernia without mention of obstruction or gangrene 1982   Hiatal hernia   Diplopia 2006   Disturbance of skin sensation 2003   Diverticulosis of colon (without mention of hemorrhage) 1997   Dizziness and giddiness 2001   Dysphagia, unspecified(787.20) 1999 and   Esophagitis, unspecified 1997   GERD (gastroesophageal reflux disease) 2003   Hypertension 2003   Impotence of organic origin 1999   Lack of coordination September 07, 2007   Lumbago 2003   Migraine with aura, without mention of intractable migraine without mention of status migrainosus 1997   Myalgia and myositis, unspecified 1999   Other and unspecified hyperlipidemia 2003   Other disorders of vitreous 2003   Other malaise and fatigue 2003   Other seborrheic keratosis 2013   Other seborrheic keratosis 04/13/09   Pain in joint, site unspecified 04/13/12   Personal history of fall 04/15/11   Restless legs syndrome (RLS) 1997   Spinocerebellar disease, 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 Donne Hazel   C3-4 anterior cervical surgery  1999   Dr. Hal Neer   CARDIAC CATHETERIZATION  2001   Dr. Glade Lloyd   COLONOSCOPY  07/20/07   Dr. 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   Internal hemorrhoids without complication 123456   Arthritis 02/21/2014   Dysarthria 02/21/2014   Seborrheic keratosis 02/21/2014   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   Tinnitus 04/12/2013   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[place]) 10/16/2012   Impotence, 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   Essential hypertension    GERD (gastroesophageal reflux disease)    Dyslipidemia    Lumbago    Dysphagia     ONSET DATE: 04/17/2022  REFERRING DIAG: G11.9 (ICD-10-CM) - Spinocerebellar  disease ([place]) R26.89 (ICD-10-CM) - Imbalance  THERAPY DIAG:  Difficulty in walking, not elsewhere classified  Other abnormalities of gait and mobility  Ataxic gait  Unsteadiness on feet  Rationale for Evaluation and Treatment: Rehabilitation  SUBJECTIVE:                                                                                                                                                                                             SUBJECTIVE STATEMENT: Pt reports no issues. Caregiver notes improved amb at home. Pt states he has been trying to stand more at home. 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 No  PRECAUTIONS: Fall  WEIGHT BEARING RESTRICTIONS: No  FALLS: Has patient fallen in last 6 months? No; FOF (+), not hesitant to leave the house    OBJECTIVE:  Functional test: 5x STS 05/26/22: 1 min 14 sec 5x STS 06/11/22: 32 sec no UE support 5x STS 07/06/22: 42 sec 5xSTS 08/10/22:   TUG (1/24): 1:27   TODAY'S TREATMENT:   [facility] Adult PT Treatment:                                                DATE: 08/18/22 Therapeutic Exercise: Amb x80' RW  Sitting Row green TB 2x10 Shoulder ext green TB 2x10 Shoulder horizontal abd 2x10 Shoulder ER green TB 2x10 Neuromuscular re-ed: Standing using 4" step: Hip flexor + lateral flexion stretch 2x30 sec One foot on step, balance 2x30 sec R&L Side step 2x10 Sit<>stand 5# 2x5   [facility] Adult PT Treatment:                                                DATE: 08/10/2022 Therapeutic Activity: Seated: (with subjective intake) forward flexion + extension Side bending (hand behind head) Berg Balance Test STS with focus on hip hinge mechanics TUG Discussion with patient on personal goals with continuing PT    [facility] Adult PT Treatment:  DATE: 08/03/2022 Therapeutic Activity: Fwd amb close SBA x80' Standing UBE fwd (close SBA) x 32min Fwd/bkwd stepping 6" steps Lateral step down/up 4" step Side scooting edge of table (anterior weight shifting mechanics) Quadruped trunk flexion   PATIENT EDUCATION: Education details: Teacher, music goods in Publishing rights manager Person educated: Patient and Caregiver   Education method: Explanation Education comprehension: verbalized understanding  HOME EXERCISE PROGRAM: Access Code: [email or web address] URL:  [email or web address] Date: 07/24/2022 Prepared by: Helane Gunther  Exercises - [place]  - 1 x daily - 7 x weekly - 3 sets - 10 reps - Clam with Resistance  - 1 x daily - 7 x weekly - 3 sets - 10 reps - Sidelying Hip Abduction and Extension with Loop Band  - 1 x daily - 7 x weekly - 3 sets - 10 reps - Lower Trunk Rotations  - 1 x daily - 7 x weekly - 3 sets - 10 reps - Supine Bridge with Mini Swiss Ball Between Knees  - 1 x daily - 7 x weekly - 3 sets - 10 reps - Supine Hamstring Stretch with Strap  - 1 x daily - 7 x weekly - 3 sets - 10 reps - Seated Reach Forward, Up, and To Sides  - 1 x daily - 7 x weekly - 3 sets - 10 reps - Seated Reaching to Side and Across Body  - 2 x daily - 7 x weekly - 2 sets - 10 reps - Sit to Stand with Counter Support  - 1 x daily - 7 x weekly - 3 sets - 10 reps - Seated Shoulder Row with Anchored Resistance  - 1 x daily - 7 x weekly - 3 sets - 10 reps - Seated Anti-Rotation Press With Anchored Resistance  - 1 x daily - 7 x weekly - 3 sets - 10 reps - Seated Trunk Rotation with Anchored Resistance  - 1 x daily - 7 x weekly - 3 sets - 10 reps - Gastroc Stretch on Wall  - 2 x daily - 7 x weekly - 1 sets - 4 reps - 30 sec hold - Standing Heel Raise with Support  - 1 x daily - 7 x weekly - 3 sets - 10 reps - Side to Side Weight Shift with Overhead Reach and Counter Support  - 2 x daily - 7 x weekly - 2 sets - 10 reps - Alternating Over Head Reach  - 1 x daily - 7 x weekly - 3 sets - 10 reps  GOALS: Goals reviewed with patient? Yes  SHORT TERM GOALS: Target date: 05/27/2022  Will be compliant with appropriate HEP with no more than MinA from caregiver  Baseline: Goal status: MET   2.  Will be able to verbalize at least 3 ways to reduce fall risk at home and in the community  Baseline:  Goal status: MET   3.  Will be able to complete 5x sit to stand in 20 seconds or less with no posterior LOB to show improved functional mobility   Baseline: 1 min 15 sec 05/26/22 Goal status: IN PROGRESS 12/28:IN PROGRESS 06/19/2022: PROGRESSING (33min 4 sec) 07/06/22: PROGRESSING (33 sec)  4.  Will be able to ambulate at least 117ft with no rest breaks and LRAD  Baseline: 2x75' with one seated rest break using RW 05/26/22 Goal status: IN PROGRESS 12/28: PARTIALLY MET 06/19/2022: MET (160' RW)    LONG TERM GOALS: Target date: 09/22/2022    Will be able to  complete TUG in 40 seconds or less with LRAD and no more than Min guard to show improved balance/mobility  Baseline:  Goal status: IN PROGRESS 07/06/22: 1 min 22 sec 08/10/22: 1 min 34 sec  2.  Pt will increase BERG balance score to >/=30/56 to demonstrate improved static balance. Baseline: 19/56 (12/4) Goal status: IN PROGRESS (26/56) on 08/10/22 (updated in flowsheet)  3.  Will be able to navigate single step with LRAD and no more than Min guard for safety  Baseline:  Goal status: MET (CGA/close SBA from caretaker)  4.  Pain in R ankle to be no more than 5/10 at worst  Baseline:  Goal status: MET no pain  5.  Will be able to ambulate at least 270ft with LRAD, no rest breaks, and Min guard to show improved functional mobility  Baseline:  Goal status: MET     ASSESSMENT:  CLINICAL IMPRESSION:  Percell Miller continues to demonstrate good progress with maintaining his standing balance and posture. Session focused on improving single leg stability/weight shift as well as postural strengthening and hip strengthening. He tolerated session well. Fatigues with sit<>stands without UE support.    OBJECTIVE IMPAIRMENTS: Abnormal gait, decreased activity tolerance, decreased balance, decreased coordination, decreased knowledge of use of DME, decreased mobility, difficulty walking, decreased strength, and pain.    PLAN:  PT FREQUENCY: 2x/week  PT DURATION: 6 weeks  PLANNED INTERVENTIONS: Therapeutic exercises, Therapeutic activity, Neuromuscular re-education, Balance training,  Gait training, Patient/Family education, Self Care, Joint mobilization, Stair training, DME instructions, Aquatic Therapy, Wheelchair mobility training, Biofeedback, Ionotophoresis 4mg /ml Dexamethasone, Manual therapy, and Re-evaluation  PLAN FOR NEXT SESSION: Progress dynamic balance and postural stability  Shaunn Tackitt April Ma L Marque Bango, PT 08/18/2022, 11:51 AM

## 2022-08-20 ENCOUNTER — Ambulatory Visit: Payer: Medicare Other

## 2022-08-20 DIAGNOSIS — R2681 Unsteadiness on feet: Secondary | ICD-10-CM

## 2022-08-20 DIAGNOSIS — R260 Ataxic gait: Secondary | ICD-10-CM

## 2022-08-20 DIAGNOSIS — R262 Difficulty in walking, not elsewhere classified: Secondary | ICD-10-CM

## 2022-08-20 DIAGNOSIS — R2689 Other abnormalities of gait and mobility: Secondary | ICD-10-CM

## 2022-08-20 NOTE — Therapy (Signed)
OUTPATIENT PHYSICAL THERAPY NEURO TREATMENT   Patient Name: Justin [name] MRN: [id number] DOB:11/30/1940, 82 y.o., male Today's Date: 08/20/2022  PCP: Sherrie Mustache  REFERRING PROVIDER: [name], NP    END OF SESSION:  PT End of Session - 08/20/22 1444     Visit Number 31    Number of Visits 41    Date for PT Re-Evaluation 09/22/22    Authorization Type BCBS MCR    PT Start Time 1445    PT Stop Time 1533    PT Time Calculation (min) 48 min    Activity Tolerance Patient tolerated treatment well    Behavior During Therapy [facility] for tasks assessed/performed             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   Diaphragmatic hernia without mention of obstruction or gangrene 1982   Hiatal hernia   Diplopia 2006   Disturbance of skin sensation 2003   Diverticulosis of colon (without mention of hemorrhage) 1997   Dizziness and giddiness 2001   Dysphagia, unspecified(787.20) 1999 and   Esophagitis, unspecified 1997   GERD (gastroesophageal reflux disease) 2003   Hypertension 2003   Impotence of organic origin 1999   Lack of coordination September 07, 2007   Lumbago 2003   Migraine with aura, without mention of intractable migraine without mention of status migrainosus 1997   Myalgia and myositis, unspecified 1999   Other and unspecified hyperlipidemia 2003   Other disorders of vitreous 2003   Other malaise and fatigue 2003   Other seborrheic keratosis 2013   Other seborrheic keratosis 04/13/09   Pain in joint, site unspecified 04/13/12   Personal history of fall 04/15/11   Restless legs syndrome (RLS) 1997   Spinocerebellar disease, 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 Donne Hazel   C3-4 anterior cervical surgery  1999   Dr. Hal Neer   CARDIAC CATHETERIZATION  2001   Dr. Glade Lloyd   COLONOSCOPY  07/20/07   Dr. 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   Internal hemorrhoids without complication 123456   Arthritis 02/21/2014   Dysarthria 02/21/2014   Seborrheic keratosis 02/21/2014   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   Tinnitus 04/12/2013   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[place]) 10/16/2012   Impotence, 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   Essential hypertension    GERD (gastroesophageal reflux disease)    Dyslipidemia    Lumbago    Dysphagia     ONSET DATE: 04/17/2022  REFERRING DIAG: G11.9 (ICD-10-CM) - Spinocerebellar  disease ([place]) R26.89 (ICD-10-CM) - Imbalance  THERAPY DIAG:  Difficulty in walking, not elsewhere classified  Other abnormalities of gait and mobility  Ataxic gait  Unsteadiness on feet  Rationale for Evaluation and Treatment: Rehabilitation  SUBJECTIVE:                                                                                                                                                                                             SUBJECTIVE STATEMENT: Patient reports he was sore after last visit; states he is feeling shaky today.   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 No  PRECAUTIONS: Fall  WEIGHT BEARING RESTRICTIONS: No  FALLS: Has patient fallen in last 6 months? No; FOF (+), not hesitant to leave the house    OBJECTIVE:  Functional test: 5x STS 05/26/22: 1 min 14 sec 5x STS 06/11/22: 32 sec no UE support 5x STS 07/06/22: 42 sec 5xSTS 08/10/22:   TUG (1/24): 1:27   TODAY'S TREATMENT:   [facility] Adult PT Treatment:                                                DATE: 08/20/2022 Therapeutic Activity: Fwd amb CGA/close SBA - focus on equal step length  Standing overhead reaching stretch STS + RTB around hips (resisted hip extension)  Seated resisted trunk extension (black TB around upper back) Stride stance balance --> added slow R/L head turns Leg press: DL 115# x10   [facility] Adult PT Treatment:                                                DATE: 08/18/22 Therapeutic Exercise: Amb x80' RW  Sitting Row green TB 2x10 Shoulder ext green TB 2x10 Shoulder horizontal abd 2x10 Shoulder ER green TB 2x10 Neuromuscular re-ed: Standing using 4" step: Hip flexor + lateral flexion stretch 2x30 sec One foot on step, balance 2x30 sec R&L Side step 2x10 Sit<>stand 5# 2x5   [facility] Adult PT Treatment:  DATE: 08/10/2022 Therapeutic Activity: Seated: (with subjective intake) forward flexion + extension Side bending (hand behind head) Berg Balance Test STS with focus on hip hinge mechanics TUG Discussion with patient on personal goals with continuing PT   PATIENT EDUCATION: Education details: Teacher, music goods in Publishing rights manager Person educated: Patient and Caregiver   Education method: Explanation Education comprehension: verbalized understanding  HOME EXERCISE PROGRAM: Access Code: [email or web address] URL:  [email or web address] Date: 07/24/2022 Prepared by: Helane Gunther  Exercises - [place]  - 1 x daily - 7 x weekly - 3 sets - 10 reps - Clam with Resistance  - 1 x daily - 7 x weekly - 3 sets - 10 reps - Sidelying Hip Abduction and Extension with Loop Band  - 1 x daily - 7 x weekly - 3 sets - 10 reps - Lower Trunk Rotations  - 1 x daily - 7 x weekly - 3 sets - 10 reps - Supine Bridge with Mini Swiss Ball Between Knees  - 1 x daily - 7 x weekly - 3 sets - 10 reps - Supine Hamstring Stretch with Strap  - 1 x daily - 7 x weekly - 3 sets - 10 reps - Seated Reach Forward, Up, and To Sides  - 1 x daily - 7 x weekly - 3 sets - 10 reps - Seated Reaching to Side and Across Body  - 2 x daily - 7 x weekly - 2 sets - 10 reps - Sit to Stand with Counter Support  - 1 x daily - 7 x weekly - 3 sets - 10 reps - Seated Shoulder Row with Anchored Resistance  - 1 x daily - 7 x weekly - 3 sets - 10 reps - Seated Anti-Rotation Press With Anchored Resistance  - 1 x daily - 7 x weekly - 3 sets - 10 reps - Seated Trunk Rotation with Anchored Resistance  - 1 x daily - 7 x weekly - 3 sets - 10 reps - Gastroc Stretch on Wall  - 2 x daily - 7 x weekly - 1 sets - 4 reps - 30 sec hold - Standing Heel Raise with Support  - 1 x daily - 7 x weekly - 3 sets - 10 reps - Side to Side Weight Shift with Overhead Reach and Counter Support  - 2 x daily - 7 x weekly - 2 sets - 10 reps - Alternating Over Head Reach  - 1 x daily - 7 x weekly - 3 sets - 10 reps  GOALS: Goals reviewed with patient? Yes  SHORT TERM GOALS: Target date: 05/27/2022  Will be compliant with appropriate HEP with no more than MinA from caregiver  Baseline: Goal status: MET   2.  Will be able to verbalize at least 3 ways to reduce fall risk at home and in the community  Baseline:  Goal status: MET   3.  Will be able to complete 5x sit to stand in 20 seconds or less with no posterior LOB to show improved functional mobility   Baseline: 1 min 15 sec 05/26/22 Goal status: IN PROGRESS 12/28:IN PROGRESS 06/19/2022: PROGRESSING (80min 4 sec) 07/06/22: PROGRESSING (33 sec)  4.  Will be able to ambulate at least 192ft with no rest breaks and LRAD  Baseline: 2x75' with one seated rest break using RW 05/26/22 Goal status: IN PROGRESS 12/28: PARTIALLY MET 06/19/2022: MET (160' RW)    LONG TERM GOALS: Target date: 09/22/2022  Will be able to complete TUG  in 40 seconds or less with LRAD and no more than Min guard to show improved balance/mobility  Baseline:  Goal status: IN PROGRESS 07/06/22: 1 min 22 sec 08/10/22: 1 min 34 sec  2.  Pt will increase BERG balance score to >/=30/56 to demonstrate improved static balance. Baseline: 19/56 (12/4) Goal status: IN PROGRESS (26/56) on 08/10/22 (updated in flowsheet)  3.  Will be able to navigate single step with LRAD and no more than Min guard for safety  Baseline:  Goal status: MET (CGA/close SBA from caretaker)  4.  Pain in R ankle to be no more than 5/10 at worst  Baseline:  Goal status: MET no pain  5.  Will be able to ambulate at least 222ft with LRAD, no rest breaks, and Min guard to show improved functional mobility  Baseline:  Goal status: MET     ASSESSMENT:  CLINICAL IMPRESSION:  Dynamic balance and postural stability challenged with standing balance in stride stance; moderate postural sway demonstrated with occasional assist provided by therapist to maintain balance. Leg press introduced to progress LE functional strength; cueing improved eccentric control.   OBJECTIVE IMPAIRMENTS: Abnormal gait, decreased activity tolerance, decreased balance, decreased coordination, decreased knowledge of use of DME, decreased mobility, difficulty walking, decreased strength, and pain.    PLAN:  PT FREQUENCY: 2x/week  PT DURATION: 6 weeks  PLANNED INTERVENTIONS: Therapeutic exercises, Therapeutic activity, Neuromuscular re-education, Balance training, Gait  training, Patient/Family education, Self Care, Joint mobilization, Stair training, DME instructions, Aquatic Therapy, Wheelchair mobility training, Biofeedback, Ionotophoresis 4mg /ml Dexamethasone, Manual therapy, and Re-evaluation  PLAN FOR NEXT SESSION: Progress dynamic balance and postural stability  Hardin Negus, PTA 08/20/2022, 3:41 PM

## 2022-08-24 ENCOUNTER — Ambulatory Visit: Payer: Medicare Other

## 2022-08-24 DIAGNOSIS — R262 Difficulty in walking, not elsewhere classified: Secondary | ICD-10-CM

## 2022-08-24 DIAGNOSIS — R260 Ataxic gait: Secondary | ICD-10-CM

## 2022-08-24 DIAGNOSIS — R2681 Unsteadiness on feet: Secondary | ICD-10-CM | POA: Diagnosis not present

## 2022-08-24 DIAGNOSIS — R2689 Other abnormalities of gait and mobility: Secondary | ICD-10-CM

## 2022-08-24 NOTE — Therapy (Signed)
OUTPATIENT PHYSICAL THERAPY NEURO TREATMENT   Patient Name: Justin [name] MRN: [id number] DOB:04/25/1941, 82 y.o., male Today's Date: 08/24/2022  PCP: Sherrie Mustache  REFERRING PROVIDER: [name], NP    END OF SESSION:  PT End of Session - 08/24/22 1401     Visit Number 32    Number of Visits 41    Date for PT Re-Evaluation 09/22/22    Authorization Type BC[employer] MCR    PT Start Time 1401    PT Stop Time 1448    PT Time Calculation (min) 47 min    Activity Tolerance Patient tolerated treatment well    Behavior During Therapy [facility] for tasks assessed/performed             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   Diaphragmatic hernia without mention of obstruction or gangrene 1982   Hiatal hernia   Diplopia 2006   Disturbance of skin sensation 2003   Diverticulosis of colon (without mention of hemorrhage) 1997   Dizziness and giddiness 2001   Dysphagia, unspecified(787.20) 1999 and   Esophagitis, unspecified 1997   GERD (gastroesophageal reflux disease) 2003   Hypertension 2003   Impotence of organic origin 1999   Lack of coordination September 07, 2007   Lumbago 2003   Migraine with aura, without mention of intractable migraine without mention of status migrainosus 1997   Myalgia and myositis, unspecified 1999   Other and unspecified hyperlipidemia 2003   Other disorders of vitreous 2003   Other malaise and fatigue 2003   Other seborrheic keratosis 2013   Other seborrheic keratosis 04/13/09   Pain in joint, site unspecified 04/13/12   Personal history of fall 04/15/11   Restless legs syndrome (RLS) 1997   Spinocerebellar disease, 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 Donne Hazel   C3-4 anterior cervical surgery  1999   Dr. Hal Neer   CARDIAC CATHETERIZATION  2001   Dr. Glade Lloyd   COLONOSCOPY  07/20/07   Dr. 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   Internal hemorrhoids without complication [id number]   Arthritis 02/21/2014   Dysarthria 02/21/2014   Seborrheic keratosis 02/21/2014   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   Tinnitus 04/12/2013   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[place]) 10/16/2012   Impotence, 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   Essential hypertension    GERD (gastroesophageal reflux disease)    Dyslipidemia    Lumbago    Dysphagia     ONSET DATE: 04/17/2022  REFERRING DIAG: G11.9 (ICD-10-CM) - Spinocerebellar  disease ([place]) R26.89 (ICD-10-CM) - Imbalance  THERAPY DIAG:  Difficulty in walking, not elsewhere classified  Other abnormalities of gait and mobility  Ataxic gait  Unsteadiness on feet  Rationale for Evaluation and Treatment: Rehabilitation  SUBJECTIVE:                                                                                                                                                                                             SUBJECTIVE STATEMENT: Patient reports he sometimes loses his balance backwards, states he is unable to right himself, that once he's going "he's  gone".   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 No  PRECAUTIONS: Fall  WEIGHT BEARING RESTRICTIONS: No  FALLS: Has patient fallen in last 6 months? No; FOF (+), not hesitant to leave the house    OBJECTIVE:  Functional test: 5x STS 05/26/22: 1 min 14 sec 5x STS 06/11/22: 32 sec no UE support 5x STS 07/06/22: 42 sec 5xSTS 08/10/22:   TUG (1/24): 1:27   TODAY'S TREATMENT:   [facility] Adult PT Treatment:                                                DATE: 08/24/2022 Therapeutic Activity: Gastroc stretch on slant board Standing squats + heel raises (alternating) Side stepping RTB crossed around ankles (CGA) Stride stance balance --> added slow head turns R/L (no shoes) Standing alternating heel raise/toe lifts (R/L) --> simultaneous heel lift/toe raise (no shoes) STS holding 2#DB + overhead 2#DB press with look up Standing squats w/pillow tap x10 Standing side bend with 2#DB   [facility] Adult PT Treatment:                                                DATE: 08/20/2022 Therapeutic Activity: Fwd amb CGA/close SBA - focus on equal step length  Standing overhead reaching stretch STS + RTB around hips (resisted hip extension)  Seated resisted trunk extension (black TB around upper back) Stride stance balance --> added slow R/L head turns Leg press: DL 115# x10   [facility] Adult PT Treatment:  DATE: 08/18/22 Therapeutic Exercise: Amb x80' RW  Sitting Row green TB 2x10 Shoulder ext green TB 2x10 Shoulder horizontal abd 2x10 Shoulder ER green TB 2x10 Neuromuscular re-ed: Standing using 4" step: Hip flexor + lateral flexion stretch 2x30 sec One foot on step, balance 2x30 sec R&L Side step 2x10 Sit<>stand 5# 2x5   [facility] Adult PT Treatment:                                                 DATE: 08/10/2022 Therapeutic Activity: Seated: (with subjective intake) forward flexion + extension Side bending (hand behind head) Berg Balance Test STS with focus on hip hinge mechanics TUG Discussion with patient on personal goals with continuing PT   PATIENT EDUCATION: Education details: Updated HEP Person educated: Patient and Caregiver   Education method: Explanation Education comprehension: verbalized understanding  HOME EXERCISE PROGRAM: Access Code: [email or web address] URL: [email or web address] Date: 08/24/2022 Prepared by: Helane Gunther  Exercises - Dora  - 1 x daily - 7 x weekly - 3 sets - 10 reps - Clam with Resistance  - 1 x daily - 7 x weekly - 3 sets - 10 reps - Sidelying Hip Abduction and Extension with Loop Band  - 1 x daily - 7 x weekly - 3 sets - 10 reps - Lower Trunk Rotations  - 1 x daily - 7 x weekly - 3 sets - 10 reps - Supine Bridge with Mini Swiss Ball Between Knees  - 1 x daily - 7 x weekly - 3 sets - 10 reps - Supine Hamstring Stretch with Strap  - 1 x daily - 7 x weekly - 3 sets - 10 reps - Seated Reach Forward, Up, and To Sides  - 1 x daily - 7 x weekly - 3 sets - 10 reps - Seated Reaching to Side and Across Body  - 2 x daily - 7 x weekly - 2 sets - 10 reps - Sit to Stand with Counter Support  - 1 x daily - 7 x weekly - 3 sets - 10 reps - Seated Shoulder Row with Anchored Resistance  - 1 x daily - 7 x weekly - 3 sets - 10 reps - Seated Anti-Rotation Press With Anchored Resistance  - 1 x daily - 7 x weekly - 3 sets - 10 reps - Seated Trunk Rotation with Anchored Resistance  - 1 x daily - 7 x weekly - 3 sets - 10 reps - Gastroc Stretch on Wall  - 2 x daily - 7 x weekly - 1 sets - 4 reps - 30 sec hold - Standing Heel Raise with Support  - 1 x daily - 7 x weekly - 3 sets - 10 reps - Side to Side Weight Shift with Overhead Reach and Counter Support  - 2 x daily - 7 x weekly - 2 sets - 10 reps - Staggered Stance Forward  Backward Weight Shift with Counter Support  - 1 x daily - 7 x weekly - 3 sets - 10 reps - Standing Sidebending with Chair Support  - 1 x daily - 7 x weekly - 3 sets - 10 reps - Mini Squat with Chair  - 1 x daily - 7 x weekly - 3 sets - 10 reps  GOALS: Goals reviewed with patient? Yes  SHORT TERM GOALS: Target date: 05/27/2022  Will  be compliant with appropriate HEP with no more than MinA from caregiver  Baseline: Goal status: MET   2.  Will be able to verbalize at least 3 ways to reduce fall risk at home and in the community  Baseline:  Goal status: MET   3.  Will be able to complete 5x sit to stand in 20 seconds or less with no posterior LOB to show improved functional mobility  Baseline: 1 min 15 sec 05/26/22 Goal status: IN PROGRESS 12/28:IN PROGRESS 06/19/2022: PROGRESSING (43min 4 sec) 07/06/22: PROGRESSING (33 sec)  4.  Will be able to ambulate at least 162ft with no rest breaks and LRAD  Baseline: 2x75' with one seated rest break using RW 05/26/22 Goal status: IN PROGRESS 12/28: PARTIALLY MET 06/19/2022: MET (160' RW)    LONG TERM GOALS: Target date: 09/22/2022  Will be able to complete TUG in 40 seconds or less with LRAD and no more than Min guard to show improved balance/mobility  Baseline:  Goal status: IN PROGRESS 07/06/22: 1 min 22 sec 08/10/22: 1 min 34 sec  2.  Pt will increase BERG balance score to >/=30/56 to demonstrate improved static balance. Baseline: 19/56 (12/4) Goal status: IN PROGRESS (26/56) on 08/10/22 (updated in flowsheet)  3.  Will be able to navigate single step with LRAD and no more than Min guard for safety  Baseline:  Goal status: MET (CGA/close SBA from caretaker)  4.  Pain in R ankle to be no more than 5/10 at worst  Baseline:  Goal status: MET no pain  5.  Will be able to ambulate at least 253ft with LRAD, no rest breaks, and Min guard to show improved functional mobility  Baseline:  Goal status: MET     ASSESSMENT:  CLINICAL  IMPRESSION:  Standing balance activities performed with shoes off to increase proprioceptive awareness with weight shifting and ankle strategy. Dynamic balance and functional LE strengthening progressed with sit to stands and overhead arm reach in standing; occasional verbal cues provided for upward gaze and standing posture. Standing side bending stretches increased lateral hip glides and improved pelvic/trunk mobility. Patient exhibited occasional posterior LOB and required CGA to regain balance during standing activities.   OBJECTIVE IMPAIRMENTS: Abnormal gait, decreased activity tolerance, decreased balance, decreased coordination, decreased knowledge of use of DME, decreased mobility, difficulty walking, decreased strength, and pain.    PLAN:  PT FREQUENCY: 2x/week  PT DURATION: 6 weeks  PLANNED INTERVENTIONS: Therapeutic exercises, Therapeutic activity, Neuromuscular re-education, Balance training, Gait training, Patient/Family education, Self Care, Joint mobilization, Stair training, DME instructions, Aquatic Therapy, Wheelchair mobility training, Biofeedback, Ionotophoresis 4mg /ml Dexamethasone, Manual therapy, and Re-evaluation  PLAN FOR NEXT SESSION: Progress and challenge dynamic balance and postural stability  Hardin Negus, PTA 08/24/2022, 2:50 PM

## 2022-08-25 ENCOUNTER — Encounter: Payer: Self-pay | Admitting: Nurse Practitioner

## 2022-08-25 ENCOUNTER — Ambulatory Visit (INDEPENDENT_AMBULATORY_CARE_PROVIDER_SITE_OTHER): Payer: Medicare Other | Admitting: Nurse Practitioner

## 2022-08-25 ENCOUNTER — Telehealth: Payer: Self-pay

## 2022-08-25 DIAGNOSIS — Z0000 Encounter for general adult medical examination without abnormal findings: Secondary | ICD-10-CM | POA: Diagnosis not present

## 2022-08-25 NOTE — Progress Notes (Signed)
Subjective:   Justin [name] is a 81 y.o. male who presents for Medicare Annual/Subsequent preventive examination.  Review of Systems           Objective:    There were no vitals filed for this visit. There is no height or weight on file to calculate BMI.     08/25/2022    8:14 AM 04/29/2022    1:19 PM 04/17/2022    8:59 AM 08/18/2021    8:43 AM 08/15/2021    2:06 PM 04/07/2021   11:48 AM 01/03/2021    1:44 PM  Advanced Directives  Does Patient Have a Medical Advance Directive? Yes Yes Yes Yes Yes Yes Yes  Type of Advance Directive Out of facility DNR (pink MOST or yellow form) [place];Living will Out of facility DNR (pink MOST or yellow form) Out of facility DNR (pink MOST or yellow form) Out of facility DNR (pink MOST or yellow form) Out of facility DNR (pink MOST or yellow form) Out of facility DNR (pink MOST or yellow form)  Does patient want to make changes to medical advance directive? No - Patient declined  No - Patient declined No - Patient declined No - Patient declined No - Patient declined No - Patient declined  Copy of [place] in Chart?  No - copy requested       Pre-existing out of facility DNR order (yellow form or pink MOST form) Yellow form placed in chart (order not valid for inpatient use)  Yellow form placed in chart (order not valid for inpatient use) Yellow form placed in chart (order not valid for inpatient use) Yellow form placed in chart (order not valid for inpatient use) Yellow form placed in chart (order not valid for inpatient use)     Current Medications (verified) Outpatient Encounter Medications as of 08/25/2022  Medication Sig   acetaminophen (TYLENOL 8 HOUR ARTHRITIS PAIN) 650 MG CR tablet Take 1 tablet (650 mg total) by mouth 2 (two) times daily.   amLODipine (NORVASC) 5 MG tablet TAKE 1 TABLET BY MOUTH EVERY DAY   fenofibrate (TRICOR) 145 MG tablet Take 1 tablet (145 mg total) by mouth daily.    hydrochlorothiazide (HYDRODIURIL) 12.5 MG tablet TAKE 1 TABLET BY MOUTH EVERY DAY   losartan (COZAAR) 100 MG tablet TAKE 1 TABLET (100 MG TOTAL) BY MOUTH DAILY   oxybutynin (DITROPAN) 5 MG tablet TAKE 1 TABLET BY MOUTH EVERY DAY   pantoprazole (PROTONIX) 40 MG tablet TAKE 1 TABLET BY MOUTH EVERY DAY   potassium chloride (KLOR-CON) 10 MEQ tablet TAKE 1 TABLET BY MOUTH EVERY DAY   pregabalin (LYRICA) 25 MG capsule TAKE 1 CAPSULE BY MOUTH THREE TIMES A DAY   propranolol (INDERAL) 40 MG tablet TAKE 1 TABLET BY MOUTH TWICE A DAY   No facility-administered encounter medications on file as of 08/25/2022.    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   Diaphragmatic hernia without mention of obstruction or gangrene 1982   Hiatal hernia   Diplopia 2006   Disturbance of skin sensation  2003   Diverticulosis of colon (without mention of hemorrhage) 1997   Dizziness and giddiness 2001   Dysphagia, unspecified(787.20) 1999 and   Esophagitis, unspecified 1997   GERD (gastroesophageal reflux disease) 2003   Hypertension 2003   Impotence of organic origin 1999   Lack of coordination September 07, 2007   Lumbago 2003   Migraine with aura, without mention of intractable migraine without mention of status migrainosus 1997   Myalgia and myositis, unspecified 1999   Other and unspecified hyperlipidemia 2003   Other disorders of vitreous 2003   Other malaise and fatigue 2003   Other seborrheic keratosis 2013   Other seborrheic keratosis 04/13/09   Pain in joint, site unspecified 04/13/12    Personal history of fall 04/15/11   Restless legs syndrome (RLS) 1997   Spinocerebellar disease, 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 Donne Hazel   C3-4 anterior cervical surgery  1999   Dr. Hal Neer   CARDIAC CATHETERIZATION  2001   Dr. Glade Lloyd   COLONOSCOPY  07/20/07   Dr. Sharlett Iles   EYE SURGERY Bilateral 2015   cataracts   LAMINOTOMY / EXCISION DISK POSTERIOR CERVICAL SPINE     Spinal Disk (?4-5)   Family History  Problem Relation Age of Onset   Heart disease Father        SCA with multiple family members with SCA   Diabetes Neg Hx    Colon cancer Neg Hx    Colon polyps Neg Hx    Esophageal cancer Neg Hx    Kidney disease Neg Hx    Gallbladder disease Neg Hx    Social History   Socioeconomic History   Marital status: Widowed    Spouse name: Not on file   Number of children: 2   Years of education: Not on file   Highest education level: Not on file  Occupational History   Occupation: Real Estate  Tobacco Use   Smoking status: Never   Smokeless tobacco: Former    Types: Chew    Quit date: 06/02/1979   Tobacco comments:    Stopped Chew about 1981  Vaping Use   Vaping Use: Never used  Substance and Sexual Activity   Alcohol use: Not Currently   Drug use: No   Sexual activity: Yes  Other Topics Concern   Not on file  Social History Narrative   Right handed   Drinks caffeine   One story home   Social Determinants of Health   Financial Resource Strain: Low Risk  (04/04/2018)   Overall Financial Resource Strain (CARDIA)    Difficulty of Paying Living Expenses: Not hard at all  Food Insecurity: No Food Insecurity (04/04/2018)   Hunger Vital Sign    Worried About Running Out of Food in the Last Year: Never true    [place] in the Last Year: Never true  Transportation Needs: No Transportation Needs  (04/04/2018)   PRAPARE - Hydrologist (Medical): No    Lack of Transportation (Non-Medical): No  Physical Activity: Insufficiently Active (04/04/2018)   Exercise Vital Sign    Days of Exercise per Week: 3 days    Minutes of Exercise per Session: 30 min  Stress: No Stress Concern Present (04/04/2018)   [place]  Questionnaire    Feeling of Stress : Not at all  Social Connections: Somewhat Isolated (04/04/2018)   Social Connection and Isolation Panel [NHANES]    Frequency of Communication with Friends and Family: More than three times a week    Frequency of Social Gatherings with Friends and Family: More than three times a week    Attends Religious Services: Never    Marine scientist or Organizations: No    Attends Music therapist: Never    Marital Status: Married    Tobacco Counseling Counseling given: Not Answered Tobacco comments: Stopped Chew about 1981   Clinical Intake:  Pre-visit preparation completed: Yes  Pain : No/denies pain     BMI - recorded: 28 Diabetes: No  How often do you need to have someone help you when you read instructions, pamphlets, or other written materials from your doctor or pharmacy?: 1 - Never  Diabetic?no         Activities of Daily Living    08/25/2022   10:11 AM 08/25/2022   10:10 AM  In your present state of health, do you have any difficulty performing the following activities:  Hearing? 0 0  Vision? 0   Difficulty concentrating or making decisions? 0   Walking or climbing stairs? 1   Dressing or bathing? 0   Doing errands, shopping? 1   Comment has help   Preparing Food and eating ? Y   Comment has help   Using the Toilet? N   In the past six months, have you accidently leaked urine? Y   Do you have problems with loss of bowel control? N   Managing your Medications? N   Managing your Finances? N   Housekeeping or managing  your Housekeeping? Y   Comment has help     Patient Care Team: [name], NP as PCP - General (Geriatric Medicine) Nahser, Wonda Cheng, MD as PCP - Cardiology (Cardiology) Tat, Eustace Quail, DO as Consulting Physician (Neurology) Camillo Flaming, OD as Referring Physician (Optometry)  Indicate any recent Medical Services you may have received from other than [facility] providers in the past year (date may be approximate).     Assessment:   This is a routine wellness examination for [facility].  Hearing/Vision screen Hearing Screening - Comments:: No hearing issues  Vision Screening - Comments:: Last eye exam less than 12 month ago Dr.Koop   Dietary issues and exercise activities discussed: Current Exercise Habits: Home exercise routine, Type of exercise: calisthenics, Time (Minutes): 60, Frequency (Times/Week): 5, Weekly Exercise (Minutes/Week): 300   Goals Addressed   None    Depression Screen    08/25/2022    9:40 AM 04/17/2022   10:49 AM 04/07/2021    1:36 PM 01/03/2021    1:43 PM 08/12/2020    3:59 PM 06/20/2020    2:57 PM 11/16/2019    2:42 PM  PHQ 2/9 Scores  PHQ - 2 Score 0 0 0 0 0 0 0    Fall Risk    08/25/2022    9:40 AM 04/17/2022   10:49 AM 12/15/2021    1:42 PM 08/15/2021    2:14 PM 04/07/2021    1:36 PM  Fall Risk   Falls in the past year? 0 0 1 1 0  Number falls in past yr: 0 0 0 1 0  Injury with Fall? 0 0 0 0 0  Risk for fall due to : No Fall Risks No Fall Risks History of fall(s)  History of fall(s) No Fall Risks  Follow up Falls evaluation completed Falls evaluation completed  Falls evaluation completed Falls evaluation completed    Black:  Any stairs in or around the home? Yes  If so, are there any without handrails? No  Home free of loose throw rugs in walkways, pet beds, electrical cords, etc? Yes  Adequate lighting in your home to reduce risk of falls? Yes   ASSISTIVE DEVICES UTILIZED TO PREVENT FALLS:  Life alert?  Yes  Use of a cane, walker or w/c? Yes  Grab bars in the bathroom? Yes  Shower chair or bench in shower? No  Elevated toilet seat or a handicapped toilet? No   TIMED UP AND GO:  Was the test performed? No .    Cognitive Function:    04/04/2018    9:29 AM  MMSE - Mini Mental State Exam  Orientation to time 5  Orientation to Place 5  Registration 3  Attention/ Calculation 5  Recall 2  Language- name 2 objects 2  Language- repeat 1  Language- follow 3 step command 3  Language- read & follow direction 1  Write a sentence 1  Copy design 1  Total score 29        08/25/2022    9:44 AM 08/12/2020    4:01 PM  6CIT Screen  What Year? 0 points 0 points  What month? 0 points 0 points  What time? 0 points 0 points  Count back from 20 0 points 0 points  Months in reverse 2 points 0 points  Repeat phrase 0 points 0 points  Total Score 2 points 0 points    Immunizations Immunization History  Administered Date(s) Administered   DTaP 06/12/2011   Fluad Quad(high Dose 65+) 01/23/2019, 02/19/2020, 03/26/2022   Influenza Whole 03/01/2013   Influenza, High Dose Seasonal PF 03/03/2017, 02/17/2018, 03/15/2021   Influenza,inj,Quad PF,6+ Mos 02/21/2014, 02/20/2015   Influenza-Unspecified 01/31/2016   PFIZER(Purple Top)SARS-COV-2 Vaccination 06/11/2019, 07/02/2019, 04/12/2020, 01/14/2021   Pneumococcal Conjugate-13 10/11/2013   Pneumococcal Polysaccharide-23 03/01/2005, 04/01/2017   Tdap 06/12/2011   Zoster Recombinat (Shingrix) 08/23/2020, 11/26/2020   Zoster, Live 10/15/2010    TDAP status: Due, Education has been provided regarding the importance of this vaccine. Advised may receive this vaccine at local pharmacy or Health Dept. Aware to provide a copy of the vaccination record if obtained from local pharmacy or Health Dept. Verbalized acceptance and understanding.  Flu Vaccine status: Up to date  Pneumococcal vaccine status: Up to date  Covid-19 vaccine status: Information  provided on how to obtain vaccines.   Qualifies for Shingles Vaccine? Yes   Zostavax completed No   Shingrix Completed?: Yes  Screening Tests Health Maintenance  Topic Date Due   DTaP/Tdap/Td (3 - Td or Tdap) 06/11/2021   Medicare Annual Wellness (AWV)  08/25/2023   Pneumonia Vaccine 23+ Years old  Completed   INFLUENZA VACCINE  Completed   Zoster Vaccines- Shingrix  Completed   HPV VACCINES  Aged Out   COVID-19 Vaccine  Discontinued    Health Maintenance  Health Maintenance Due  Topic Date Due   DTaP/Tdap/Td (3 - Td or Tdap) 06/11/2021    Colorectal cancer screening: No longer required.   Lung Cancer Screening: (Low Dose CT Chest recommended if Age 80-80 years, 30 pack-year currently smoking OR have quit w/in 15years.) does not qualify.   Lung Cancer Screening Referral: na  Additional Screening:  Hepatitis C Screening: does not qualify  Vision Screening: Recommended annual ophthalmology exams for early detection of glaucoma and other disorders of the eye. Is the patient up to date with their annual eye exam?  Yes  Who is the provider or what is the name of the office in which the patient attends annual eye exams? [facility] If pt is not established with a provider, would they like to be referred to a provider to establish care? No .   Dental Screening: Recommended annual dental exams for proper oral hygiene  Community Resource Referral / Chronic Care Management: CRR required this visit?  No   CCM required this visit?  No      Plan:     I have personally reviewed and noted the following in the patient's chart:   Medical and social history Use of alcohol, tobacco or illicit drugs  Current medications and supplements including opioid prescriptions. Patient is not currently taking opioid prescriptions. Functional ability and status Nutritional status Physical activity Advanced directives List of other physicians Hospitalizations, surgeries, and ER visits in  previous 12 months Vitals Screenings to include cognitive, depression, and falls Referrals and appointments  In addition, I have reviewed and discussed with patient certain preventive protocols, quality metrics, and best practice recommendations. A written personalized care plan for preventive services as well as general preventive health recommendations were provided to patient.     [name], NP   08/25/2022   Virtual Visit via video  I connected with patient on 08/25/22 at  9:40 AM EDT by video and verified that I am speaking with the correct person using two identifiers.  Location: Patient: home Provider: [facility]    I discussed the limitations, risks, security and privacy concerns of performing an evaluation and management service by telephone and the availability of in person appointments. I also discussed with the patient that there may be a patient responsible charge related to this service. The patient expressed understanding and agreed to proceed.   I discussed the assessment and treatment plan with the patient. The patient was provided an opportunity to ask questions and all were answered. The patient agreed with the plan and demonstrated an understanding of the instructions.   The patient was advised to call back or seek an in-person evaluation if the symptoms worsen or if the condition fails to improve as anticipated.  I provided 15 minutes of non-face-to-face time during this encounter.  [employer]. Harle Battiest Avs printed and mailed

## 2022-08-25 NOTE — Patient Instructions (Signed)
Justin [name] , Thank you for taking time to come for your Medicare Wellness Visit. I appreciate your ongoing commitment to your health goals. Please review the following plan we discussed and let me know if I can assist you in the future.   Screening recommendations/referrals: Colonoscopy aged out Recommended yearly ophthalmology/optometry visit for glaucoma screening and checkup Recommended yearly dental visit for hygiene and checkup  Vaccinations: Influenza vaccine due annually in September/October Pneumococcal vaccine up to date Tdap vaccine -DUE- recommend to get at your local pharmacy Shingles vaccine up to date    Advanced directives: on file  Conditions/risks identified: advanced age, fall risk.   Next appointment: yearly   Preventive Care 12 Years and Older, Male Preventive care refers to lifestyle choices and visits with your health care provider that can promote health and wellness. What does preventive care include? A yearly physical exam. This is also called an annual well check. Dental exams once or twice a year. Routine eye exams. Ask your health care provider how often you should have your eyes checked. Personal lifestyle choices, including: Daily care of your teeth and gums. Regular physical activity. Eating a healthy diet. Avoiding tobacco and drug use. Limiting alcohol use. Practicing safe sex. Taking low doses of aspirin every day. Taking vitamin and mineral supplements as recommended by your health care provider. What happens during an annual well check? The services and screenings done by your health care provider during your annual well check will depend on your age, overall health, lifestyle risk factors, and family history of disease. Counseling  Your health care provider may ask you questions about your: Alcohol use. Tobacco use. Drug use. Emotional well-being. Home and relationship well-being. Sexual activity. Eating habits. History of  falls. Memory and ability to understand (cognition). Work and work Statistician. Screening  You may have the following tests or measurements: Height, weight, and BMI. Blood pressure. Lipid and cholesterol levels. These may be checked every 5 years, or more frequently if you are over 39 years old. Skin check. Lung cancer screening. You may have this screening every year starting at age 39 if you have a 30-pack-year history of smoking and currently smoke or have quit within the past 15 years. Fecal occult blood test (FOBT) of the stool. You may have this test every year starting at age 71. Flexible sigmoidoscopy or colonoscopy. You may have a sigmoidoscopy every 5 years or a colonoscopy every 10 years starting at age 21. Prostate cancer screening. Recommendations will vary depending on your family history and other risks. Hepatitis C blood test. Hepatitis B blood test. Sexually transmitted disease (STD) testing. Diabetes screening. This is done by checking your blood sugar (glucose) after you have not eaten for a while (fasting). You may have this done every 1-3 years. Abdominal aortic aneurysm (AAA) screening. You may need this if you are a current or former smoker. Osteoporosis. You may be screened starting at age 22 if you are at high risk. Talk with your health care provider about your test results, treatment options, and if necessary, the need for more tests. Vaccines  Your health care provider may recommend certain vaccines, such as: Influenza vaccine. This is recommended every year. Tetanus, diphtheria, and acellular pertussis (Tdap, Td) vaccine. You may need a Td booster every 10 years. Zoster vaccine. You may need this after age 50. Pneumococcal 13-valent conjugate (PCV13) vaccine. One dose is recommended after age 26. Pneumococcal polysaccharide (PPSV23) vaccine. One dose is recommended after age 94. Talk to your  health care provider about which screenings and vaccines you need and  how often you need them. This information is not intended to replace advice given to you by your health care provider. Make sure you discuss any questions you have with your health care provider. Document Released: 06/14/2015 Document Revised: 02/05/2016 Document Reviewed: 03/19/2015 Elsevier Interactive Patient Education  2017 [place] Prevention in the Home Falls can cause injuries. They can happen to people of all ages. There are many things you can do to make your home safe and to help prevent falls. What can I do on the outside of my home? Regularly fix the edges of walkways and driveways and fix any cracks. Remove anything that might make you trip as you walk through a door, such as a raised step or threshold. Trim any bushes or trees on the path to your home. Use bright outdoor lighting. Clear any walking paths of anything that might make someone trip, such as rocks or tools. Regularly check to see if handrails are loose or broken. Make sure that both sides of any steps have handrails. Any raised decks and porches should have guardrails on the edges. Have any leaves, snow, or ice cleared regularly. Use sand or salt on walking paths during winter. Clean up any spills in your garage right away. This includes oil or grease spills. What can I do in the bathroom? Use night lights. Install grab bars by the toilet and in the tub and shower. Do not use towel bars as grab bars. Use non-skid mats or decals in the tub or shower. If you need to sit down in the shower, use a plastic, non-slip stool. Keep the floor dry. Clean up any water that spills on the floor as soon as it happens. Remove soap buildup in the tub or shower regularly. Attach bath mats securely with double-sided non-slip rug tape. Do not have throw rugs and other things on the floor that can make you trip. What can I do in the bedroom? Use night lights. Make sure that you have a light by your bed that is easy to  reach. Do not use any sheets or blankets that are too big for your bed. They should not hang down onto the floor. Have a firm chair that has side arms. You can use this for support while you get dressed. Do not have throw rugs and other things on the floor that can make you trip. What can I do in the kitchen? Clean up any spills right away. Avoid walking on wet floors. Keep items that you use a lot in easy-to-reach places. If you need to reach something above you, use a strong step stool that has a grab bar. Keep electrical cords out of the way. Do not use floor polish or wax that makes floors slippery. If you must use wax, use non-skid floor wax. Do not have throw rugs and other things on the floor that can make you trip. What can I do with my stairs? Do not leave any items on the stairs. Make sure that there are handrails on both sides of the stairs and use them. Fix handrails that are broken or loose. Make sure that handrails are as long as the stairways. Check any carpeting to make sure that it is firmly attached to the stairs. Fix any carpet that is loose or worn. Avoid having throw rugs at the top or bottom of the stairs. If you do have throw rugs, attach them  to the floor with carpet tape. Make sure that you have a light switch at the top of the stairs and the bottom of the stairs. If you do not have them, ask someone to add them for you. What else can I do to help prevent falls? Wear shoes that: Do not have high heels. Have rubber bottoms. Are comfortable and fit you well. Are closed at the toe. Do not wear sandals. If you use a stepladder: Make sure that it is fully opened. Do not climb a closed stepladder. Make sure that both sides of the stepladder are locked into place. Ask someone to hold it for you, if possible. Clearly mark and make sure that you can see: Any grab bars or handrails. First and last steps. Where the edge of each step is. Use tools that help you move  around (mobility aids) if they are needed. These include: Canes. Walkers. Scooters. Crutches. Turn on the lights when you go into a dark area. Replace any light bulbs as soon as they burn out. Set up your furniture so you have a clear path. Avoid moving your furniture around. If any of your floors are uneven, fix them. If there are any pets around you, be aware of where they are. Review your medicines with your doctor. Some medicines can make you feel dizzy. This can increase your chance of falling. Ask your doctor what other things that you can do to help prevent falls. This information is not intended to replace advice given to you by your health care provider. Make sure you discuss any questions you have with your health care provider. Document Released: 03/14/2009 Document Revised: 10/24/2015 Document Reviewed: 06/22/2014 Elsevier Interactive Patient Education  2017 [employer].

## 2022-08-25 NOTE — Telephone Encounter (Signed)
Justin [name], Justin [name] are scheduled for a virtual visit with your provider today.    Just as we do with appointments in the office, we must obtain your consent to participate.  Your consent will be active for this visit and any virtual visit you may have with one of our providers in the next 365 days.    If you have a MyChart account, I can also send a copy of this consent to you electronically.  All virtual visits are billed to your insurance company just like a traditional visit in the office.  As this is a virtual visit, video technology does not allow for your provider to perform a traditional examination.  This may limit your provider's ability to fully assess your condition.  If your provider identifies any concerns that need to be evaluated in person or the need to arrange testing such as labs, EKG, etc, we will make arrangements to do so.    Although advances in technology are sophisticated, we cannot ensure that it will always work on either your end or our end.  If the connection with a video visit is poor, we may have to switch to a telephone visit.  With either a video or telephone visit, we are not always able to ensure that we have a secure connection.   I need to obtain your verbal consent now.   Are you willing to proceed with your visit today?   Justin [name] has provided verbal consent on 08/25/2022 for a virtual visit (video or telephone).   Justin [name], [place] 08/25/2022  9:20 AM

## 2022-08-25 NOTE — Progress Notes (Signed)
   This service is provided via telemedicine  No vital signs collected/recorded due to the encounter was a telemedicine visit.   Location of patient (ex: home, work):  Home  Patient consents to a telephone visit: Yes, see telephone visit dated 08/25/22  Location of the provider (ex: office, home):  [place], Remote Location   Name of any referring provider:  N/A  Names of all persons participating in the telemedicine service and their role in the encounter:  S.Chrae B/CMA, Sherrie Mustache, NP, Kenney Houseman (Friend), and Patient   Time spent on call:  11 min with medical assistant

## 2022-08-26 ENCOUNTER — Ambulatory Visit: Payer: Medicare Other

## 2022-08-26 ENCOUNTER — Telehealth: Payer: Self-pay

## 2022-08-26 DIAGNOSIS — R2689 Other abnormalities of gait and mobility: Secondary | ICD-10-CM

## 2022-08-26 DIAGNOSIS — R260 Ataxic gait: Secondary | ICD-10-CM

## 2022-08-26 DIAGNOSIS — R262 Difficulty in walking, not elsewhere classified: Secondary | ICD-10-CM

## 2022-08-26 DIAGNOSIS — R2681 Unsteadiness on feet: Secondary | ICD-10-CM | POA: Diagnosis not present

## 2022-08-26 NOTE — Therapy (Signed)
OUTPATIENT PHYSICAL THERAPY NEURO TREATMENT   Patient Name: Justin [name] MRN: [id number] DOB:Jul 30, 1940, 82 y.o., male Today's Date: 08/26/2022  PCP: Sherrie Mustache  REFERRING PROVIDER: [name], NP    END OF SESSION:  PT End of Session - 08/26/22 1446     Visit Number 33    Number of Visits 41    Date for PT Re-Evaluation 09/22/22    Authorization Type BCBS MCR    PT Start Time 1446    PT Stop Time 1533    PT Time Calculation (min) 47 min    Activity Tolerance Patient tolerated treatment well    Behavior During Therapy [facility] for tasks assessed/performed             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   Diaphragmatic hernia without mention of obstruction or gangrene 1982   Hiatal hernia   Diplopia 2006   Disturbance of skin sensation 2003   Diverticulosis of colon (without mention of hemorrhage) 1997   Dizziness and giddiness 2001   Dysphagia, unspecified(787.20) 1999 and   Esophagitis, unspecified 1997   GERD (gastroesophageal reflux disease) 2003   Hypertension 2003   Impotence of organic origin 1999   Lack of coordination September 07, 2007   Lumbago 2003   Migraine with aura, without mention of intractable migraine without mention of status migrainosus 1997   Myalgia and myositis, unspecified 1999   Other and unspecified hyperlipidemia 2003   Other disorders of vitreous 2003   Other malaise and fatigue 2003   Other seborrheic keratosis 2013   Other seborrheic keratosis 04/13/09   Pain in joint, site unspecified 04/13/12   Personal history of fall 04/15/11   Restless legs syndrome (RLS) 1997   Spinocerebellar disease, 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 Donne Hazel   C3-4 anterior cervical surgery  1999   Dr. Hal Neer   CARDIAC CATHETERIZATION  2001   Dr. Glade Lloyd   COLONOSCOPY  07/20/07   Dr. 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   Internal hemorrhoids without complication 123456   Arthritis 02/21/2014   Dysarthria 02/21/2014   Seborrheic keratosis 02/21/2014   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   Tinnitus 04/12/2013   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[place]) 10/16/2012   Impotence, 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   Essential hypertension    GERD (gastroesophageal reflux disease)    Dyslipidemia    Lumbago    Dysphagia     ONSET DATE: 04/17/2022  REFERRING DIAG: G11.9 (ICD-10-CM) - Spinocerebellar  disease ([place]) R26.89 (ICD-10-CM) - Imbalance  THERAPY DIAG:  Difficulty in walking, not elsewhere classified  Other abnormalities of gait and mobility  Ataxic gait  Unsteadiness on feet  Rationale for Evaluation and Treatment: Rehabilitation  SUBJECTIVE:                                                                                                                                                                                             SUBJECTIVE STATEMENT:  Patient reports he had difficulty with squat exercise at home, states he had a tendency to fall to R side.   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 No  PRECAUTIONS: Fall  WEIGHT BEARING RESTRICTIONS: No  FALLS: Has patient fallen in last 6 months? No; FOF (+), not hesitant to leave the house    OBJECTIVE:  Functional test: 5x STS 05/26/22: 1 min 14 sec 5x STS 06/11/22: 32 sec no UE support 5x STS 07/06/22: 42 sec 5xSTS 08/10/22:   TUG (1/24): 1:27   TODAY'S TREATMENT:    [facility] Adult PT Treatment:                                                DATE: 08/26/2022 Therapeutic Activity: Forward stepping + 1 HHA on FW Seated side stretches STS no HHA w/focus on glute/hip extension Standing resisted hip extension (BTB tied on stair railing) Standing trunk rotation (top step --> hand reach across to opposite rail) Lateral heel tap downs     [facility] Adult PT Treatment:                                                DATE: 08/24/2022 Therapeutic Activity: Gastroc stretch on slant board Standing squats + heel raises (alternating) Side stepping RTB crossed around ankles (CGA) Stride stance balance --> added slow head turns R/L (no shoes) Standing alternating heel raise/toe lifts (R/L) --> simultaneous heel lift/toe raise (no shoes) STS holding 2#DB + overhead 2#DB press with look up Standing squats w/pillow tap x10 Standing side bend with 2#DB   [facility] Adult PT Treatment:  DATE: 08/20/2022 Therapeutic Activity: Fwd amb CGA/close SBA - focus on equal step length  Standing overhead reaching stretch STS + RTB around hips (resisted hip extension)  Seated resisted trunk extension (black TB around upper back) Stride stance balance --> added slow R/L head turns Leg press: DL 115# x10   PATIENT EDUCATION: Education details: Updated HEP Person educated: Patient and Caregiver   Education method:  Explanation Education comprehension: verbalized understanding  HOME EXERCISE PROGRAM: Access Code: [email or web address] URL: [email or web address] Date: 08/24/2022 Prepared by: Helane Gunther  Exercises - [place]  - 1 x daily - 7 x weekly - 3 sets - 10 reps - Clam with Resistance  - 1 x daily - 7 x weekly - 3 sets - 10 reps - Sidelying Hip Abduction and Extension with Loop Band  - 1 x daily - 7 x weekly - 3 sets - 10 reps - Lower Trunk Rotations  - 1 x daily - 7 x weekly - 3 sets - 10 reps - Supine Bridge with Mini Swiss Ball Between Knees  - 1 x daily - 7 x weekly - 3 sets - 10 reps - Supine Hamstring Stretch with Strap  - 1 x daily - 7 x weekly - 3 sets - 10 reps - Seated Reach Forward, Up, and To Sides  - 1 x daily - 7 x weekly - 3 sets - 10 reps - Seated Reaching to Side and Across Body  - 2 x daily - 7 x weekly - 2 sets - 10 reps - Sit to Stand with Counter Support  - 1 x daily - 7 x weekly - 3 sets - 10 reps - Seated Shoulder Row with Anchored Resistance  - 1 x daily - 7 x weekly - 3 sets - 10 reps - Seated Anti-Rotation Press With Anchored Resistance  - 1 x daily - 7 x weekly - 3 sets - 10 reps - Seated Trunk Rotation with Anchored Resistance  - 1 x daily - 7 x weekly - 3 sets - 10 reps - Gastroc Stretch on Wall  - 2 x daily - 7 x weekly - 1 sets - 4 reps - 30 sec hold - Standing Heel Raise with Support  - 1 x daily - 7 x weekly - 3 sets - 10 reps - Side to Side Weight Shift with Overhead Reach and Counter Support  - 2 x daily - 7 x weekly - 2 sets - 10 reps - Staggered Stance Forward Backward Weight Shift with Counter Support  - 1 x daily - 7 x weekly - 3 sets - 10 reps - Standing Sidebending with Chair Support  - 1 x daily - 7 x weekly - 3 sets - 10 reps - Mini Squat with Chair  - 1 x daily - 7 x weekly - 3 sets - 10 reps  GOALS: Goals reviewed with patient? Yes  SHORT TERM GOALS: Target date: 05/27/2022  Will be compliant with appropriate HEP with no more than  MinA from caregiver  Baseline: Goal status: MET   2.  Will be able to verbalize at least 3 ways to reduce fall risk at home and in the community  Baseline:  Goal status: MET   3.  Will be able to complete 5x sit to stand in 20 seconds or less with no posterior LOB to show improved functional mobility  Baseline: 1 min 15 sec 05/26/22 Goal status: IN PROGRESS 12/28:IN PROGRESS 06/19/2022: PROGRESSING (12min 4 sec) 07/06/22: PROGRESSING (33  sec)  4.  Will be able to ambulate at least 155ft with no rest breaks and LRAD  Baseline: 2x75' with one seated rest break using RW 05/26/22 Goal status: IN PROGRESS 12/28: PARTIALLY MET 06/19/2022: MET (160' RW)    LONG TERM GOALS: Target date: 09/22/2022  Will be able to complete TUG in 40 seconds or less with LRAD and no more than Min guard to show improved balance/mobility  Baseline:  Goal status: IN PROGRESS 07/06/22: 1 min 22 sec 08/10/22: 1 min 34 sec  2.  Pt will increase BERG balance score to >/=30/56 to demonstrate improved static balance. Baseline: 19/56 (12/4) Goal status: IN PROGRESS (26/56) on 08/10/22 (updated in flowsheet)  3.  Will be able to navigate single step with LRAD and no more than Min guard for safety  Baseline:  Goal status: MET (CGA/close SBA from caretaker)  4.  Pain in R ankle to be no more than 5/10 at worst  Baseline:  Goal status: MET no pain  5.  Will be able to ambulate at least 241ft with LRAD, no rest breaks, and Min guard to show improved functional mobility  Baseline:  Goal status: MET     ASSESSMENT:  CLINICAL IMPRESSION:  Patient continued with standing dynamic balance activities, challenging stability with decreased UEs support during forward stepping. Dynamic hip extension progressed with resisted standing knee extension. Trunk rotation exercises continued to promote increased reciprocal gait pattern. CGA provided with standing activities to provide safe and adequate postural support.     OBJECTIVE IMPAIRMENTS: Abnormal gait, decreased activity tolerance, decreased balance, decreased coordination, decreased knowledge of use of DME, decreased mobility, difficulty walking, decreased strength, and pain.    PLAN:  PT FREQUENCY: 2x/week  PT DURATION: 6 weeks  PLANNED INTERVENTIONS: Therapeutic exercises, Therapeutic activity, Neuromuscular re-education, Balance training, Gait training, Patient/Family education, Self Care, Joint mobilization, Stair training, DME instructions, Aquatic Therapy, Wheelchair mobility training, Biofeedback, Ionotophoresis 4mg /ml Dexamethasone, Manual therapy, and Re-evaluation  PLAN FOR NEXT SESSION: Progress and challenge dynamic balance and postural stability  Hardin Negus, PTA 08/26/2022, 3:35 PM

## 2022-08-26 NOTE — Telephone Encounter (Signed)
Patient dropped dmv form off to be completed and signed by Sherrie Mustache, NP.  Form placed on [name]'s desk for her to complete and sign.  Message routed to Sherrie Mustache, NP [facility])

## 2022-08-26 NOTE — Telephone Encounter (Signed)
Completed and given back to CI

## 2022-08-31 ENCOUNTER — Ambulatory Visit: Payer: Medicare Other | Attending: Nurse Practitioner

## 2022-08-31 DIAGNOSIS — M79671 Pain in right foot: Secondary | ICD-10-CM | POA: Diagnosis not present

## 2022-08-31 DIAGNOSIS — R2681 Unsteadiness on feet: Secondary | ICD-10-CM | POA: Diagnosis not present

## 2022-08-31 DIAGNOSIS — R260 Ataxic gait: Secondary | ICD-10-CM | POA: Diagnosis not present

## 2022-08-31 DIAGNOSIS — R262 Difficulty in walking, not elsewhere classified: Secondary | ICD-10-CM | POA: Diagnosis not present

## 2022-08-31 DIAGNOSIS — R2689 Other abnormalities of gait and mobility: Secondary | ICD-10-CM | POA: Diagnosis not present

## 2022-08-31 NOTE — Therapy (Signed)
OUTPATIENT PHYSICAL THERAPY NEURO TREATMENT   Patient Name: Justin [name] MRN: [id number] DOB:[birth year], 82 y.o., male Today's Date: 08/31/2022  PCP: Sherrie Mustache  REFERRING PROVIDER: [name], NP    END OF SESSION:  PT End of Session - 08/31/22 1359     Visit Number 34    Number of Visits 41    Date for PT Re-Evaluation 09/22/22    Authorization Type BCBS MCR    PT Start Time 1400    PT Stop Time [id number]    PT Time Calculation (min) 45 min    Activity Tolerance Patient tolerated treatment well    Behavior During Therapy [facility] for tasks assessed/performed             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   Diaphragmatic hernia without mention of obstruction or gangrene 1982   Hiatal hernia   Diplopia 2006   Disturbance of skin sensation 2003   Diverticulosis of colon (without mention of hemorrhage) 1997   Dizziness and giddiness 2001   Dysphagia, unspecified(787.20) 1999 and   Esophagitis, unspecified 1997   GERD (gastroesophageal reflux disease) 2003   Hypertension 2003   Impotence of organic origin 1999   Lack of coordination September 07, 2007   Lumbago 2003   Migraine with aura, without mention of intractable migraine without mention of status migrainosus 1997   Myalgia and myositis, unspecified 1999   Other and unspecified hyperlipidemia 2003   Other disorders of vitreous 2003   Other malaise and fatigue 2003   Other seborrheic keratosis 2013   Other seborrheic keratosis 04/13/09   Pain in joint, site unspecified 04/13/12   Personal history of fall 04/15/11   Restless legs syndrome (RLS) 1997   Spinocerebellar disease, 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 Donne Hazel   C3-4 anterior cervical surgery  1999   Dr. Hal Neer   CARDIAC CATHETERIZATION  2001   Dr. Glade Lloyd   COLONOSCOPY  07/20/07   Dr. 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   Urgency incontinence 04/01/2016   History of fall 08/21/2015   Fungal dermatosis 02/20/2015   SCC (squamous cell carcinoma), 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   Internal hemorrhoids without complication 123456   Arthritis 02/21/2014   Dysarthria 02/21/2014   Seborrheic keratosis 02/21/2014   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   Tinnitus 04/12/2013   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/16/2012   Impotence, 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   Essential hypertension    GERD (gastroesophageal reflux disease)    Dyslipidemia    Lumbago    Dysphagia     ONSET DATE: 04/17/2022  REFERRING DIAG: G11.9 (ICD-10-CM) - 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 No  PRECAUTIONS: Fall  WEIGHT BEARING RESTRICTIONS: No  FALLS: Has patient fallen in last 6 months? No; FOF (+), not hesitant to leave the house    OBJECTIVE:  Functional test: 5x STS 05/26/22: 1 min 14 sec 5x STS 06/11/22: 32 sec no UE support 5x STS 07/06/22: 42 sec 5xSTS 08/10/22:   TUG (1/24): 1:27   TODAY'S TREATMENT:    [facility] Adult PT Treatment:                                                DATE: 08/31/2022 Therapeutic Activity: Standing balance with overhead reaching Walking along counter with weighted NBQC (4#AW) Standing squats (pillow tap) no HHA x10 Seated side bend stretches Standing balance: stride stance --> added slow R/L HT --> WBOS + side arm raises Squats with pillow tap  STS: stride stance squat (L foot back) --> R foot propped on yoga block   [facility] Adult PT Treatment:                                                DATE: 08/26/2022 Therapeutic Activity: Forward stepping + 1 HHA on FW Seated side stretches STS no HHA w/focus on glute/hip extension Standing resisted hip extension (BTB tied on stair railing) Standing trunk rotation (top step --> hand reach across to opposite rail) Lateral heel tap downs     [facility] Adult PT Treatment:  DATE: 08/24/2022 Therapeutic Activity: Gastroc stretch on slant board Standing squats + heel raises (alternating) Side stepping RTB crossed around ankles (CGA) Stride stance balance --> added slow head turns R/L (no shoes) Standing alternating heel raise/toe lifts (R/L) --> simultaneous heel lift/toe raise (no shoes) STS holding 2#DB + overhead 2#DB press with look up Standing squats w/pillow tap x10 Standing side bend with 2#DB   PATIENT EDUCATION: Education details: Updated HEP Person educated: Patient and  Caregiver   Education method: Explanation Education comprehension: verbalized understanding  HOME EXERCISE PROGRAM: Access Code: [email or web address] URL: [email or web address] Date: 08/24/2022 Prepared by: Helane Gunther  Exercises - [place]  - 1 x daily - 7 x weekly - 3 sets - 10 reps - Clam with Resistance  - 1 x daily - 7 x weekly - 3 sets - 10 reps - Sidelying Hip Abduction and Extension with Loop Band  - 1 x daily - 7 x weekly - 3 sets - 10 reps - Lower Trunk Rotations  - 1 x daily - 7 x weekly - 3 sets - 10 reps - Supine Bridge with Mini Swiss Ball Between Knees  - 1 x daily - 7 x weekly - 3 sets - 10 reps - Supine Hamstring Stretch with Strap  - 1 x daily - 7 x weekly - 3 sets - 10 reps - Seated Reach Forward, Up, and To Sides  - 1 x daily - 7 x weekly - 3 sets - 10 reps - Seated Reaching to Side and Across Body  - 2 x daily - 7 x weekly - 2 sets - 10 reps - Sit to Stand with Counter Support  - 1 x daily - 7 x weekly - 3 sets - 10 reps - Seated Shoulder Row with Anchored Resistance  - 1 x daily - 7 x weekly - 3 sets - 10 reps - Seated Anti-Rotation Press With Anchored Resistance  - 1 x daily - 7 x weekly - 3 sets - 10 reps - Seated Trunk Rotation with Anchored Resistance  - 1 x daily - 7 x weekly - 3 sets - 10 reps - Gastroc Stretch on Wall  - 2 x daily - 7 x weekly - 1 sets - 4 reps - 30 sec hold - Standing Heel Raise with Support  - 1 x daily - 7 x weekly - 3 sets - 10 reps - Side to Side Weight Shift with Overhead Reach and Counter Support  - 2 x daily - 7 x weekly - 2 sets - 10 reps - Staggered Stance Forward Backward Weight Shift with Counter Support  - 1 x daily - 7 x weekly - 3 sets - 10 reps - Standing Sidebending with Chair Support  - 1 x daily - 7 x weekly - 3 sets - 10 reps - Mini Squat with Chair  - 1 x daily - 7 x weekly - 3 sets - 10 reps  GOALS: Goals reviewed with patient? Yes  SHORT TERM GOALS: Target date: 05/27/2022  Will be compliant with  appropriate HEP with no more than MinA from caregiver  Baseline: Goal status: MET   2.  Will be able to verbalize at least 3 ways to reduce fall risk at home and in the community  Baseline:  Goal status: MET   3.  Will be able to complete 5x sit to stand in 20 seconds or less with no posterior LOB to show improved functional mobility  Baseline: 1 min 15 sec  05/26/22 Goal status: IN PROGRESS 12/28:IN PROGRESS 06/19/2022: PROGRESSING (30min 4 sec) 07/06/22: PROGRESSING (33 sec)  4.  Will be able to ambulate at least 158ft with no rest breaks and LRAD  Baseline: 2x75' with one seated rest break using RW 05/26/22 Goal status: IN PROGRESS 12/28: PARTIALLY MET 06/19/2022: MET (160' RW)    LONG TERM GOALS: Target date: 09/22/2022  Will be able to complete TUG in 40 seconds or less with LRAD and no more than Min guard to show improved balance/mobility  Baseline:  Goal status: IN PROGRESS 07/06/22: 1 min 22 sec 08/10/22: 1 min 34 sec  2.  Pt will increase BERG balance score to >/=30/56 to demonstrate improved static balance. Baseline: 19/56 (12/4) Goal status: IN PROGRESS (26/56) on 08/10/22 (updated in flowsheet)  3.  Will be able to navigate single step with LRAD and no more than Min guard for safety  Baseline:  Goal status: MET (CGA/close SBA from caretaker)  4.  Pain in R ankle to be no more than 5/10 at worst  Baseline:  Goal status: MET no pain  5.  Will be able to ambulate at least 276ft with LRAD, no rest breaks, and Min guard to show improved functional mobility  Baseline:  Goal status: MET     ASSESSMENT:  CLINICAL IMPRESSION:  Ambulation with NBQC and counter support performed to progress gait training and challenge dynamic balance. Ankle weight added to NBQC to provide more support and proprioceptive feedback with AD navigation. R foot propped on yoga block to increase weight bearing on L LE and challenge eccentric control on stand to sit transitions.  OBJECTIVE  IMPAIRMENTS: Abnormal gait, decreased activity tolerance, decreased balance, decreased coordination, decreased knowledge of use of DME, decreased mobility, difficulty walking, decreased strength, and pain.    PLAN:  PT FREQUENCY: 2x/week  PT DURATION: 6 weeks  PLANNED INTERVENTIONS: Therapeutic exercises, Therapeutic activity, Neuromuscular re-education, Balance training, Gait training, Patient/Family education, Self Care, Joint mobilization, Stair training, DME instructions, Aquatic Therapy, Wheelchair mobility training, Biofeedback, Ionotophoresis 4mg /ml Dexamethasone, Manual therapy, and Re-evaluation  PLAN FOR NEXT SESSION: Progress and challenge dynamic balance and postural stability; counter walking with weighted NBQC; L LE strengthening  Hardin Negus, PTA 08/31/2022, 3:30 PM

## 2022-09-02 ENCOUNTER — Ambulatory Visit: Payer: Medicare Other

## 2022-09-02 DIAGNOSIS — R2681 Unsteadiness on feet: Secondary | ICD-10-CM

## 2022-09-02 DIAGNOSIS — R262 Difficulty in walking, not elsewhere classified: Secondary | ICD-10-CM | POA: Diagnosis not present

## 2022-09-02 DIAGNOSIS — M79671 Pain in right foot: Secondary | ICD-10-CM | POA: Diagnosis not present

## 2022-09-02 DIAGNOSIS — R260 Ataxic gait: Secondary | ICD-10-CM | POA: Diagnosis not present

## 2022-09-02 DIAGNOSIS — R2689 Other abnormalities of gait and mobility: Secondary | ICD-10-CM

## 2022-09-02 NOTE — Therapy (Signed)
OUTPATIENT PHYSICAL THERAPY NEURO TREATMENT   Patient Name: Justin [name] MRN: [id number] DOB:[birth year], 82 y.o., male Today's Date: 09/02/2022  PCP: Sherrie Mustache  REFERRING PROVIDER: [name], NP    END OF SESSION:  PT End of Session - 09/02/22 1359     Visit Number 35    Number of Visits 41    Date for PT Re-Evaluation 09/22/22    Authorization Type BCBS MCR    PT Start Time 1400    PT Stop Time [id number]    PT Time Calculation (min) 45 min    Activity Tolerance Patient tolerated treatment well    Behavior During Therapy [facility] for tasks assessed/performed             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   Diaphragmatic hernia without mention of obstruction or gangrene 1982   Hiatal hernia   Diplopia 2006   Disturbance of skin sensation 2003   Diverticulosis of colon (without mention of hemorrhage) 1997   Dizziness and giddiness 2001   Dysphagia, unspecified(787.20) 1999 and   Esophagitis, unspecified 1997   GERD (gastroesophageal reflux disease) 2003   Hypertension 2003   Impotence of organic origin 1999   Lack of coordination September 07, 2007   Lumbago 2003   Migraine with aura, without mention of intractable migraine without mention of status migrainosus 1997   Myalgia and myositis, unspecified 1999   Other and unspecified hyperlipidemia 2003   Other disorders of vitreous 2003   Other malaise and fatigue 2003   Other seborrheic keratosis 2013   Other seborrheic keratosis 04/13/09   Pain in joint, site unspecified 04/13/12   Personal history of fall 04/15/11   Restless legs syndrome (RLS) 1997   Spinocerebellar disease, 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 Donne Hazel   C3-4 anterior cervical surgery  1999   Dr. Hal Neer   CARDIAC CATHETERIZATION  2001   Dr. Glade Lloyd   COLONOSCOPY  07/20/07   Dr. 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   Internal hemorrhoids without complication [id number]   Arthritis 02/21/2014   Dysarthria 02/21/2014   Seborrheic keratosis 02/21/2014   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   Tinnitus 04/12/2013   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/16/2012   Impotence, 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   Essential hypertension    GERD (gastroesophageal reflux disease)    Dyslipidemia    Lumbago    Dysphagia     ONSET DATE: 04/17/2022  REFERRING DIAG: G11.9 (ICD-10-CM) - 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[place]) R26.89 (  ICD-10-CM) - Imbalance  THERAPY DIAG:  Difficulty in walking, not elsewhere classified  Other abnormalities of gait and mobility  Ataxic gait  Unsteadiness on feet  Rationale for Evaluation and Treatment: Rehabilitation  SUBJECTIVE:                                                                                                                                                                                             SUBJECTIVE STATEMENT:  Patient states he is doing well today, states his low back is a little sore.  Pt accompanied by:  caregiver  [name]  PERTINENT HISTORY:   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[place]) -progressive, continues with supportive care - Ambulatory referral to Physical Therapy      PAIN:  Are you having pain? No  PRECAUTIONS: Fall  WEIGHT BEARING RESTRICTIONS: No  FALLS: Has patient fallen in last 6 months? No; FOF (+), not hesitant to leave the house    OBJECTIVE:  Functional test: 5x STS 05/26/22: 1 min 14 sec 5x STS 06/11/22: 32 sec no UE support 5x STS 07/06/22: 42 sec 5xSTS 08/10/22:   TUG (1/24): 1:27   TODAY'S TREATMENT:    [facility] Adult PT Treatment:                                                DATE: 09/02/2022 Therapeutic Activity: Seated side bend stretches --> trunk rotation (choir arms) Seated resisted trunk extension (black superband) --> added noodle for thoracic extension stretch Counter: STS + orange super band around waist for hip extension --> lateral weight shifting with orange super band around hips (hip ext) Side stepping with orange super band around waist --> upper thighs (stepping against resistance) Walking with weighted NBQC (5#AW) CGA    [facility] Adult PT Treatment:                                                DATE: 08/31/2022 Therapeutic Activity: Standing balance with overhead reaching Walking along counter with weighted NBQC (4#AW) Standing squats (pillow tap) no HHA x10 Seated side bend stretches Standing balance: stride stance --> added slow R/L HT --> WBOS + side arm raises Squats with pillow tap  STS: stride stance squat (L foot back) --> R foot propped on yoga block   [facility] Adult PT Treatment:  DATE: 08/26/2022 Therapeutic Activity: Forward stepping + 1 HHA on FW Seated side stretches STS no HHA w/focus on glute/hip extension Standing resisted hip extension (BTB tied on stair railing) Standing trunk rotation (top step --> hand reach across to opposite rail) Lateral heel tap downs    PATIENT EDUCATION: Education details: Updated HEP Person  educated: Patient and Caregiver   Education method: Explanation Education comprehension: verbalized understanding  HOME EXERCISE PROGRAM: Access Code: [email or web address] URL: [email or web address] Date: 08/24/2022 Prepared by: Helane Gunther  Exercises - [place]  - 1 x daily - 7 x weekly - 3 sets - 10 reps - Clam with Resistance  - 1 x daily - 7 x weekly - 3 sets - 10 reps - Sidelying Hip Abduction and Extension with Loop Band  - 1 x daily - 7 x weekly - 3 sets - 10 reps - Lower Trunk Rotations  - 1 x daily - 7 x weekly - 3 sets - 10 reps - Supine Bridge with Mini Swiss Ball Between Knees  - 1 x daily - 7 x weekly - 3 sets - 10 reps - Supine Hamstring Stretch with Strap  - 1 x daily - 7 x weekly - 3 sets - 10 reps - Seated Reach Forward, Up, and To Sides  - 1 x daily - 7 x weekly - 3 sets - 10 reps - Seated Reaching to Side and Across Body  - 2 x daily - 7 x weekly - 2 sets - 10 reps - Sit to Stand with Counter Support  - 1 x daily - 7 x weekly - 3 sets - 10 reps - Seated Shoulder Row with Anchored Resistance  - 1 x daily - 7 x weekly - 3 sets - 10 reps - Seated Anti-Rotation Press With Anchored Resistance  - 1 x daily - 7 x weekly - 3 sets - 10 reps - Seated Trunk Rotation with Anchored Resistance  - 1 x daily - 7 x weekly - 3 sets - 10 reps - Gastroc Stretch on Wall  - 2 x daily - 7 x weekly - 1 sets - 4 reps - 30 sec hold - Standing Heel Raise with Support  - 1 x daily - 7 x weekly - 3 sets - 10 reps - Side to Side Weight Shift with Overhead Reach and Counter Support  - 2 x daily - 7 x weekly - 2 sets - 10 reps - Staggered Stance Forward Backward Weight Shift with Counter Support  - 1 x daily - 7 x weekly - 3 sets - 10 reps - Standing Sidebending with Chair Support  - 1 x daily - 7 x weekly - 3 sets - 10 reps - Mini Squat with Chair  - 1 x daily - 7 x weekly - 3 sets - 10 reps  GOALS: Goals reviewed with patient? Yes  SHORT TERM GOALS: Target date:  05/27/2022  Will be compliant with appropriate HEP with no more than MinA from caregiver  Baseline: Goal status: MET   2.  Will be able to verbalize at least 3 ways to reduce fall risk at home and in the community  Baseline:  Goal status: MET   3.  Will be able to complete 5x sit to stand in 20 seconds or less with no posterior LOB to show improved functional mobility  Baseline: 1 min 15 sec 05/26/22 Goal status: IN PROGRESS 12/28:IN PROGRESS 06/19/2022: PROGRESSING (12min 4 sec) 07/06/22: PROGRESSING (33 sec)  4.  Will be able to ambulate at least 143ft with no rest breaks and LRAD  Baseline: 2x75' with one seated rest break using RW 05/26/22 Goal status: IN PROGRESS 12/28: PARTIALLY MET 06/19/2022: MET (160' RW)    LONG TERM GOALS: Target date: 09/22/2022  Will be able to complete TUG in 40 seconds or less with LRAD and no more than Min guard to show improved balance/mobility  Baseline:  Goal status: IN PROGRESS 07/06/22: 1 min 22 sec 08/10/22: 1 min 34 sec  2.  Pt will increase BERG balance score to >/=30/56 to demonstrate improved static balance. Baseline: 19/56 (12/4) Goal status: IN PROGRESS (26/56) on 08/10/22 (updated in flowsheet)  3.  Will be able to navigate single step with LRAD and no more than Min guard for safety  Baseline:  Goal status: MET (CGA/close SBA from caretaker)  4.  Pain in R ankle to be no more than 5/10 at worst  Baseline:  Goal status: MET no pain  5.  Will be able to ambulate at least 260ft with LRAD, no rest breaks, and Min guard to show improved functional mobility  Baseline:  Goal status: MET     ASSESSMENT:  CLINICAL IMPRESSION:  Gait training continued with weighted NBQC; patient challenged with progression to hand hold assist on counter as needed to challenge dynamic balance and postural stability.     OBJECTIVE IMPAIRMENTS: Abnormal gait, decreased activity tolerance, decreased balance, decreased coordination, decreased knowledge  of use of DME, decreased mobility, difficulty walking, decreased strength, and pain.    PLAN:  PT FREQUENCY: 2x/week  PT DURATION: 6 weeks  PLANNED INTERVENTIONS: Therapeutic exercises, Therapeutic activity, Neuromuscular re-education, Balance training, Gait training, Patient/Family education, Self Care, Joint mobilization, Stair training, DME instructions, Aquatic Therapy, Wheelchair mobility training, Biofeedback, Ionotophoresis 4mg /ml Dexamethasone, Manual therapy, and Re-evaluation  PLAN FOR NEXT SESSION: Progress and challenge dynamic balance and postural stability; counter walking with weighted NBQC; L LE strengthening  Hardin Negus, PTA 09/02/2022, 2:50 PM

## 2022-09-07 ENCOUNTER — Ambulatory Visit: Payer: Medicare Other

## 2022-09-07 DIAGNOSIS — R2681 Unsteadiness on feet: Secondary | ICD-10-CM | POA: Diagnosis not present

## 2022-09-07 DIAGNOSIS — R260 Ataxic gait: Secondary | ICD-10-CM

## 2022-09-07 DIAGNOSIS — M79671 Pain in right foot: Secondary | ICD-10-CM | POA: Diagnosis not present

## 2022-09-07 DIAGNOSIS — R262 Difficulty in walking, not elsewhere classified: Secondary | ICD-10-CM | POA: Diagnosis not present

## 2022-09-07 DIAGNOSIS — R2689 Other abnormalities of gait and mobility: Secondary | ICD-10-CM | POA: Diagnosis not present

## 2022-09-07 NOTE — Therapy (Signed)
OUTPATIENT PHYSICAL THERAPY NEURO TREATMENT   Patient Name: Justin [name] MRN: [id number] DOB:07-28-1940, 82 y.o., male Today's Date: 09/07/2022  PCP: Abbey Chatters  REFERRING PROVIDER: Sharon Seller, NP    END OF SESSION:  PT End of Session - 09/07/22 1402     Visit Number 36    Number of Visits 41    Date for PT Re-Evaluation 09/22/22    Authorization Type BCBS MCR    PT Start Time 1402    PT Stop Time 1442    PT Time Calculation (min) 40 min    Activity Tolerance Patient tolerated treatment well    Behavior During Therapy [facility] for tasks assessed/performed             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   Diaphragmatic hernia without mention of obstruction or gangrene 1982   Hiatal hernia   Diplopia 2006   Disturbance of skin sensation 2003   Diverticulosis of colon (without mention of hemorrhage) 1997   Dizziness and giddiness 2001   Dysphagia, unspecified(787.20) 1999 and   Esophagitis, unspecified 1997   GERD (gastroesophageal reflux disease) 2003   Hypertension 2003   Impotence of organic origin 1999   Lack of coordination September 07, 2007   Lumbago 2003   Migraine with aura, without mention of intractable migraine without mention of status migrainosus 1997   Myalgia and myositis, unspecified 1999   Other and unspecified hyperlipidemia 2003   Other disorders of vitreous 2003   Other malaise and fatigue 2003   Other seborrheic keratosis 2013   Other seborrheic keratosis 04/13/09   Pain in joint, site unspecified 04/13/12   Personal history of fall 04/15/11   Restless legs syndrome (RLS) 1997   Spinocerebellar disease, 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 Dwain Sarna   C3-4 anterior cervical surgery  1999   Dr. Gerlene Fee   CARDIAC CATHETERIZATION  2001   Dr. Aleen Campi   COLONOSCOPY  07/20/07   Dr. 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   Internal hemorrhoids without complication 08/06/2014   Arthritis 02/21/2014   Dysarthria 02/21/2014   Seborrheic keratosis 02/21/2014   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   Tinnitus 04/12/2013   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/16/2012   Impotence, 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   Essential hypertension    GERD (gastroesophageal reflux disease)    Dyslipidemia    Lumbago    Dysphagia     ONSET DATE: 04/17/2022  REFERRING DIAG: G11.9 (ICD-10-CM) - 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[facility]) R26.89 (  ICD-10-CM) - Imbalance  THERAPY DIAG:  Difficulty in walking, not elsewhere classified  Other abnormalities of gait and mobility  Ataxic gait  Unsteadiness on feet  Rationale for Evaluation and Treatment: Rehabilitation  SUBJECTIVE:                                                                                                                                                                                             SUBJECTIVE STATEMENT:  Patient states he is doing well today, states he does HEP daily.  Pt accompanied by:  caregiver  [name]  PERTINENT HISTORY:  Spinocerebellar  disease ([facility]) -progressive, continues with supportive care - Ambulatory referral to Physical Therapy      PAIN:  Are you having pain? No  PRECAUTIONS: Fall  WEIGHT BEARING RESTRICTIONS: No  FALLS: Has patient fallen in last 6 months? No; FOF (+), not hesitant to leave the house    OBJECTIVE:  Functional test: 5x STS 05/26/22: 1 min 14 sec 5x STS 06/11/22: 32 sec no UE support 5x STS 07/06/22: 42 sec 5xSTS 08/10/22:   TUG (1/24): 1:27   TODAY'S TREATMENT:    [facility] Adult PT Treatment:                                                DATE: 09/07/2022 Therapeutic Activity: Standing VORx1 Y/P Seated: smooth pursuits --> VORc Seated forwards bend/spinal extension stretch (active assist at legs provided for stability) Toe tap on 4" step with opposite HHA --> alternating R/L toe taps + opposite HHAx1 Walking with weighted NBQC (5#AW) CGA    [facility] Adult PT Treatment:                                                DATE: 09/02/2022 Therapeutic Activity: Seated side bend stretches --> trunk rotation (choir arms) Seated resisted trunk extension (black superband) --> added noodle for thoracic extension stretch Counter: STS + orange super band around waist for hip extension --> lateral weight shifting with orange super band around hips (hip ext) Side stepping with orange super band around waist --> upper thighs (stepping against resistance) Walking with weighted NBQC (5#AW) CGA    [facility] Adult PT Treatment:  DATE: 08/31/2022 Therapeutic Activity: Standing balance with overhead reaching Walking along counter with weighted NBQC (4#AW) Standing squats (pillow tap) no HHA x10 Seated side bend stretches Standing balance: stride stance --> added slow R/L HT --> WBOS + side arm raises Squats with pillow tap  STS: stride stance squat (L foot back) --> R foot propped on yoga block   PATIENT EDUCATION: Education details: Updated HEP Person educated:  Patient and Caregiver   Education method: Explanation Education comprehension: verbalized understanding  HOME EXERCISE PROGRAM: Access Code: [email or web address] URL: [email or web address] Date: 08/24/2022 Prepared by: Carlynn HeraldKathryn Lorren Splawn  Exercises - Sidelying [place]  - 1 x daily - 7 x weekly - 3 sets - 10 reps - Clam with Resistance  - 1 x daily - 7 x weekly - 3 sets - 10 reps - Sidelying Hip Abduction and Extension with Loop Band  - 1 x daily - 7 x weekly - 3 sets - 10 reps - Lower Trunk Rotations  - 1 x daily - 7 x weekly - 3 sets - 10 reps - Supine Bridge with Mini Swiss Ball Between Knees  - 1 x daily - 7 x weekly - 3 sets - 10 reps - Supine Hamstring Stretch with Strap  - 1 x daily - 7 x weekly - 3 sets - 10 reps - Seated Reach Forward, Up, and To Sides  - 1 x daily - 7 x weekly - 3 sets - 10 reps - Seated Reaching to Side and Across Body  - 2 x daily - 7 x weekly - 2 sets - 10 reps - Sit to Stand with Counter Support  - 1 x daily - 7 x weekly - 3 sets - 10 reps - Seated Shoulder Row with Anchored Resistance  - 1 x daily - 7 x weekly - 3 sets - 10 reps - Seated Anti-Rotation Press With Anchored Resistance  - 1 x daily - 7 x weekly - 3 sets - 10 reps - Seated Trunk Rotation with Anchored Resistance  - 1 x daily - 7 x weekly - 3 sets - 10 reps - Gastroc Stretch on Wall  - 2 x daily - 7 x weekly - 1 sets - 4 reps - 30 sec hold - Standing Heel Raise with Support  - 1 x daily - 7 x weekly - 3 sets - 10 reps - Side to Side Weight Shift with Overhead Reach and Counter Support  - 2 x daily - 7 x weekly - 2 sets - 10 reps - Staggered Stance Forward Backward Weight Shift with Counter Support  - 1 x daily - 7 x weekly - 3 sets - 10 reps - Standing Sidebending with Chair Support  - 1 x daily - 7 x weekly - 3 sets - 10 reps - Mini Squat with Chair  - 1 x daily - 7 x weekly - 3 sets - 10 reps  GOALS: Goals reviewed with patient? Yes  SHORT TERM GOALS: Target date: 05/27/2022  Will be  compliant with appropriate HEP with no more than MinA from caregiver  Baseline: Goal status: MET   2.  Will be able to verbalize at least 3 ways to reduce fall risk at home and in the community  Baseline:  Goal status: MET   3.  Will be able to complete 5x sit to stand in 20 seconds or less with no posterior LOB to show improved functional mobility  Baseline: 1 min 15 sec 05/26/22 Goal status: IN PROGRESS  12/28:IN PROGRESS 06/19/2022: PROGRESSING ( 4 sec) 07/06/22: PROGRESSING (33 sec)  4.  Will be able to ambulate at least 157ft with no rest breaks and LRAD  Baseline: 2x75' with one seated rest break using RW 05/26/22 Goal status: IN PROGRESS 12/28: PARTIALLY MET 06/19/2022: MET (160' RW)    LONG TERM GOALS: Target date: 09/22/2022  Will be able to complete TUG in 40 seconds or less with LRAD and no more than Min guard to show improved balance/mobility  Baseline:  Goal status: IN PROGRESS 07/06/22: 1 min 22 sec 08/10/22: 1 min 34 sec  2.  Pt will increase BERG balance score to >/=30/56 to demonstrate improved static balance. Baseline: 19/56 (12/4) Goal status: IN PROGRESS (26/56) on 08/10/22 (updated in flowsheet)  3.  Will be able to navigate single step with LRAD and no more than Min guard for safety  Baseline:  Goal status: MET (CGA/close SBA from caretaker)  4.  Pain in R ankle to be no more than 5/10 at worst  Baseline:  Goal status: MET no pain  5.  Will be able to ambulate at least 25ft with LRAD, no rest breaks, and Min guard to show improved functional mobility  Baseline:  Goal status: MET     ASSESSMENT:  CLINICAL IMPRESSION:  VOR exercises incorporated to progress gaze stability and vestibular strengthening. Initial gaze instability noted with eyes jumping during VORx1 with head turns in yaw plane; gaze stability improved with repetition.    OBJECTIVE IMPAIRMENTS: Abnormal gait, decreased activity tolerance, decreased balance, decreased coordination,  decreased knowledge of use of DME, decreased mobility, difficulty walking, decreased strength, and pain.    PLAN:  PT FREQUENCY: 2x/week  PT DURATION: 6 weeks  PLANNED INTERVENTIONS: Therapeutic exercises, Therapeutic activity, Neuromuscular re-education, Balance training, Gait training, Patient/Family education, Self Care, Joint mobilization, Stair training, DME instructions, Aquatic Therapy, Wheelchair mobility training, Biofeedback, Ionotophoresis 4mg /ml Dexamethasone, Manual therapy, and Re-evaluation  PLAN FOR NEXT SESSION: Progress and challenge dynamic balance and postural stability; counter walking with weighted NBQC; L LE strengthening  Sanjuana Mae, PTA 09/07/2022, 2:45 PM

## 2022-09-08 ENCOUNTER — Other Ambulatory Visit: Payer: Self-pay | Admitting: Nurse Practitioner

## 2022-09-08 DIAGNOSIS — I10 Essential (primary) hypertension: Secondary | ICD-10-CM

## 2022-09-09 ENCOUNTER — Ambulatory Visit: Payer: Medicare Other

## 2022-09-09 DIAGNOSIS — R260 Ataxic gait: Secondary | ICD-10-CM | POA: Diagnosis not present

## 2022-09-09 DIAGNOSIS — R262 Difficulty in walking, not elsewhere classified: Secondary | ICD-10-CM

## 2022-09-09 DIAGNOSIS — M79671 Pain in right foot: Secondary | ICD-10-CM | POA: Diagnosis not present

## 2022-09-09 DIAGNOSIS — R2689 Other abnormalities of gait and mobility: Secondary | ICD-10-CM

## 2022-09-09 DIAGNOSIS — R2681 Unsteadiness on feet: Secondary | ICD-10-CM

## 2022-09-09 NOTE — Therapy (Signed)
OUTPATIENT PHYSICAL THERAPY NEURO TREATMENT   Patient Name: Justin [name] MRN: [id number] DOB:June 20, 1940, 82 y.o., male Today's Date: 09/09/2022  PCP: Abbey Chatters  REFERRING PROVIDER: Sharon Seller, NP    END OF SESSION:  PT End of Session - 09/09/22 1401     Visit Number 37    Number of Visits 41    Date for PT Re-Evaluation 09/22/22    Authorization Type BCBS MCR    PT Start Time 1400    PT Stop Time 1448    PT Time Calculation (min) 48 min    Activity Tolerance Patient tolerated treatment well    Behavior During Therapy [facility] for tasks assessed/performed             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   Diaphragmatic hernia without mention of obstruction or gangrene 1982   Hiatal hernia   Diplopia 2006   Disturbance of skin sensation 2003   Diverticulosis of colon (without mention of hemorrhage) 1997   Dizziness and giddiness 2001   Dysphagia, unspecified(787.20) 1999 and   Esophagitis, unspecified 1997   GERD (gastroesophageal reflux disease) 2003   Hypertension 2003   Impotence of organic origin 1999   Lack of coordination September 07, 2007   Lumbago 2003   Migraine with aura, without mention of intractable migraine without mention of status migrainosus 1997   Myalgia and myositis, unspecified 1999   Other and unspecified hyperlipidemia 2003   Other disorders of vitreous 2003   Other malaise and fatigue 2003   Other seborrheic keratosis 2013   Other seborrheic keratosis 04/13/09   Pain in joint, site unspecified 04/13/12   Personal history of fall 04/15/11   Restless legs syndrome (RLS) 1997   Spinocerebellar disease, 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 Dwain Sarna   C3-4 anterior cervical surgery  1999   Dr. Gerlene Fee   CARDIAC CATHETERIZATION  2001   Dr. Aleen Campi   COLONOSCOPY  07/20/07   Dr. 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   Internal hemorrhoids without complication 08/06/2014   Arthritis 02/21/2014   Dysarthria 02/21/2014   Seborrheic keratosis 02/21/2014   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   Tinnitus 04/12/2013   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/16/2012   Impotence, 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   Essential hypertension    GERD (gastroesophageal reflux disease)    Dyslipidemia    Lumbago    Dysphagia     ONSET DATE: 04/17/2022  REFERRING DIAG: G11.9 (ICD-10-CM) - 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HCC) R26.89 (  ICD-10-CM) - Imbalance  THERAPY DIAG:  Difficulty in walking, not elsewhere classified  Other abnormalities of gait and mobility  Ataxic gait  Unsteadiness on feet  Rationale for Evaluation and Treatment: Rehabilitation  SUBJECTIVE:                                                                                                                                                                                             SUBJECTIVE STATEMENT:  Patient reports he is feeling stiff today.   Pt accompanied by:  caregiver  [name]  PERTINENT HISTORY:  Spinocerebellar disease  (HCC) -progressive, continues with supportive care - Ambulatory referral to Physical Therapy      PAIN:  Are you having pain? No  PRECAUTIONS: Fall  WEIGHT BEARING RESTRICTIONS: No  FALLS: Has patient fallen in last 6 months? No; FOF (+), not hesitant to leave the house    OBJECTIVE:  Functional test: 5x STS 05/26/22: 1 min 14 sec 5x STS 06/11/22: 32 sec no UE support 5x STS 07/06/22: 42 sec 5xSTS 08/10/22:   TUG (1/24): 1:27   TODAY'S TREATMENT:    [facility] Adult PT Treatment:                                                DATE: 09/09/2022 Therapeutic Activity: Seated: (Noodle behind back) rows BTB Draw the bow BTB unilateral --> alternating Shoulder extension BTB (low) Side bend stretches Marching --> added opposite arm reach 2#DB Resisted trunk extension (black superband) --> added noodle for thoracic extension stretch Counter: Side stepping with orange super band around upper thighs (stepping against resistance) Squats with hip extension/glute squeeze in standing - orange superband around hips Standing hip extension --> HS curls   [facility] Adult PT Treatment:                                                DATE: 09/07/2022 Therapeutic Activity: Standing VORx1 Y/P Seated: smooth pursuits --> VORc Seated forwards bend/spinal extension stretch (active assist at legs provided for stability) Toe tap on 4" step with opposite HHA --> alternating R/L toe taps + opposite HHAx1 Walking with weighted NBQC (5#AW) CGA   [facility] Adult PT Treatment:  DATE: 09/02/2022 Therapeutic Activity: Seated side bend stretches --> trunk rotation (choir arms) Seated resisted trunk extension (black superband) --> added noodle for thoracic extension stretch Counter: STS + orange super band around waist for hip extension --> lateral weight shifting with orange super band around hips (hip ext) Side stepping with orange super band around waist --> upper thighs  (stepping against resistance) Walking with weighted NBQC (5#AW) CGA   PATIENT EDUCATION: Education details: Updated HEP Person educated: Patient and Caregiver   Education method: Explanation Education comprehension: verbalized understanding  HOME EXERCISE PROGRAM: Access Code: [email or web address] URL: [email or web address] Date: 08/24/2022 Prepared by: Carlynn Herald  Exercises - Sidelying Open Book  - 1 x daily - 7 x weekly - 3 sets - 10 reps - Clam with Resistance  - 1 x daily - 7 x weekly - 3 sets - 10 reps - Sidelying Hip Abduction and Extension with Loop Band  - 1 x daily - 7 x weekly - 3 sets - 10 reps - Lower Trunk Rotations  - 1 x daily - 7 x weekly - 3 sets - 10 reps - Supine Bridge with Mini Swiss Ball Between Knees  - 1 x daily - 7 x weekly - 3 sets - 10 reps - Supine Hamstring Stretch with Strap  - 1 x daily - 7 x weekly - 3 sets - 10 reps - Seated Reach Forward, Up, and To Sides  - 1 x daily - 7 x weekly - 3 sets - 10 reps - Seated Reaching to Side and Across Body  - 2 x daily - 7 x weekly - 2 sets - 10 reps - Sit to Stand with Counter Support  - 1 x daily - 7 x weekly - 3 sets - 10 reps - Seated Shoulder Row with Anchored Resistance  - 1 x daily - 7 x weekly - 3 sets - 10 reps - Seated Anti-Rotation Press With Anchored Resistance  - 1 x daily - 7 x weekly - 3 sets - 10 reps - Seated Trunk Rotation with Anchored Resistance  - 1 x daily - 7 x weekly - 3 sets - 10 reps - Gastroc Stretch on Wall  - 2 x daily - 7 x weekly - 1 sets - 4 reps - 30 sec hold - Standing Heel Raise with Support  - 1 x daily - 7 x weekly - 3 sets - 10 reps - Side to Side Weight Shift with Overhead Reach and Counter Support  - 2 x daily - 7 x weekly - 2 sets - 10 reps - Staggered Stance Forward Backward Weight Shift with Counter Support  - 1 x daily - 7 x weekly - 3 sets - 10 reps - Standing Sidebending with Chair Support  - 1 x daily - 7 x weekly - 3 sets - 10 reps - Mini Squat with Chair  - 1 x  daily - 7 x weekly - 3 sets - 10 reps  GOALS: Goals reviewed with patient? Yes  SHORT TERM GOALS: Target date: 05/27/2022  Will be compliant with appropriate HEP with no more than MinA from caregiver  Baseline: Goal status: MET   2.  Will be able to verbalize at least 3 ways to reduce fall risk at home and in the community  Baseline:  Goal status: MET   3.  Will be able to complete 5x sit to stand in 20 seconds or less with no posterior LOB to show improved functional mobility  Baseline: 1  min 15 sec 05/26/22 Goal status: IN PROGRESS 12/28:IN PROGRESS 06/19/2022: PROGRESSING (1min 4 sec) 07/06/22: PROGRESSING (33 sec)  4.  Will be able to ambulate at least 16450ft with no rest breaks and LRAD  Baseline: 2x75' with one seated rest break using RW 05/26/22 Goal status: IN PROGRESS 12/28: PARTIALLY MET 06/19/2022: MET (160' RW)    LONG TERM GOALS: Target date: 09/22/2022  Will be able to complete TUG in 40 seconds or less with LRAD and no more than Min guard to show improved balance/mobility  Baseline:  Goal status: IN PROGRESS 07/06/22: 1 min 22 sec 08/10/22: 1 min 34 sec  2.  Pt will increase BERG balance score to >/=30/56 to demonstrate improved static balance. Baseline: 19/56 (12/4) Goal status: IN PROGRESS (26/56) on 08/10/22 (updated in flowsheet)  3.  Will be able to navigate single step with LRAD and no more than Min guard for safety  Baseline:  Goal status: MET (CGA/close SBA from caretaker)  4.  Pain in R ankle to be no more than 5/10 at worst  Baseline:  Goal status: MET no pain  5.  Will be able to ambulate at least 23325ft with LRAD, no rest breaks, and Min guard to show improved functional mobility  Baseline:  Goal status: MET     ASSESSMENT:  CLINICAL IMPRESSION:  Resistance added during side stepping to challenge functional hip strengthening and dynamic balance. Cueing and added resistance at hips provided to progress functional hip extension and increase  upright postural alignment in standing.    OBJECTIVE IMPAIRMENTS: Abnormal gait, decreased activity tolerance, decreased balance, decreased coordination, decreased knowledge of use of DME, decreased mobility, difficulty walking, decreased strength, and pain.    PLAN:  PT FREQUENCY: 2x/week  PT DURATION: 6 weeks  PLANNED INTERVENTIONS: Therapeutic exercises, Therapeutic activity, Neuromuscular re-education, Balance training, Gait training, Patient/Family education, Self Care, Joint mobilization, Stair training, DME instructions, Aquatic Therapy, Wheelchair mobility training, Biofeedback, Ionotophoresis 4mg /ml Dexamethasone, Manual therapy, and Re-evaluation  PLAN FOR NEXT SESSION: Progress and challenge dynamic balance and postural stability; counter walking with weighted NBQC; L LE strengthening  Sanjuana MaeKathryn S Stacie Knutzen, PTA 09/09/2022, 2:54 PM

## 2022-09-14 ENCOUNTER — Ambulatory Visit: Payer: Medicare Other | Admitting: Physical Therapy

## 2022-09-14 ENCOUNTER — Encounter: Payer: Self-pay | Admitting: Physical Therapy

## 2022-09-14 DIAGNOSIS — R260 Ataxic gait: Secondary | ICD-10-CM | POA: Diagnosis not present

## 2022-09-14 DIAGNOSIS — R2689 Other abnormalities of gait and mobility: Secondary | ICD-10-CM | POA: Diagnosis not present

## 2022-09-14 DIAGNOSIS — M79671 Pain in right foot: Secondary | ICD-10-CM

## 2022-09-14 DIAGNOSIS — R2681 Unsteadiness on feet: Secondary | ICD-10-CM

## 2022-09-14 DIAGNOSIS — R262 Difficulty in walking, not elsewhere classified: Secondary | ICD-10-CM

## 2022-09-14 NOTE — Therapy (Signed)
OUTPATIENT PHYSICAL THERAPY NEURO TREATMENT   Patient Name: Justin [name] MRN: [id number] DOB:[birth year], 82 y.o., male Today's Date: 09/14/2022  PCP: Abbey Chatters  REFERRING PROVIDER: Sharon Seller, NP    END OF SESSION:  PT End of Session - 09/14/22 1114     Visit Number 38    Number of Visits 41    Date for PT Re-Evaluation 09/22/22    Authorization Type [employer]BS MCR    Authorization Time Period 04/29/22 to 06/24/22    PT Start Time 1102    PT Stop Time 1143    PT Time Calculation (min) 41 min    Activity Tolerance Patient tolerated treatment well    Behavior During Therapy [facility] for tasks assessed/performed              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   Diaphragmatic hernia without mention of obstruction or gangrene 1982   Hiatal hernia   Diplopia 2006   Disturbance of skin sensation 2003   Diverticulosis of colon (without mention of hemorrhage) 1997   Dizziness and giddiness 2001   Dysphagia, unspecified(787.20) 1999 and   Esophagitis, unspecified 1997   GERD (gastroesophageal reflux disease) 2003   Hypertension 2003   Impotence of organic origin 1999   Lack of coordination September 07, 2007   Lumbago 2003   Migraine with aura, without mention of intractable migraine without mention of status migrainosus 1997   Myalgia and myositis, unspecified 1999   Other and unspecified hyperlipidemia 2003   Other disorders of vitreous 2003   Other malaise and fatigue 2003   Other seborrheic keratosis 2013   Other seborrheic keratosis 04/13/09   Pain in joint, site unspecified 04/13/12   Personal history of fall 04/15/11   Restless legs syndrome (RLS)  1997   Spinocerebellar disease, 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 Dwain Sarna   C3-4 anterior cervical surgery  1999   Dr. Gerlene Fee   CARDIAC CATHETERIZATION  2001   Dr. Aleen Campi   COLONOSCOPY  07/20/07   Dr. 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   Internal hemorrhoids without complication 08/06/2014   Arthritis 02/21/2014   Dysarthria 02/21/2014   Seborrheic keratosis 02/21/2014   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   Tinnitus 04/12/2013   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/16/2012   Impotence, 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   Essential hypertension    GERD (gastroesophageal reflux disease)    Dyslipidemia    Lumbago    Dysphagia     ONSET DATE: 04/17/2022  REFERRING DIAG: G11.9 (ICD-10-CM) - 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HCC) R26.89 (ICD-10-CM) - Imbalance  THERAPY DIAG:  Difficulty in walking, not elsewhere classified  Other abnormalities of gait and mobility  Ataxic gait  Unsteadiness on feet  Pain of right heel  Rationale for Evaluation and Treatment: Rehabilitation  SUBJECTIVE:                                                                                                                                                                                             SUBJECTIVE STATEMENT:   Doing OK, legs have not been wanting to work right the past  couple days. Usually walk some after breakfast, but the past couple of days feels like I've been struggling to move my legs due to stiffness. No falls or close calls since last time.   Pt accompanied by:  caregiver  [name]  PERTINENT HISTORY:  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HCC) -progressive, continues with supportive care - Ambulatory referral to Physical Therapy      PAIN:  Are you having pain? No 0/10  PRECAUTIONS: Fall  WEIGHT BEARING RESTRICTIONS: No  FALLS: Has patient fallen in last 6 months? No; FOF (+), not hesitant to leave the house    OBJECTIVE:  Functional test: 5x STS 05/26/22: 1 min 14 sec 5x STS 06/11/22: 32 sec no UE support 5x STS 07/06/22: 42 sec 5xSTS 08/10/22:   TUG (1/24): 1:27   TODAY'S TREATMENT:    [facility] Adult PT Treatment:                                                DATE:    09/14/22  TherEx  Nustep L6x6 minutes BLEs only Trunk rotation/lateral wt shifts at edge of mat table x10 B Forward reaches to basketball for notes over toes and lumbar mobility x10 Piriformis stretches 2x15 seconds B     Gait  Approximately 34ft, 20ft, 27ft, 42ft, 75ft with walker    NMR  Standing wide BOS no UEs/min guard to occasional MinA from PT, 2 rounds with progressive narrowing of BOS       09/09/2022 Therapeutic Activity: Seated: (Noodle behind back) rows BTB Draw the bow BTB unilateral --> alternating Shoulder extension BTB (low) Side bend stretches Marching --> added opposite arm reach 2#DB Resisted trunk extension (black superband) --> added noodle for thoracic extension stretch Counter: Side stepping with orange super band around upper thighs (stepping against resistance) Squats with hip extension/glute squeeze in standing - orange superband around hips Standing hip extension -->  HS curls   [facility] Adult PT Treatment:                                                DATE: 09/07/2022 Therapeutic Activity: Standing VORx1 Y/P Seated: smooth pursuits  --> VORc Seated forwards bend/spinal extension stretch (active assist at legs provided for stability) Toe tap on 4" step with opposite HHA --> alternating R/L toe taps + opposite HHAx1 Walking with weighted NBQC (5#AW) CGA   [facility] Adult PT Treatment:                                                DATE: 09/02/2022 Therapeutic Activity: Seated side bend stretches --> trunk rotation (choir arms) Seated resisted trunk extension (black superband) --> added noodle for thoracic extension stretch Counter: STS + orange super band around waist for hip extension --> lateral weight shifting with orange super band around hips (hip ext) Side stepping with orange super band around waist --> upper thighs (stepping against resistance) Walking with weighted NBQC (5#AW) CGA   PATIENT EDUCATION: Education details: Updated HEP Person educated: Patient and Caregiver   Education method: Explanation Education comprehension: verbalized understanding  HOME EXERCISE PROGRAM: Access Code: [email or web address] URL: [email or web address] Date: 08/24/2022 Prepared by: Carlynn Herald  Exercises - Sidelying Open Book  - 1 x daily - 7 x weekly - 3 sets - 10 reps - Clam with Resistance  - 1 x daily - 7 x weekly - 3 sets - 10 reps - Sidelying Hip Abduction and Extension with Loop Band  - 1 x daily - 7 x weekly - 3 sets - 10 reps - Lower Trunk Rotations  - 1 x daily - 7 x weekly - 3 sets - 10 reps - Supine Bridge with Mini Swiss Ball Between Knees  - 1 x daily - 7 x weekly - 3 sets - 10 reps - Supine Hamstring Stretch with Strap  - 1 x daily - 7 x weekly - 3 sets - 10 reps - Seated Reach Forward, Up, and To Sides  - 1 x daily - 7 x weekly - 3 sets - 10 reps - Seated Reaching to Side and Across Body  - 2 x daily - 7 x weekly - 2 sets - 10 reps - Sit to Stand with Counter Support  - 1 x daily - 7 x weekly - 3 sets - 10 reps - Seated Shoulder Row with Anchored Resistance  - 1 x daily - 7 x weekly - 3 sets - 10 reps -  Seated Anti-Rotation Press With Anchored Resistance  - 1 x daily - 7 x weekly - 3 sets - 10 reps - Seated Trunk Rotation with Anchored Resistance  - 1 x daily - 7 x weekly - 3 sets - 10 reps - Gastroc Stretch on Wall  - 2 x daily - 7 x weekly - 1 sets - 4 reps - 30 sec hold - Standing Heel Raise with Support  - 1 x daily - 7 x weekly - 3 sets - 10 reps - Side to Side Weight Shift with Overhead Reach and Counter Support  - 2 x daily - 7 x weekly - 2 sets - 10 reps - Staggered Stance Forward  Backward Weight Shift with Counter Support  - 1 x daily - 7 x weekly - 3 sets - 10 reps - Standing Sidebending with Chair Support  - 1 x daily - 7 x weekly - 3 sets - 10 reps - Mini Squat with Chair  - 1 x daily - 7 x weekly - 3 sets - 10 reps  GOALS: Goals reviewed with patient? Yes  SHORT TERM GOALS: Target date: 05/27/2022  Will be compliant with appropriate HEP with no more than MinA from caregiver  Baseline: Goal status: MET   2.  Will be able to verbalize at least 3 ways to reduce fall risk at home and in the community  Baseline:  Goal status: MET   3.  Will be able to complete 5x sit to stand in 20 seconds or less with no posterior LOB to show improved functional mobility  Baseline: 1 min 15 sec 05/26/22 Goal status: IN PROGRESS 12/28:IN PROGRESS 06/19/2022: PROGRESSING ( 4 sec) 07/06/22: PROGRESSING (33 sec)  4.  Will be able to ambulate at least 161ft with no rest breaks and LRAD  Baseline: 2x75' with one seated rest break using RW 05/26/22 Goal status: IN PROGRESS 12/28: PARTIALLY MET 06/19/2022: MET (160' RW)    LONG TERM GOALS: Target date: 09/22/2022  Will be able to complete TUG in 40 seconds or less with LRAD and no more than Min guard to show improved balance/mobility  Baseline:  Goal status: IN PROGRESS 07/06/22: 1 min 22 sec 08/10/22: 1 min 34 sec  2.  Pt will increase BERG balance score to >/=30/56 to demonstrate improved static balance. Baseline: 19/56 (12/4) Goal  status: IN PROGRESS (26/56) on 08/10/22 (updated in flowsheet)  3.  Will be able to navigate single step with LRAD and no more than Min guard for safety  Baseline:  Goal status: MET (CGA/close SBA from caretaker)  4.  Pain in R ankle to be no more than 5/10 at worst  Baseline:  Goal status: MET no pain  5.  Will be able to ambulate at least 25ft with LRAD, no rest breaks, and Min guard to show improved functional mobility  Baseline:  Goal status: MET     ASSESSMENT:  CLINICAL IMPRESSION:   Yehuda Mao arrives today with his caregiver, both very pleasant and cooperative with PT session. Has been feeling much more stiff and has been having a harder time walking the past couple of days due to this. Warmed up on Nustep today then worked on a bit of general mobility and flexibility before focusing on gait/balance per his request. Will need re-assessment and recert this session, per discussion with treating PTA he is likely ready for transition to maintenance program soon.   OBJECTIVE IMPAIRMENTS: Abnormal gait, decreased activity tolerance, decreased balance, decreased coordination, decreased knowledge of use of DME, decreased mobility, difficulty walking, decreased strength, and pain.    PLAN:  PT FREQUENCY: 2x/week  PT DURATION: 6 weeks  PLANNED INTERVENTIONS: Therapeutic exercises, Therapeutic activity, Neuromuscular re-education, Balance training, Gait training, Patient/Family education, Self Care, Joint mobilization, Stair training, DME instructions, Aquatic Therapy, Wheelchair mobility training, Biofeedback, Ionotophoresis /ml Dexamethasone, Manual therapy, and Re-evaluation  PLAN FOR NEXT SESSION: Progress and challenge dynamic balance and postural stability; counter walking with weighted NBQC; L LE strengthening.  Needs recert/reval next session.   Nedra Hai PT DPT PN2

## 2022-09-16 ENCOUNTER — Ambulatory Visit: Payer: Medicare Other | Admitting: Physical Therapy

## 2022-09-16 ENCOUNTER — Encounter: Payer: Self-pay | Admitting: Physical Therapy

## 2022-09-16 DIAGNOSIS — R260 Ataxic gait: Secondary | ICD-10-CM

## 2022-09-16 DIAGNOSIS — R2689 Other abnormalities of gait and mobility: Secondary | ICD-10-CM

## 2022-09-16 DIAGNOSIS — R2681 Unsteadiness on feet: Secondary | ICD-10-CM

## 2022-09-16 DIAGNOSIS — M79671 Pain in right foot: Secondary | ICD-10-CM | POA: Diagnosis not present

## 2022-09-16 DIAGNOSIS — R262 Difficulty in walking, not elsewhere classified: Secondary | ICD-10-CM

## 2022-09-16 NOTE — Therapy (Signed)
OUTPATIENT PHYSICAL THERAPY NEURO TREATMENT AND RECERTIFICATION  Patient Name: Justin [name] MRN: [id number] DOB:02-23-1941, 82 y.o., male Today's Date: 09/16/2022  PCP: Abbey Chatters  REFERRING PROVIDER: Sharon Seller, NP    END OF SESSION:  PT End of Session - 09/16/22 1527     Visit Number 39    Number of Visits 45    Date for PT Re-Evaluation 10/28/22    Authorization - Number of Visits 39    Progress Note Due on Visit 40    PT Start Time 1445    PT Stop Time 1528    PT Time Calculation (min) 43 min    Activity Tolerance Patient tolerated treatment well    Behavior During Therapy [facility] for tasks assessed/performed               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   Diaphragmatic hernia without mention of obstruction or gangrene 1982   Hiatal hernia   Diplopia 2006   Disturbance of skin sensation 2003   Diverticulosis of colon (without mention of hemorrhage) 1997   Dizziness and giddiness 2001   Dysphagia, unspecified(787.20) 1999 and   Esophagitis, unspecified 1997   GERD (gastroesophageal reflux disease) 2003   Hypertension 2003   Impotence of organic origin 1999   Lack of coordination September 07, 2007   Lumbago 2003   Migraine with aura, without mention of intractable migraine without mention of status migrainosus 1997   Myalgia and myositis, unspecified 1999   Other and unspecified hyperlipidemia 2003   Other disorders of vitreous 2003   Other malaise and fatigue 2003   Other seborrheic keratosis 2013   Other seborrheic keratosis 04/13/09   Pain in joint, site unspecified 04/13/12   Personal history of fall 04/15/11   Restless legs  syndrome (RLS) 1997   Spinocerebellar disease, 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 Dwain Sarna   C3-4 anterior cervical surgery  1999   Dr. Gerlene Fee   CARDIAC CATHETERIZATION  2001   Dr. Aleen Campi   COLONOSCOPY  07/20/07   Dr. 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   Internal hemorrhoids without complication 08/06/2014   Arthritis 02/21/2014   Dysarthria 02/21/2014   Seborrheic keratosis 02/21/2014   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   Tinnitus 04/12/2013   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/16/2012   Impotence, 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   Essential hypertension    GERD (gastroesophageal reflux disease)    Dyslipidemia    Lumbago    Dysphagia  ONSET DATE: 04/17/2022  REFERRING DIAG: G11.9 (ICD-10-CM) - 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HCC) R26.89 (ICD-10-CM) - Imbalance  THERAPY DIAG:  Difficulty in walking, not elsewhere classified  Other abnormalities of gait and mobility  Ataxic gait  Unsteadiness on feet  Rationale for Evaluation and Treatment: Rehabilitation  SUBJECTIVE:                                                                                                                                                                                             SUBJECTIVE STATEMENT:   Pt states he is feeling good today. States his legs are a little stiff  but he is feeling better overall  Pt accompanied by:  caregiver  [name]  PERTINENT HISTORY:  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HCC) -progressive, continues with supportive care - Ambulatory referral to Physical Therapy      PAIN:  Are you having pain? No 0/10  PRECAUTIONS: Fall  WEIGHT BEARING RESTRICTIONS: No  FALLS: Has patient fallen in last 6 months? No; FOF (+), not hesitant to leave the house    OBJECTIVE:  Functional test: 5x STS 05/26/22: 1 min 14 sec 5x STS 06/11/22: 32 sec no UE support 5x STS 07/06/22: 42 sec 5xSTS 09/16/22: 50 sec  TUG (1/24): 1:27   TODAY'S TREATMENT:   [facility] Adult PT Treatment:                                                DATE: 09/16/22 Neuromuscular re-ed: Side stepping with orange super band around upper thighs Squats with hip extension/glute squeeze in standing - orange superband around hips Standing hip ext Standing HS curl Seated PWR twist x 10 Therapeutic Activity: 5x STS: 50 seconds Seated with noodle behind back: rows blue TB, shoulder ext blue TB, bow and arrow blue TB Seated march with opp UE lift 2# Gait: Gait speed assessment 0.51 ft/sec Gait with RW 110', 40'   [facility] Adult PT Treatment:                                                DATE:    09/14/22  TherEx  Nustep L6x6 minutes BLEs only Trunk rotation/lateral wt shifts at edge of mat table x10 B Forward reaches to basketball for notes over toes and lumbar mobility x10 Piriformis stretches 2x15 seconds B     Gait  Approximately 53ft, 33ft, 39ft,  2ft, 67ft with walker    NMR  Standing wide BOS no UEs/min guard to occasional MinA from PT, 2 rounds with progressive narrowing of BOS       09/09/2022 Therapeutic Activity: Seated: (Noodle behind back) rows BTB Draw the bow BTB unilateral --> alternating Shoulder extension BTB (low) Side bend stretches Marching --> added opposite arm reach 2#DB Resisted trunk extension (black superband) --> added noodle for  thoracic extension stretch Counter: Side stepping with orange super band around upper thighs (stepping against resistance) Squats with hip extension/glute squeeze in standing - orange superband around hips Standing hip extension --> HS curls   [facility] Adult PT Treatment:                                                DATE: 09/07/2022 Therapeutic Activity: Standing VORx1 Y/P Seated: smooth pursuits --> VORc Seated forwards bend/spinal extension stretch (active assist at legs provided for stability) Toe tap on 4" step with opposite HHA --> alternating R/L toe taps + opposite HHAx1 Walking with weighted NBQC (5#AW) CGA  PATIENT EDUCATION: Education details: Updated HEP Person educated: Patient and Caregiver   Education method: Explanation Education comprehension: verbalized understanding  HOME EXERCISE PROGRAM: Access Code: [email or web address] URL: [email or web address] Date: 08/24/2022 Prepared by: Carlynn Herald  Exercises - Sidelying Open Book  - 1 x daily - 7 x weekly - 3 sets - 10 reps - Clam with Resistance  - 1 x daily - 7 x weekly - 3 sets - 10 reps - Sidelying Hip Abduction and Extension with Loop Band  - 1 x daily - 7 x weekly - 3 sets - 10 reps - Lower Trunk Rotations  - 1 x daily - 7 x weekly - 3 sets - 10 reps - Supine Bridge with Mini Swiss Ball Between Knees  - 1 x daily - 7 x weekly - 3 sets - 10 reps - Supine Hamstring Stretch with Strap  - 1 x daily - 7 x weekly - 3 sets - 10 reps - Seated Reach Forward, Up, and To Sides  - 1 x daily - 7 x weekly - 3 sets - 10 reps - Seated Reaching to Side and Across Body  - 2 x daily - 7 x weekly - 2 sets - 10 reps - Sit to Stand with Counter Support  - 1 x daily - 7 x weekly - 3 sets - 10 reps - Seated Shoulder Row with Anchored Resistance  - 1 x daily - 7 x weekly - 3 sets - 10 reps - Seated Anti-Rotation Press With Anchored Resistance  - 1 x daily - 7 x weekly - 3 sets - 10 reps - Seated Trunk Rotation with Anchored Resistance   - 1 x daily - 7 x weekly - 3 sets - 10 reps - Gastroc Stretch on Wall  - 2 x daily - 7 x weekly - 1 sets - 4 reps - 30 sec hold - Standing Heel Raise with Support  - 1 x daily - 7 x weekly - 3 sets - 10 reps - Side to Side Weight Shift with Overhead Reach and Counter Support  - 2 x daily - 7 x weekly - 2 sets - 10 reps - Staggered Stance Forward Backward Weight Shift with Counter Support  - 1 x daily - 7 x weekly - 3 sets -  10 reps - Standing Sidebending with Chair Support  - 1 x daily - 7 x weekly - 3 sets - 10 reps - Mini Squat with Chair  - 1 x daily - 7 x weekly - 3 sets - 10 reps  GOALS: Goals reviewed with patient? Yes  SHORT TERM GOALS: Target date: 05/27/2022  Will be compliant with appropriate HEP with no more than MinA from caregiver  Baseline: Goal status: MET   2.  Will be able to verbalize at least 3 ways to reduce fall risk at home and in the community  Baseline:  Goal status: MET   3.  Will be able to complete 5x sit to stand in 20 seconds or less with no posterior LOB to show improved functional mobility  Baseline: 1 min 15 sec 05/26/22 Goal status: IN PROGRESS 12/28:IN PROGRESS 06/19/2022: PROGRESSING ( 4 sec) 07/06/22: PROGRESSING (33 sec)  4.  Will be able to ambulate at least 13ft with no rest breaks and LRAD  Baseline: 2x75' with one seated rest break using RW 05/26/22 Goal status: IN PROGRESS 12/28: PARTIALLY MET 06/19/2022: MET (160' RW)    LONG TERM GOALS: Target date: 10/28/2022    Will be able to complete TUG in 40 seconds or less with LRAD and no more than Min guard to show improved balance/mobility  Baseline:  Goal status: IN PROGRESS 07/06/22: 1 min 22 sec 08/10/22: 1 min 34 sec  2.  Pt will increase BERG balance score to >/=30/56 to demonstrate improved static balance. Baseline: 19/56 (12/4) Goal status: IN PROGRESS (26/56) on 08/10/22 (updated in flowsheet)  3.  Will be able to navigate single step with LRAD and no more than Min guard for  safety  Baseline:  Goal status: MET (CGA/close SBA from caretaker)  4.  Pain in R ankle to be no more than 5/10 at worst  Baseline:  Goal status: MET no pain  5.  Will be able to ambulate at least 251ft with LRAD, no rest breaks, and Min guard to show improved functional mobility  Baseline:  Goal status: MET  6.  Pt will improve gait speed to >= 0.65 ft/sec to demo decreased fall risk Baseline:  Goal status: INITIAL     ASSESSMENT:  CLINICAL IMPRESSION:   Pt demo's much improved posture, strength and activity tolerance. He continues with decreased muscle strength and decreased gait speed indicating a continued fall risk. Pt will benefit from continued skilled PT services to progress strength, gait and balance to work towards remaining goals  OBJECTIVE IMPAIRMENTS: Abnormal gait, decreased activity tolerance, decreased balance, decreased coordination, decreased knowledge of use of DME, decreased mobility, difficulty walking, decreased strength, and pain.    PLAN:  PT FREQUENCY: 2x/week  PT DURATION: 6 weeks  PLANNED INTERVENTIONS: Therapeutic exercises, Therapeutic activity, Neuromuscular re-education, Balance training, Gait training, Patient/Family education, Self Care, Joint mobilization, Stair training, DME instructions, Aquatic Therapy, Wheelchair mobility training, Biofeedback, Ionotophoresis /ml Dexamethasone, Manual therapy, and Re-evaluation  PLAN FOR NEXT SESSION: Progress and challenge dynamic balance and postural stability; counter walking with weighted NBQC; L LE strengthening.    Reggy Eye, PT,DPT04/17/243:29 PM

## 2022-09-23 ENCOUNTER — Ambulatory Visit: Payer: Medicare Other | Admitting: Physical Therapy

## 2022-09-23 ENCOUNTER — Encounter: Payer: Self-pay | Admitting: Physical Therapy

## 2022-09-23 DIAGNOSIS — R2681 Unsteadiness on feet: Secondary | ICD-10-CM | POA: Diagnosis not present

## 2022-09-23 DIAGNOSIS — R2689 Other abnormalities of gait and mobility: Secondary | ICD-10-CM

## 2022-09-23 DIAGNOSIS — R260 Ataxic gait: Secondary | ICD-10-CM

## 2022-09-23 DIAGNOSIS — R262 Difficulty in walking, not elsewhere classified: Secondary | ICD-10-CM

## 2022-09-23 DIAGNOSIS — M79671 Pain in right foot: Secondary | ICD-10-CM | POA: Diagnosis not present

## 2022-09-23 NOTE — Therapy (Signed)
OUTPATIENT PHYSICAL THERAPY NEURO TREATMENT AND 40th visit note  Patient Name: Justin [name] MRN: [id number] DOB:03-19-1941, 82 y.o., male Today's Date: 09/23/2022 Dates of service 04/29/22-09/23/22 PCP: Abbey Chatters  REFERRING PROVIDER: Sharon Seller, NP    END OF SESSION:  PT End of Session - 09/23/22 1141     Visit Number 40    Number of Visits 45    Date for PT Re-Evaluation 10/28/22    Authorization - Number of Visits 40    Progress Note Due on Visit 50    PT Start Time 1100    PT Stop Time 1145    PT Time Calculation (min) 45 min    Activity Tolerance Patient tolerated treatment well    Behavior During Therapy [facility] for tasks assessed/performed                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   Diaphragmatic hernia without mention of obstruction or gangrene 1982   Hiatal hernia   Diplopia 2006   Disturbance of skin sensation 2003   Diverticulosis of colon (without mention of hemorrhage) 1997   Dizziness and giddiness 2001   Dysphagia, unspecified(787.20) 1999 and   Esophagitis, unspecified 1997   GERD (gastroesophageal reflux disease) 2003   Hypertension 2003   Impotence of organic origin 1999   Lack of coordination September 07, 2007   Lumbago 2003   Migraine with aura, without mention of intractable migraine without mention of status migrainosus 1997   Myalgia and myositis, unspecified 1999   Other and unspecified hyperlipidemia 2003   Other disorders of vitreous 2003   Other malaise and fatigue 2003   Other seborrheic keratosis 2013   Other seborrheic keratosis 04/13/09   Pain in joint, site unspecified 04/13/12   Personal history of  fall 04/15/11   Restless legs syndrome (RLS) 1997   Spinocerebellar disease, 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 Dwain Sarna   C3-4 anterior cervical surgery  1999   Dr. Gerlene Fee   CARDIAC CATHETERIZATION  2001   Dr. Aleen Campi   COLONOSCOPY  07/20/07   Dr. 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   Internal hemorrhoids without complication 08/06/2014   Arthritis 02/21/2014   Dysarthria 02/21/2014   Seborrheic keratosis 02/21/2014   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   Tinnitus 04/12/2013   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/16/2012   Impotence, 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   Essential hypertension    GERD (gastroesophageal reflux disease)    Dyslipidemia    Lumbago  Dysphagia     ONSET DATE: 04/17/2022  REFERRING DIAG: G11.9 (ICD-10-CM) - 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HCC) R26.89 (ICD-10-CM) - Imbalance  THERAPY DIAG:  Difficulty in walking, not elsewhere classified  Other abnormalities of gait and mobility  Ataxic gait  Unsteadiness on feet  Rationale for Evaluation and Treatment: Rehabilitation  SUBJECTIVE:                                                                                                                                                                                             SUBJECTIVE STATEMENT:   Pt states he is feeling good today.  States his legs are a little stiff but he is feeling better overall  Pt accompanied by:  caregiver  [name]  PERTINENT HISTORY:  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HCC) -progressive, continues with supportive care - Ambulatory referral to Physical Therapy      PAIN:  Are you having pain? No 0/10  PRECAUTIONS: Fall  WEIGHT BEARING RESTRICTIONS: No  FALLS: Has patient fallen in last 6 months? No; FOF (+), not hesitant to leave the house    OBJECTIVE:  Functional test: 5x STS 05/26/22: 1 min 14 sec 5x STS 06/11/22: 32 sec no UE support 5x STS 07/06/22: 42 sec 5xSTS 09/16/22: 50 sec  TUG (1/24): 1:27   TODAY'S TREATMENT:   [facility] Adult PT Treatment:                                                DATE: 09/23/22 Therapeutic Exercise: Nustep L6 x 5 min for UE/LE coordination, AAROM and warm up  Neuromuscular re-ed: Gait with NBQC forward/backward at counter with CGA Sit to stand with red power band around waist focus on trunk and hip extension Side step at counter with tapping numbers on cabinets for trunk ext with functional activity, then sidestep over noodles with tapping numbers on cabinets PWR twist with ball pass between hands Seated march with 1kg ball overhead 2 x 10 Seated trunk diagonals 1kg ball x 10 bilat Seated deadlift 10# KB x 10    [facility] Adult PT Treatment:                                                DATE: 09/16/22 Neuromuscular re-ed: Side stepping with orange super band around upper thighs Squats with hip extension/glute squeeze in standing - orange superband around hips Standing  hip ext Standing HS curl Seated PWR twist x 10 Therapeutic Activity: 5x STS: 50 seconds Seated with noodle behind back: rows blue TB, shoulder ext blue TB, bow and arrow blue TB Seated march with opp UE lift 2# Gait: Gait speed assessment 0.51 ft/sec Gait with RW 110', 40'   [facility] Adult PT Treatment:                                                DATE:    09/14/22  TherEx  Nustep  L6x6 minutes BLEs only Trunk rotation/lateral wt shifts at edge of mat table x10 B Forward reaches to basketball for notes over toes and lumbar mobility x10 Piriformis stretches 2x15 seconds B     Gait  Approximately 48ft, 68ft, 90ft, 62ft, 33ft with walker    NMR  Standing wide BOS no UEs/min guard to occasional MinA from PT, 2 rounds with progressive narrowing of BOS       09/09/2022 Therapeutic Activity: Seated: (Noodle behind back) rows BTB Draw the bow BTB unilateral --> alternating Shoulder extension BTB (low) Side bend stretches Marching --> added opposite arm reach 2#DB Resisted trunk extension (black superband) --> added noodle for thoracic extension stretch Counter: Side stepping with orange super band around upper thighs (stepping against resistance) Squats with hip extension/glute squeeze in standing - orange superband around hips Standing hip extension --> HS curls    PATIENT EDUCATION: Education details: Updated HEP Person educated: Patient and Caregiver   Education method: Explanation Education comprehension: verbalized understanding  HOME EXERCISE PROGRAM: Access Code: [email or web address] URL: [email or web address] Date: 08/24/2022 Prepared by: Carlynn Herald  Exercises - Sidelying Open Book  - 1 x daily - 7 x weekly - 3 sets - 10 reps - Clam with Resistance  - 1 x daily - 7 x weekly - 3 sets - 10 reps - Sidelying Hip Abduction and Extension with Loop Band  - 1 x daily - 7 x weekly - 3 sets - 10 reps - Lower Trunk Rotations  - 1 x daily - 7 x weekly - 3 sets - 10 reps - Supine Bridge with Mini Swiss Ball Between Knees  - 1 x daily - 7 x weekly - 3 sets - 10 reps - Supine Hamstring Stretch with Strap  - 1 x daily - 7 x weekly - 3 sets - 10 reps - Seated Reach Forward, Up, and To Sides  - 1 x daily - 7 x weekly - 3 sets - 10 reps - Seated Reaching to Side and Across Body  - 2 x daily - 7 x weekly - 2 sets - 10 reps - Sit to Stand with Counter  Support  - 1 x daily - 7 x weekly - 3 sets - 10 reps - Seated Shoulder Row with Anchored Resistance  - 1 x daily - 7 x weekly - 3 sets - 10 reps - Seated Anti-Rotation Press With Anchored Resistance  - 1 x daily - 7 x weekly - 3 sets - 10 reps - Seated Trunk Rotation with Anchored Resistance  - 1 x daily - 7 x weekly - 3 sets - 10 reps - Gastroc Stretch on Wall  - 2 x daily - 7 x weekly - 1 sets - 4 reps - 30 sec hold - Standing Heel Raise with Support  - 1 x daily - 7  x weekly - 3 sets - 10 reps - Side to Side Weight Shift with Overhead Reach and Counter Support  - 2 x daily - 7 x weekly - 2 sets - 10 reps - Staggered Stance Forward Backward Weight Shift with Counter Support  - 1 x daily - 7 x weekly - 3 sets - 10 reps - Standing Sidebending with Chair Support  - 1 x daily - 7 x weekly - 3 sets - 10 reps - Mini Squat with Chair  - 1 x daily - 7 x weekly - 3 sets - 10 reps  GOALS: Goals reviewed with patient? Yes  SHORT TERM GOALS: Target date: 05/27/2022  Will be compliant with appropriate HEP with no more than MinA from caregiver  Baseline: Goal status: MET   2.  Will be able to verbalize at least 3 ways to reduce fall risk at home and in the community  Baseline:  Goal status: MET   3.  Will be able to complete 5x sit to stand in 20 seconds or less with no posterior LOB to show improved functional mobility  Baseline: 1 min 15 sec 05/26/22 Goal status: IN PROGRESS 12/28:IN PROGRESS 06/19/2022: PROGRESSING ( 4 sec) 07/06/22: PROGRESSING (33 sec)  4.  Will be able to ambulate at least 124ft with no rest breaks and LRAD  Baseline: 2x75' with one seated rest break using RW 05/26/22 Goal status: IN PROGRESS 12/28: PARTIALLY MET 06/19/2022: MET (160' RW)    LONG TERM GOALS: Target date: 10/28/2022    Will be able to complete TUG in 40 seconds or less with LRAD and no more than Min guard to show improved balance/mobility  Baseline:  Goal status: IN PROGRESS 07/06/22: 1 min 22  sec 08/10/22: 1 min 34 sec  2.  Pt will increase BERG balance score to >/=30/56 to demonstrate improved static balance. Baseline: 19/56 (12/4) Goal status: IN PROGRESS (26/56) on 08/10/22 (updated in flowsheet)  3.  Will be able to navigate single step with LRAD and no more than Min guard for safety  Baseline:  Goal status: MET (CGA/close SBA from caretaker)  4.  Pain in R ankle to be no more than 5/10 at worst  Baseline:  Goal status: MET no pain  5.  Will be able to ambulate at least 259ft with LRAD, no rest breaks, and Min guard to show improved functional mobility  Baseline:  Goal status: MET  6.  Pt will improve gait speed to >= 0.65 ft/sec to demo decreased fall risk Baseline:  Goal status: INITIAL     ASSESSMENT:  CLINICAL IMPRESSION: Pt challenged by side step over objects and backward walking. Much improved trunk and hip strength noted with postural activities  OBJECTIVE IMPAIRMENTS: Abnormal gait, decreased activity tolerance, decreased balance, decreased coordination, decreased knowledge of use of DME, decreased mobility, difficulty walking, decreased strength, and pain.    PLAN:  PT FREQUENCY: 2x/week  PT DURATION: 6 weeks  PLANNED INTERVENTIONS: Therapeutic exercises, Therapeutic activity, Neuromuscular re-education, Balance training, Gait training, Patient/Family education, Self Care, Joint mobilization, Stair training, DME instructions, Aquatic Therapy, Wheelchair mobility training, Biofeedback, Ionotophoresis /ml Dexamethasone, Manual therapy, and Re-evaluation  PLAN FOR NEXT SESSION: Progress and challenge dynamic balance and postural stability; counter walking with weighted NBQC; L LE strengthening.    Reggy Eye, PT,DPT04/24/2411:44 AM

## 2022-09-25 ENCOUNTER — Ambulatory Visit (INDEPENDENT_AMBULATORY_CARE_PROVIDER_SITE_OTHER): Payer: Medicare Other | Admitting: Nurse Practitioner

## 2022-09-25 ENCOUNTER — Encounter: Payer: Self-pay | Admitting: Nurse Practitioner

## 2022-09-25 VITALS — BP 132/84 | HR 56 | Ht 67.0 in | Wt 182.2 lb

## 2022-09-25 DIAGNOSIS — I10 Essential (primary) hypertension: Secondary | ICD-10-CM | POA: Diagnosis not present

## 2022-09-25 DIAGNOSIS — E782 Mixed hyperlipidemia: Secondary | ICD-10-CM | POA: Diagnosis not present

## 2022-09-25 DIAGNOSIS — R739 Hyperglycemia, unspecified: Secondary | ICD-10-CM

## 2022-09-25 DIAGNOSIS — R0989 Other specified symptoms and signs involving the circulatory and respiratory systems: Secondary | ICD-10-CM | POA: Diagnosis not present

## 2022-09-25 DIAGNOSIS — G118 Other hereditary ataxias: Secondary | ICD-10-CM | POA: Diagnosis not present

## 2022-09-25 DIAGNOSIS — QA00102 CACNA1A-related neurodevelopmental disorder: Secondary | ICD-10-CM

## 2022-09-25 DIAGNOSIS — L989 Disorder of the skin and subcutaneous tissue, unspecified: Secondary | ICD-10-CM

## 2022-09-25 DIAGNOSIS — N3281 Overactive bladder: Secondary | ICD-10-CM

## 2022-09-25 LAB — CBC WITH DIFFERENTIAL/PLATELET
Absolute Monocytes: 846 cells/uL (ref 200–950)
Basophils Absolute: 64 cells/uL (ref 0–200)
Eosinophils Absolute: 202 cells/uL (ref 15–500)
Eosinophils Relative: 2.2 %
HCT: 43.2 % (ref 38.5–50.0)
Hemoglobin: 14.9 g/dL (ref 13.2–17.1)
Neutro Abs: 4462 cells/uL (ref 1500–7800)
RBC: 4.89 10*6/uL (ref 4.20–5.80)
Total Lymphocyte: 39.4 %

## 2022-09-25 NOTE — Patient Instructions (Signed)
To decrease oxybutynin- half tablet for 1 week then stop

## 2022-09-25 NOTE — Progress Notes (Unsigned)
Careteam: Patient Care Team: Sharon Seller, NP as PCP - General (Geriatric Medicine) Nahser, Deloris Ping, MD as PCP - Cardiology (Cardiology) Tat, Octaviano Batty, DO as Consulting Physician (Neurology) Gelene Mink, OD as Referring Physician (Optometry)  PLACE OF SERVICE:  [facility]  Advanced Directive information    Allergies  Allergen Reactions   Effexor [Venlafaxine Hcl]    Serzone [Nefazodone]    Vivactil [Protriptyline Hcl]     Chief Complaint  Patient presents with   Medical Management of Chronic Issues    Patient presents today for a 5 month follow-up   Quality Metric Gaps    TDAP     HPI: Patient is a 82 y.o. male for routine follow up  About a week ago his feet was red, swelling and warm now this has resolved but toes purplish. Prior to the redness and swelling reports he was doing some exercise during therapy and hear something in his foot and it was hurting afterward  Swelling and pain has improved.   Has not made dietary modifications, continues to eat without modifications.   Blood pressure well controlled.   Has issues with dry mouth and constipation On oxybutynin for overactive bladder still has trouble with this.     Review of Systems:  Review of Systems  Constitutional:  Negative for chills, fever and weight loss.  HENT:  Negative for tinnitus.   Respiratory:  Negative for cough, sputum production and shortness of breath.   Cardiovascular:  Negative for chest pain, palpitations and leg swelling.  Gastrointestinal:  Positive for constipation. Negative for abdominal pain, diarrhea and heartburn.  Genitourinary:  Positive for frequency. Negative for dysuria and urgency.  Musculoskeletal:  Positive for myalgias. Negative for back pain, falls and joint pain.  Skin: Negative.   Neurological:  Negative for dizziness and headaches.  Psychiatric/Behavioral:  Negative for depression and memory loss. The patient does not have insomnia.     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 03/06/12   Diaphragmatic hernia without mention of obstruction or gangrene 1982   Hiatal hernia   Diplopia 2006   Disturbance of skin sensation 2003   Diverticulosis of colon (without mention of hemorrhage) 1997   Dizziness and giddiness 2001   Dysphagia, unspecified(787.20) 1999 and   Esophagitis, unspecified 1997   GERD (gastroesophageal reflux disease) 2003   Hypertension 2003   Impotence of organic origin 1999   Lack of coordination September 07, 2007   Lumbago 2003   Migraine with aura, without mention of intractable migraine without mention of status migrainosus 1997   Myalgia and myositis, unspecified 1999   Other and unspecified hyperlipidemia 2003   Other disorders of vitreous 2003   Other malaise and fatigue 2003   Other seborrheic keratosis 2013   Other seborrheic keratosis 04/13/09   Pain in joint, site unspecified 04/13/12   Personal history of fall 04/15/11   Restless legs syndrome (RLS) 1997   Spinocerebellar disease, 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, 2008   Unspecified hearing loss 2003   Unspecified tinnitus 04/15/11   Past Surgical History:  Procedure Laterality Date   APPENDECTOMY  Age 19   boil removed  Dr. Dwain Sarna   C3-4 anterior cervical surgery  1999   Dr. Gerlene Fee   CARDIAC CATHETERIZATION  2001   Dr. Aleen Campi   COLONOSCOPY  07/20/07   Dr. Jarold Motto   EYE SURGERY Bilateral 2015   cataracts   LAMINOTOMY / EXCISION DISK POSTERIOR CERVICAL SPINE     Spinal Disk (?4-5)   Social History:   reports that he has never smoked. He quit smokeless tobacco use about 43 years ago.  His smokeless tobacco use included chew. He reports that he  does not currently use alcohol. He reports that he does not use drugs.  Family History  Problem Relation Age of Onset   Heart disease Father        SCA with multiple family members with SCA   Diabetes Neg Hx    Colon cancer Neg Hx    Colon polyps Neg Hx    Esophageal cancer Neg Hx    Kidney disease Neg Hx    Gallbladder disease Neg Hx     Medications: Patient's Medications  New Prescriptions   No medications on file  Previous Medications   ACETAMINOPHEN (TYLENOL 8 HOUR ARTHRITIS PAIN) 650 MG CR TABLET    Take 1 tablet (650 mg total) by mouth 2 (two) times daily.   AMLODIPINE (NORVASC) 5 MG TABLET    TAKE 1 TABLET BY MOUTH EVERY DAY   FENOFIBRATE (TRICOR) 145 MG TABLET    Take 1 tablet (145 mg total) by mouth daily.   HYDROCHLOROTHIAZIDE (HYDRODIURIL) 12.5 MG TABLET    TAKE 1 TABLET BY MOUTH EVERY DAY   LOSARTAN (COZAAR) 100 MG TABLET    TAKE 1 TABLET (100 MG TOTAL) BY MOUTH DAILY   OXYBUTYNIN (DITROPAN) 5 MG TABLET    TAKE 1 TABLET BY MOUTH EVERY DAY   PANTOPRAZOLE (PROTONIX) 40 MG TABLET    TAKE 1 TABLET BY MOUTH EVERY DAY   POTASSIUM CHLORIDE (KLOR-CON) 10 MEQ TABLET    TAKE 1 TABLET BY MOUTH EVERY DAY   PREGABALIN (LYRICA) 25 MG CAPSULE    TAKE 1 CAPSULE BY MOUTH THREE TIMES A DAY   PROPRANOLOL (INDERAL) 40 MG TABLET    TAKE 1 TABLET BY MOUTH TWICE A DAY  Modified Medications   No medications on file  Discontinued Medications   No medications on file    Physical Exam:  Vitals:   09/25/22 1338  BP: 132/84  Pulse: (!) 56  SpO2: 95%  Weight: 182 lb 3.2 oz (82.6 kg)  Height: 5\' 7"  (1.702 m)   Body mass index is 28.54 kg/m. Wt Readings from Last 3 Encounters:  09/25/22 182 lb 3.2 oz (82.6 kg)  06/04/22 179 lb (81.2 kg)  04/17/22 179 lb (81.2 kg)    Physical Exam Constitutional:      General: He is not in acute distress.    Appearance: He is well-developed. He is not diaphoretic.  HENT:     Head: Normocephalic and atraumatic.     Right Ear: External ear normal.      Left Ear: External ear normal.     Mouth/Throat:     Pharynx: No oropharyngeal exudate.  Eyes:     Conjunctiva/sclera: Conjunctivae normal.     Pupils: Pupils are equal, round, and reactive to light.  Cardiovascular:     Rate and Rhythm: Normal rate and regular rhythm.     Heart sounds: Normal heart sounds.  Pulmonary:     Effort: Pulmonary effort is normal.     Breath sounds: Normal  breath sounds.  Abdominal:     General: Bowel sounds are normal.     Palpations: Abdomen is soft.  Musculoskeletal:        General: No tenderness.     Cervical back: Normal range of motion and neck supple.     Right lower leg: No edema.     Left lower leg: No edema.  Skin:    General: Skin is warm and dry.  Neurological:     Mental Status: He is alert and oriented to person, place, and time.     Labs reviewed: Basic Metabolic Panel: Recent Labs    12/15/21 1411 05/04/22 0836  NA 141 140  K 4.4 4.3  CL 105 102  CO2 25 26  GLUCOSE 92 113*  BUN 16 18  CREATININE 1.08 1.06  CALCIUM 10.2 9.7   Liver Function Tests: Recent Labs    12/15/21 1411 05/04/22 0836  AST 21 17  ALT 33 25  BILITOT 0.4 0.6  PROT 6.8 6.6   No results for input(s): "LIPASE", "AMYLASE" in the last 8760 hours. No results for input(s): "AMMONIA" in the last 8760 hours. CBC: Recent Labs    12/15/21 1411 05/04/22 0836  WBC 8.8 8.3  NEUTROABS 4,673 4,399  HGB 14.8 14.9  HCT 44.5 43.2  MCV 90.8 90.4  PLT 229 232   Lipid Panel: Recent Labs    12/15/21 1411 05/04/22 0836 07/08/22 0853  CHOL 171 201* 174  HDL 39* 42 44  LDLCALC  --  107* 90  TRIG 415* 392* 278*  CHOLHDL 4.4 4.8 4.0   TSH: No results for input(s): "TSH" in the last 8760 hours. A1C: Lab Results  Component Value Date   HGBA1C 6.4 (H) 05/04/2022     Assessment/Plan 1. Diminished pulses in lower extremity -also with discoloration to bilateral LE, - VAS [place] ABI WITH/WO TBI; Future  2. Spinocerebellar ataxia type 6  (HCC) -stable, continues with support from family and caregiver. Uses assistive device for ambulation  3. Mixed hyperlipidemia -strongly encouraged dietary modifications with tricor - Complete Metabolic Panel with eGFR  4. Hyperglycemia -dietary modifications and increase physical activity recommended.  - Hemoglobin A1c  5. Essential hypertension -Blood pressure well controlled, goal bp <140/90 Continue current medications and dietary modifications follow metabolic panel -consider stopping HCTZ due to OAB - CBC with Differential/Platelet - Complete Metabolic Panel with eGFR  6. Skin lesion - Ambulatory referral to Dermatology  7. Overactive bladder -will stop oxybutynin due to side effects, consider alternate medication if needed.   Return in about 4 months (around 01/25/2023) for routine follow up .  Janene Harvey. Biagio Borg [facility] [phone]

## 2022-09-26 LAB — HEMOGLOBIN A1C
Hgb A1c MFr Bld: 6.7 % of total Hgb — ABNORMAL HIGH (ref <5.7)
Mean Plasma Glucose: 146 mg/dL
eAG (mmol/L): 8.1 mmol/L

## 2022-09-26 LAB — COMPLETE METABOLIC PANEL WITH GFR
AG Ratio: 2 (calc) (ref 1.0–2.5)
ALT: 44 U/L (ref 9–46)
AST: 26 U/L (ref 10–35)
Albumin: 4.3 g/dL (ref 3.6–5.1)
Alkaline phosphatase (APISO): 96 U/L (ref 35–144)
BUN/Creatinine Ratio: 13 (calc) (ref 6–22)
BUN: 18 mg/dL (ref 7–25)
CO2: 26 mmol/L (ref 20–32)
Calcium: 10.5 mg/dL — ABNORMAL HIGH (ref 8.6–10.3)
Chloride: 101 mmol/L (ref 98–110)
Creat: 1.37 mg/dL — ABNORMAL HIGH (ref 0.70–1.22)
Globulin: 2.2 g/dL (calc) (ref 1.9–3.7)
Glucose, Bld: 106 mg/dL — ABNORMAL HIGH (ref 65–99)
Potassium: 4.4 mmol/L (ref 3.5–5.3)
Sodium: 137 mmol/L (ref 135–146)
Total Bilirubin: 0.5 mg/dL (ref 0.2–1.2)
Total Protein: 6.5 g/dL (ref 6.1–8.1)
eGFR: 52 mL/min/{1.73_m2} — ABNORMAL LOW (ref >=60)

## 2022-09-26 LAB — CBC WITH DIFFERENTIAL/PLATELET
Basophils Relative: 0.7 %
Lymphs Abs: 3625 cells/uL (ref 850–3900)
MCH: 30.5 pg (ref 27.0–33.0)
MCHC: 34.5 g/dL (ref 32.0–36.0)
MCV: 88.3 fL (ref 80.0–100.0)
MPV: 11.1 fL (ref 7.5–12.5)
Monocytes Relative: 9.2 %
Neutrophils Relative %: 48.5 %
Platelets: 299 10*3/uL (ref 140–400)
RDW: 12.9 % (ref 11.0–15.0)
WBC: 9.2 10*3/uL (ref 3.8–10.8)

## 2022-09-30 ENCOUNTER — Ambulatory Visit: Payer: Medicare Other | Attending: Nurse Practitioner

## 2022-09-30 DIAGNOSIS — R260 Ataxic gait: Secondary | ICD-10-CM | POA: Diagnosis not present

## 2022-09-30 DIAGNOSIS — R2681 Unsteadiness on feet: Secondary | ICD-10-CM | POA: Diagnosis not present

## 2022-09-30 DIAGNOSIS — R2689 Other abnormalities of gait and mobility: Secondary | ICD-10-CM | POA: Diagnosis not present

## 2022-09-30 DIAGNOSIS — R262 Difficulty in walking, not elsewhere classified: Secondary | ICD-10-CM | POA: Diagnosis not present

## 2022-09-30 DIAGNOSIS — M79671 Pain in right foot: Secondary | ICD-10-CM | POA: Insufficient documentation

## 2022-09-30 NOTE — Therapy (Signed)
OUTPATIENT PHYSICAL THERAPY NEURO TREATMENT   Patient Name: Justin [name] MRN: [id number] DOB:[birth year], 82 y.o., male Today's Date: 09/30/2022 Dates of service 04/29/22-09/23/22 PCP: Abbey Chatters  REFERRING PROVIDER: Sharon Seller, NP    END OF SESSION:  PT End of Session - 09/30/22 1448     Visit Number 41    Number of Visits 45    Date for PT Re-Evaluation 10/28/22    Authorization Type [employer] MCR    Authorization - Number of Visits 40    Progress Note Due on Visit 50    PT Start Time 1448    PT Stop Time 1530    PT Time Calculation (min) 42 min    Activity Tolerance Patient tolerated treatment well    Behavior During Therapy [facility] for tasks assessed/performed                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   Diaphragmatic hernia without mention of obstruction or gangrene 1982   Hiatal hernia   Diplopia 2006   Disturbance of skin sensation 2003   Diverticulosis of colon (without mention of hemorrhage) 1997   Dizziness and giddiness 2001   Dysphagia, unspecified(787.20) 1999 and   Esophagitis, unspecified 1997   GERD (gastroesophageal reflux disease) 2003   Hypertension 2003   Impotence of organic origin 1999   Lack of coordination September 07, 2007   Lumbago 2003   Migraine with aura, without mention of intractable migraine without mention of status migrainosus 1997   Myalgia and myositis, unspecified 1999   Other and unspecified hyperlipidemia 2003   Other disorders of vitreous 2003   Other malaise and fatigue 2003   Other seborrheic keratosis 2013   Other seborrheic keratosis 04/13/09   Pain in joint, site unspecified 04/13/12   Personal  history of fall 04/15/11   Restless legs syndrome (RLS) 1997   Spinocerebellar disease, 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, 2008   Unspecified hearing loss 2003   Unspecified tinnitus 04/15/11   Past Surgical History:  Procedure Laterality Date   APPENDECTOMY  Age 20   boil removed     Dr. Dwain Sarna   C3-4 anterior cervical surgery  1999   Dr. Gerlene Fee   CARDIAC CATHETERIZATION  2001   Dr. Aleen Campi   COLONOSCOPY  07/20/07   Dr. 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   Internal hemorrhoids without complication 08/06/2014   Arthritis 02/21/2014   Dysarthria 02/21/2014   Seborrheic keratosis 02/21/2014   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   Tinnitus 04/12/2013   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HCC) 10/16/2012   Impotence, 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   Essential hypertension    GERD (gastroesophageal reflux disease)  Dyslipidemia    Lumbago    Dysphagia     ONSET DATE: 04/17/2022  REFERRING DIAG: G11.9 (ICD-10-CM) - 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HCC) R26.89 (ICD-10-CM) - Imbalance  THERAPY DIAG:  Difficulty in walking, not elsewhere classified  Other abnormalities of gait and mobility  Ataxic gait  Unsteadiness on feet  Rationale for Evaluation and Treatment: Rehabilitation  SUBJECTIVE:                                                                                                                                                                                             SUBJECTIVE STATEMENT:  Patient  reports he was doing standing heel raises and heard a pop with in the toes on his right foot; states his R foot foot has been swelling lately and has been wearing compression sock which is helping.  Pt accompanied by:  caregiver  [name]  PERTINENT HISTORY:  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HCC) -progressive, continues with supportive care - Ambulatory referral to Physical Therapy      PAIN:  Are you having pain? No 0/10  PRECAUTIONS: Fall  WEIGHT BEARING RESTRICTIONS: No  FALLS: Has patient fallen in last 6 months? No; FOF (+), not hesitant to leave the house    OBJECTIVE:  Functional test: 5x STS 05/26/22: 1 min 14 sec 5x STS 06/11/22: 32 sec no UE support 5x STS 07/06/22: 42 sec 5xSTS 09/16/22: 50 sec  TUG (1/24): 1:27   TODAY'S TREATMENT:    [facility] Adult PT Treatment:                                                DATE: 09/30/2022 Therapeutic Activity: Standing at counter:  Overhead reaching with looking Overhead reaches across midline Mat Table: S/L leg kicks front/back (focus on back) S/L clamshells GTB --> bent knee raises GTB Supine figure 4 dynamic hip stretch Supine therapist resisted hip extension + leg extension stretch at end range Wide windshield wiper legs  STS with black super band around posterior pelvis (eccentric/concentric) Standing paloff press GTB Standing alt overhead arm raises (no HHA) Standing side bend stretch    [facility] Adult PT Treatment:                                                DATE: 09/23/22 Therapeutic Exercise: Nustep L6 x 5 min for UE/LE coordination, AAROM and warm up  Neuromuscular re-ed: Gait with NBQC forward/backward at counter with CGA Sit to stand with red power band around waist focus on trunk and hip extension Side step at counter with tapping numbers on cabinets for trunk ext with functional activity, then sidestep over noodles with tapping numbers on cabinets PWR twist with ball pass between hands Seated march with 1kg ball  overhead 2 x 10 Seated trunk diagonals 1kg ball x 10 bilat Seated deadlift 10# KB x 10   PATIENT EDUCATION: Education details: Updated HEP Person educated: Patient and Caregiver   Education method: Explanation Education comprehension: verbalized understanding  HOME EXERCISE PROGRAM: Access Code: [email or web address] URL: [email or web address] Date: 08/24/2022 Prepared by: Carlynn Herald  Exercises - Sidelying Open Book  - 1 x daily - 7 x weekly - 3 sets - 10 reps - Clam with Resistance  - 1 x daily - 7 x weekly - 3 sets - 10 reps - Sidelying Hip Abduction and Extension with Loop Band  - 1 x daily - 7 x weekly - 3 sets - 10 reps - Lower Trunk Rotations  - 1 x daily - 7 x weekly - 3 sets - 10 reps - Supine Bridge with Mini Swiss Ball Between Knees  - 1 x daily - 7 x weekly - 3 sets - 10 reps - Supine Hamstring Stretch with Strap  - 1 x daily - 7 x weekly - 3 sets - 10 reps - Seated Reach Forward, Up, and To Sides  - 1 x daily - 7 x weekly - 3 sets - 10 reps - Seated Reaching to Side and Across Body  - 2 x daily - 7 x weekly - 2 sets - 10 reps - Sit to Stand with Counter Support  - 1 x daily - 7 x weekly - 3 sets - 10 reps - Seated Shoulder Row with Anchored Resistance  - 1 x daily - 7 x weekly - 3 sets - 10 reps - Seated Anti-Rotation Press With Anchored Resistance  - 1 x daily - 7 x weekly - 3 sets - 10 reps - Seated Trunk Rotation with Anchored Resistance  - 1 x daily - 7 x weekly - 3 sets - 10 reps - Gastroc Stretch on Wall  - 2 x daily - 7 x weekly - 1 sets - 4 reps - 30 sec hold - Standing Heel Raise with Support  - 1 x daily - 7 x weekly - 3 sets - 10 reps - Side to Side Weight Shift with Overhead Reach and Counter Support  - 2 x daily - 7 x weekly - 2 sets - 10 reps - Staggered Stance Forward Backward Weight Shift with Counter Support  - 1 x daily - 7 x weekly - 3 sets - 10 reps - Standing Sidebending with Chair Support  - 1 x daily - 7 x weekly - 3 sets - 10 reps - Mini  Squat with Chair  - 1 x daily - 7 x weekly - 3 sets - 10 reps  GOALS: Goals reviewed with patient? Yes  SHORT TERM GOALS: Target date: 05/27/2022  Will be compliant with appropriate HEP with no more than MinA from caregiver  Baseline: Goal status: MET   2.  Will be able to verbalize at least 3 ways to reduce fall risk at home and in the community  Baseline:  Goal status: MET   3.  Will be able to complete 5x sit to stand in 20 seconds or less with no  posterior LOB to show improved functional mobility  Baseline: 1 min 15 sec 05/26/22 Goal status: IN PROGRESS 12/28:IN PROGRESS 06/19/2022: PROGRESSING ( 4 sec) 07/06/22: PROGRESSING (33 sec)  4.  Will be able to ambulate at least 180ft with no rest breaks and LRAD  Baseline: 2x75' with one seated rest break using RW 05/26/22 Goal status: IN PROGRESS 12/28: PARTIALLY MET 06/19/2022: MET (160' RW)    LONG TERM GOALS: Target date: 10/28/2022  Will be able to complete TUG in 40 seconds or less with LRAD and no more than Min guard to show improved balance/mobility  Baseline:  Goal status: IN PROGRESS 07/06/22: 1 min 22 sec 08/10/22: 1 min 34 sec  2.  Pt will increase BERG balance score to >/=30/56 to demonstrate improved static balance. Baseline: 19/56 (12/4) Goal status: IN PROGRESS (26/56) on 08/10/22 (updated in flowsheet)  3.  Will be able to navigate single step with LRAD and no more than Min guard for safety  Baseline:  Goal status: MET (CGA/close SBA from caretaker)  4.  Pain in R ankle to be no more than 5/10 at worst  Baseline:  Goal status: MET no pain  5.  Will be able to ambulate at least 247ft with LRAD, no rest breaks, and Min guard to show improved functional mobility  Baseline:  Goal status: MET  6.  Pt will improve gait speed to >= 0.65 ft/sec to demo decreased fall risk Baseline:  Goal status: INITIAL    ASSESSMENT:  CLINICAL IMPRESSION:  Concentric control challenged during sit to stand with  resistance at hips. Improved postural stability noted during unsupported standing exercises (close SBA). Increased forward flexed hip posture and forward postural lean exhibited with increased fatigue in standing.    OBJECTIVE IMPAIRMENTS: Abnormal gait, decreased activity tolerance, decreased balance, decreased coordination, decreased knowledge of use of DME, decreased mobility, difficulty walking, decreased strength, and pain.    PLAN:  PT FREQUENCY: 2x/week  PT DURATION: 6 weeks  PLANNED INTERVENTIONS: Therapeutic exercises, Therapeutic activity, Neuromuscular re-education, Balance training, Gait training, Patient/Family education, Self Care, Joint mobilization, Stair training, DME instructions, Aquatic Therapy, Wheelchair mobility training, Biofeedback, Ionotophoresis 4mg /ml Dexamethasone, Manual therapy, and Re-evaluation  PLAN FOR NEXT SESSION: Progress and challenge dynamic balance and postural stability  Carlynn Herald, PTA 09/30/2022 3:34 PM

## 2022-10-02 ENCOUNTER — Ambulatory Visit (HOSPITAL_COMMUNITY)
Admission: RE | Admit: 2022-10-02 | Discharge: 2022-10-02 | Disposition: A | Discharge status: Home / Self Care | Payer: Medicare Other | Source: Ambulatory Visit | Attending: Nurse Practitioner | Admitting: Nurse Practitioner

## 2022-10-02 DIAGNOSIS — R0989 Other specified symptoms and signs involving the circulatory and respiratory systems: Secondary | ICD-10-CM | POA: Insufficient documentation

## 2022-10-02 LAB — VAS US ABI WITH/WO TBI
Left ABI: 1.01
Right ABI: 0.75

## 2022-10-05 ENCOUNTER — Other Ambulatory Visit: Payer: Self-pay | Admitting: Nurse Practitioner

## 2022-10-05 DIAGNOSIS — R6889 Other general symptoms and signs: Secondary | ICD-10-CM

## 2022-10-07 ENCOUNTER — Ambulatory Visit: Payer: Medicare Other | Admitting: Physician Assistant

## 2022-10-07 ENCOUNTER — Ambulatory Visit: Payer: Medicare Other

## 2022-10-07 VITALS — BP 134/80 | HR 76 | Temp 98.0°F

## 2022-10-07 DIAGNOSIS — R260 Ataxic gait: Secondary | ICD-10-CM | POA: Diagnosis not present

## 2022-10-07 DIAGNOSIS — R262 Difficulty in walking, not elsewhere classified: Secondary | ICD-10-CM | POA: Diagnosis not present

## 2022-10-07 DIAGNOSIS — R2689 Other abnormalities of gait and mobility: Secondary | ICD-10-CM | POA: Diagnosis not present

## 2022-10-07 DIAGNOSIS — R2681 Unsteadiness on feet: Secondary | ICD-10-CM

## 2022-10-07 DIAGNOSIS — I739 Peripheral vascular disease, unspecified: Secondary | ICD-10-CM | POA: Diagnosis not present

## 2022-10-07 DIAGNOSIS — M79671 Pain in right foot: Secondary | ICD-10-CM | POA: Diagnosis not present

## 2022-10-07 NOTE — Therapy (Signed)
OUTPATIENT PHYSICAL THERAPY NEURO TREATMENT   Patient Name: Justin [name] MRN: [id number] DOB:Feb 13, 1941, 82 y.o., male Today's Date: 10/07/2022 Dates of service 04/29/22-09/23/22 PCP: Abbey Chatters  REFERRING PROVIDER: Sharon Seller, NP    END OF SESSION:  PT End of Session - 10/07/22 1402     Visit Number 42    Number of Visits 45    Date for PT Re-Evaluation 10/28/22    Authorization Type BC[employer] MCR    PT Start Time 1402    PT Stop Time 1448    PT Time Calculation (min) 46 min    Activity Tolerance Patient tolerated treatment well    Behavior During Therapy [facility] for tasks assessed/performed                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   Diaphragmatic hernia without mention of obstruction or gangrene 1982   Hiatal hernia   Diplopia 2006   Disturbance of skin sensation 2003   Diverticulosis of colon (without mention of hemorrhage) 1997   Dizziness and giddiness 2001   Dysphagia, unspecified(787.20) 1999 and   Esophagitis, unspecified 1997   GERD (gastroesophageal reflux disease) 2003   Hypertension 2003   Impotence of organic origin 1999   Lack of coordination September 07, 2007   Lumbago 2003   Migraine with aura, without mention of intractable migraine without mention of status migrainosus 1997   Myalgia and myositis, unspecified 1999   Other and unspecified hyperlipidemia 2003   Other disorders of vitreous 2003   Other malaise and fatigue 2003   Other seborrheic keratosis 2013   Other seborrheic keratosis 04/13/09   Pain in joint, site unspecified 04/13/12   Personal history of fall 04/15/11   Restless legs syndrome (RLS) 1997    Spinocerebellar disease, 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 Dwain Sarna   C3-4 anterior cervical surgery  1999   Dr. Gerlene Fee   CARDIAC CATHETERIZATION  2001   Dr. Aleen Campi   COLONOSCOPY  07/20/07   Dr. 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   Internal hemorrhoids without complication 08/06/2014   Arthritis 02/21/2014   Dysarthria 02/21/2014   Seborrheic keratosis 02/21/2014   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   Tinnitus 04/12/2013   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HCC) 10/16/2012   Impotence, 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   Essential hypertension    GERD (gastroesophageal reflux disease)    Dyslipidemia    Lumbago    Dysphagia     ONSET DATE: 04/17/2022  REFERRING DIAG: G11.9 (ICD-10-CM) - 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HCC) R26.89 (ICD-10-CM) - Imbalance  THERAPY DIAG:  Difficulty in walking, not elsewhere classified  Other abnormalities of gait and mobility  Ataxic gait  Unsteadiness on feet  Rationale for Evaluation and Treatment: Rehabilitation  SUBJECTIVE:                                                                                                                                                                                             SUBJECTIVE STATEMENT:  Patient reports he got a good report from ultrasound on the vein in his foot  where he was having circulation issues, says he was told to start elevating his legs when sitting.  Pt accompanied by:  caregiver  [name]  PERTINENT HISTORY:  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HCC) -progressive, continues with supportive care - Ambulatory referral to Physical Therapy      PAIN:  Are you having pain? No 0/10  PRECAUTIONS: Fall  WEIGHT BEARING RESTRICTIONS: No  FALLS: Has patient fallen in last 6 months? No; FOF (+), not hesitant to leave the house    OBJECTIVE:  Functional test: 5x STS 05/26/22: 1 min 14 sec 5x STS 06/11/22: 32 sec no UE support 5x STS 07/06/22: 42 sec 5xSTS 09/16/22: 50 sec  TUG (1/24): 1:27   TODAY'S TREATMENT:    [facility] Adult PT Treatment:                                                DATE: 10/07/2022 Therapeutic Activity: Mat Table: Seated forward/back bend stretch LTR Bridges S/L hip extension kicks --> straight leg hip abd --> add leg raises Seated in chair :  (big noodle) shoulder flexion 2#dowel + gaze tracking bar  Scap squeezes with noodle Side bent stretch (hands behind head) Standing: (no HHA in front of counter, CGA) Standing balance + placing hands behind head Reaching across midline Reaching for notecard to side --> placing it across midline Small ball pick up from side --> arc reach across midline with gaze tracking    [facility] Adult PT Treatment:                                                DATE: 09/30/2022 Therapeutic Activity: Standing at counter:  Overhead reaching with looking Overhead reaches across midline Mat Table: S/L leg kicks front/back (focus on back) S/L clamshells  GTB --> bent knee raises GTB Supine figure 4 dynamic hip stretch Supine therapist resisted hip extension + leg extension stretch at end range Wide windshield wiper legs  STS with black super band around posterior pelvis (eccentric/concentric) Standing paloff press GTB Standing alt overhead arm raises (no HHA) Standing side bend stretch   PATIENT  EDUCATION: Education details: Updated HEP Person educated: Patient and Caregiver   Education method: Explanation Education comprehension: verbalized understanding  HOME EXERCISE PROGRAM: Access Code: [email or web address] URL: [email or web address] Date: 08/24/2022 Prepared by: Carlynn Herald  Exercises - Sidelying Open Book  - 1 x daily - 7 x weekly - 3 sets - 10 reps - Clam with Resistance  - 1 x daily - 7 x weekly - 3 sets - 10 reps - Sidelying Hip Abduction and Extension with Loop Band  - 1 x daily - 7 x weekly - 3 sets - 10 reps - Lower Trunk Rotations  - 1 x daily - 7 x weekly - 3 sets - 10 reps - Supine Bridge with Mini Swiss Ball Between Knees  - 1 x daily - 7 x weekly - 3 sets - 10 reps - Supine Hamstring Stretch with Strap  - 1 x daily - 7 x weekly - 3 sets - 10 reps - Seated Reach Forward, Up, and To Sides  - 1 x daily - 7 x weekly - 3 sets - 10 reps - Seated Reaching to Side and Across Body  - 2 x daily - 7 x weekly - 2 sets - 10 reps - Sit to Stand with Counter Support  - 1 x daily - 7 x weekly - 3 sets - 10 reps - Seated Shoulder Row with Anchored Resistance  - 1 x daily - 7 x weekly - 3 sets - 10 reps - Seated Anti-Rotation Press With Anchored Resistance  - 1 x daily - 7 x weekly - 3 sets - 10 reps - Seated Trunk Rotation with Anchored Resistance  - 1 x daily - 7 x weekly - 3 sets - 10 reps - Gastroc Stretch on Wall  - 2 x daily - 7 x weekly - 1 sets - 4 reps - 30 sec hold - Standing Heel Raise with Support  - 1 x daily - 7 x weekly - 3 sets - 10 reps - Side to Side Weight Shift with Overhead Reach and Counter Support  - 2 x daily - 7 x weekly - 2 sets - 10 reps - Staggered Stance Forward Backward Weight Shift with Counter Support  - 1 x daily - 7 x weekly - 3 sets - 10 reps - Standing Sidebending with Chair Support  - 1 x daily - 7 x weekly - 3 sets - 10 reps - Mini Squat with Chair  - 1 x daily - 7 x weekly - 3 sets - 10 reps  GOALS: Goals reviewed with patient?  Yes  SHORT TERM GOALS: Target date: 05/27/2022  Will be compliant with appropriate HEP with no more than MinA from caregiver  Baseline: Goal status: MET   2.  Will be able to verbalize at least 3 ways to reduce fall risk at home and in the community  Baseline:  Goal status: MET   3.  Will be able to complete 5x sit to stand in 20 seconds or less with no posterior LOB to show improved functional mobility  Baseline: 1 min 15 sec 05/26/22 Goal status: IN PROGRESS 12/28:IN PROGRESS 06/19/2022: PROGRESSING ( 4 sec) 07/06/22:  PROGRESSING (33 sec)  4.  Will be able to ambulate at least 117ft with no rest breaks and LRAD  Baseline: 2x75' with one seated rest break using RW 05/26/22 Goal status: IN PROGRESS 12/28: PARTIALLY MET 06/19/2022: MET (160' RW)    LONG TERM GOALS: Target date: 10/28/2022  Will be able to complete TUG in 40 seconds or less with LRAD and no more than Min guard to show improved balance/mobility  Baseline:  Goal status: IN PROGRESS 07/06/22: 1 min 22 sec 08/10/22: 1 min 34 sec  2.  Pt will increase BERG balance score to >/=30/56 to demonstrate improved static balance. Baseline: 19/56 (12/4) Goal status: IN PROGRESS (26/56) on 08/10/22 (updated in flowsheet)  3.  Will be able to navigate single step with LRAD and no more than Min guard for safety  Baseline:  Goal status: MET (CGA/close SBA from caretaker)  4.  Pain in R ankle to be no more than 5/10 at worst  Baseline:  Goal status: MET no pain  5.  Will be able to ambulate at least 210ft with LRAD, no rest breaks, and Min guard to show improved functional mobility  Baseline:  Goal status: MET  6.  Pt will improve gait speed to >= 0.65 ft/sec to demo decreased fall risk Baseline:  Goal status: INITIAL    ASSESSMENT:  CLINICAL IMPRESSION:  Stability and dynamic balance continued to be challenged with reaching across midline activities; cueing for target tracking further challenged postural stability  and balance. Occasional mild LOB during standing balance activities; patient regained balance with hip strategy and HHA from counter (CGA by therapist).   OBJECTIVE IMPAIRMENTS: Abnormal gait, decreased activity tolerance, decreased balance, decreased coordination, decreased knowledge of use of DME, decreased mobility, difficulty walking, decreased strength, and pain.    PLAN:  PT FREQUENCY: 2x/week  PT DURATION: 6 weeks  PLANNED INTERVENTIONS: Therapeutic exercises, Therapeutic activity, Neuromuscular re-education, Balance training, Gait training, Patient/Family education, Self Care, Joint mobilization, Stair training, DME instructions, Aquatic Therapy, Wheelchair mobility training, Biofeedback, Ionotophoresis 4mg /ml Dexamethasone, Manual therapy, and Re-evaluation  PLAN FOR NEXT SESSION: Progress and challenge dynamic balance and postural stability  Carlynn Herald, PTA 10/07/2022 2:48 PM

## 2022-10-07 NOTE — Progress Notes (Signed)
Office Note     CC:  follow up Requesting Provider:  Sharon Seller, NP  HPI: Justin [name] is a 82 y.o. ([birth year]) male who presents for vascular evaluation at request of his PCP. He presents toady with his Caregiver [name]. He has been followed by Dr. 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 He was most recently seen in January of this year. At that time Dr. Edilia Bo had noted some monophasic DP signals on the right with biphasic signals on the left leg. He recommended annual follow up with non invasive studies. He says he recently started having swelling in his legs/feet, left > right and some coolness in his toes. He had seen his PCP recently for his regular check up and she recommended vascular evaluation. His ambulation is limited secondary to his Spinocerebellar disease, but he does walk some with a walker. He is currently in PT. He denies any pain in his legs on ambulation. No tissue loss. He said he was having pain in his right foot that was waking him up from sleep but he recently got an injection in his heel for plantar fascitis and he has had a complete resolution of his right foot pain. He does elevate his legs in recliner frequently. He also wears knee high compression stockings, which he reports do help with the swelling in his legs. He takes Aspirin daily. He is a non smoker.    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   Diaphragmatic hernia without mention of obstruction or gangrene 1982   Hiatal hernia   Diplopia 2006   Disturbance of skin sensation 2003   Diverticulosis of colon (without mention of hemorrhage) 1997   Dizziness and giddiness 2001   Dysphagia,  unspecified(787.20) 1999 and   Esophagitis, unspecified 1997   GERD (gastroesophageal reflux disease) 2003   Hypertension 2003   Impotence of organic origin 1999   Lack of coordination September 07, 2007   Lumbago 2003   Migraine with aura, without mention of intractable migraine without mention of status migrainosus 1997   Myalgia and myositis, unspecified 1999   Other and unspecified hyperlipidemia 2003   Other disorders of vitreous 2003   Other malaise and fatigue 2003   Other seborrheic keratosis 2013   Other seborrheic keratosis 04/13/09   Pain in joint, site unspecified 04/13/12   Personal history of fall 04/15/11   Restless legs syndrome (RLS) 1997   Spinocerebellar disease, 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 Dwain Sarna   C3-4 anterior cervical surgery  1999   Dr. Gerlene Fee   CARDIAC CATHETERIZATION  2001   Dr. Aleen Campi   COLONOSCOPY  07/20/07   Dr. Jarold Motto   EYE SURGERY Bilateral 2015   cataracts   LAMINOTOMY / EXCISION DISK POSTERIOR CERVICAL SPINE     Spinal Disk (?4-5)    Social History   Socioeconomic History   Marital status: Widowed    Spouse name: Not on file   Number of  children: 2   Years of education: Not on file   Highest education level: Not on file  Occupational History   Occupation: Real Estate  Tobacco Use   Smoking status: Never   Smokeless tobacco: Former    Types: Chew    Quit date: 06/02/1979   Tobacco comments:    Stopped Chew about 1981  Vaping Use   Vaping Use: Never used  Substance and Sexual Activity   Alcohol use: Not Currently   Drug use: No   Sexual activity: Yes  Other Topics Concern   Not on file  Social History Narrative   Right handed   Drinks caffeine   One story home   Social Determinants of Health   Financial Resource Strain: Low Risk  (04/04/2018)   Overall  Financial Resource Strain (CARDIA)    Difficulty of Paying Living Expenses: Not hard at all  Food Insecurity: No Food Insecurity (04/04/2018)   Hunger Vital Sign    Worried About Running Out of Food in the Last Year: Never true    Ran Out of Food in the Last Year: Never true  Transportation Needs: No Transportation Needs (04/04/2018)   PRAPARE - Administrator, Civil Service (Medical): No    Lack of Transportation (Non-Medical): No  Physical Activity: Insufficiently Active (04/04/2018)   Exercise Vital Sign    Days of Exercise per Week: 3 days    Minutes of Exercise per Session: 30 min  Stress: No Stress Concern Present (04/04/2018)   [employer] of Occupational Health - Occupational Stress Questionnaire    Feeling of Stress : Not at all  Social Connections: Somewhat Isolated (04/04/2018)   Social Connection and Isolation Panel [NHANES]    Frequency of Communication with Friends and Family: More than three times a week    Frequency of Social Gatherings with Friends and Family: More than three times a week    Attends Religious Services: Never    Database administrator or Organizations: No    Attends Banker Meetings: Never    Marital Status: Married  Catering manager Violence: Not At Risk (04/04/2018)   Humiliation, Afraid, Rape, and Kick questionnaire    Fear of Current or Ex-Partner: No    Emotionally Abused: No    Physically Abused: No    Sexually Abused: No    Family History  Problem Relation Age of Onset   Heart disease Father        SCA with multiple family members with SCA   Diabetes Neg Hx    Colon cancer Neg Hx    Colon polyps Neg Hx    Esophageal cancer Neg Hx    Kidney disease Neg Hx    Gallbladder disease Neg Hx     Current Outpatient Medications  Medication Sig Dispense Refill   acetaminophen (TYLENOL 8 HOUR ARTHRITIS PAIN) 650 MG CR tablet Take 1 tablet (650 mg total) by mouth 2 (two) times daily. 60 tablet 3   amLODipine (NORVASC)  5 MG tablet TAKE 1 TABLET BY MOUTH EVERY DAY 90 tablet 3   fenofibrate (TRICOR) 145 MG tablet Take 1 tablet (145 mg total) by mouth daily. 90 tablet 1   hydrochlorothiazide (HYDRODIURIL) 12.5 MG tablet TAKE 1 TABLET BY MOUTH EVERY DAY 90 tablet 1   losartan (COZAAR) 100 MG tablet TAKE 1 TABLET (100 MG TOTAL) BY MOUTH DAILY 180 tablet 2   pantoprazole (PROTONIX) 40 MG tablet TAKE 1 TABLET BY MOUTH EVERY DAY 90 tablet 3  potassium chloride (KLOR-CON) 10 MEQ tablet TAKE 1 TABLET BY MOUTH EVERY DAY 90 tablet 3   pregabalin (LYRICA) 25 MG capsule TAKE 1 CAPSULE BY MOUTH THREE TIMES A DAY 270 capsule 1   propranolol (INDERAL) 40 MG tablet TAKE 1 TABLET BY MOUTH TWICE A DAY 180 tablet 1   No current facility-administered medications for this visit.    Allergies  Allergen Reactions   Effexor [Venlafaxine Hcl]    Serzone [Nefazodone]    Vivactil [Protriptyline Hcl]      REVIEW OF SYSTEMS:   [X]  denotes positive finding, [ ]  denotes negative finding Cardiac  Comments:  Chest pain or chest pressure:    Shortness of breath upon exertion:    Short of breath when lying flat:    Irregular heart rhythm:        Vascular    Pain in calf, thigh, or hip brought on by ambulation:    Pain in feet at night that wakes you up from your sleep:     Blood clot in your veins:    Leg swelling:  X       Pulmonary    Oxygen at home:    Productive cough:     Wheezing:         Neurologic    Sudden weakness in arms or legs:     Sudden numbness in arms or legs:     Sudden onset of difficulty speaking or slurred speech:    Temporary loss of vision in one eye:     Problems with dizziness:         Gastrointestinal    Blood in stool:     Vomited blood:         Genitourinary    Burning when urinating:     Blood in urine:        Psychiatric    Major depression:         Hematologic    Bleeding problems:    Problems with blood clotting too easily:        Skin    Rashes or ulcers:         Constitutional    Fever or chills:      PHYSICAL EXAMINATION:  Vitals:   10/07/22 0915  BP: 134/80  Pulse: 76  Temp: 98 F (36.7 C)  TempSrc: Temporal  SpO2: 97%    General:  WDWN in NAD; vital signs documented above Gait: Not observed, in wheel chair HENT: WNL, normocephalic Pulmonary: normal non-labored breathing , without wheezing Cardiac: regular HR Abdomen: soft, NT, no masses Vascular Exam/Pulses: 2+ femoral, no palpable pulses on right foot, left DP palpable. Feet warm and well perfused, toes cool. Extremities: without ischemic changes, without Gangrene , without cellulitis; without open wounds;  some edema of bilateral lower legs.  Musculoskeletal: no muscle wasting or atrophy  Neurologic: A&O X 3 Psychiatric:  The pt has Normal affect.   Non-Invasive Vascular Imaging:    +-------+-----------+-----------+------------+------------+  ABI/TBIToday's ABIToday's TBIPrevious ABIPrevious TBI  +-------+-----------+-----------+------------+------------+  Right 0.75       0.44                                 +-------+-----------+-----------+------------+------------+  Left  1.01       0.58                                 +-------+-----------+-----------+------------+------------+  ASSESSMENT/PLAN:: 82 y.o. male here for evaluation of PAD. He really does not have any appreciable claudication, rest pain or tissue loss. He does not mobilize a lot to really elicit claudication. He does have some edema in his legs. He likely has mild venous insufficiency as well.  I have recommended continued elevation and continued compression with this. Based on his non invasive studies he has moderate arterial disease on the RLE, mild on the left. I would not recommend any intervention at this time. He should continue Aspirin. Should  he develop any worsening symptoms he can follow up sooner. I will have him return in 1 year with repeat ABI   Graceann Congress, PA-C [facility] [phone]  Clinic MD:   [place]/ Randie Heinz

## 2022-10-14 ENCOUNTER — Ambulatory Visit: Payer: Medicare Other

## 2022-10-14 DIAGNOSIS — R262 Difficulty in walking, not elsewhere classified: Secondary | ICD-10-CM | POA: Diagnosis not present

## 2022-10-14 DIAGNOSIS — R2689 Other abnormalities of gait and mobility: Secondary | ICD-10-CM | POA: Diagnosis not present

## 2022-10-14 DIAGNOSIS — R2681 Unsteadiness on feet: Secondary | ICD-10-CM

## 2022-10-14 DIAGNOSIS — R260 Ataxic gait: Secondary | ICD-10-CM

## 2022-10-14 DIAGNOSIS — M79671 Pain in right foot: Secondary | ICD-10-CM | POA: Diagnosis not present

## 2022-10-14 NOTE — Therapy (Signed)
OUTPATIENT PHYSICAL THERAPY NEURO TREATMENT   Patient Name: Justin [name] MRN: [id number] DOB:06/11/1940, 82 y.o., male Today's Date: 10/14/2022 Dates of service 04/29/22-09/23/22 PCP: Abbey Chatters  REFERRING PROVIDER: Sharon Seller, NP    END OF SESSION:  PT End of Session - 10/14/22 1358     Visit Number [id number]    Number of Visits 45    Date for PT Re-Evaluation 10/28/22    Authorization Type [employer] MCR    PT Start Time 1400    PT Stop Time 1445    PT Time Calculation (min) 45 min    Activity Tolerance Patient tolerated treatment well    Behavior During Therapy 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   Diaphragmatic hernia without mention of obstruction or gangrene 1982   Hiatal hernia   Diplopia 2006   Disturbance of skin sensation 2003   Diverticulosis of colon (without mention of hemorrhage) 1997   Dizziness and giddiness 2001   Dysphagia, unspecified(787.20) 1999 and   Esophagitis, unspecified 1997   GERD (gastroesophageal reflux disease) 2003   Hypertension 2003   Impotence of organic origin 1999   Lack of coordination September 07, 2007   Lumbago 2003   Migraine with aura, without mention of intractable migraine without mention of status migrainosus 1997   Myalgia and myositis, unspecified 1999   Other and unspecified hyperlipidemia 2003   Other disorders of vitreous 2003   Other malaise and fatigue 2003   Other seborrheic keratosis 2013   Other seborrheic keratosis 04/13/09   Pain in joint, site unspecified 04/13/12   Personal history of fall 04/15/11   Restless legs syndrome (RLS) 1997    Spinocerebellar disease, 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 Dwain Sarna   C3-4 anterior cervical surgery  1999   Dr. Gerlene Fee   CARDIAC CATHETERIZATION  2001   Dr. Aleen Campi   COLONOSCOPY  07/20/07   Dr. 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   Internal hemorrhoids without complication 08/06/2014   Arthritis 02/21/2014   Dysarthria 02/21/2014   Seborrheic keratosis 02/21/2014   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   Tinnitus 04/12/2013   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HCC) 10/16/2012   Impotence, 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   Essential hypertension    GERD (gastroesophageal reflux disease)    Dyslipidemia    Lumbago    Dysphagia     ONSET DATE: 04/17/2022  REFERRING DIAG: G11.9 (ICD-10-CM) - 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HCC) R26.89 (ICD-10-CM) - Imbalance  THERAPY DIAG:  Difficulty in walking, not elsewhere classified  Other abnormalities of gait and mobility  Ataxic gait  Unsteadiness on feet  Rationale for Evaluation and Treatment: Rehabilitation  SUBJECTIVE:                                                                                                                                                                                             SUBJECTIVE STATEMENT:  Patient states he wants help with ordering a wheelchair for home use. Patient  states he is having a "stiff" day and finds it hard to stand up straight today.   Pt accompanied by:  caregiver  [name]  PERTINENT HISTORY:  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HCC) -progressive, continues with supportive care - Ambulatory referral to Physical Therapy      PAIN:  Are you having pain? No 0/10  PRECAUTIONS: Fall  WEIGHT BEARING RESTRICTIONS: No  FALLS: Has patient fallen in last 6 months? No; FOF (+), not hesitant to leave the house    OBJECTIVE:  Functional test: 5x STS 05/26/22: 1 min 14 sec 5x STS 06/11/22: 32 sec no UE support 5x STS 07/06/22: 42 sec 5xSTS 09/16/22: 50 sec  TUG (1/24): 1:27   TODAY'S TREATMENT:    [facility] Adult PT Treatment:                                                DATE: 10/14/2022 Therapeutic Activity: Discussion on where/what to order for home wheelchair Standing at counter:  Bilateral overhead arm reach + extension stretch Unsupported alternating overhead reach (CGA) Seated with noodle: Scap squeezes Shoulder flexion 2#dowel with eye tracking: seated --> standing CGA   [facility] Adult PT Treatment:                                                DATE: 10/07/2022 Therapeutic Activity: Mat Table: Seated forward/back bend stretch LTR Bridges S/L hip extension kicks --> straight leg hip abd --> add leg raises Seated in chair :  (big noodle) shoulder flexion 2#dowel + gaze tracking bar  Scap squeezes with noodle Side bent stretch (hands behind head) Standing: (no HHA in front of counter, CGA) Standing balance + placing hands behind head Reaching across midline Reaching for notecard to side --> placing it across midline Small  ball pick up from side --> arc reach across midline with gaze tracking    [facility] Adult PT Treatment:                                                DATE: 09/30/2022 Therapeutic Activity: Standing at counter:  Overhead reaching with looking Overhead reaches across midline Mat Table: S/L leg kicks front/back (focus on  back) S/L clamshells GTB --> bent knee raises GTB Supine figure 4 dynamic hip stretch Supine therapist resisted hip extension + leg extension stretch at end range Wide windshield wiper legs  STS with black super band around posterior pelvis (eccentric/concentric) Standing paloff press GTB Standing alt overhead arm raises (no HHA) Standing side bend stretch   PATIENT EDUCATION: Education details: Updated HEP Person educated: Patient and Caregiver   Education method: Explanation Education comprehension: verbalized understanding  HOME EXERCISE PROGRAM: Access Code: [email or web address] URL: [email or web address] Date: 08/24/2022 Prepared by: Carlynn Herald  Exercises - Sidelying Open Book  - 1 x daily - 7 x weekly - 3 sets - 10 reps - Clam with Resistance  - 1 x daily - 7 x weekly - 3 sets - 10 reps - Sidelying Hip Abduction and Extension with Loop Band  - 1 x daily - 7 x weekly - 3 sets - 10 reps - Lower Trunk Rotations  - 1 x daily - 7 x weekly - 3 sets - 10 reps - Supine Bridge with Mini Swiss Ball Between Knees  - 1 x daily - 7 x weekly - 3 sets - 10 reps - Supine Hamstring Stretch with Strap  - 1 x daily - 7 x weekly - 3 sets - 10 reps - Seated Reach Forward, Up, and To Sides  - 1 x daily - 7 x weekly - 3 sets - 10 reps - Seated Reaching to Side and Across Body  - 2 x daily - 7 x weekly - 2 sets - 10 reps - Sit to Stand with Counter Support  - 1 x daily - 7 x weekly - 3 sets - 10 reps - Seated Shoulder Row with Anchored Resistance  - 1 x daily - 7 x weekly - 3 sets - 10 reps - Seated Anti-Rotation Press With Anchored Resistance  - 1 x daily - 7 x weekly - 3 sets - 10 reps - Seated Trunk Rotation with Anchored Resistance  - 1 x daily - 7 x weekly - 3 sets - 10 reps - Gastroc Stretch on Wall  - 2 x daily - 7 x weekly - 1 sets - 4 reps - 30 sec hold - Standing Heel Raise with Support  - 1 x daily - 7 x weekly - 3 sets - 10 reps - Side to Side Weight Shift with Overhead Reach and  Counter Support  - 2 x daily - 7 x weekly - 2 sets - 10 reps - Staggered Stance Forward Backward Weight Shift with Counter Support  - 1 x daily - 7 x weekly - 3 sets - 10 reps - Standing Sidebending with Chair Support  - 1 x daily - 7 x weekly - 3 sets - 10 reps - Mini Squat with Chair  - 1 x daily - 7 x weekly - 3 sets - 10 reps  GOALS: Goals reviewed with patient? Yes  SHORT TERM GOALS: Target date: 05/27/2022  Will be compliant with appropriate HEP with no more than MinA from caregiver  Baseline: Goal status: MET   2.  Will be able to verbalize at least 3 ways to reduce fall risk at home and in the community  Baseline:  Goal status: MET   3.  Will be able to complete 5x sit to stand in 20 seconds or less with no posterior LOB to show improved functional mobility  Baseline: 1 min 15 sec 05/26/22 Goal status: IN PROGRESS 12/28:IN PROGRESS 06/19/2022: PROGRESSING ( 4 sec) 07/06/22: PROGRESSING (33 sec)  4.  Will be able to ambulate at least 137ft with no rest breaks and LRAD  Baseline: 2x75' with one seated rest break using RW 05/26/22 Goal status: IN PROGRESS 12/28: PARTIALLY MET 06/19/2022: MET (160' RW)    LONG TERM GOALS: Target date: 10/28/2022  Will be able to complete TUG in 40 seconds or less with LRAD and no more than Min guard to show improved balance/mobility  Baseline:  Goal status: IN PROGRESS 07/06/22: 1 min 22 sec 08/10/22: 1 min 34 sec  2.  Pt will increase BERG balance score to >/=30/56 to demonstrate improved static balance. Baseline: 19/56 (12/4) Goal status: IN PROGRESS (26/56) on 08/10/22 (updated in flowsheet)  3.  Will be able to navigate single step with LRAD and no more than Min guard for safety  Baseline:  Goal status: MET (CGA/close SBA from caretaker)  4.  Pain in R ankle to be no more than 5/10 at worst  Baseline:  Goal status: MET no pain  5.  Will be able to ambulate at least 263ft with LRAD, no rest breaks, and Min guard to show  improved functional mobility  Baseline:  Goal status: MET  6.  Pt will improve gait speed to >= 0.65 ft/sec to demo decreased fall risk Baseline:  Goal status: INITIAL    ASSESSMENT:  CLINICAL IMPRESSION:  Unsupported overhead lifting performed in standing with CGA; cueing to track bar with eyes improved upright standing posture and challenged postural balance.    OBJECTIVE IMPAIRMENTS: Abnormal gait, decreased activity tolerance, decreased balance, decreased coordination, decreased knowledge of use of DME, decreased mobility, difficulty walking, decreased strength, and pain.    PLAN:  PT FREQUENCY: 2x/week  PT DURATION: 6 weeks  PLANNED INTERVENTIONS: Therapeutic exercises, Therapeutic activity, Neuromuscular re-education, Balance training, Gait training, Patient/Family education, Self Care, Joint mobilization, Stair training, DME instructions, Aquatic Therapy, Wheelchair mobility training, Biofeedback, Ionotophoresis 4mg /ml Dexamethasone, Manual therapy, and Re-evaluation  PLAN FOR NEXT SESSION: Progress and challenge dynamic balance and postural stability  Carlynn Herald, PTA 10/14/2022 2:50 PM

## 2022-10-19 ENCOUNTER — Other Ambulatory Visit: Payer: Self-pay

## 2022-10-19 DIAGNOSIS — I739 Peripheral vascular disease, unspecified: Secondary | ICD-10-CM

## 2022-10-20 ENCOUNTER — Telehealth: Payer: Self-pay

## 2022-10-20 MED ORDER — MIRABEGRON ER 25 MG PO TB24: 25.0000 mg | ORAL_TABLET | Freq: Every day | ORAL | 1 refills | Status: DC

## 2022-10-20 NOTE — Telephone Encounter (Signed)
Called and left message for caregiver, Archie Patten to call the office

## 2022-10-20 NOTE — Telephone Encounter (Signed)
Message left on clinical intake voicemail stating that patient is requesting ocybutnin. States that medication works for patient nd he would like to have it. Medication is not on active med list  Message sent to Abbey Chatters, NP

## 2022-10-20 NOTE — Telephone Encounter (Signed)
New Rx for myrbetriq sent to pharmacy, far less side effects but please monitor blood pressure three times weekly for the first few weeks to make sure blood pressure does not go up.

## 2022-10-20 NOTE — Telephone Encounter (Signed)
Spoke with patient's caregiver and she would like to know if there is anything else he can take in the place of it.

## 2022-10-20 NOTE — Telephone Encounter (Signed)
He was having a lot of side effects due to the medication so we stopped it.

## 2022-10-21 NOTE — Telephone Encounter (Signed)
Spoke with Archie Patten and informed her that new medication sent to pharmacy and to check patient's blood pressure. She verbalized her understanding and agreed.

## 2022-10-22 ENCOUNTER — Encounter: Payer: Self-pay | Admitting: Physical Therapy

## 2022-10-22 ENCOUNTER — Ambulatory Visit: Payer: Medicare Other | Admitting: Physical Therapy

## 2022-10-22 DIAGNOSIS — R262 Difficulty in walking, not elsewhere classified: Secondary | ICD-10-CM

## 2022-10-22 DIAGNOSIS — R2689 Other abnormalities of gait and mobility: Secondary | ICD-10-CM

## 2022-10-22 DIAGNOSIS — M79671 Pain in right foot: Secondary | ICD-10-CM

## 2022-10-22 DIAGNOSIS — R260 Ataxic gait: Secondary | ICD-10-CM | POA: Diagnosis not present

## 2022-10-22 DIAGNOSIS — R2681 Unsteadiness on feet: Secondary | ICD-10-CM | POA: Diagnosis not present

## 2022-10-22 NOTE — Therapy (Signed)
OUTPATIENT PHYSICAL THERAPY NEURO TREATMENT   Patient Name: Justin [name] MRN: [id number] DOB:[birth year], 82 y.o., male Today's Date: 10/22/2022 Dates of service 04/29/22-09/23/22 PCP: Abbey Chatters  REFERRING PROVIDER: Sharon Seller, NP    END OF SESSION:  PT End of Session - 10/22/22 1450     Visit Number 44    Number of Visits 45    Date for PT Re-Evaluation 10/28/22    Authorization Type BCBS MCR    PT Start Time 1450    PT Stop Time 1530    PT Time Calculation (min) 40 min    Activity Tolerance Patient tolerated treatment well    Behavior During Therapy [facility] for tasks assessed/performed                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   Diaphragmatic hernia without mention of obstruction or gangrene 1982   Hiatal hernia   Diplopia 2006   Disturbance of skin sensation 2003   Diverticulosis of colon (without mention of hemorrhage) 1997   Dizziness and giddiness 2001   Dysphagia, unspecified(787.20) 1999 and   Esophagitis, unspecified 1997   GERD (gastroesophageal reflux disease) 2003   Hypertension 2003   Impotence of organic origin 1999   Lack of coordination September 07, 2007   Lumbago 2003   Migraine with aura, without mention of intractable migraine without mention of status migrainosus 1997   Myalgia and myositis, unspecified 1999   Other and unspecified hyperlipidemia 2003   Other disorders of vitreous 2003   Other malaise and fatigue 2003   Other seborrheic keratosis 2013   Other seborrheic keratosis 04/13/09   Pain in joint, site unspecified 04/13/12   Personal history of fall 04/15/11   Restless legs syndrome (RLS) 1997    Spinocerebellar disease, 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 Dwain Sarna   C3-4 anterior cervical surgery  1999   Dr. Gerlene Fee   CARDIAC CATHETERIZATION  2001   Dr. Aleen Campi   COLONOSCOPY  07/20/07   Dr. 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   Internal hemorrhoids without complication 08/06/2014   Arthritis 02/21/2014   Dysarthria 02/21/2014   Seborrheic keratosis 02/21/2014   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   Tinnitus 04/12/2013   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HCC) 10/16/2012   Impotence, 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   Essential hypertension    GERD (gastroesophageal reflux disease)    Dyslipidemia    Lumbago    Dysphagia     ONSET DATE: 04/17/2022  REFERRING DIAG: G11.9 (ICD-10-CM) - 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HCC) R26.89 (ICD-10-CM) - Imbalance  THERAPY DIAG:  Difficulty in walking, not elsewhere classified  Other abnormalities of gait and mobility  Ataxic gait  Unsteadiness on feet  Pain of right heel  Rationale for Evaluation and Treatment: Rehabilitation  SUBJECTIVE:                                                                                                                                                                                             SUBJECTIVE STATEMENT:  Pt states he's a little shaky today. Nothing else new  or different.   Pt accompanied by:  caregiver  [name]  PERTINENT HISTORY:  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HCC) -progressive, continues with supportive care - Ambulatory referral to Physical Therapy      PAIN:  Are you having pain? No 0/10  PRECAUTIONS: Fall  WEIGHT BEARING RESTRICTIONS: No  FALLS: Has patient fallen in last 6 months? No; FOF (+), not hesitant to leave the house    OBJECTIVE:  Functional test: 5x STS 05/26/22: 1 min 14 sec 5x STS 06/11/22: 32 sec no UE support 5x STS 07/06/22: 42 sec 5xSTS 09/16/22: 50 sec 5x STS 10/22/22: 23 sec  TUG (1/24): 1:27 TUG (5/23): 1:12  Berg Balance 10/22/22: 31/56  [facility] PT Assessment - 10/22/22 0001       Berg Balance Test   Sit to Stand Able to stand without using hands and stabilize independently    Standing Unsupported Able to stand safely 2 minutes    Sitting with Back Unsupported but Feet Supported on Floor or Stool Able to sit safely and securely 2 minutes    Stand to Sit Controls descent by using hands    Transfers Able to transfer safely, definite need of hands    Standing Unsupported with Eyes Closed Able to stand 10 seconds with supervision    Standing Unsupported with Feet Together Needs help to attain position but able to stand for 30 seconds with feet together    From Standing, Reach Forward with Outstretched Arm Can reach forward >12 cm safely (5")    From Standing Position, Pick up Object from Floor Unable to pick up and needs supervision    From Standing Position, Turn to Look Behind Over each Shoulder Turn sideways only but maintains balance    Turn 360 Degrees Needs assistance while turning    Standing Unsupported, Alternately Place Feet on Step/Stool Able to complete >2 steps/needs minimal assist    Standing Unsupported, One Foot in Front Needs help to step but can hold 15 seconds    Standing on One Leg Tries to  lift leg/unable to hold 3 seconds but remains standing independently    Total Score 31                TODAY'S TREATMENT:   [facility] Adult PT Treatment:                                                DATE: 10/22/22 Therapeutic Activity: Rechecked pt's LTGs (see functional tests above) 5x STS TUG Berg Balance Gait mechanics/gait speed   [facility] Adult PT Treatment:                                                DATE: 10/14/2022 Therapeutic Activity: Discussion on where/what to order for home wheelchair Standing at counter:  Bilateral overhead arm reach + extension stretch Unsupported alternating overhead reach (CGA) Seated with noodle: Scap squeezes Shoulder flexion 2#dowel with eye tracking: seated --> standing CGA   [facility] Adult PT Treatment:                                                DATE: 10/07/2022 Therapeutic Activity: Mat Table: Seated forward/back bend stretch LTR Bridges S/L hip extension kicks --> straight leg hip abd --> add leg raises Seated in chair :  (big noodle) shoulder flexion 2#dowel + gaze tracking bar  Scap squeezes with noodle Side bent stretch (hands behind head) Standing: (no HHA in front of counter, CGA) Standing balance + placing hands behind head Reaching across midline Reaching for notecard to side --> placing it across midline Small ball pick up from side --> arc reach across midline with gaze tracking    [facility] Adult PT Treatment:                                                DATE: 09/30/2022 Therapeutic Activity: Standing at counter:  Overhead reaching with looking Overhead reaches across midline Mat Table: S/L leg kicks front/back (focus on back) S/L clamshells GTB --> bent knee raises GTB Supine figure 4 dynamic hip stretch Supine therapist resisted hip extension + leg extension stretch at end range Wide windshield wiper legs  STS with black super band around posterior pelvis (eccentric/concentric) Standing paloff press GTB Standing alt overhead arm raises (no HHA) Standing side bend stretch   PATIENT EDUCATION: Education details:  Biomedical scientist and d/c plans Person educated: Patient and Caregiver   Education method: Explanation Education comprehension: verbalized understanding  HOME EXERCISE PROGRAM: Access Code: [email or web address] URL: [email or web address] Date: 08/24/2022 Prepared by: Carlynn Herald  Exercises - Sidelying Open Book  - 1 x daily - 7 x weekly - 3 sets - 10 reps - Clam with Resistance  - 1 x daily - 7 x weekly - 3 sets - 10 reps - Sidelying Hip Abduction and Extension with Loop Band  - 1 x daily - 7 x weekly - 3 sets - 10 reps - Lower Trunk Rotations  - 1 x daily - 7 x weekly - 3 sets -  10 reps - Supine Bridge with Mini Swiss Ball Between Knees  - 1 x daily - 7 x weekly - 3 sets - 10 reps - Supine Hamstring Stretch with Strap  - 1 x daily - 7 x weekly - 3 sets - 10 reps - Seated Reach Forward, Up, and To Sides  - 1 x daily - 7 x weekly - 3 sets - 10 reps - Seated Reaching to Side and Across Body  - 2 x daily - 7 x weekly - 2 sets - 10 reps - Sit to Stand with Counter Support  - 1 x daily - 7 x weekly - 3 sets - 10 reps - Seated Shoulder Row with Anchored Resistance  - 1 x daily - 7 x weekly - 3 sets - 10 reps - Seated Anti-Rotation Press With Anchored Resistance  - 1 x daily - 7 x weekly - 3 sets - 10 reps - Seated Trunk Rotation with Anchored Resistance  - 1 x daily - 7 x weekly - 3 sets - 10 reps - Gastroc Stretch on Wall  - 2 x daily - 7 x weekly - 1 sets - 4 reps - 30 sec hold - Standing Heel Raise with Support  - 1 x daily - 7 x weekly - 3 sets - 10 reps - Side to Side Weight Shift with Overhead Reach and Counter Support  - 2 x daily - 7 x weekly - 2 sets - 10 reps - Staggered Stance Forward Backward Weight Shift with Counter Support  - 1 x daily - 7 x weekly - 3 sets - 10 reps - Standing Sidebending with Chair Support  - 1 x daily - 7 x weekly - 3 sets - 10 reps - Mini Squat with Chair  - 1 x daily - 7 x weekly - 3 sets - 10 reps  GOALS: Goals reviewed with patient? Yes  SHORT TERM  GOALS: Target date: 05/27/2022  Will be compliant with appropriate HEP with no more than MinA from caregiver  Baseline: Goal status: MET   2.  Will be able to verbalize at least 3 ways to reduce fall risk at home and in the community  Baseline:  Goal status: MET   3.  Will be able to complete 5x sit to stand in 20 seconds or less with no posterior LOB to show improved functional mobility  Baseline: 1 min 15 sec 05/26/22 Goal status:  12/28:IN PROGRESS 06/19/2022: PROGRESSING ( 4 sec) 07/06/22: PROGRESSING (33 sec) 10/22/22: PARTIALLY MET 23 sec  4.  Will be able to ambulate at least 137ft with no rest breaks and LRAD  Baseline: 2x75' with one seated rest break using RW 05/26/22 Goal status:  12/28: PARTIALLY MET 06/19/2022: MET (160' RW)    LONG TERM GOALS: Target date: 10/28/2022  Will be able to complete TUG in 40 seconds or less with LRAD and no more than Min guard to show improved balance/mobility  Baseline:  Goal status: PARTIALLY MET 07/06/22: 1 min 22 sec 08/10/22: 1 min 34 sec 10/22/22: 1 min 12 sec  2.  Pt will increase BERG balance score to >/=30/56 to demonstrate improved static balance. Baseline: 19/56 (12/4) (26/56) on 08/10/22  Goal status: MET -- 31/56 (updated in flowsheet)  3.  Will be able to navigate single step with LRAD and no more than Min guard for safety  Baseline:  Goal status: MET (CGA/close SBA from caretaker)  4.  Pain in R ankle to be no more  than 5/10 at worst  Baseline:  Goal status: MET no pain  5.  Will be able to ambulate at least 216ft with LRAD, no rest breaks, and Min guard to show improved functional mobility  Baseline:  Goal status: MET   6.  Pt will improve gait speed to >= 0.65 ft/sec to demo decreased fall risk Baseline:  Goal status: PARTIALLY MET -- 0.40 ft/sec 10/22/22    ASSESSMENT:  CLINICAL IMPRESSION:  Treatment focused on rechecking pt's goals to ready him for d/c. Pt has met or partially met all of his LTGs.  Pt's Berg Balance has improved to 31/56 -- able to demonstrating improving weight shift and narrower BOS with less assist. Able to alternate and lift feet off the floor given cues for weight shifting to attempt SLS. TUG score has improved by 10 sec which demonstrates MDC but not to goal level. 5 times sit to stand has also improved by 12 sec but also not yet to goal level. Will focus on finalizing HEP on last visit. Followed up and discussed with pt about his wheelchair needs.    OBJECTIVE IMPAIRMENTS: Abnormal gait, decreased activity tolerance, decreased balance, decreased coordination, decreased knowledge of use of DME, decreased mobility, difficulty walking, decreased strength, and pain.    PLAN:  PT FREQUENCY: 2x/week  PT DURATION: 6 weeks  PLANNED INTERVENTIONS: Therapeutic exercises, Therapeutic activity, Neuromuscular re-education, Balance training, Gait training, Patient/Family education, Self Care, Joint mobilization, Stair training, DME instructions, Aquatic Therapy, Wheelchair mobility training, Biofeedback, Ionotophoresis 4mg /ml Dexamethasone, Manual therapy, and Re-evaluation  PLAN FOR NEXT SESSION: Progress and challenge dynamic balance and postural stability  Austine Kelsay April Ma L Conall Vangorder, PT 10/22/2022 2:50 PM

## 2022-10-28 ENCOUNTER — Encounter: Payer: Medicare Other | Admitting: Physical Therapy

## 2022-10-29 ENCOUNTER — Ambulatory Visit: Payer: Medicare Other

## 2022-10-29 DIAGNOSIS — R2689 Other abnormalities of gait and mobility: Secondary | ICD-10-CM | POA: Diagnosis not present

## 2022-10-29 DIAGNOSIS — R260 Ataxic gait: Secondary | ICD-10-CM

## 2022-10-29 DIAGNOSIS — M79671 Pain in right foot: Secondary | ICD-10-CM | POA: Diagnosis not present

## 2022-10-29 DIAGNOSIS — R262 Difficulty in walking, not elsewhere classified: Secondary | ICD-10-CM

## 2022-10-29 DIAGNOSIS — R2681 Unsteadiness on feet: Secondary | ICD-10-CM

## 2022-10-29 NOTE — Therapy (Addendum)
 OUTPATIENT PHYSICAL THERAPY NEURO TREATMENT AND DISCHARGE  PHYSICAL THERAPY DISCHARGE SUMMARY  Visits from Start of Care: 45  Current functional level related to goals / functional outcomes: See below   Remaining deficits: See goals below   Education / Equipment: HEP   Patient agrees to discharge. Patient goals were met. Patient is being discharged due to meeting the stated rehab goals.  Patient Name: Justin [name] MRN: [id number] DOB:10-18-1940, 82 y.o., male Today's Date: 10/29/2022  Dates of service 04/29/22-09/23/22  PCP: Abbey Chatters  REFERRING PROVIDER: Sharon Seller, NP    END OF SESSION:  PT End of Session - 10/29/22 1102     Visit Number 45    Number of Visits 45    Date for PT Re-Evaluation 10/28/22    Authorization Type BCBS MCR    PT Start Time 1103    PT Stop Time 1148    PT Time Calculation (min) 45 min    Activity Tolerance Patient tolerated treatment well    Behavior During Therapy [facility] for tasks assessed/performed                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   Diaphragmatic hernia without mention of obstruction or gangrene 1982   Hiatal hernia   Diplopia 2006   Disturbance of skin sensation 2003   Diverticulosis of colon (without mention of hemorrhage) 1997   Dizziness and giddiness 2001   Dysphagia, unspecified(787.20) 1999 and   Esophagitis, unspecified 1997   GERD (gastroesophageal reflux disease) 2003   Hypertension 2003   Impotence of organic origin 1999   Lack of coordination September 07, 2007   Lumbago 2003   Migraine with aura, without mention of intractable migraine without mention of status migrainosus  1997   Myalgia and myositis, unspecified 1999   Other and unspecified hyperlipidemia 2003   Other disorders of vitreous 2003   Other malaise and fatigue 2003   Other seborrheic keratosis 2013   Other seborrheic keratosis 04/13/09   Pain in joint, site unspecified 04/13/12   Personal history of fall 04/15/11   Restless legs syndrome (RLS) 1997   Spinocerebellar disease, 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 Dwain Sarna   C3-4 anterior cervical surgery  1999   Dr. Gerlene Fee   CARDIAC CATHETERIZATION  2001   Dr. Aleen Campi   COLONOSCOPY  07/20/07   Dr. 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   Internal hemorrhoids without complication 08/06/2014   Arthritis 02/21/2014  Dysarthria 02/21/2014   Seborrheic keratosis 02/21/2014   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   Tinnitus 04/12/2013   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HCC) 10/16/2012   Impotence, 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   Essential hypertension    GERD (gastroesophageal reflux disease)    Dyslipidemia    Lumbago    Dysphagia     ONSET DATE: 04/17/2022  REFERRING DIAG: G11.9 (ICD-10-CM) - 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HCC) R26.89 (ICD-10-CM) - Imbalance  THERAPY DIAG:  Difficulty in walking, not elsewhere classified  Other abnormalities of gait and mobility  Ataxic gait  Unsteadiness on  feet  Rationale for Evaluation and Treatment: Rehabilitation  SUBJECTIVE:                                                                                                                                                                                             SUBJECTIVE STATEMENT:  Pt states he's a little shaky today. Nothing else new or different.   Pt accompanied by:  caregiver  [name]  PERTINENT HISTORY:  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HCC) -progressive, continues with supportive care - Ambulatory referral to Physical Therapy      PAIN:  Are you having pain? No 0/10  PRECAUTIONS: Fall  WEIGHT BEARING RESTRICTIONS: No  FALLS: Has patient fallen in last 6 months? No; FOF (+), not hesitant to leave the house    OBJECTIVE:  Functional test: 5x STS 05/26/22: 1 min 14 sec 5x STS 06/11/22: 32 sec no UE support 5x STS 07/06/22: 42 sec 5xSTS 09/16/22: 50 sec 5x STS 10/22/22: 23 sec  TUG (1/24): 1:27 TUG (5/23): 1:12  Berg Balance 10/22/22: 31/56   [facility] Adult PT Treatment:                                                DATE: 10/29/2022 Therapeutic Activity: Seated forward bend/extension with hands behind head --> side bend stretch  S/L GTB clamshells --> straight leg hip abd SLR --> leg raises front/back S/L quad stretch w/strap --> passive hip ext stretch Supine bridge + purple super band hip abd iso STS with FWW --> R foot propped on yoga block Adjusting FWW for fit     TODAY'S TREATMENT:   [facility] Adult PT Treatment:                                                DATE: 10/22/22 Therapeutic Activity: Rechecked  pt's LTGs (see functional tests above) 5x STS TUG Berg Balance Gait mechanics/gait speed   [facility] Adult PT Treatment:                                                DATE: 10/14/2022 Therapeutic Activity: Discussion on where/what to order for home wheelchair Standing at counter:  Bilateral overhead arm reach + extension stretch Unsupported alternating overhead reach  (CGA) Seated with noodle: Scap squeezes Shoulder flexion 2#dowel with eye tracking: seated --> standing CGA   PATIENT EDUCATION: Education details: Biomedical scientist and d/c plans Person educated: Patient and Caregiver   Education method: Explanation Education comprehension: verbalized understanding  HOME EXERCISE PROGRAM: Access Code: [email or web address] URL: [email or web address] Date: 08/24/2022 Prepared by: Carlynn Herald  Exercises - Sidelying Open Book  - 1 x daily - 7 x weekly - 3 sets - 10 reps - Clam with Resistance  - 1 x daily - 7 x weekly - 3 sets - 10 reps - Sidelying Hip Abduction and Extension with Loop Band  - 1 x daily - 7 x weekly - 3 sets - 10 reps - Lower Trunk Rotations  - 1 x daily - 7 x weekly - 3 sets - 10 reps - Supine Bridge with Mini Swiss Ball Between Knees  - 1 x daily - 7 x weekly - 3 sets - 10 reps - Supine Hamstring Stretch with Strap  - 1 x daily - 7 x weekly - 3 sets - 10 reps - Seated Reach Forward, Up, and To Sides  - 1 x daily - 7 x weekly - 3 sets - 10 reps - Seated Reaching to Side and Across Body  - 2 x daily - 7 x weekly - 2 sets - 10 reps - Sit to Stand with Counter Support  - 1 x daily - 7 x weekly - 3 sets - 10 reps - Seated Shoulder Row with Anchored Resistance  - 1 x daily - 7 x weekly - 3 sets - 10 reps - Seated Anti-Rotation Press With Anchored Resistance  - 1 x daily - 7 x weekly - 3 sets - 10 reps - Seated Trunk Rotation with Anchored Resistance  - 1 x daily - 7 x weekly - 3 sets - 10 reps - Gastroc Stretch on Wall  - 2 x daily - 7 x weekly - 1 sets - 4 reps - 30 sec hold - Standing Heel Raise with Support  - 1 x daily - 7 x weekly - 3 sets - 10 reps - Side to Side Weight Shift with Overhead Reach and Counter Support  - 2 x daily - 7 x weekly - 2 sets - 10 reps - Staggered Stance Forward Backward Weight Shift with Counter Support  - 1 x daily - 7 x weekly - 3 sets - 10 reps - Standing Sidebending with Chair Support  - 1 x daily - 7 x  weekly - 3 sets - 10 reps - Mini Squat with Chair  - 1 x daily - 7 x weekly - 3 sets - 10 reps  GOALS: Goals reviewed with patient? Yes  SHORT TERM GOALS: Target date: 05/27/2022  Will be compliant with appropriate HEP with no more than MinA from caregiver  Baseline: Goal status: MET   2.  Will be able to verbalize at least 3 ways to reduce fall  risk at home and in the community  Baseline:  Goal status: MET   3.  Will be able to complete 5x sit to stand in 20 seconds or less with no posterior LOB to show improved functional mobility  Baseline: 1 min 15 sec 05/26/22 Goal status:  12/28:IN PROGRESS 06/19/2022: PROGRESSING ( 4 sec) 07/06/22: PROGRESSING (33 sec) 10/22/22: PARTIALLY MET 23 sec  4.  Will be able to ambulate at least 160ft with no rest breaks and LRAD  Baseline: 2x75' with one seated rest break using RW 05/26/22 Goal status:  12/28: PARTIALLY MET 06/19/2022: MET (160' RW)    LONG TERM GOALS: Target date: 10/28/2022  Will be able to complete TUG in 40 seconds or less with LRAD and no more than Min guard to show improved balance/mobility  Baseline:  Goal status: PARTIALLY MET 07/06/22: 1 min 22 sec 08/10/22: 1 min 34 sec 10/22/22: 1 min 12 sec  2.  Pt will increase BERG balance score to >/=30/56 to demonstrate improved static balance. Baseline: 19/56 (12/4) (26/56) on 08/10/22  Goal status: MET -- 31/56 (updated in flowsheet)  3.  Will be able to navigate single step with LRAD and no more than Min guard for safety  Baseline:  Goal status: MET (CGA/close SBA from caretaker)  4.  Pain in R ankle to be no more than 5/10 at worst  Baseline:  Goal status: MET no pain  5.  Will be able to ambulate at least 210ft with LRAD, no rest breaks, and Min guard to show improved functional mobility  Baseline:  Goal status: MET   6.  Pt will improve gait speed to >= 0.65 ft/sec to demo decreased fall risk Baseline:  Goal status: PARTIALLY MET -- 0.40 ft/sec  10/22/22    ASSESSMENT:  CLINICAL IMPRESSION:  Treatment focused on finalizing HEP for summer break. Discussion with patient on finding sliders for FWW that give enough stability and ease of motion to be able to navigate AD safely; patient demonstrated improved upright standing posture while walking with adjusted walker.   OBJECTIVE IMPAIRMENTS: Abnormal gait, decreased activity tolerance, decreased balance, decreased coordination, decreased knowledge of use of DME, decreased mobility, difficulty walking, decreased strength, and pain.    PLAN:  PT FREQUENCY: 2x/week  PT DURATION: 6 weeks  PLANNED INTERVENTIONS: Therapeutic exercises, Therapeutic activity, Neuromuscular re-education, Balance training, Gait training, Patient/Family education, Self Care, Joint mobilization, Stair training, DME instructions, Aquatic Therapy, Wheelchair mobility training, Biofeedback, Ionotophoresis 4mg /ml Dexamethasone, Manual therapy, and Re-evaluation  PLAN FOR NEXT SESSION: Discharge  Sanjuana Mae, PTA 10/29/2022 12:10 PM   Gellen April Ma L Nonato, PT, DPT 08/09/2023, 1:07 PM

## 2022-11-09 ENCOUNTER — Other Ambulatory Visit: Payer: Self-pay | Admitting: Nurse Practitioner

## 2022-12-31 ENCOUNTER — Other Ambulatory Visit: Payer: Self-pay | Admitting: Nurse Practitioner

## 2022-12-31 DIAGNOSIS — G119 Hereditary ataxia, unspecified: Secondary | ICD-10-CM

## 2022-12-31 DIAGNOSIS — R270 Ataxia, unspecified: Secondary | ICD-10-CM

## 2023-01-18 ENCOUNTER — Encounter: Payer: Self-pay | Admitting: Nurse Practitioner

## 2023-01-18 ENCOUNTER — Encounter: Payer: Medicare Other | Admitting: Nurse Practitioner

## 2023-01-19 NOTE — Progress Notes (Signed)
This encounter was created in error - please disregard.

## 2023-01-27 ENCOUNTER — Other Ambulatory Visit: Payer: Self-pay | Admitting: Nurse Practitioner

## 2023-01-27 DIAGNOSIS — I10 Essential (primary) hypertension: Secondary | ICD-10-CM

## 2023-01-27 DIAGNOSIS — R609 Edema, unspecified: Secondary | ICD-10-CM

## 2023-02-12 ENCOUNTER — Other Ambulatory Visit: Payer: Self-pay | Admitting: Nurse Practitioner

## 2023-02-12 DIAGNOSIS — G119 Hereditary ataxia, unspecified: Secondary | ICD-10-CM

## 2023-02-12 MED ORDER — PREGABALIN 25 MG PO CAPS: 25.0000 mg | ORAL_CAPSULE | Freq: Three times a day (TID) | ORAL | 0 refills | Status: DC

## 2023-02-12 NOTE — Telephone Encounter (Signed)
Pharmacy requested refill.  Pended Rx and sent to Amy for approval due to Shanda Bumps out of office.

## 2023-02-12 NOTE — Addendum Note (Signed)
Addended by: Nelda Severe A on: 02/12/2023 02:36 PM   Modules accepted: Orders

## 2023-03-31 ENCOUNTER — Other Ambulatory Visit: Payer: Self-pay | Admitting: Nurse Practitioner

## 2023-03-31 DIAGNOSIS — K219 Gastro-esophageal reflux disease without esophagitis: Secondary | ICD-10-CM

## 2023-04-12 ENCOUNTER — Encounter: Payer: Self-pay | Admitting: Nurse Practitioner

## 2023-04-12 ENCOUNTER — Ambulatory Visit (INDEPENDENT_AMBULATORY_CARE_PROVIDER_SITE_OTHER): Payer: Medicare Other | Admitting: Nurse Practitioner

## 2023-04-12 VITALS — BP 128/76 | HR 67 | Temp 97.1°F | Ht 67.0 in | Wt 177.0 lb

## 2023-04-12 DIAGNOSIS — N3281 Overactive bladder: Secondary | ICD-10-CM

## 2023-04-12 DIAGNOSIS — E782 Mixed hyperlipidemia: Secondary | ICD-10-CM | POA: Diagnosis not present

## 2023-04-12 DIAGNOSIS — I739 Peripheral vascular disease, unspecified: Secondary | ICD-10-CM

## 2023-04-12 DIAGNOSIS — I10 Essential (primary) hypertension: Secondary | ICD-10-CM | POA: Diagnosis not present

## 2023-04-12 DIAGNOSIS — E1169 Type 2 diabetes mellitus with other specified complication: Secondary | ICD-10-CM | POA: Diagnosis not present

## 2023-04-12 DIAGNOSIS — G118 Other hereditary ataxias: Secondary | ICD-10-CM | POA: Diagnosis not present

## 2023-04-12 NOTE — Progress Notes (Unsigned)
Careteam: Patient Care Team: Sharon Seller, NP as PCP - General (Geriatric Medicine) Nahser, Deloris Ping, MD as PCP - Cardiology (Cardiology) Tat, Octaviano Batty, DO as Consulting Physician (Neurology) Gelene Mink, OD as Referring Physician (Optometry)  PLACE OF SERVICE:  [facility]  Advanced Directive information Does Patient Have a Medical Advance Directive?: Yes, Type of Advance Directive: Out of facility DNR (pink MOST or yellow form), Pre-existing out of facility DNR order (yellow form or pink MOST form): Yellow form placed in chart (order not valid for inpatient use), Does patient want to make changes to medical advance directive?: No - Patient declined  Allergies  Allergen Reactions   Effexor [Venlafaxine Hcl]    Serzone [Nefazodone]    Vivactil [Protriptyline Hcl]     Chief Complaint  Patient presents with   Medical Management of Chronic Issues    4 month follow-up. Discuss need for td/tdap and flu vaccine (will get at pharmacy) . [facility] verified. Discuss Tricor, patient stopped x 1-2 month due to constipation. Discuss Myrbetriq, patient never started.      HPI: Patient is a 82 y.o. male for routine follow up.   Has been cutting back on his sweets, he lost 5 lbs.  Daughter brought in a huge cheesecake and they have been eating on this since his birthday.   Tricor was causing constipation so he stopped. It was causing a lot of issues when medication stopped constipation and joint pains improved.   Had not started myrbetriq but wants to start now.  Has been taking super beta prostate to help with urinary symptoms.   Uses lyrica twice daily for pain. Was taking 3 times a daily but this was too much  GERD- controlled on protonix.   Blood pressure well controlled. Reports overall he is feeling good.  Plans to go back to the beach later this week  Getting exercises in every morning.  Balance is off so uses caregiver to help.  Goes to bathroom by himself Still  dresses himself. Also able to get breakfast himself.     Review of Systems:  Review of Systems  Constitutional:  Negative for chills, fever and weight loss.  HENT:  Negative for tinnitus.   Respiratory:  Negative for cough, sputum production and shortness of breath.   Cardiovascular:  Negative for chest pain, palpitations and leg swelling.  Gastrointestinal:  Negative for abdominal pain, constipation, diarrhea and heartburn.  Genitourinary:  Negative for dysuria, frequency and urgency.  Musculoskeletal:  Negative for back pain, falls, joint pain and myalgias.  Skin: Negative.   Neurological:  Negative for dizziness and headaches.  Psychiatric/Behavioral:  Negative for depression and memory loss. The patient does not have insomnia.   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   Diaphragmatic hernia without mention of obstruction or gangrene 1982   Hiatal hernia   Diplopia 2006   Disturbance of skin sensation 2003   Diverticulosis of colon (without mention of hemorrhage) 1997   Dizziness and giddiness 2001   Dysphagia, unspecified(787.20) 1999 and   Esophagitis, unspecified 1997   GERD (gastroesophageal reflux disease) 2003   Hypertension 2003   Impotence  of organic origin 1999   Lack of coordination September 07, 2007   Lumbago 2003   Migraine with aura, without mention of intractable migraine without mention of status migrainosus 1997   Myalgia and myositis, unspecified 1999   Other and unspecified hyperlipidemia 2003   Other disorders of vitreous 2003   Other malaise and fatigue 2003   Other seborrheic keratosis 2013   Other seborrheic keratosis 04/13/09   Pain in joint,  site unspecified 04/13/12   Personal history of fall 04/15/11   Restless legs syndrome (RLS) 1997   Spinocerebellar disease, 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 Dwain Sarna   C3-4 anterior cervical surgery  1999   Dr. Gerlene Fee   CARDIAC CATHETERIZATION  2001   Dr. Aleen Campi   COLONOSCOPY  07/20/07   Dr. Jarold Motto   EYE SURGERY Bilateral 2015   cataracts   LAMINOTOMY / EXCISION DISK POSTERIOR CERVICAL SPINE     Spinal Disk (?4-5)   Social History:   reports that he has never smoked. He quit smokeless tobacco use about 43 years ago.  His smokeless tobacco use included chew. He reports that he does not currently use alcohol. He reports that he does not use drugs.  Family History  Problem Relation Age of Onset   Heart disease Father        SCA with multiple family members with SCA   Diabetes Neg Hx    Colon cancer Neg Hx    Colon polyps Neg Hx    Esophageal cancer Neg Hx    Kidney disease Neg Hx    Gallbladder disease Neg Hx     Medications: Patient's Medications  New Prescriptions   No medications on file  Previous Medications   ACETAMINOPHEN (TYLENOL 8 HOUR ARTHRITIS PAIN) 650 MG CR TABLET    Take 1 tablet (650 mg total) by mouth 2 (two) times daily.   AMLODIPINE (NORVASC) 5 MG TABLET    TAKE 1 TABLET BY MOUTH EVERY DAY   HYDROCHLOROTHIAZIDE (HYDRODIURIL) 12.5 MG TABLET    TAKE 1 TABLET BY MOUTH EVERY DAY   LOSARTAN (COZAAR) 100 MG TABLET    TAKE 1 TABLET (100 MG TOTAL) BY MOUTH DAILY   MIRABEGRON ER (MYRBETRIQ) 25 MG TB24 TABLET    Take 1 tablet (25 mg total) by mouth daily.   PANTOPRAZOLE (PROTONIX) 40 MG TABLET    TAKE 1 TABLET BY MOUTH EVERY DAY   POTASSIUM CHLORIDE (KLOR-CON) 10 MEQ TABLET    TAKE 1 TABLET BY MOUTH EVERY DAY   PREGABALIN (LYRICA) 25 MG CAPSULE    Take 1 capsule (25 mg total) by mouth 3 (three) times  daily.   PREGABALIN (LYRICA) 25 MG CAPSULE    Take 25 mg by mouth 2 (two) times daily.   PROBIOTIC PRODUCT (PROBIOTIC DAILY PO)    Take 1 tablet by mouth daily.   PROPRANOLOL (INDERAL) 40 MG TABLET    TAKE 1 TABLET BY MOUTH TWICE A DAY   UNABLE TO FIND    Take 1 tablet by mouth 2 (two) times daily. Med Name: Super Beta Prostate  Modified Medications   No medications on file  Discontinued Medications   FENOFIBRATE (TRICOR) 145 MG TABLET    TAKE 1 TABLET BY MOUTH EVERY DAY    Physical Exam:  Vitals:   04/12/23  1100  BP: 128/76  Pulse: 67  Temp: (!) 97.1 F (36.2 C)  TempSrc: Temporal  SpO2: 97%  Weight: 177 lb (80.3 kg)  Height: 5\' 7"  (1.702 m)   Body mass index is 27.72 kg/m. Wt Readings from Last 3 Encounters:  04/12/23 177 lb (80.3 kg)  09/25/22 182 lb 3.2 oz (82.6 kg)  06/04/22 179 lb (81.2 kg)    Physical Exam Constitutional:      General: He is not in acute distress.    Appearance: He is well-developed. He is not diaphoretic.  HENT:     Head: Normocephalic and atraumatic.     Right Ear: External ear normal.     Left Ear: External ear normal.     Mouth/Throat:     Pharynx: No oropharyngeal exudate.  Eyes:     Conjunctiva/sclera: Conjunctivae normal.     Pupils: Pupils are equal, round, and reactive to light.  Cardiovascular:     Rate and Rhythm: Normal rate and regular rhythm.     Heart sounds: Normal heart sounds.  Pulmonary:     Effort: Pulmonary effort is normal.     Breath sounds: Normal breath sounds.  Abdominal:     General: Bowel sounds are normal.     Palpations: Abdomen is soft.  Musculoskeletal:        General: No tenderness.     Cervical back: Normal range of motion and neck supple.     Right lower leg: No edema.     Left lower leg: No edema.  Skin:    General: Skin is warm and dry.  Neurological:     Mental Status: He is alert and oriented to person, place, and time.   ***  Labs reviewed: Basic Metabolic Panel: Recent Labs     05/04/22 0836 09/25/22 1430  NA 140 137  K 4.3 4.4  CL 102 101  CO2 26 26  GLUCOSE 113* 106*  BUN 18 18  CREATININE 1.06 1.37*  CALCIUM 9.7 10.5*   Liver Function Tests: Recent Labs    05/04/22 0836 09/25/22 1430  AST 17 26  ALT 25 44  BILITOT 0.6 0.5  PROT 6.6 6.5   No results for input(s): "LIPASE", "AMYLASE" in the last 8760 hours. No results for input(s): "AMMONIA" in the last 8760 hours. CBC: Recent Labs    05/04/22 0836 09/25/22 1430  WBC 8.3 9.2  NEUTROABS 4,399 4,462  HGB 14.9 14.9  HCT 43.2 43.2  MCV 90.4 88.3  PLT 232 299   Lipid Panel: Recent Labs    05/04/22 0836 07/08/22 0853  CHOL 201* 174  HDL 42 44  LDLCALC 107* 90  TRIG 392* 278*  CHOLHDL 4.8 4.0   TSH: No results for input(s): "TSH" in the last 8760 hours. A1C: Lab Results  Component Value Date   HGBA1C 6.7 (H) 09/25/2022     Assessment/Plan 1. Need for influenza vaccination ***   No follow-ups on file.: ***  Marcia Lepera K. Biagio Borg [facility] [phone]

## 2023-04-12 NOTE — Patient Instructions (Signed)
Schedule fasting lab appt later this week

## 2023-04-16 ENCOUNTER — Other Ambulatory Visit: Payer: Medicare Other

## 2023-04-16 DIAGNOSIS — E782 Mixed hyperlipidemia: Secondary | ICD-10-CM | POA: Diagnosis not present

## 2023-04-16 DIAGNOSIS — I10 Essential (primary) hypertension: Secondary | ICD-10-CM

## 2023-04-16 DIAGNOSIS — E1169 Type 2 diabetes mellitus with other specified complication: Secondary | ICD-10-CM

## 2023-04-17 LAB — COMPLETE METABOLIC PANEL WITH GFR
AG Ratio: 1.9 (calc) (ref 1.0–2.5)
ALT: 30 U/L (ref 9–46)
AST: 18 U/L (ref 10–35)
Albumin: 4.4 g/dL (ref 3.6–5.1)
Alkaline phosphatase (APISO): 124 U/L (ref 35–144)
BUN: 17 mg/dL (ref 7–25)
CO2: 27 mmol/L (ref 20–32)
Calcium: 10 mg/dL (ref 8.6–10.3)
Chloride: 102 mmol/L (ref 98–110)
Creat: 0.95 mg/dL (ref 0.70–1.22)
Globulin: 2.3 g/dL (ref 1.9–3.7)
Glucose, Bld: 135 mg/dL — ABNORMAL HIGH (ref 65–99)
Potassium: 4.6 mmol/L (ref 3.5–5.3)
Sodium: 138 mmol/L (ref 135–146)
Total Bilirubin: 0.5 mg/dL (ref 0.2–1.2)
Total Protein: 6.7 g/dL (ref 6.1–8.1)
eGFR: 80 mL/min/{1.73_m2} (ref >=60)

## 2023-04-17 LAB — CBC WITH DIFFERENTIAL/PLATELET
Absolute Lymphocytes: 3384 {cells}/uL (ref 850–3900)
Absolute Monocytes: 684 {cells}/uL (ref 200–950)
Basophils Absolute: 54 {cells}/uL (ref 0–200)
Basophils Relative: 0.6 %
Eosinophils Absolute: 243 {cells}/uL (ref 15–500)
Eosinophils Relative: 2.7 %
HCT: 43.3 % (ref 38.5–50.0)
Hemoglobin: 14.5 g/dL (ref 13.2–17.1)
MCH: 30.3 pg (ref 27.0–33.0)
MCHC: 33.5 g/dL (ref 32.0–36.0)
MCV: 90.4 fL (ref 80.0–100.0)
MPV: 11.2 fL (ref 7.5–12.5)
Monocytes Relative: 7.6 %
Neutro Abs: 4635 {cells}/uL (ref 1500–7800)
Neutrophils Relative %: 51.5 %
Platelets: 251 10*3/uL (ref 140–400)
RBC: 4.79 10*6/uL (ref 4.20–5.80)
RDW: 12.6 % (ref 11.0–15.0)
Total Lymphocyte: 37.6 %
WBC: 9 10*3/uL (ref 3.8–10.8)

## 2023-04-17 LAB — LIPID PANEL
Cholesterol: 217 mg/dL — ABNORMAL HIGH (ref <200)
HDL: 43 mg/dL (ref >=40)
LDL Cholesterol (Calc): 120 mg/dL — ABNORMAL HIGH
Non-HDL Cholesterol (Calc): 174 mg/dL — ABNORMAL HIGH (ref <130)
Total CHOL/HDL Ratio: 5 (calc) — ABNORMAL HIGH (ref <5.0)
Triglycerides: 378 mg/dL — ABNORMAL HIGH (ref <150)

## 2023-04-17 LAB — HEMOGLOBIN A1C
Hgb A1c MFr Bld: 7.1 %{Hb} — ABNORMAL HIGH (ref <5.7)
Mean Plasma Glucose: 157 mg/dL
eAG (mmol/L): 8.7 mmol/L

## 2023-04-17 LAB — MICROALBUMIN / CREATININE URINE RATIO
Creatinine, Urine: 73 mg/dL (ref 20–320)
Microalb Creat Ratio: 7 mg/g{creat} (ref <30)
Microalb, Ur: 0.5 mg/dL

## 2023-04-23 ENCOUNTER — Other Ambulatory Visit: Payer: Self-pay | Admitting: Nurse Practitioner

## 2023-04-23 DIAGNOSIS — E782 Mixed hyperlipidemia: Secondary | ICD-10-CM

## 2023-04-23 MED ORDER — ATORVASTATIN CALCIUM 10 MG PO TABS: 10.0000 mg | ORAL_TABLET | Freq: Every day | ORAL | 0 refills | Status: DC

## 2023-05-06 ENCOUNTER — Other Ambulatory Visit: Payer: Self-pay | Admitting: Nurse Practitioner

## 2023-05-06 ENCOUNTER — Other Ambulatory Visit: Payer: Self-pay

## 2023-05-06 MED ORDER — LOSARTAN POTASSIUM 100 MG PO TABS: 100.0000 mg | ORAL_TABLET | Freq: Every day | ORAL | 2 refills | Status: DC

## 2023-06-04 ENCOUNTER — Other Ambulatory Visit: Payer: Medicare Other

## 2023-06-04 ENCOUNTER — Other Ambulatory Visit: Payer: Self-pay | Admitting: Orthopedic Surgery

## 2023-06-04 DIAGNOSIS — G119 Hereditary ataxia, unspecified: Secondary | ICD-10-CM

## 2023-06-04 DIAGNOSIS — E782 Mixed hyperlipidemia: Secondary | ICD-10-CM

## 2023-06-04 NOTE — Telephone Encounter (Signed)
 Pharmacy requested refill.  Epic LR: 02/12/2023  Pended Rx and sent to [facility] for approval.

## 2023-06-09 ENCOUNTER — Other Ambulatory Visit: Payer: Medicare Other

## 2023-06-09 DIAGNOSIS — E782 Mixed hyperlipidemia: Secondary | ICD-10-CM | POA: Diagnosis not present

## 2023-06-10 LAB — COMPLETE METABOLIC PANEL WITH GFR
AG Ratio: 1.9 (calc) (ref 1.0–2.5)
ALT: 19 U/L (ref 9–46)
AST: 16 U/L (ref 10–35)
Albumin: 4.2 g/dL (ref 3.6–5.1)
Alkaline phosphatase (APISO): 124 U/L (ref 35–144)
BUN: 15 mg/dL (ref 7–25)
CO2: 28 mmol/L (ref 20–32)
Calcium: 10 mg/dL (ref 8.6–10.3)
Chloride: 102 mmol/L (ref 98–110)
Creat: 0.94 mg/dL (ref 0.70–1.22)
Globulin: 2.2 g/dL (ref 1.9–3.7)
Glucose, Bld: 125 mg/dL — ABNORMAL HIGH (ref 65–99)
Potassium: 4.5 mmol/L (ref 3.5–5.3)
Sodium: 140 mmol/L (ref 135–146)
Total Bilirubin: 0.4 mg/dL (ref 0.2–1.2)
Total Protein: 6.4 g/dL (ref 6.1–8.1)
eGFR: 81 mL/min/{1.73_m2} (ref >=60)

## 2023-06-10 LAB — LIPID PANEL
Cholesterol: 135 mg/dL (ref <200)
HDL: 40 mg/dL (ref >=40)
LDL Cholesterol (Calc): 62 mg/dL
Non-HDL Cholesterol (Calc): 95 mg/dL (ref <130)
Total CHOL/HDL Ratio: 3.4 (calc) (ref <5.0)
Triglycerides: 279 mg/dL — ABNORMAL HIGH (ref <150)

## 2023-06-14 ENCOUNTER — Other Ambulatory Visit: Payer: Self-pay | Admitting: Nurse Practitioner

## 2023-06-14 NOTE — Telephone Encounter (Signed)
 Patient was given refill on medication that was last refilled 04/12/2023 with 30 tablets and 1 refill. I pended 180 day supply for patient for approval.

## 2023-07-01 ENCOUNTER — Other Ambulatory Visit: Payer: Self-pay | Admitting: Nurse Practitioner

## 2023-07-01 DIAGNOSIS — G119 Hereditary ataxia, unspecified: Secondary | ICD-10-CM

## 2023-07-01 DIAGNOSIS — R270 Ataxia, unspecified: Secondary | ICD-10-CM

## 2023-08-05 ENCOUNTER — Other Ambulatory Visit: Payer: Self-pay | Admitting: Nurse Practitioner

## 2023-08-05 DIAGNOSIS — I10 Essential (primary) hypertension: Secondary | ICD-10-CM

## 2023-08-07 ENCOUNTER — Other Ambulatory Visit: Payer: Self-pay | Admitting: Nurse Practitioner

## 2023-08-07 DIAGNOSIS — I10 Essential (primary) hypertension: Secondary | ICD-10-CM

## 2023-08-07 DIAGNOSIS — R609 Edema, unspecified: Secondary | ICD-10-CM

## 2023-08-08 ENCOUNTER — Other Ambulatory Visit: Payer: Self-pay | Admitting: Nurse Practitioner

## 2023-08-08 DIAGNOSIS — E782 Mixed hyperlipidemia: Secondary | ICD-10-CM

## 2023-08-10 DIAGNOSIS — H52223 Regular astigmatism, bilateral: Secondary | ICD-10-CM | POA: Diagnosis not present

## 2023-08-10 DIAGNOSIS — Z961 Presence of intraocular lens: Secondary | ICD-10-CM | POA: Diagnosis not present

## 2023-08-10 DIAGNOSIS — H5203 Hypermetropia, bilateral: Secondary | ICD-10-CM | POA: Diagnosis not present

## 2023-08-10 DIAGNOSIS — H524 Presbyopia: Secondary | ICD-10-CM | POA: Diagnosis not present

## 2023-08-10 DIAGNOSIS — H26492 Other secondary cataract, left eye: Secondary | ICD-10-CM | POA: Diagnosis not present

## 2023-08-31 ENCOUNTER — Ambulatory Visit: Payer: Medicare Other | Admitting: Nurse Practitioner

## 2023-08-31 DIAGNOSIS — Z0000 Encounter for general adult medical examination without abnormal findings: Secondary | ICD-10-CM

## 2023-08-31 NOTE — Progress Notes (Signed)
 Subjective:   Justin [name] is a 83 y.o. male who presents for Medicare Annual/Subsequent preventive examination.  Visit Complete: Virtual I connected with  [name] on 08/31/23 by a video and audio enabled telemedicine application and verified that I am speaking with the correct person using two identifiers.  Patient Location: Home  Provider Location: Office/Clinic  I discussed the limitations of evaluation and management by telemedicine. The patient expressed understanding and agreed to proceed.  Vital Signs: Because this visit was a virtual/telehealth visit, some criteria may be missing or patient reported. Any vitals not documented were not able to be obtained and vitals that have been documented are patient reported.    Cardiac Risk Factors include: advanced age (>78men, >33 women);hypertension;dyslipidemia;sedentary lifestyle;male gender     Objective:    There were no vitals filed for this visit. There is no height or weight on file to calculate BMI.     08/31/2023   10:04 AM 04/12/2023   10:48 AM 01/18/2023   11:06 AM 08/25/2022    8:14 AM 04/29/2022    1:19 PM 04/17/2022    8:59 AM 08/18/2021    8:43 AM  Advanced Directives  Does Patient Have a Medical Advance Directive? Yes Yes Yes Yes Yes Yes Yes  Type of Advance Directive Out of facility DNR (pink MOST or yellow form) Out of facility DNR (pink MOST or yellow form) Out of facility DNR (pink MOST or yellow form) Out of facility DNR (pink MOST or yellow form) Healthcare Power of [place];Living will Out of facility DNR (pink MOST or yellow form) Out of facility DNR (pink MOST or yellow form)  Does patient want to make changes to medical advance directive? No - Patient declined No - Patient declined No - Patient declined No - Patient declined  No - Patient declined No - Patient declined  Copy of Healthcare Power of Attorney in Chart?     No - copy requested    Pre-existing out of facility DNR order (yellow form or  pink MOST form) Yellow form placed in chart (order not valid for inpatient use) Yellow form placed in chart (order not valid for inpatient use) Yellow form placed in chart (order not valid for inpatient use) Yellow form placed in chart (order not valid for inpatient use)  Yellow form placed in chart (order not valid for inpatient use) Yellow form placed in chart (order not valid for inpatient use)    Current Medications (verified) Outpatient Encounter Medications as of 08/31/2023  Medication Sig   acetaminophen (TYLENOL 8 HOUR ARTHRITIS PAIN) 650 MG CR tablet Take 1 tablet (650 mg total) by mouth 2 (two) times daily.   amLODipine (NORVASC) 5 MG tablet TAKE 1 TABLET BY MOUTH EVERY DAY   atorvastatin (LIPITOR) 10 MG tablet TAKE 1 TABLET BY MOUTH EVERY DAY   hydrochlorothiazide (HYDRODIURIL) 12.5 MG tablet TAKE 1 TABLET BY MOUTH EVERY DAY   losartan (COZAAR) 100 MG tablet Take 1 tablet (100 mg total) by mouth daily.   MYRBETRIQ 25 MG TB24 tablet TAKE 1 TABLET (25 MG TOTAL) BY MOUTH DAILY.   pantoprazole (PROTONIX) 40 MG tablet TAKE 1 TABLET BY MOUTH EVERY DAY   potassium chloride (KLOR-CON) 10 MEQ tablet TAKE 1 TABLET BY MOUTH EVERY DAY   pregabalin (LYRICA) 25 MG capsule TAKE 1 CAPSULE BY MOUTH 3 TIMES DAILY.   Probiotic Product (PROBIOTIC DAILY PO) Take 1 tablet by mouth daily.   propranolol (INDERAL) 40 MG tablet TAKE 1 TABLET BY  MOUTH TWICE A DAY   UNABLE TO FIND Take 1 tablet by mouth 2 (two) times daily. Med Name: Super Beta Prostate   No facility-administered encounter medications on file as of 08/31/2023.    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   Diaphragmatic hernia without mention of obstruction or gangrene 1982   Hiatal hernia   Diplopia 2006   Disturbance of skin sensation 2003   Diverticulosis of colon (without mention of hemorrhage) 1997   Dizziness and giddiness 2001   Dysphagia, unspecified(787.20) 1999 and   Esophagitis, unspecified 1997   GERD (gastroesophageal reflux disease) 2003   Hypertension 2003   Impotence of organic origin 1999   Lack of coordination September 07, 2007   Lumbago 2003   Migraine with aura, without mention of intractable migraine without mention of status migrainosus 1997   Myalgia and myositis, unspecified 1999   Other and unspecified hyperlipidemia 2003   Other disorders of vitreous 2003   Other malaise and fatigue 2003   Other seborrheic keratosis 2013   Other seborrheic keratosis 04/13/09   Pain in joint, site unspecified 04/13/12   Personal history of fall 04/15/11   Restless legs syndrome (RLS) 1997   Spinocerebellar disease, 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 Dwain Sarna   C3-4 anterior cervical surgery  1999   Dr. Gerlene Fee   CARDIAC CATHETERIZATION  2001   Dr. Aleen Campi   COLONOSCOPY  07/20/07   Dr. Jarold Motto   EYE SURGERY Bilateral 2015   cataracts   LAMINOTOMY / EXCISION DISK POSTERIOR CERVICAL SPINE     Spinal Disk (?4-5)   Family History  Problem Relation Age of Onset   Heart disease Father        SCA with multiple family members with SCA   Diabetes Neg Hx    Colon cancer Neg Hx    Colon polyps Neg Hx    Esophageal cancer Neg Hx    Kidney disease Neg Hx    Gallbladder disease Neg Hx    Social History   Socioeconomic History   Marital status: Widowed    Spouse name: Not on file   Number of children: 2   Years of education:  Not on file   Highest education level: Not on file  Occupational History   Occupation: Real Estate  Tobacco Use   Smoking status: Never   Smokeless tobacco: Former    Types: Chew    Quit date: 06/02/1979   Tobacco comments:    Stopped Chew about 1981  Vaping Use   Vaping status: Never Used  Substance and Sexual Activity   Alcohol use: Not Currently   Drug use: No   Sexual activity: Yes  Other Topics Concern   Not on file  Social History Narrative   Right handed   Drinks caffeine   One story home   Social Drivers of Health   Financial  Resource Strain: Low Risk  (04/04/2018)   Overall Financial Resource Strain (CARDIA)    Difficulty of Paying Living Expenses: Not hard at all  Food Insecurity: No Food Insecurity (04/04/2018)   Hunger Vital Sign    Worried About Running Out of Food in the Last Year: Never true    Ran Out of Food in the Last Year: Never true  Transportation Needs: No Transportation Needs (04/04/2018)   PRAPARE - Administrator, Civil Service (Medical): No    Lack of Transportation (Non-Medical): No  Physical Activity: Insufficiently Active (04/04/2018)   Exercise Vital Sign    Days of Exercise per Week: 3 days    Minutes of Exercise per Session: 30 min  Stress: No Stress Concern Present (04/04/2018)   [employer] of Occupational Health - Occupational Stress Questionnaire    Feeling of Stress : Not at all  Social Connections: Somewhat Isolated (04/04/2018)   Social Connection and Isolation Panel [NHANES]    Frequency of Communication with Friends and Family: More than three times a week    Frequency of Social Gatherings with Friends and Family: More than three times a week    Attends Religious Services: Never    Database administrator or Organizations: No    Attends Banker Meetings: Never    Marital Status: Married    Tobacco Counseling Counseling given: Not Answered Tobacco comments: Physicist, medical about 1981   Clinical  Intake:  Pre-visit preparation completed: Yes  Pain : No/denies pain     BMI - recorded: 27 Nutritional Status: BMI 25 -29 Overweight Diabetes: No  How often do you need to have someone help you when you read instructions, pamphlets, or other written materials from your doctor or pharmacy?: 1 - Never         Activities of Daily Living    08/31/2023   10:25 AM 08/31/2023   10:20 AM  In your present state of health, do you have any difficulty performing the following activities:  Preparing Food and eating ?  N  Using the Toilet? N   In the past six months, have you accidently leaked urine? Y   Do you have problems with loss of bowel control? N   Managing your Medications? Y   Comment has help   Managing your Finances? N   Housekeeping or managing your Housekeeping? Y   Comment has helped     Patient Care Team: Sharon Seller, NP as PCP - General (Geriatric Medicine) Nahser, Deloris Ping, MD as PCP - Cardiology (Cardiology) Tat, Octaviano Batty, DO as Consulting Physician (Neurology) Gelene Mink, OD as Referring Physician (Optometry)  Indicate any recent Medical Services you may have received from other than [facility] providers in the past year (date may be approximate).     Assessment:   This is a routine wellness examination for [facility].  Hearing/Vision screen Hearing Screening - Comments:: No hearing issues Vision Screening - Comments:: Last eye exam less than 12 months ago, with Dr Laurita Quint    Goals Addressed   None    Depression Screen    08/31/2023   10:02 AM 04/12/2023    1:32 PM 08/25/2022    9:40 AM 04/17/2022   10:49 AM 04/07/2021    1:36 PM 01/03/2021    1:43 PM 08/12/2020    3:59 PM  PHQ 2/9 Scores  PHQ - 2 Score 0 0 0 0 0 0 0    Fall Risk    08/31/2023  10:02 AM 04/12/2023    1:32 PM 08/25/2022    9:40 AM 04/17/2022   10:49 AM 12/15/2021    1:42 PM  Fall Risk   Falls in the past year? 0 0 0 0 1  Number falls in past yr: 0 0 0 0 0  Injury with Fall? 0 0 0  0 0  Risk for fall due to : No Fall Risks No Fall Risks No Fall Risks No Fall Risks History of fall(s)  Follow up Falls evaluation completed Falls evaluation completed Falls evaluation completed Falls evaluation completed     MEDICARE RISK AT HOME: Medicare Risk at Home Any stairs in or around the home?: No  TIMED UP AND GO:  Was the test performed?  No    Cognitive Function:    04/04/2018    9:29 AM  MMSE - Mini Mental State Exam  Orientation to time 5  Orientation to Place 5  Registration 3  Attention/ Calculation 5  Recall 2  Language- name 2 objects 2  Language- repeat 1  Language- follow 3 step command 3  Language- read & follow direction 1  Write a sentence 1  Copy design 1  Total score 29        08/31/2023   10:05 AM 08/25/2022    9:44 AM 08/12/2020    4:01 PM  6CIT Screen  What Year? 0 points 0 points 0 points  What month? 0 points 0 points 0 points  What time? 0 points 0 points 0 points  Count back from 20 0 points 0 points 0 points  Months in reverse 0 points 2 points 0 points  Repeat phrase 4 points 0 points 0 points  Total Score 4 points 2 points 0 points    Immunizations Immunization History  Administered Date(s) Administered   DTaP 06/12/2011   Fluad Quad(high Dose 65+) 01/23/2019, 02/19/2020, 03/26/2022   Influenza Whole 03/01/2013   Influenza, High Dose Seasonal PF 03/03/2017, 02/17/2018, 03/15/2021, 04/12/2023   Influenza,inj,Quad PF,6+ Mos 02/21/2014, 02/20/2015   Influenza-Unspecified 01/31/2016   PFIZER(Purple Top)SARS-COV-2 Vaccination 06/11/2019, 07/02/2019, 04/12/2020, 01/14/2021   Pneumococcal Conjugate-13 10/11/2013   Pneumococcal Polysaccharide-23 03/01/2005, 04/01/2017   Tdap 06/12/2011, 04/12/2023   Zoster Recombinant(Shingrix) 08/23/2020, 11/26/2020   Zoster, Live 10/15/2010    TDAP status: Up to date  Flu Vaccine status: Up to date  Pneumococcal vaccine status: Up to date  Covid-19 vaccine status: Information provided on  how to obtain vaccines.   Qualifies for Shingles Vaccine? Yes   Zostavax completed No   Shingrix Completed?: Yes  Screening Tests Health Maintenance  Topic Date Due   INFLUENZA VACCINE  12/31/2023   Diabetic kidney evaluation - Urine ACR  04/15/2024   Diabetic kidney evaluation - eGFR measurement  06/08/2024   Medicare Annual Wellness (AWV)  08/30/2024   DTaP/Tdap/Td (4 - Td or Tdap) 04/11/2033   Pneumonia Vaccine 51+ Years old  Completed   Zoster Vaccines- Shingrix  Completed   HPV VACCINES  Aged Out   COVID-19 Vaccine  Discontinued    Health Maintenance  There are no preventive care reminders to display for this patient.   Colorectal cancer screening: No longer required.   Lung Cancer Screening: (Low Dose CT Chest recommended if Age 83-80 years, 20 pack-year currently smoking OR have quit w/in 15years.) does not qualify.   Lung Cancer Screening Referral: na  Additional Screening:  Hepatitis C Screening: does not qualify; Completed   Vision Screening: Recommended annual ophthalmology exams for early detection of  glaucoma and other disorders of the eye. Is the patient up to date with their annual eye exam?  Yes  Who is the provider or what is the name of the office in which the patient attends annual eye exams? [facility]  If pt is not established with a provider, would they like to be referred to a provider to establish care? No .   Dental Screening: Recommended annual dental exams for proper oral hygiene   Community Resource Referral / Chronic Care Management: CRR required this visit?  No   CCM required this visit?  No     Plan:     I have personally reviewed and noted the following in the patient's chart:   Medical and social history Use of alcohol, tobacco or illicit drugs  Current medications and supplements including opioid prescriptions. Patient is not currently taking opioid prescriptions. Functional ability and status Nutritional status Physical  activity Advanced directives List of other physicians Hospitalizations, surgeries, and ER visits in previous 12 months Vitals Screenings to include cognitive, depression, and falls Referrals and appointments  In addition, I have reviewed and discussed with patient certain preventive protocols, quality metrics, and best practice recommendations. A written personalized care plan for preventive services as well as general preventive health recommendations were provided to patient.     Sharon Seller, NP   08/31/2023   After Visit Summary: (MyChart) Due to this being a telephonic visit, the after visit summary with patients personalized plan was offered to patient via MyChart

## 2023-08-31 NOTE — Progress Notes (Signed)
   This service is provided via telemedicine  No vital signs collected/recorded due to the encounter was a telemedicine visit.   Location of patient (ex: home, work):  Home  Patient consents to a telephone visit: Yes  Location of the provider (ex: office, home):  [employer], [place] Location   Name of any referring provider:  N/A  Names of all persons participating in the telemedicine service and their role in the encounter:  S.Chrae B/CMA, Abbey Chatters, NP, Tanya(Friend), and Patient   Time spent on call:  8 min with medical assistant

## 2023-09-08 ENCOUNTER — Other Ambulatory Visit: Payer: Self-pay | Admitting: Nurse Practitioner

## 2023-09-08 DIAGNOSIS — G119 Hereditary ataxia, unspecified: Secondary | ICD-10-CM

## 2023-09-08 NOTE — Telephone Encounter (Signed)
 Pharmacy requested refill.  Pended Rx and sent to [facility] for approval.

## 2023-10-11 ENCOUNTER — Ambulatory Visit: Payer: Medicare Other | Admitting: Nurse Practitioner

## 2023-10-19 ENCOUNTER — Other Ambulatory Visit: Payer: Self-pay | Admitting: *Deleted

## 2023-10-19 DIAGNOSIS — I739 Peripheral vascular disease, unspecified: Secondary | ICD-10-CM

## 2023-10-19 DIAGNOSIS — I7102 Dissection of abdominal aorta: Secondary | ICD-10-CM

## 2023-10-28 ENCOUNTER — Ambulatory Visit (HOSPITAL_COMMUNITY)
Admission: RE | Admit: 2023-10-28 | Discharge: 2023-10-28 | Disposition: A | Discharge status: Home / Self Care | Source: Ambulatory Visit | Attending: Vascular Surgery | Admitting: Vascular Surgery

## 2023-10-28 ENCOUNTER — Ambulatory Visit: Attending: Vascular Surgery | Admitting: Physician Assistant

## 2023-10-28 ENCOUNTER — Encounter: Payer: Self-pay | Admitting: Physician Assistant

## 2023-10-28 VITALS — BP 134/76 | HR 60 | Temp 98.3°F | Ht 67.0 in | Wt 177.0 lb

## 2023-10-28 DIAGNOSIS — I739 Peripheral vascular disease, unspecified: Secondary | ICD-10-CM | POA: Diagnosis not present

## 2023-10-28 DIAGNOSIS — I7102 Dissection of abdominal aorta: Secondary | ICD-10-CM

## 2023-10-28 LAB — VAS US ABI WITH/WO TBI
Left ABI: 1.01
Right ABI: 0.8

## 2023-10-28 NOTE — Progress Notes (Signed)
 Office Note     CC:  follow up Requesting Provider:  Verma Gobble, NP  HPI: Justin [name] is a 83 y.o. ([birth year]) male who presents for surveillance follow up of PAD and small aortic dissection.  He was seen last May at which time he was having some swelling in his legs.This was improved with elevation and compression stockings. Otherwise no definitive claudication symptoms or rest pain. 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 He has no history of tissue loss.   Today he presents with his caretaker. He reports overall doing well. He has been having intermittent pain in his left lower leg. Does not last long. He also has been having some coldness in his feet/toes. He just [place] being [place] the beach with [place] and while there he stubbed a couple toes on his right foot. These however bruised are healing. He continues to have some swelling in his legs but he does elevate. He has been trying to walk more with his walker. He is mostly in wheel chair if needing to go out of the house. He says he has been ambulating the halls in his home 5-6 x every morning, about 70 ft each direction. Also has been doing exercises every morning. 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. He reports no pain on ambulation. No tissue loss. He is medically managed on Aspirin  and statin. He is a non smoker.   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   Diaphragmatic hernia without mention of obstruction or gangrene 1982   Hiatal hernia   Diplopia 2006   Disturbance of skin sensation 2003   Diverticulosis of colon (without mention of hemorrhage) 1997    Dizziness and giddiness 2001   Dysphagia, unspecified(787.20) 1999 and   Esophagitis, unspecified 1997   GERD (gastroesophageal reflux disease) 2003   Hypertension 2003   Impotence of organic origin 1999   Lack of coordination September 07, 2007   Lumbago 2003   Migraine with aura, without mention of intractable migraine without mention of status migrainosus 1997   Myalgia and myositis, unspecified 1999   Other and unspecified hyperlipidemia 2003   Other disorders of vitreous 2003   Other malaise and fatigue 2003   Other seborrheic keratosis 2013   Other seborrheic keratosis 04/13/09   Pain in joint, site unspecified 04/13/12   Personal history of fall 04/15/11   Restless legs syndrome (RLS) 1997   Spinocerebellar disease, 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, 2008   Unspecified hearing loss 2003   Unspecified tinnitus 04/15/11    Past Surgical History:  Procedure Laterality Date   APPENDECTOMY  Age 48   boil removed     Dr. Delane Fear   C3-4 anterior cervical surgery  1999   Dr. Selestino Dakin   CARDIAC CATHETERIZATION  2001   Dr. Azalea Lento   COLONOSCOPY  07/20/07   Dr. Adan Holms   EYE SURGERY Bilateral 2015   cataracts   LAMINOTOMY / EXCISION DISK POSTERIOR CERVICAL SPINE     Spinal Disk (?4-5)    Social History   Socioeconomic History   Marital status:  Widowed    Spouse name: Not on file   Number of children: 2   Years of education: Not on file   Highest education level: Not on file  Occupational History   Occupation: Real Estate  Tobacco Use   Smoking status: Never   Smokeless tobacco: Former    Types: Chew    Quit date: 06/02/1979   Tobacco comments:    Stopped Chew about 1981  Vaping Use   Vaping status: Never Used  Substance and Sexual Activity   Alcohol use: Not Currently   Drug use: No   Sexual activity: Yes  Other Topics Concern   Not on file  Social History Narrative   Right handed   Drinks caffeine   One story home   Social Drivers of Health   Financial Resource  Strain: Low Risk  (04/04/2018)   Overall Financial Resource Strain (CARDIA)    Difficulty of Paying Living Expenses: Not hard at all  Food Insecurity: No Food Insecurity (04/04/2018)   Hunger Vital Sign    Worried About Running Out of Food in the Last Year: Never true    Ran Out of Food in the Last Year: Never true  Transportation Needs: No Transportation Needs (04/04/2018)   PRAPARE - Administrator, Civil Service (Medical): No    Lack of Transportation (Non-Medical): No  Physical Activity: Insufficiently Active (04/04/2018)   Exercise Vital Sign    Days of Exercise per Week: 3 days    Minutes of Exercise per Session: 30 min  Stress: No Stress Concern Present (04/04/2018)   [employer] of Occupational Health - Occupational Stress Questionnaire    Feeling of Stress : Not at all  Social Connections: Somewhat Isolated (04/04/2018)   Social Connection and Isolation Panel [NHANES]    Frequency of Communication with Friends and Family: More than three times a week    Frequency of Social Gatherings with Friends and Family: More than three times a week    Attends Religious Services: Never    Database administrator or Organizations: No    Attends Banker Meetings: Never    Marital Status: Married  Catering manager Violence: Not At Risk (04/04/2018)   Humiliation, Afraid, Rape, and Kick questionnaire    Fear of Current or Ex-Partner: No    Emotionally Abused: No    Physically Abused: No    Sexually Abused: No    Family History  Problem Relation Age of Onset   Heart disease Father        SCA with multiple family members with SCA   Diabetes Neg Hx    Colon cancer Neg Hx    Colon polyps Neg Hx    Esophageal cancer Neg Hx    Kidney disease Neg Hx    Gallbladder disease Neg Hx     Current Outpatient Medications  Medication Sig Dispense Refill   acetaminophen  (TYLENOL  8 HOUR ARTHRITIS PAIN) 650 MG CR tablet Take 1 tablet (650 mg total) by mouth 2 (two) times  daily. 60 tablet 3   amLODipine  (NORVASC ) 5 MG tablet TAKE 1 TABLET BY MOUTH EVERY DAY 90 tablet 1   atorvastatin  (LIPITOR) 10 MG tablet TAKE 1 TABLET BY MOUTH EVERY DAY 90 tablet 1   hydrochlorothiazide  (HYDRODIURIL ) 12.5 MG tablet TAKE 1 TABLET BY MOUTH EVERY DAY 90 tablet 3   losartan  (COZAAR ) 100 MG tablet Take 1 tablet (100 mg total) by mouth daily. 180 tablet 2   MYRBETRIQ  25 MG TB24  tablet TAKE 1 TABLET (25 MG TOTAL) BY MOUTH DAILY. 90 tablet 1   pantoprazole  (PROTONIX ) 40 MG tablet TAKE 1 TABLET BY MOUTH EVERY DAY 90 tablet 1   potassium chloride  (KLOR-CON ) 10 MEQ tablet TAKE 1 TABLET BY MOUTH EVERY DAY 90 tablet 3   pregabalin  (LYRICA ) 25 MG capsule TAKE 1 CAPSULE BY MOUTH THREE TIMES A DAY 270 capsule 1   Probiotic Product (PROBIOTIC DAILY PO) Take 1 tablet by mouth daily.     propranolol  (INDERAL ) 40 MG tablet TAKE 1 TABLET BY MOUTH TWICE A DAY 180 tablet 1   UNABLE TO FIND Take 1 tablet by mouth 2 (two) times daily. Med Name: Super Beta Prostate     No current facility-administered medications for this visit.    Allergies  Allergen Reactions   Effexor [Venlafaxine Hcl]    Serzone [Nefazodone]    Vivactil [Protriptyline Hcl]      REVIEW OF SYSTEMS:  [X]  denotes positive finding, [ ]  denotes negative finding Cardiac  Comments:  Chest pain or chest pressure:    Shortness of breath upon exertion:    Short of breath when lying flat:    Irregular heart rhythm:        Vascular    Pain in calf, thigh, or hip brought on by ambulation:    Pain in feet at night that wakes you up from your sleep:     Blood clot in your veins:    Leg swelling:  X       Pulmonary    Oxygen at home:    Productive cough:     Wheezing:         Neurologic    Sudden weakness in arms or legs:     Sudden numbness in arms or legs:     Sudden onset of difficulty speaking or slurred speech:    Temporary loss of vision in one eye:     Problems with dizziness:         Gastrointestinal    Blood in  stool:     Vomited blood:         Genitourinary    Burning when urinating:     Blood in urine:        Psychiatric    Major depression:         Hematologic    Bleeding problems:    Problems with blood clotting too easily:        Skin    Rashes or ulcers:        Constitutional    Fever or chills:      PHYSICAL EXAMINATION:  There were no vitals filed for this visit.  General:  WDWN in NAD; vital signs documented above Gait: Not observed HENT: WNL, normocephalic Pulmonary: normal non-labored breathing Cardiac: regular HR Abdomen: soft Vascular Exam/Pulses: 2+ femoral pulse, Left DP pulse palpable, unable to palpable right DP.  Extremities: without ischemic changes, without Gangrene , without cellulitis; without open wounds;  Musculoskeletal: no muscle wasting or atrophy  Neurologic: A&O X 3 Psychiatric:  The pt has Normal affect.   Non-Invasive Vascular Imaging:   +-------+-----------+-----------+------------+------------+  ABI/TBIToday's ABIToday's TBIPrevious ABIPrevious TBI  +-------+-----------+-----------+------------+------------+  Right 0.8        0.59       0.75        0.44          +-------+-----------+-----------+------------+------------+  Left  1.01       0.63       1.01  0.58          +-------+-----------+-----------+------------+------------+   ASSESSMENT/PLAN:: 83 y.o. male here for follow up for PAD and small aortic dissection. Dissection has been medically managed. He is without claudication, rest pain, or tissue loss. He has been improving his walking tolerance. He has some intermittent leg pains but I do not think this is related to his vascular disease. He also has had some coldness in his feet and toes. Provided reassurance that this is expected with smaller vessels in the feet especially when exposed to cold. Encourage him to keep his feet warm and protected.   - continue to mobilize as tolerated to help promote collateral  development - Continue Aspirin  and statin  - Follow up in 1 year with ABI   Deneen Finical, PA-C [facility] [phone]  Clinic MD:  Rosalva Comber

## 2023-11-01 ENCOUNTER — Other Ambulatory Visit: Payer: Self-pay | Admitting: Nurse Practitioner

## 2023-11-01 DIAGNOSIS — K219 Gastro-esophageal reflux disease without esophagitis: Secondary | ICD-10-CM

## 2023-12-20 ENCOUNTER — Other Ambulatory Visit: Payer: Self-pay | Admitting: Nurse Practitioner

## 2023-12-20 DIAGNOSIS — R609 Edema, unspecified: Secondary | ICD-10-CM

## 2023-12-20 DIAGNOSIS — G119 Hereditary ataxia, unspecified: Secondary | ICD-10-CM

## 2023-12-20 DIAGNOSIS — I10 Essential (primary) hypertension: Secondary | ICD-10-CM

## 2023-12-20 DIAGNOSIS — E782 Mixed hyperlipidemia: Secondary | ICD-10-CM

## 2023-12-20 DIAGNOSIS — R270 Ataxia, unspecified: Secondary | ICD-10-CM

## 2023-12-22 ENCOUNTER — Other Ambulatory Visit: Payer: Self-pay | Admitting: Nurse Practitioner

## 2024-01-12 DIAGNOSIS — I739 Peripheral vascular disease, unspecified: Secondary | ICD-10-CM | POA: Diagnosis not present

## 2024-01-25 NOTE — Progress Notes (Signed)
 CARDIOLOGY CONSULT NOTE       Patient ID: Justin [name] MRN: [id number] DOB/AGE: 1941-01-21 83 y.o.  Admit date: (Not on file) Referring Physician: Caro Primary Physician: Caro Harlene POUR, NP Primary Cardiologist: None Reason for Consultation: PAD/Vascular dx  Active Problems:   * No active hospital problems. *   HPI:  83 y.o. referred by primary for PVD. He has spinocerebellar dx and does not walk very much Sees [facility] with ABI's 10/28/23 0.8 on left and normal on right. No rest pain, gangrene or skin break down. Has some LE edema. CTA 11/15/16 with small intimal dissection in distal aorta not needing intervention Takes norvasc , losartan  and inderal  for HTN.  Dissection stable by US  2024 with no aneurysm. Echo in 04/19/20 with normal EF and trivial AR/MR Had normal myovue in 2018 with no ischemia/infarct. Has not had recent w/u for CAD. LDL is 62 on lipitor Triglycerides high but tricor  gave him constipation and he stopped it  Caregiver of 10 years with him today. Doing great. 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   Diaphragmatic hernia without mention of obstruction or gangrene 1982   Hiatal hernia   Diplopia 2006   Disturbance of skin sensation 2003   Diverticulosis of colon (without mention of hemorrhage) 1997   Dizziness and giddiness 2001   Dysphagia, unspecified(787.20) 1999 and   Esophagitis, unspecified 1997   GERD (gastroesophageal reflux disease)  2003   Hypertension 2003   Impotence of organic origin 1999   Lack of coordination September 07, 2007   Lumbago 2003   Migraine with aura, without mention of intractable migraine without mention of status migrainosus 1997   Myalgia and myositis, unspecified 1999   Other and unspecified hyperlipidemia 2003   Other disorders of vitreous 2003   Other malaise and fatigue 2003   Other seborrheic keratosis 2013   Other seborrheic keratosis 04/13/09   Pain in joint, site unspecified 04/13/12   Personal history of fall 04/15/11   Restless legs syndrome (RLS) 1997   Spinocerebellar disease, 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, 2008   Unspecified hearing loss 2003   Unspecified tinnitus 04/15/11    Family History  Problem Relation Age of Onset   Heart disease Father        SCA with multiple family members with SCA   Diabetes Neg Hx    Colon cancer Neg Hx    Colon polyps Neg Hx    Esophageal cancer Neg Hx    Kidney disease Neg Hx    Gallbladder disease Neg Hx     Social History   Socioeconomic History   Marital status: Widowed    Spouse name: Not on file   Number of children: 2   Years of education: Not on file   Highest education level: Not on file  Occupational History   Occupation: Real Estate  Tobacco Use   Smoking status: Never   Smokeless tobacco: Former  Types: [place]    Quit date: 06/02/1979   Tobacco comments:    Stopped Chew about 1981  Vaping Use   Vaping status: Never Used  Substance and Sexual Activity   Alcohol use: Not Currently   Drug use: No   Sexual activity: Yes  Other Topics Concern   Not on file  Social History Narrative   Right handed   Drinks caffeine   One story home   Social Drivers of Health   Financial Resource Strain: Low Risk  (04/04/2018)   Overall Financial Resource Strain (CARDIA)    Difficulty of Paying Living Expenses: Not hard at all  Food Insecurity: No Food Insecurity (04/04/2018)   Hunger Vital Sign    Worried About Running Out of Food in the  Last Year: Never true    Ran Out of Food in the Last Year: Never true  Transportation Needs: No Transportation Needs (04/04/2018)   PRAPARE - Administrator, Civil Service (Medical): No    Lack of Transportation (Non-Medical): No  Physical Activity: Insufficiently Active (04/04/2018)   Exercise Vital Sign    Days of Exercise per Week: 3 days    Minutes of Exercise per Session: 30 min  Stress: No Stress Concern Present (04/04/2018)   [employer] of Occupational Health - Occupational Stress Questionnaire    Feeling of Stress : Not at all  Social Connections: Somewhat Isolated (04/04/2018)   Social Connection and Isolation Panel    Frequency of Communication with Friends and Family: More than three times a week    Frequency of Social Gatherings with Friends and Family: More than three times a week    Attends Religious Services: Never    Database administrator or Organizations: No    Attends Banker Meetings: Never    Marital Status: Married  Catering manager Violence: Not At Risk (04/04/2018)   Humiliation, Afraid, Rape, and Kick questionnaire    Fear of Current or Ex-Partner: No    Emotionally Abused: No    Physically Abused: No    Sexually Abused: No    Past Surgical History:  Procedure Laterality Date   APPENDECTOMY  Age 72   boil removed     Dr. Ebbie   C3-4 anterior cervical surgery  1999   Dr. Carles   CARDIAC CATHETERIZATION  2001   Dr. Bulah   COLONOSCOPY  07/20/07   Dr. Jakie   EYE SURGERY Bilateral 2015   cataracts   LAMINOTOMY / EXCISION DISK POSTERIOR CERVICAL SPINE     Spinal Disk (?4-5)      Current Outpatient Medications:    acetaminophen  (TYLENOL  8 HOUR ARTHRITIS PAIN) 650 MG CR tablet, Take 1 tablet (650 mg total) by mouth 2 (two) times daily., Disp: 60 tablet, Rfl: 3   amLODipine  (NORVASC ) 5 MG tablet, TAKE 1 TABLET BY MOUTH EVERY DAY, Disp: 90 tablet, Rfl: 1   atorvastatin  (LIPITOR) 10 MG tablet, TAKE 1 TABLET BY  MOUTH EVERY DAY, Disp: 90 tablet, Rfl: 1   hydrochlorothiazide  (HYDRODIURIL ) 12.5 MG tablet, TAKE 1 TABLET BY MOUTH EVERY DAY, Disp: 90 tablet, Rfl: 3   losartan  (COZAAR ) 100 MG tablet, Take 1 tablet (100 mg total) by mouth daily., Disp: 180 tablet, Rfl: 2   MYRBETRIQ  25 MG TB24 tablet, TAKE 1 TABLET (25 MG TOTAL) BY MOUTH DAILY., Disp: 90 tablet, Rfl: 1   pantoprazole  (PROTONIX ) 40 MG tablet, TAKE 1 TABLET BY MOUTH EVERY DAY, Disp: 90 tablet, Rfl: 1   potassium  chloride (KLOR-CON ) 10 MEQ tablet, TAKE 1 TABLET BY MOUTH EVERY DAY, Disp: 90 tablet, Rfl: 3   pregabalin  (LYRICA ) 25 MG capsule, TAKE 1 CAPSULE BY MOUTH THREE TIMES A DAY, Disp: 270 capsule, Rfl: 1   Probiotic Product (PROBIOTIC DAILY PO), Take 1 tablet by mouth daily., Disp: , Rfl:    propranolol  (INDERAL ) 40 MG tablet, TAKE 1 TABLET BY MOUTH TWICE A DAY, Disp: 180 tablet, Rfl: 1   UNABLE TO FIND, Take 1 tablet by mouth 2 (two) times daily. Med Name: Super Beta Prostate, Disp: , Rfl:     Physical Exam: Blood pressure 120/60, pulse 69, height 5' 7 (1.702 m), weight 174 lb (78.9 kg), SpO2 96%.    Affect appropriate Healthy:  appears stated age HEENT: Speech difficult to understand  Neck supple with no adenopathy JVP normal no bruits no thyromegaly Lungs clear with no wheezing and good diaphragmatic motion Heart:  S1/S2 no murmur, no rub, gallop or click PMI normal Abdomen: benighn, BS positve, no tenderness, no AAA no bruit.  No HSM or HJR Distal pulses intact with no bruits No edema Abnormal gait  Ataxia   Labs:   Lab Results  Component Value Date   WBC 9.0 04/16/2023   HGB 14.5 04/16/2023   HCT 43.3 04/16/2023   MCV 90.4 04/16/2023   PLT 251 04/16/2023   No results for input(s): NA, K, CL, CO2, BUN, CREATININE, CALCIUM , PROT, BILITOT, ALKPHOS, ALT, AST, GLUCOSE in the last 168 hours.  Invalid input(s): LABALBU Lab Results  Component Value Date   TROPONINI <0.03 11/26/2016    Lab  Results  Component Value Date   CHOL 135 06/09/2023   CHOL 217 (H) 04/16/2023   CHOL 174 07/08/2022   Lab Results  Component Value Date   HDL 40 06/09/2023   HDL 43 04/16/2023   HDL 44 07/08/2022   Lab Results  Component Value Date   LDLCALC 62 06/09/2023   LDLCALC 120 (H) 04/16/2023   LDLCALC 90 07/08/2022   Lab Results  Component Value Date   TRIG 279 (H) 06/09/2023   TRIG 378 (H) 04/16/2023   TRIG 278 (H) 07/08/2022   Lab Results  Component Value Date   CHOLHDL 3.4 06/09/2023   CHOLHDL 5.0 (H) 04/16/2023   CHOLHDL 4.0 07/08/2022   No results found for: LDLDIRECT    Radiology: No results found.  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

## 2024-02-01 ENCOUNTER — Encounter (HOSPITAL_BASED_OUTPATIENT_CLINIC_OR_DEPARTMENT_OTHER): Payer: Self-pay

## 2024-02-03 ENCOUNTER — Ambulatory Visit: Attending: Cardiovascular Disease | Admitting: Cardiovascular Disease

## 2024-02-03 ENCOUNTER — Encounter: Payer: Self-pay | Admitting: Cardiovascular Disease

## 2024-02-03 VITALS — BP 120/60 | HR 69 | Ht 67.0 in | Wt 174.0 lb

## 2024-02-03 DIAGNOSIS — I7102 Dissection of abdominal aorta: Secondary | ICD-10-CM

## 2024-02-03 DIAGNOSIS — I739 Peripheral vascular disease, unspecified: Secondary | ICD-10-CM | POA: Diagnosis not present

## 2024-02-03 NOTE — Patient Instructions (Signed)
 Medication Instructions:  Your physician recommends that you continue on your current medications as directed. Please refer to the Current Medication list given to you today.   *If you need a refill on your cardiac medications before your next appointment, please call your pharmacy*  Lab Work: If you have labs (blood work) drawn today and your tests are completely normal, you will receive your results only by: MyChart Message (if you have MyChart) OR A paper copy in the mail If you have any lab test that is abnormal or we need to change your treatment, we will call you to review the results.  Testing/Procedures: None ordered today.  Follow-Up: At [facility], you and your health needs are our priority.  As part of our continuing mission to provide you with exceptional heart care, our providers are all part of one team.  This team includes your primary Cardiologist (physician) and Advanced Practice Providers or APPs (Physician Assistants and Nurse Practitioners) who all work together to provide you with the care you need, when you need it.  Your next appointment:   1 year(s)  Provider:   Janelle Mediate, MD    We recommend signing up for the patient portal called "MyChart".  Sign up information is provided on this After Visit Summary.  MyChart is used to connect with patients for Virtual Visits (Telemedicine).  Patients are able to view lab/test results, encounter notes, upcoming appointments, etc.  Non-urgent messages can be sent to your provider as well.   To learn more about what you can do with MyChart, go to [email or web address].

## 2024-03-07 ENCOUNTER — Encounter (HOSPITAL_BASED_OUTPATIENT_CLINIC_OR_DEPARTMENT_OTHER): Payer: Self-pay

## 2024-03-07 ENCOUNTER — Observation Stay (HOSPITAL_BASED_OUTPATIENT_CLINIC_OR_DEPARTMENT_OTHER)
Admission: EM | Admit: 2024-03-07 | Discharge: 2024-03-09 | Disposition: A | Discharge status: Home / Self Care | Attending: Internal Medicine | Admitting: Internal Medicine

## 2024-03-07 ENCOUNTER — Other Ambulatory Visit: Payer: Self-pay

## 2024-03-07 ENCOUNTER — Emergency Department (HOSPITAL_BASED_OUTPATIENT_CLINIC_OR_DEPARTMENT_OTHER)

## 2024-03-07 DIAGNOSIS — G1119 Other early-onset cerebellar ataxia: Secondary | ICD-10-CM | POA: Insufficient documentation

## 2024-03-07 DIAGNOSIS — I10 Essential (primary) hypertension: Secondary | ICD-10-CM | POA: Insufficient documentation

## 2024-03-07 DIAGNOSIS — Z743 Need for continuous supervision: Secondary | ICD-10-CM | POA: Diagnosis not present

## 2024-03-07 DIAGNOSIS — G118 Other hereditary ataxias: Secondary | ICD-10-CM

## 2024-03-07 DIAGNOSIS — Z794 Long term (current) use of insulin: Secondary | ICD-10-CM | POA: Insufficient documentation

## 2024-03-07 DIAGNOSIS — I739 Peripheral vascular disease, unspecified: Secondary | ICD-10-CM | POA: Insufficient documentation

## 2024-03-07 DIAGNOSIS — G459 Transient cerebral ischemic attack, unspecified: Principal | ICD-10-CM

## 2024-03-07 DIAGNOSIS — Z7982 Long term (current) use of aspirin: Secondary | ICD-10-CM | POA: Insufficient documentation

## 2024-03-07 DIAGNOSIS — R29818 Other symptoms and signs involving the nervous system: Secondary | ICD-10-CM | POA: Diagnosis not present

## 2024-03-07 DIAGNOSIS — G119 Hereditary ataxia, unspecified: Secondary | ICD-10-CM | POA: Diagnosis not present

## 2024-03-07 DIAGNOSIS — I7102 Dissection of abdominal aorta: Secondary | ICD-10-CM | POA: Diagnosis not present

## 2024-03-07 DIAGNOSIS — R29810 Facial weakness: Secondary | ICD-10-CM | POA: Diagnosis not present

## 2024-03-07 DIAGNOSIS — E119 Type 2 diabetes mellitus without complications: Secondary | ICD-10-CM

## 2024-03-07 DIAGNOSIS — Z79899 Other long term (current) drug therapy: Secondary | ICD-10-CM | POA: Diagnosis not present

## 2024-03-07 DIAGNOSIS — I6782 Cerebral ischemia: Secondary | ICD-10-CM | POA: Diagnosis not present

## 2024-03-07 DIAGNOSIS — G8194 Hemiplegia, unspecified affecting left nondominant side: Secondary | ICD-10-CM

## 2024-03-07 DIAGNOSIS — QA00102 CACNA1A-related neurodevelopmental disorder: Secondary | ICD-10-CM

## 2024-03-07 DIAGNOSIS — R471 Dysarthria and anarthria: Secondary | ICD-10-CM | POA: Diagnosis not present

## 2024-03-07 DIAGNOSIS — R4781 Slurred speech: Secondary | ICD-10-CM | POA: Diagnosis not present

## 2024-03-07 DIAGNOSIS — R531 Weakness: Secondary | ICD-10-CM | POA: Diagnosis not present

## 2024-03-07 DIAGNOSIS — I6389 Other cerebral infarction: Principal | ICD-10-CM | POA: Insufficient documentation

## 2024-03-07 LAB — COMPREHENSIVE METABOLIC PANEL WITH GFR
ALT: 44 U/L (ref 0–44)
AST: 27 U/L (ref 15–41)
Albumin: 4.4 g/dL (ref 3.5–5.0)
Alkaline Phosphatase: 137 U/L — ABNORMAL HIGH (ref 38–126)
Anion gap: 13 (ref 5–15)
BUN: 18 mg/dL (ref 8–23)
CO2: 24 mmol/L (ref 22–32)
Calcium: 10 mg/dL (ref 8.9–10.3)
Chloride: 102 mmol/L (ref 98–111)
Creatinine, Ser: 1.16 mg/dL (ref 0.61–1.24)
GFR, Estimated: >60 mL/min (ref >60)
Glucose, Bld: 221 mg/dL — ABNORMAL HIGH (ref 70–99)
Potassium: 4.4 mmol/L (ref 3.5–5.1)
Sodium: 138 mmol/L (ref 135–145)
Total Bilirubin: 0.5 mg/dL (ref 0.0–1.2)
Total Protein: 7.1 g/dL (ref 6.5–8.1)

## 2024-03-07 LAB — CBC
HCT: 44.9 % (ref 39.0–52.0)
Hemoglobin: 15.1 g/dL (ref 13.0–17.0)
MCH: 30.4 pg (ref 26.0–34.0)
MCHC: 33.6 g/dL (ref 30.0–36.0)
MCV: 90.5 fL (ref 80.0–100.0)
Platelets: 244 K/uL (ref 150–400)
RBC: 4.96 MIL/uL (ref 4.22–5.81)
RDW: 13.3 % (ref 11.5–15.5)
WBC: 11.6 K/uL — ABNORMAL HIGH (ref 4.0–10.5)
nRBC: 0 % (ref 0.0–0.2)

## 2024-03-07 LAB — PROTIME-INR
INR: 0.9 (ref 0.8–1.2)
Prothrombin Time: 12.9 s (ref 11.4–15.2)

## 2024-03-07 LAB — DIFFERENTIAL
Abs Immature Granulocytes: 0.05 K/uL (ref 0.00–0.07)
Basophils Absolute: 0.1 K/uL (ref 0.0–0.1)
Basophils Relative: 1 %
Eosinophils Absolute: 0.1 K/uL (ref 0.0–0.5)
Eosinophils Relative: 1 %
Immature Granulocytes: 0 %
Lymphocytes Relative: 39 %
Lymphs Abs: 4.5 K/uL — ABNORMAL HIGH (ref 0.7–4.0)
Monocytes Absolute: 0.8 K/uL (ref 0.1–1.0)
Monocytes Relative: 7 %
Neutro Abs: 6.1 K/uL (ref 1.7–7.7)
Neutrophils Relative %: 52 %

## 2024-03-07 LAB — APTT: aPTT: 27 s (ref 24–36)

## 2024-03-07 LAB — GLUCOSE, CAPILLARY: Glucose-Capillary: 122 mg/dL — ABNORMAL HIGH (ref 70–99)

## 2024-03-07 LAB — CBG MONITORING, ED: Glucose-Capillary: 217 mg/dL — ABNORMAL HIGH (ref 70–99)

## 2024-03-07 LAB — ETHANOL: Alcohol, Ethyl (B): <15 mg/dL (ref <15)

## 2024-03-07 MED ORDER — HYDROCHLOROTHIAZIDE 12.5 MG PO TABS
12.5000 mg | ORAL_TABLET | Freq: Every day | ORAL | Status: DC
Start: 2024-03-07 — End: 2024-03-07
  Administered 2024-03-07: 12.5 mg via ORAL
  Filled 2024-03-07: qty 1

## 2024-03-07 MED ORDER — SENNOSIDES-DOCUSATE SODIUM 8.6-50 MG PO TABS: 1.0000 | ORAL_TABLET | Freq: Every evening | ORAL | Status: DC | PRN

## 2024-03-07 MED ORDER — INSULIN ASPART 100 UNIT/ML IJ SOLN
0.0000 [IU] | Freq: Three times a day (TID) | INTRAMUSCULAR | Status: DC
  Administered 2024-03-08 – 2024-03-09 (×3): 1 [IU] via SUBCUTANEOUS

## 2024-03-07 MED ORDER — ACETAMINOPHEN 160 MG/5ML PO SOLN: 650.0000 mg | ORAL | Status: DC | PRN

## 2024-03-07 MED ORDER — ENOXAPARIN SODIUM 40 MG/0.4ML IJ SOSY
40.0000 mg | PREFILLED_SYRINGE | INTRAMUSCULAR | Status: DC
  Administered 2024-03-08 (×2): 40 mg via SUBCUTANEOUS
  Filled 2024-03-07 (×2): qty 0.4

## 2024-03-07 MED ORDER — ASPIRIN 81 MG PO TBEC
81.0000 mg | DELAYED_RELEASE_TABLET | Freq: Every day | ORAL | Status: DC
  Administered 2024-03-08 – 2024-03-09 (×2): 81 mg via ORAL
  Filled 2024-03-07 (×2): qty 1

## 2024-03-07 MED ORDER — PREGABALIN 25 MG PO CAPS
25.0000 mg | ORAL_CAPSULE | Freq: Three times a day (TID) | ORAL | Status: DC
  Administered 2024-03-08 – 2024-03-09 (×5): 25 mg via ORAL
  Filled 2024-03-07 (×5): qty 1

## 2024-03-07 MED ORDER — IOHEXOL 350 MG/ML SOLN
100.0000 mL | Freq: Once | INTRAVENOUS | Status: AC | PRN
  Administered 2024-03-07: 75 mL via INTRAVENOUS

## 2024-03-07 MED ORDER — SODIUM CHLORIDE 0.9 % IV SOLN: INTRAVENOUS | Status: DC

## 2024-03-07 MED ORDER — AMLODIPINE BESYLATE 5 MG PO TABS
5.0000 mg | ORAL_TABLET | Freq: Every day | ORAL | Status: DC
Start: 2024-03-07 — End: 2024-03-07
  Administered 2024-03-07: 5 mg via ORAL
  Filled 2024-03-07: qty 1

## 2024-03-07 MED ORDER — INSULIN ASPART 100 UNIT/ML IJ SOLN: 0.0000 [IU] | Freq: Every day | INTRAMUSCULAR | Status: DC

## 2024-03-07 MED ORDER — ACETAMINOPHEN 650 MG RE SUPP: 650.0000 mg | RECTAL | Status: DC | PRN

## 2024-03-07 MED ORDER — SODIUM CHLORIDE 0.9% FLUSH
3.0000 mL | Freq: Once | INTRAVENOUS | Status: AC
  Administered 2024-03-07: 3 mL via INTRAVENOUS
  Filled 2024-03-07: qty 3

## 2024-03-07 MED ORDER — STROKE: EARLY STAGES OF RECOVERY BOOK
Freq: Once | Status: AC
  Filled 2024-03-07: qty 1

## 2024-03-07 MED ORDER — ACETAMINOPHEN 325 MG PO TABS
650.0000 mg | ORAL_TABLET | ORAL | Status: DC | PRN
  Administered 2024-03-08 (×2): 650 mg via ORAL
  Filled 2024-03-07 (×2): qty 2

## 2024-03-07 MED ORDER — PANTOPRAZOLE SODIUM 40 MG PO TBEC
40.0000 mg | DELAYED_RELEASE_TABLET | Freq: Every day | ORAL | Status: DC
  Administered 2024-03-08 – 2024-03-09 (×2): 40 mg via ORAL
  Filled 2024-03-07 (×2): qty 1

## 2024-03-07 MED ORDER — ATORVASTATIN CALCIUM 10 MG PO TABS
10.0000 mg | ORAL_TABLET | Freq: Every day | ORAL | Status: DC
  Administered 2024-03-07 – 2024-03-09 (×3): 10 mg via ORAL
  Filled 2024-03-07 (×3): qty 1

## 2024-03-07 NOTE — ED Provider Notes (Signed)
 Emergency Department Provider Note   I have reviewed the triage vital signs and the nursing notes.   HISTORY  Chief Complaint Code Stroke   HPI Justin [name] is a 83 y.o. male with past history reviewed below presents to the emergency department by private vehicle with his caregiver with acute onset speech disturbance and left-sided weakness.  Patient awoke this morning feeling normal and went about his morning exercise.  This was approximately 8 AM.  While exercising he was unable to move his left arm during exercise and came out to alert his caregiver who found him to be more dysarthric than his usual.  They did not immediately call 911.  He seemed to be gradually improving but not completely back to baseline and so they presented to the ED by private vehicle.  Patient is not anticoagulated.  Denies any numbness or vision changes. 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   Diaphragmatic hernia without mention of obstruction or gangrene 1982   Hiatal hernia   Diplopia 2006   Disturbance of skin sensation 2003   Diverticulosis of colon (without mention of hemorrhage) 1997   Dizziness and giddiness 2001   Dysphagia, unspecified(787.20) 1999 and   Esophagitis, unspecified 1997   GERD (gastroesophageal reflux disease) 2003   Hypertension 2003   Impotence of organic origin 1999   Lack of coordination September 07, 2007   Lumbago 2003   Migraine with aura, without mention of intractable migraine without mention of status migrainosus 1997   Myalgia and myositis, unspecified 1999   Other and unspecified hyperlipidemia 2003   Other disorders of vitreous  2003   Other malaise and fatigue 2003   Other seborrheic keratosis 2013   Other seborrheic keratosis 04/13/09   Pain in joint, site unspecified 04/13/12   Personal history of fall 04/15/11   Restless legs syndrome (RLS) 1997   Spinocerebellar disease, 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 Respiratory: Denies shortness of breath. Gastrointestinal: No abdominal pain.  No nausea, no vomiting.   Musculoskeletal: Negative for back pain. Skin: Negative for rash. Neurological: Negative for headaches. Positive left side weakness and speech disturbance.    ____________________________________________   PHYSICAL EXAM:  VITAL SIGNS: ED Triage Vitals  Encounter Vitals Group     BP 03/07/24 1018 (!) 164/74     Pulse Rate 03/07/24 1018 70     Resp 03/07/24 1018 18     Temp 03/07/24 1018 98.4 F (36.9 C)     Temp Source 03/07/24 1018 Oral     SpO2 03/07/24 1018 100 %     Weight 03/07/24 1035 180 lb 8.9 oz (81.9 kg)   Constitutional: Alert and oriented. Well appearing and in no acute distress. Eyes: Conjunctivae are normal. PERRL. EOMI. Head: Atraumatic. Nose: No congestion/rhinnorhea. Mouth/Throat: Mucous membranes are moist.  Neck: No stridor.   Cardiovascular: Normal rate, regular rhythm. Good peripheral circulation. Grossly normal heart sounds.   Respiratory: Normal respiratory effort.  No retractions. Lungs CTAB. Gastrointestinal:  Soft and nontender. No distention.  Musculoskeletal: No lower extremity tenderness nor edema. No gross deformities of extremities. Neurologic: Patient with significant dysarthria.  5/5 strength in the bilateral upper and lower extremities.  No pronator drift.  No appreciable facial asymmetry.  Skin:  Skin is warm, dry and intact. No rash noted.   ____________________________________________   LABS (all  labs ordered are listed, but only abnormal results are displayed)  Labs Reviewed  CBC - Abnormal; Notable for the following components:      Result Value   WBC 11.6 (*)    All other components within normal limits  DIFFERENTIAL - Abnormal; Notable for the following components:   Lymphs Abs 4.5 (*)    All other components within normal limits  COMPREHENSIVE METABOLIC PANEL WITH GFR - Abnormal; Notable for the following components:   Glucose, Bld 221 (*)    Alkaline Phosphatase 137 (*)    All other components within normal limits  CBG MONITORING, ED - Abnormal; Notable for the following components:   Glucose-Capillary 217 (*)    All other components within normal limits  PROTIME-INR  APTT  ETHANOL   ____________________________________________  EKG   EKG Interpretation Date/Time:  Tuesday March 07 2024 10:37:41 EDT Ventricular Rate:  72 PR Interval:  192 QRS Duration:  96 QT Interval:  401 QTC Calculation: 439 R Axis:   -10  Text Interpretation: Sinus rhythm Similar to Sep 4th tracing Confirmed by Darra Chew [phone]) on 03/07/2024 10:52:00 AM        ____________________________________________  RADIOLOGY  CT ANGIO HEAD NECK W WO CM (CODE STROKE) Result Date: 03/07/2024 CLINICAL DATA:  Left-sided weakness EXAM: CT ANGIOGRAPHY HEAD AND NECK WITH AND WITHOUT CONTRAST TECHNIQUE: Multidetector CT imaging of the head and neck was performed using the standard protocol during bolus administration of intravenous contrast. Multiplanar CT image reconstructions and MIPs were obtained to evaluate the vascular anatomy. Carotid stenosis measurements (when applicable) are obtained utilizing NASCET criteria, using the distal internal carotid diameter as the denominator. RADIATION DOSE REDUCTION: This exam was performed according to the departmental dose-optimization program which includes automated exposure control, adjustment of the mA and/or kV according to patient size and/or use of  iterative reconstruction technique. CONTRAST:  75mL OMNIPAQUE IOHEXOL 350 MG/ML SOLN COMPARISON:  None Available. CTA NECK: CTA NECK Aortic arch: No proximal vessel stenosis. Right carotid: There is a small amount of plaque at the bifurcation without significant stenosis Left carotid: Small amount of plaque at the bifurcation without significant stenosis Right vertebral: Normal Left vertebral: Normal Soft tissues: No significant abnormality Other comments: None CTA HEAD: CTA HEAD Right anterior circulation: The internal carotid artery is patent without significant stenosis. The anterior and middle cerebral arteries are patent without significant stenosis or proximal branch occlusion. No aneurysm. Left anterior circulation: The internal carotid artery is patent without significant stenosis. The anterior and middle cerebral arteries are patent without significant stenosis or proximal branch occlusion. No aneurysm. Posterior circulation: Both vertebral arteries are patent. 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 Both posterior cerebral arteries are patent. IMPRESSION: 1. No extracranial carotid artery stenosis 2. Severe mid basilar artery stenosis 3. No proximal vessel occlusion in the anterior circulation Electronically Signed   By: Nancyann Burns M.D.   On: 03/07/2024 11:52   CT HEAD CODE STROKE WO CONTRAST Result Date: 03/07/2024 CLINICAL DATA:  Code stroke. Provided history: Neuro deficit, acute, stroke suspected. Slurred speech. Left-sided weakness. EXAM: CT HEAD WITHOUT CONTRAST TECHNIQUE: Contiguous axial images were obtained  from the base of the skull through the vertex without intravenous contrast. RADIATION DOSE REDUCTION: This exam was performed according to the departmental dose-optimization program which includes automated exposure control, adjustment of the mA and/or kV according to patient size and/or use of iterative reconstruction technique. COMPARISON:  Brain MRI 09/03/2006. FINDINGS: Brain:  Generalized cerebral and cerebellar atrophy. Small age-indeterminate cortical infarct within the left occipital lobe (PCA vascular territory) (series 5, image 14). Patchy and ill-defined hypoattenuation within the cerebral white matter, nonspecific but compatible with mild chronic small vessel ischemic disease. Small chronic appearing lacunar infarct within the right lentiform nucleus. There is no acute intracranial hemorrhage. No extra-axial fluid collection. No evidence of an intracranial mass. No midline shift. Vascular: No hyperdense vessel.  Atherosclerotic calcifications. Skull: No calvarial fracture or aggressive osseous lesion. Sinuses/Orbits: No mass or acute finding within the imaged orbits. No significant paranasal sinus disease at the imaged levels. ASPECTS (Alberta Stroke Program Early CT Score) - Ganglionic level infarction (caudate, lentiform nuclei, internal capsule, insula, M1-M3 cortex): 7 - Supraganglionic infarction (M4-M6 cortex): 3 Total score (0-10 with 10 being normal): 10 (when discounting a chronic right basal ganglia lacunar infarct). These results were called by telephone at the time of interpretation on 03/07/2024 at 10:38 am to provider Rhyder Koegel , who verbally acknowledged these results. IMPRESSION: 1. Small age-indeterminate cortical infarct within the left occipital lobe (PCA vascular territory). A brain MRI may be obtained for further evaluation, as clinically warranted. 2. 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 3. Background parenchymal atrophy and mild cerebral white matter chronic small vessel ischemic disease. Electronically Signed   By: Rockey Childs D.O.   On: 03/07/2024 10:41    ____________________________________________   PROCEDURES  Procedure(s) performed:   Procedures  CRITICAL CARE Performed by: Fonda KANDICE Law Total critical care time: 35 minutes Critical care time was exclusive of separately billable procedures and treating other  patients. Critical care was necessary to treat or prevent imminent or life-threatening deterioration. Critical care was time spent personally by me on the following activities: development of treatment plan with patient and/or surrogate as well as nursing, discussions with consultants, evaluation of patient's response to treatment, examination of patient, obtaining history from patient or surrogate, ordering and performing treatments and interventions, ordering and review of laboratory studies, ordering and review of radiographic studies, pulse oximetry and re-evaluation of patient's condition.  Fonda Law, MD Emergency Medicine  ____________________________________________   INITIAL IMPRESSION / ASSESSMENT AND PLAN / ED COURSE  Pertinent labs & imaging results that were available during my care of the patient were reviewed by me and considered in my medical decision making (see chart for details).   This patient is Presenting for Evaluation of AMS, which does require a range of treatment options, and is a complaint that involves a high risk of morbidity and mortality.  The Differential Diagnoses includes but is not exclusive to alcohol, illicit or prescription medications, intracranial pathology such as stroke, intracerebral hemorrhage, fever or infectious causes including sepsis, hypoxemia, uremia, trauma, endocrine related disorders such as diabetes, hypoglycemia, thyroid-related diseases, etc.   Critical Interventions-    Medications  sodium chloride flush (NS) 0.9 % injection 3 mL (3 mLs Intravenous Given 03/07/24 1042)  iohexol (OMNIPAQUE) 350 MG/ML injection 100 mL (75 mLs Intravenous Contrast Given 03/07/24 1136)    Reassessment after intervention: symptoms have resolved.    I did obtain Additional Historical Information from caregiver at bedside.   Clinical Laboratory Tests Ordered, included CBC  without anemia.  No acute kidney injury.  INR normal.  Radiologic Tests Ordered,  included CT head w/o contrast. I independently interpreted the images and agree with radiology interpretation.   Cardiac Monitor Tracing which shows NSR.    Social Determinants of Health Risk patient is not an active smoker.   Consult complete with Neurology, Dr. Michaela. Neurology to consult inpatient for likely TIA.   TRH, Dr. Tobie. Plan for admit.   Medical Decision Making: Summary:  Patient arrives by private vehicle with new onset stroke symptoms.  Activated as a code stroke from triage.  Airway cleared and he was sent directly to CT.  Upon evaluation in the room he has notable dysarthria but no appreciable motor or sensory deficits on my assessment.  Apparently he did have some mild weakness on the left in triage.  In discussing with the patient's caregiver at bedside his speech, while dysarthric, is at his baseline.  Patient agrees with this assessment.  At this time no new or focal deficits.  Code stroke canceled.  Plan for CT angio head and neck with plan for TIA admit.   Reevaluation with update and discussion with patient and caregiver at bedside.  Updated on plan for admission/TIA workup.  They are in agreement.  Patient's presentation is most consistent with acute presentation with potential threat to life or bodily function.   Disposition: admit  ____________________________________________  FINAL CLINICAL IMPRESSION(S) / ED DIAGNOSES  Final diagnoses:  TIA (transient ischemic attack)     Note:  This document was prepared using Dragon voice recognition software and may include unintentional dictation errors.  Fonda Law, MD, [facility] Emergency Medicine    Romey Mathieson, Fonda MATSU, MD 03/10/24 [phone]

## 2024-03-07 NOTE — ED Triage Notes (Addendum)
 States woke up and then at 0800 started having left sided weakness, and facial droop with slurred speech. Talking out of one side of his mouth per caregiver. Slurred speech and left sided weakness noted, right facial droop. Not on thinners. Denies hx of stroke.  Code stroke activated, brought to CT from triage. Dr. Darra aware.

## 2024-03-07 NOTE — ED Notes (Signed)
 Pt transported to CT w primary RN and CT tech

## 2024-03-07 NOTE — Progress Notes (Signed)
 Call [facility] at [phone] re: Pt Justin [name], Justin [name] MRN [id number] at [facility]  Admit: Dysarthria and stroke admit.  Vitals:   03/07/24 1115 03/07/24 1145 03/07/24 1215 03/07/24 1230  BP: (!) 111/51 (!) 131/59 135/66 (!) 149/72  Pulse: 66 70 71 73  Temp:      Resp: 19 18 20 18   Weight:      SpO2: 95% 96% 95% 96%  TempSrc:       Vitals:   03/07/24 1018 03/07/24 1040 03/07/24 1100 03/07/24 1115  BP: (!) 164/74 135/72 (!) 131/59 (!) 111/51   03/07/24 1145 03/07/24 1215 03/07/24 1230  BP: (!) 131/59 135/66 (!) 149/72      Latest Ref Rng & Units 03/07/2024   10:35 AM 06/09/2023    9:09 AM 04/16/2023    9:18 AM  CMP  Glucose 70 - 99 mg/dL 778  874  864   BUN 8 - 23 mg/dL 18  15  17    Creatinine 0.61 - 1.24 mg/dL 8.83  9.05  9.04   Sodium 135 - 145 mmol/L 138  140  138   Potassium 3.5 - 5.1 mmol/L 4.4  4.5  4.6   Chloride 98 - 111 mmol/L 102  102  102   CO2 22 - 32 mmol/L 24  28  27    Calcium  8.9 - 10.3 mg/dL 89.9  89.9  89.9   Total Protein 6.5 - 8.1 g/dL 7.1  6.4  6.7   Total Bilirubin 0.0 - 1.2 mg/dL 0.5  0.4  0.5   Alkaline Phos 38 - 126 U/L 137     AST 15 - 41 U/L 27  16  18    ALT 0 - 44 U/L 44  19  30    Current Outpatient Medications  Medication Instructions   acetaminophen  (TYLENOL  8 HOUR ARTHRITIS PAIN) 650 mg, Oral, 2 times daily   amLODipine  (NORVASC ) 5 mg, Oral, Daily   atorvastatin  (LIPITOR) 10 mg, Oral, Daily   hydrochlorothiazide  (HYDRODIURIL ) 12.5 mg, Oral, Daily   losartan  (COZAAR ) 100 mg, Oral, Daily   Myrbetriq  25 mg, Oral, Daily   pantoprazole  (PROTONIX ) 40 mg, Oral, Daily   potassium chloride  (KLOR-CON ) 10 MEQ tablet 10 mEq, Oral, Daily   pregabalin  (LYRICA ) 25 MG capsule TAKE 1 CAPSULE BY MOUTH THREE TIMES A DAY   Probiotic Product (PROBIOTIC DAILY PO) 1 tablet, Daily   propranolol  (INDERAL ) 40 mg, Oral, 2 times daily   UNABLE TO FIND 1 tablet, 2 times daily     Nonfocal except for dysarthria and initial LUE weakness.   Pt accepted to med tele. Consult  neuro on arrival.

## 2024-03-07 NOTE — ED Notes (Signed)
 1019- teleneuro cart activated, technical difficulties, unable to see/hear provider 1020- In CT 1035- back in room, Dr. Darra at bedside 1039- Dr. Darra on phone with Dr. Michaela, speaking with patient about symptoms.

## 2024-03-07 NOTE — ED Notes (Signed)
 Patient in room, symptoms resolving. Grip strength equal. No longer facial droop noted. Speech remains slurred.

## 2024-03-07 NOTE — H&P (Signed)
 History and Physical    Justin [name] FMW:[id number] DOB: 1941-03-16 DOA: 03/07/2024  PCP: Justin Harlene POUR, NP   Patient coming from: Home   Chief Complaint: Left arm weakness, facial droop, speech difficulty   HPI: Justin [name] is an 83 y.o. male with medical history significant for hypertension, type 2 diabetes mellitus, PAD, spinocerebellar ataxia, small abdominal aortic dissection managed medically, and tremor who presents with left-sided weakness and speech difficulty.  Patient reports that he was in his usual state when he woke this morning but when he was trying to exercise at approximately 8 AM, he was unable to move his left arm.  He alerted his caregiver to this and his dysarthria seemed to be worse than usual.  Weakness began to improve, was said to be mild in ED triage, and then not appreciable by time of exam by ED physician.  He continued to have increased speech difficulty but the patient and his caregiver feel that his speech is back to baseline by time of arrival to [name].  Justin [name] ED Course: Upon arrival to the ED, patient is found to be afebrile and saturating well on room air with normal HR and stable BP.  Labs are most notable for glucose 221, normal creatinine, and WBC 11,600.  Head CT reveals small age-indeterminate cortical infarct within the left PCA territory and small chronic lacunar infarct within the right basal ganglia.  CTA head and neck is notable for severe mid basilar artery stenosis.  Neurology (Dr. Michaela) was consulted by the ED physician and recommended transfer to Justin [name] for further workup.  Review of Systems:  All other systems reviewed and apart from HPI, are negative.  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 03/06/12   Diaphragmatic hernia without mention of obstruction or gangrene 1982   Hiatal hernia   Diplopia 2006   Disturbance of skin sensation 2003   Diverticulosis of colon (without mention of hemorrhage) 1997   Dizziness and giddiness 2001   Dysphagia, unspecified(787.20) 1999 and   Esophagitis, unspecified 1997   GERD (gastroesophageal reflux disease) 2003   Hypertension 2003   Impotence of organic origin 1999   Lack of coordination September 07, 2007   Lumbago 2003   Migraine with aura, without mention of intractable migraine without mention of status migrainosus 1997   Myalgia and myositis, unspecified 1999   Other and unspecified hyperlipidemia 2003   Other disorders of vitreous 2003   Other malaise and fatigue 2003   Other seborrheic keratosis 2013   Other seborrheic keratosis 04/13/09   Pain in joint, site unspecified 04/13/12   Personal history of fall 04/15/11   Restless legs syndrome (RLS) 1997   Spinocerebellar disease, 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, 2008   Unspecified hearing loss 2003   Unspecified tinnitus 04/15/11    Past Surgical History:  Procedure Laterality Date   APPENDECTOMY  Age 66   boil removed     Dr. Ebbie   C3-4 anterior cervical surgery  1999   Dr. Carles   CARDIAC CATHETERIZATION  2001   Dr. Bulah   COLONOSCOPY  07/20/07   Dr. Jakie   EYE SURGERY  Bilateral 2015   cataracts   LAMINOTOMY / EXCISION DISK POSTERIOR CERVICAL SPINE     Spinal Disk (?4-5)    Social History:   reports that he has never smoked. He quit smokeless tobacco use about 44 years ago.  His smokeless tobacco use included chew. He reports that he does not currently use alcohol. He reports that he does not use drugs.  Allergies  Allergen Reactions   Effexor [Venlafaxine Hcl]    Serzone [Nefazodone]    Vivactil [Protriptyline Hcl]     Family History  Problem Relation Age of  Onset   Heart disease Father        SCA with multiple family members with SCA   Diabetes Neg Hx    Colon cancer Neg Hx    Colon polyps Neg Hx    Esophageal cancer Neg Hx    Kidney disease Neg Hx    Gallbladder disease Neg Hx      Prior to Admission medications   Medication Sig Start Date End Date Taking? Authorizing Provider  acetaminophen  (TYLENOL  8 HOUR ARTHRITIS PAIN) 650 MG CR tablet Take 1 tablet (650 mg total) by mouth 2 (two) times daily. 04/17/19   Reed, Tiffany L, DO  amLODipine  (NORVASC ) 5 MG tablet TAKE 1 TABLET BY MOUTH EVERY DAY 08/05/23   Eubanks, [name] K, NP  atorvastatin  (LIPITOR) 10 MG tablet TAKE 1 TABLET BY MOUTH EVERY DAY 12/20/23   Eubanks, [name] K, NP  hydrochlorothiazide  (HYDRODIURIL ) 12.5 MG tablet TAKE 1 TABLET BY MOUTH EVERY DAY 12/20/23   Eubanks, [name] K, NP  losartan  (COZAAR ) 100 MG tablet Take 1 tablet (100 mg total) by mouth daily. 05/06/23   Justin Harlene POUR, NP  MYRBETRIQ  25 MG TB24 tablet TAKE 1 TABLET (25 MG TOTAL) BY MOUTH DAILY. 12/22/23   Justin Harlene POUR, NP  pantoprazole  (PROTONIX ) 40 MG tablet TAKE 1 TABLET BY MOUTH EVERY DAY 11/01/23   Eubanks, [name] K, NP  potassium chloride  (KLOR-CON ) 10 MEQ tablet TAKE 1 TABLET BY MOUTH EVERY DAY 08/10/23   Eubanks, [name] K, NP  pregabalin  (LYRICA ) 25 MG capsule TAKE 1 CAPSULE BY MOUTH THREE TIMES A DAY 09/08/23   Eubanks, [name] K, NP  Probiotic Product (PROBIOTIC DAILY PO) Take 1 tablet by mouth daily.    [provider]  propranolol  (INDERAL ) 40 MG tablet TAKE 1 TABLET BY MOUTH TWICE A DAY 12/20/23   Eubanks, [name] K, NP  UNABLE TO FIND Take 1 tablet by mouth 2 (two) times daily. Med Name: Super Beta Prostate    [provider]    Physical Exam: Vitals:   03/07/24 1900 03/07/24 1930 03/07/24 1950 03/07/24 2036  BP: (!) 145/76 (!) 146/71  (!) 154/77  Pulse: 81 76 82 78  Resp: (!) 21 20 (!) 21 20  Temp:    98.6 F (37 C)  TempSrc:    Oral  SpO2: 94% 94% 94%   Weight:    82.1 kg   Height:    5' 10 (1.778 m)    Constitutional: NAD, no pallor or diaphoresis   Eyes: PERTLA, lids and conjunctivae normal ENMT: Mucous membranes are moist. Posterior pharynx clear of any exudate or lesions.   Neck: supple, no masses  Respiratory: no wheezing, no crackles. No accessory muscle use.  Cardiovascular: S1 & S2 heard, regular rate and rhythm. No extremity edema.  Abdomen: No tenderness, soft. Bowel sounds active.  Musculoskeletal: no clubbing / cyanosis. No joint deformity upper and lower extremities.   Skin:  no significant rashes, lesions, ulcers. Warm, dry, well-perfused. Neurologic: CN 2-12 grossly intact. Dysarthria, stuttering speech. normal. Strength 5/5 in all 4 limbs. Alert and oriented to person, place, and situation.  Psychiatric: Pleasant. Cooperative.    Labs and Imaging on Admission: I have personally reviewed following labs and imaging studies  CBC: Recent Labs  Lab 03/07/24 1035  WBC 11.6*  NEUTROABS 6.1  HGB 15.1  HCT 44.9  MCV 90.5  PLT 244   Basic Metabolic Panel: Recent Labs  Lab 03/07/24 1035  NA 138  K 4.4  CL 102  CO2 24  GLUCOSE 221*  BUN 18  CREATININE 1.16  CALCIUM  10.0   GFR: Estimated Creatinine Clearance: 50.7 mL/min (by C-G formula based on SCr of 1.16 mg/dL). Liver Function Tests: Recent Labs  Lab 03/07/24 1035  AST 27  ALT 44  ALKPHOS 137*  BILITOT 0.5  PROT 7.1  ALBUMIN 4.4   No results for input(s): LIPASE, AMYLASE in the last 168 hours. No results for input(s): AMMONIA in the last 168 hours. Coagulation Profile: Recent Labs  Lab 03/07/24 1035  INR 0.9   Cardiac Enzymes: No results for input(s): CKTOTAL, CKMB, CKMBINDEX, TROPONINI in the last 168 hours. BNP (last 3 results) No results for input(s): PROBNP in the last 8760 hours. HbA1C: No results for input(s): HGBA1C in the last 72 hours. CBG: Recent Labs  Lab 03/07/24 1029 03/07/24 2214  GLUCAP 217* 122*   Lipid Profile: No  results for input(s): CHOL, HDL, LDLCALC, TRIG, CHOLHDL, LDLDIRECT in the last 72 hours. Thyroid Function Tests: No results for input(s): TSH, T4TOTAL, FREET4, T3FREE, THYROIDAB in the last 72 hours. Anemia Panel: No results for input(s): VITAMINB12, FOLATE, FERRITIN, TIBC, IRON, RETICCTPCT in the last 72 hours. Urine analysis:    Component Value Date/Time   COLORURINE YELLOW 11/16/2019 1520   APPEARANCEUR CLEAR 11/16/2019 1520   LABSPEC 1.022 11/16/2019 1520   PHURINE < OR = 5.0 11/16/2019 1520   GLUCOSEU NEGATIVE 11/16/2019 1520   HGBUR NEGATIVE 11/16/2019 1520   KETONESUR NEGATIVE 11/16/2019 1520   PROTEINUR NEGATIVE 11/16/2019 1520   NITRITE NEGATIVE 11/16/2019 1520   LEUKOCYTESUR NEGATIVE 11/16/2019 1520   Sepsis Labs: @LABRCNTIP (procalcitonin:4,lacticidven:4) )No results found for this or any previous visit (from the past 240 hours).   Radiological Exams on Admission: CT ANGIO HEAD NECK W WO CM (CODE STROKE) Result Date: 03/07/2024 CLINICAL DATA:  Left-sided weakness EXAM: CT ANGIOGRAPHY HEAD AND NECK WITH AND WITHOUT CONTRAST TECHNIQUE: Multidetector CT imaging of the head and neck was performed using the standard protocol during bolus administration of intravenous contrast. Multiplanar CT image reconstructions and MIPs were obtained to evaluate the vascular anatomy. Carotid stenosis measurements (when applicable) are obtained utilizing NASCET criteria, using the distal internal carotid diameter as the denominator. RADIATION DOSE REDUCTION: This exam was performed according to the departmental dose-optimization program which includes automated exposure control, adjustment of the mA and/or kV according to patient size and/or use of iterative reconstruction technique. CONTRAST:  75mL OMNIPAQUE IOHEXOL 350 MG/ML SOLN COMPARISON:  None Available. CTA NECK: CTA NECK Aortic arch: No proximal vessel stenosis. Right carotid: There is a small amount of plaque  at the bifurcation without significant stenosis Left carotid: Small amount of plaque at the bifurcation without significant stenosis Right vertebral: Normal Left vertebral: Normal Soft tissues: No significant abnormality Other comments: None CTA HEAD: CTA HEAD Right anterior circulation: The internal carotid artery is patent without significant stenosis. The anterior and middle cerebral arteries are patent without significant  stenosis or proximal branch occlusion. No aneurysm. Left anterior circulation: The internal carotid artery is patent without significant stenosis. The anterior and middle cerebral arteries are patent without significant stenosis or proximal branch occlusion. No aneurysm. Posterior circulation: Both vertebral arteries are patent. 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 Both posterior cerebral arteries are patent. IMPRESSION: 1. No extracranial carotid artery stenosis 2. Severe mid basilar artery stenosis 3. No proximal vessel occlusion in the anterior circulation Electronically Signed   By: Nancyann Burns M.D.   On: 03/07/2024 11:52   CT HEAD CODE STROKE WO CONTRAST Result Date: 03/07/2024 CLINICAL DATA:  Code stroke. Provided history: Neuro deficit, acute, stroke suspected. Slurred speech. Left-sided weakness. EXAM: CT HEAD WITHOUT CONTRAST TECHNIQUE: Contiguous axial images were obtained from the base of the skull through the vertex without intravenous contrast. RADIATION DOSE REDUCTION: This exam was performed according to the departmental dose-optimization program which includes automated exposure control, adjustment of the mA and/or kV according to patient size and/or use of iterative reconstruction technique. COMPARISON:  Brain MRI 09/03/2006. FINDINGS: Brain: Generalized cerebral and cerebellar atrophy. Small age-indeterminate cortical infarct within the left occipital lobe (PCA vascular territory) (series 5, image 14). Patchy and ill-defined hypoattenuation within the cerebral white  matter, nonspecific but compatible with mild chronic small vessel ischemic disease. Small chronic appearing lacunar infarct within the right lentiform nucleus. There is no acute intracranial hemorrhage. No extra-axial fluid collection. No evidence of an intracranial mass. No midline shift. Vascular: No hyperdense vessel.  Atherosclerotic calcifications. Skull: No calvarial fracture or aggressive osseous lesion. Sinuses/Orbits: No mass or acute finding within the imaged orbits. No significant paranasal sinus disease at the imaged levels. ASPECTS (Alberta Stroke Program Early CT Score) - Ganglionic level infarction (caudate, lentiform nuclei, internal capsule, insula, M1-M3 cortex): 7 - Supraganglionic infarction (M4-M6 cortex): 3 Total score (0-10 with 10 being normal): 10 (when discounting a chronic right basal ganglia lacunar infarct). These results were called by telephone at the time of interpretation on 03/07/2024 at 10:38 am to provider JOSHUA LONG , who verbally acknowledged these results. IMPRESSION: 1. Small age-indeterminate cortical infarct within the left occipital lobe (PCA vascular territory). A brain MRI may be obtained for further evaluation, as clinically warranted. 2. 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 3. Background parenchymal atrophy and mild cerebral white matter chronic small vessel ischemic disease. Electronically Signed   By: Rockey Childs D.O.   On: 03/07/2024 10:41    EKG: Independently reviewed. Sinus rhythm.   Assessment/Plan   1. Left-sided weakness and dysarthria  - Weakness appears to have resolved and patient and his caregiver report that speech is back to baseline  - No acute findings on head CT in ED; CTA head & neck shows severe mid basilar artery stenosis  - He passed a stroke swallow screen - Continue cardiac monitoring and frequent neuro checks, check MRI brain, echocardiogram, A1c, and lipids, continue ASA and statin, consult PT/OT/SLP   2.  Type II DM  - A1c was 7.1% in April 2024, diet-controlled at home  - Check CBGs and use low-intensity SSI for now    3. PAD  - No acute ischemia  - Continue ASA and Lipitor    4. Spinocerebellar ataxia  - Followed by neurology  - Supportive care    5. Dissection of abdominal aorta  - Hx of small abdominal aorta dissection followed by vascular surgery and managed medically   6. Hypertension  - Hold scheduled antihypertensives for now while  evaluating for possible acute ischemic CVA    DVT prophylaxis: Lovenox   Code Status: DNR/DNI  Level of Care: Level of care: Telemetry Medical Family Communication: Caregiver updated from ED  Disposition Plan:  Patient is from: Home  Anticipated d/c is to: Home  Anticipated d/c date is: 10/8 or 03/09/24  Patient currently: Pending TIA/CVA workup  Consults called: Neurology  Admission status: Observation     Evalene GORMAN Sprinkles, MD [facility]  03/07/2024, 11:03 PM

## 2024-03-07 NOTE — ED Notes (Signed)
 Pt returned from CT w primary RN

## 2024-03-08 ENCOUNTER — Observation Stay (HOSPITAL_COMMUNITY)

## 2024-03-08 ENCOUNTER — Other Ambulatory Visit: Payer: Self-pay

## 2024-03-08 DIAGNOSIS — G459 Transient cerebral ischemic attack, unspecified: Secondary | ICD-10-CM

## 2024-03-08 DIAGNOSIS — E119 Type 2 diabetes mellitus without complications: Secondary | ICD-10-CM | POA: Diagnosis not present

## 2024-03-08 DIAGNOSIS — G319 Degenerative disease of nervous system, unspecified: Secondary | ICD-10-CM | POA: Diagnosis not present

## 2024-03-08 DIAGNOSIS — G9389 Other specified disorders of brain: Secondary | ICD-10-CM | POA: Diagnosis not present

## 2024-03-08 DIAGNOSIS — R29818 Other symptoms and signs involving the nervous system: Secondary | ICD-10-CM

## 2024-03-08 DIAGNOSIS — I10 Essential (primary) hypertension: Secondary | ICD-10-CM

## 2024-03-08 DIAGNOSIS — R9089 Other abnormal findings on diagnostic imaging of central nervous system: Secondary | ICD-10-CM | POA: Diagnosis not present

## 2024-03-08 DIAGNOSIS — I7102 Dissection of abdominal aorta: Secondary | ICD-10-CM

## 2024-03-08 DIAGNOSIS — I639 Cerebral infarction, unspecified: Secondary | ICD-10-CM

## 2024-03-08 LAB — CBC
HCT: 43.2 % (ref 39.0–52.0)
Hemoglobin: 14.5 g/dL (ref 13.0–17.0)
MCH: 30 pg (ref 26.0–34.0)
MCHC: 33.6 g/dL (ref 30.0–36.0)
MCV: 89.3 fL (ref 80.0–100.0)
Platelets: 225 K/uL (ref 150–400)
RBC: 4.84 MIL/uL (ref 4.22–5.81)
RDW: 13 % (ref 11.5–15.5)
WBC: 15.3 K/uL — ABNORMAL HIGH (ref 4.0–10.5)
nRBC: 0 % (ref 0.0–0.2)

## 2024-03-08 LAB — COMPREHENSIVE METABOLIC PANEL WITH GFR
ALT: 35 U/L (ref 0–44)
AST: 23 U/L (ref 15–41)
Albumin: 3.4 g/dL — ABNORMAL LOW (ref 3.5–5.0)
Alkaline Phosphatase: 108 U/L (ref 38–126)
Anion gap: 12 (ref 5–15)
BUN: 14 mg/dL (ref 8–23)
CO2: 23 mmol/L (ref 22–32)
Calcium: 9.1 mg/dL (ref 8.9–10.3)
Chloride: 101 mmol/L (ref 98–111)
Creatinine, Ser: 1.07 mg/dL (ref 0.61–1.24)
GFR, Estimated: >60 mL/min (ref >60)
Glucose, Bld: 154 mg/dL — ABNORMAL HIGH (ref 70–99)
Potassium: 3.6 mmol/L (ref 3.5–5.1)
Sodium: 136 mmol/L (ref 135–145)
Total Bilirubin: 1.1 mg/dL (ref 0.0–1.2)
Total Protein: 6.2 g/dL — ABNORMAL LOW (ref 6.5–8.1)

## 2024-03-08 LAB — LIPID PANEL
Cholesterol: 121 mg/dL (ref 0–200)
HDL: 36 mg/dL — ABNORMAL LOW (ref >40)
LDL Cholesterol: 44 mg/dL (ref 0–99)
Total CHOL/HDL Ratio: 3.4 ratio
Triglycerides: 207 mg/dL — ABNORMAL HIGH (ref <150)
VLDL: 41 mg/dL — ABNORMAL HIGH (ref 0–40)

## 2024-03-08 LAB — HEMOGLOBIN A1C
Hgb A1c MFr Bld: 6.7 % — ABNORMAL HIGH (ref 4.8–5.6)
Mean Plasma Glucose: 145.59 mg/dL

## 2024-03-08 LAB — ECHOCARDIOGRAM COMPLETE
Area-P 1/2: 3.27 cm2
Calc EF: 69.4 %
Height: 70 in
S' Lateral: 2.15 cm
Single Plane A2C EF: 71.7 %
Single Plane A4C EF: 68.6 %
Weight: 2895.96 [oz_av]

## 2024-03-08 LAB — GLUCOSE, CAPILLARY
Glucose-Capillary: 152 mg/dL — ABNORMAL HIGH (ref 70–99)
Glucose-Capillary: 138 mg/dL — ABNORMAL HIGH (ref 70–99)
Glucose-Capillary: 193 mg/dL — ABNORMAL HIGH (ref 70–99)
Glucose-Capillary: 148 mg/dL — ABNORMAL HIGH (ref 70–99)

## 2024-03-08 MED ORDER — TRAMADOL HCL 50 MG PO TABS
25.0000 mg | ORAL_TABLET | Freq: Two times a day (BID) | ORAL | Status: DC | PRN
  Administered 2024-03-08: 25 mg via ORAL
  Filled 2024-03-08: qty 1

## 2024-03-08 NOTE — Progress Notes (Signed)
  Echocardiogram 2D Echocardiogram has been performed.  Justin [name] 03/08/2024, 2:36 PM

## 2024-03-08 NOTE — TOC CM/SW Note (Signed)
 Transition of Care [facility]) - Inpatient Brief Assessment   Patient Details  Name: Justin [name] MRN: [id number] Date of Birth: May 24, 1941  [facility]) CM/SW Contact:    Tom-Johnson, Harvest Muskrat, RN Phone Number: 03/08/2024, 3:26 PM   Clinical Narrative:  Patient presented to the ED with Left Arm Weakness, Rt Facial Droop and Speech difficulty.  Patient has hx of T2DM, PAD, Spinocerebellar Ataxia, small Abdominal Aortic Dissection, Tremors, HTN.  CT Head showed  small age-indeterminate Cortical Infarct within the left PCA territory and small chronic Lacunar Infarct within the Rt Basal Ganglia. CTA Head/Neck showed severe mid Basilar Artery Stenosis. Neurology following.     CM spoke with patient and caregiver, Bascom at bedside about needs for post hospital transition.  Patient's caregiver, Bascom lives with him, has two supportive children. Modified Independent, has all necessary DME's at home. Bascom transports to and from appointments.  PCP is Caro Harlene POUR, NP and uses [facility] on [address].   Home health recommended, patient states he has no preference. CM sent referral to [facility] and Darleene noted acceptance, info on AVS.  Patient not Medically ready for discharge.  CM will continue to follow as patient progresses with care towards discharge.        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

## 2024-03-08 NOTE — Plan of Care (Signed)
  Problem: Education: Goal: Knowledge of General Education information will improve Description: Including pain rating scale, medication(s)/side effects and non-pharmacologic comfort measures Outcome: Progressing   Problem: Clinical Measurements: Goal: Ability to maintain clinical measurements within normal limits will improve Outcome: Progressing Goal: Diagnostic test results will improve Outcome: Progressing Goal: Respiratory complications will improve Outcome: Progressing Goal: Cardiovascular complication will be avoided Outcome: Progressing   Problem: Activity: Goal: Risk for activity intolerance will decrease Outcome: Progressing   Problem: Nutrition: Goal: Adequate nutrition will be maintained Outcome: Progressing   Problem: Coping: Goal: Level of anxiety will decrease Outcome: Progressing   Problem: Elimination: Goal: Will not experience complications related to bowel motility Outcome: Progressing Goal: Will not experience complications related to urinary retention Outcome: Progressing   Problem: Pain Managment: Goal: General experience of comfort will improve and/or be controlled Outcome: Progressing   Problem: Safety: Goal: Ability to remain free from injury will improve Outcome: Progressing   Problem: Skin Integrity: Goal: Risk for impaired skin integrity will decrease Outcome: Progressing   Problem: Education: Goal: Ability to describe self-care measures that may prevent or decrease complications (Diabetes Survival Skills Education) will improve Outcome: Progressing Goal: Individualized Educational Video(s) Outcome: Progressing   Problem: Coping: Goal: Ability to adjust to condition or change in health will improve Outcome: Progressing   Problem: Fluid Volume: Goal: Ability to maintain a balanced intake and output will improve Outcome: Progressing   Problem: Health Behavior/Discharge Planning: Goal: Ability to identify and utilize available  resources and services will improve Outcome: Progressing Goal: Ability to manage health-related needs will improve Outcome: Progressing   Problem: Metabolic: Goal: Ability to maintain appropriate glucose levels will improve Outcome: Progressing   Problem: Nutritional: Goal: Maintenance of adequate nutrition will improve Outcome: Progressing Goal: Progress toward achieving an optimal weight will improve Outcome: Progressing   Problem: Skin Integrity: Goal: Risk for impaired skin integrity will decrease Outcome: Progressing   Problem: Tissue Perfusion: Goal: Adequacy of tissue perfusion will improve Outcome: Progressing   Problem: Education: Goal: Knowledge of disease or condition will improve Outcome: Progressing Goal: Knowledge of secondary prevention will improve (MUST DOCUMENT ALL) Outcome: Progressing Goal: Knowledge of patient specific risk factors will improve (DELETE if not current risk factor) Outcome: Progressing   Problem: Ischemic Stroke/TIA Tissue Perfusion: Goal: Complications of ischemic stroke/TIA will be minimized Outcome: Progressing   Problem: Coping: Goal: Will verbalize positive feelings about self Outcome: Progressing Goal: Will identify appropriate support needs Outcome: Progressing   Problem: Health Behavior/Discharge Planning: Goal: Ability to manage health-related needs will improve Outcome: Progressing Goal: Goals will be collaboratively established with patient/family Outcome: Progressing   Problem: Self-Care: Goal: Ability to participate in self-care as condition permits will improve Outcome: Progressing Goal: Verbalization of feelings and concerns over difficulty with self-care will improve Outcome: Progressing Goal: Ability to communicate needs accurately will improve Outcome: Progressing   Problem: Nutrition: Goal: Risk of aspiration will decrease Outcome: Progressing Goal: Dietary intake will improve Outcome: Progressing

## 2024-03-08 NOTE — Evaluation (Signed)
 Occupational Therapy Evaluation Patient Details Name: Justin [name] MRN: [id number] DOB: Jul 09, 1940 Today's Date: 03/08/2024   History of Present Illness   Justin [name] is a 83 y.o. male who presents with episode of slurred speech, left arm weakness and a left facial droop. 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 Pt now presents with decreased activity tolerance, decreased balance, and decreased safety and independence with functional tasks. Pt B UE strength, ROM, and sensation largely symmetrical and WFL. Pt with ataxia at baseline.  Pt reports intermittently seeing objects that are not there in his Right peripheral vision since last Friday (03/03/24) with vision WFL this session during functional tasks and vision screen. Pt currently demonstrates ability to complete ADLs largely with Set up to Min assist, bed mobility with Mod I, and functional step-pivot transfers with a RW with Min assist. Pt's caregiver present during OT eval and demonstrates ability to Independently assist pt with ADLs at current functional level. Pt VSS on RA. Pt participated well in session and will benefit from acute skilled OT services to address deficits and increase safety and independence with functional tasks. No post-acute skilled OT needs are anticipated.      If plan is discharge home, recommend the following:   A lot of help with walking and/or transfers;A little help with bathing/dressing/bathroom;Assistance with cooking/housework;Assist for transportation;Help with stairs or ramp for entrance     Functional Status Assessment   Patient has had a recent decline in their functional status and demonstrates the ability to make significant improvements in function in a reasonable and predictable amount of  time.     Equipment Recommendations   None recommended by OT (Pt already has needed equipment)     Recommendations for Other Services         Precautions/Restrictions   Precautions Precautions: Fall;Other (comment) Recall of Precautions/Restrictions: Intact Precaution/Restrictions Comments: Baseline ataxia Restrictions Weight Bearing Restrictions Per Provider Order: No     Mobility Bed Mobility Overal bed mobility: Modified Independent                  Transfers Overall transfer level: Needs assistance Equipment used: Rolling walker (2 wheels) Transfers: Sit to/from Stand Sit to Stand: Supervision           General transfer comment: no physical assistance required.      Balance Overall balance assessment: Needs assistance Sitting-balance support: Feet supported, Single extremity supported, Bilateral upper extremity supported, No upper extremity supported Sitting balance-Leahy Scale: Fair Sitting balance - Comments: Patient able to maintain static sitting without support sitting EOB. Pt with noted Right lateral and Posterior lean in dynamic sitting at EOB during ADLs with pt able to self-correct with cues and increased time. Postural control: Right lateral lean, Posterior lean (in dynamic sitting at EOB with pt able to self-correct with cues and increased time) Standing balance support: Bilateral upper extremity supported, Reliant on assistive device for balance, During functional activity Standing balance-Leahy Scale: Poor Standing balance comment: Reliant on RW                           ADL either performed or assessed with clinical judgement   ADL Overall ADL's : Needs assistance/impaired Eating/Feeding: Modified independent;Set up;Sitting  Grooming: Set up;Sitting   Upper Body Bathing: Contact guard assist;Sitting   Lower Body Bathing: Contact guard assist;Minimal assistance;Sit to/from stand   Upper Body Dressing : Set  up;Sitting   Lower Body Dressing: Supervision/safety;Contact guard assist;Sit to/from stand   Toilet Transfer: Minimal assistance;BSC/3in1;Rolling walker (2 wheels) (step-pivot) Toilet Transfer Details (indicate cue type and reason): simulated at EOB Toileting- Clothing Manipulation and Hygiene: Contact guard assist;Minimal assistance;Sit to/from stand         General ADL Comments: Pt requires increased time for tasks secondary to ataxia at baseline.     Vision Baseline Vision/History: 1 Wears glasses Ability to See in Adequate Light: 0 Adequate (with glasses) Patient Visual Report: Other (comment) (Pt reports intermittently seeing things that aren't there (ex. a person, a box, etc.) in his Right peripheral vision since last Friday (03/03/24). Pt reports no other recent change in vision.) Vision Assessment?: Yes Eye Alignment: Within Functional Limits Ocular Range of Motion: Within Functional Limits Alignment/Gaze Preference: Within Defined Limits Tracking/Visual Pursuits: Able to track stimulus in all quads without difficulty Saccades: Within functional limits Convergence: Within functional limits Visual Fields: Other (comment) (No apparent deficits this session, but with pt reporting intermittently seeing objects that are not there in his Right peripheral vision since last Friday (03/03/24)) Diplopia Assessment:  (Pt reports no diplopia) Depth Perception:  [facility])     Perception         Praxis         Pertinent Vitals/Pain Pain Assessment Pain Assessment: 0-10 Pain Score: 4  Pain Location: Headache Pain Descriptors / Indicators: Headache Pain Intervention(s): Limited activity within patient's tolerance, Monitored during session, Repositioned     Extremity/Trunk Assessment Upper Extremity Assessment Upper Extremity Assessment: Right hand dominant;Overall WFL for tasks assessed;RUE deficits/detail;LUE deficits/detail (B UE largely symmetrical) RUE Deficits / Details: ROM,  strength, and sensation WFL; ataxia at baseline RUE Coordination: decreased gross motor;decreased fine motor (baseline) LUE Deficits / Details: ROM, strength, and sensation WFL; ataxia at baseline LUE Coordination: decreased gross motor;decreased fine motor (baseline)   Lower Extremity Assessment Lower Extremity Assessment: Defer to PT evaluation   Cervical / Trunk Assessment Cervical / Trunk Assessment: Kyphotic   Communication Communication Communication: Impaired Factors Affecting Communication: Difficulty expressing self;Reduced clarity of speech (Baseline)   Cognition Arousal: Alert Behavior During Therapy: WFL for tasks assessed/performed Cognition: No apparent impairments             OT - Cognition Comments: Pt AAOx4 and pleasant throughout session. Pt requires increased time for processing and motor planning, but largely demonstrates cognition [facility] for tasks assessed. Cognition not formally screened or evaluated.                 Following commands: Intact       Cueing  General Comments   Cueing Techniques: Verbal cues;Tactile cues;Visual cues  VSS on RA. Paid caregiver present and supportive during a portion of session. Case Manager present during a portion of session.   Exercises     Shoulder Instructions      Home Living Family/patient expects to be discharged to:: Private residence Living Arrangements: Other (Comment) (Caregiver) Available Help at Discharge: Personal care attendant;Available 24 hours/day Type of Home: House Home Access: Ramped entrance     Home Layout: One level     Bathroom Shower/Tub: Producer, television/film/video: Handicapped height Bathroom Accessibility: Yes   Home Equipment: Agricultural consultant (2 wheels);Wheelchair - manual;Grab bars - toilet;Grab bars - tub/shower;Hand held shower head;Shower seat;Other (comment) (grab  bars/hand rails in the hallway; hand held urinal)          Prior Functioning/Environment Prior  Level of Function : Needs assist             Mobility Comments: At baseline, pt ambulates with a RW ADLs Comments: At baseline, pt is Mod I to Set up for self-feeding, grooming, and dressing. Mod I to Contact guard assist for toileting and Supervision to Contact guard assist with showering. Live-in paid caregiver assists with all IADLs.    OT Problem List: Decreased activity tolerance;Impaired balance (sitting and/or standing)   OT Treatment/Interventions: Self-care/ADL training;Therapeutic exercise;Energy conservation;DME and/or AE instruction;Therapeutic activities;Patient/family education;Balance training      OT Goals(Current goals can be found in the care plan section)   Acute Rehab OT Goals Patient Stated Goal: to return home and recover well OT Goal Formulation: With patient Time For Goal Achievement: 03/22/24 Potential to Achieve Goals: Good ADL Goals Pt Will Perform Grooming: with supervision;standing Pt Will Perform Lower Body Dressing: with supervision;sit to/from stand Pt Will Transfer to Toilet: with contact guard assist;ambulating;regular height toilet (with least restrictive AD) Pt Will Perform Toileting - Clothing Manipulation and hygiene: with supervision;sitting/lateral leans;sit to/from stand   OT Frequency:  Min 2X/week    Co-evaluation              AM-PAC OT 6 Clicks Daily Activity     Outcome Measure Help from another person eating meals?: A Little Help from another person taking care of personal grooming?: A Little Help from another person toileting, which includes using toliet, bedpan, or urinal?: A Little Help from another person bathing (including washing, rinsing, drying)?: A Little Help from another person to put on and taking off regular upper body clothing?: A Little Help from another person to put on and taking off regular lower body clothing?: A Little 6 Click Score: 18   End of Session Equipment Utilized During Treatment: Rolling  walker (2 wheels);Gait belt Nurse Communication: Mobility status  Activity Tolerance: Patient tolerated treatment well Patient left: in bed;with call bell/phone within reach;with family/visitor present  OT Visit Diagnosis: Unsteadiness on feet (R26.81);Other abnormalities of gait and mobility (R26.89)                Time: 8554-8478 OT Time Calculation (min): 36 min Charges:  OT General Charges $OT Visit: 1 Visit OT Evaluation $OT Eval Moderate Complexity: 1 Mod OT Treatments $Self Care/Home Management : 8-22 mins  Margarie Rockey HERO., OTR/L, MA Acute Rehab [phone]   Margarie FORBES Horns 03/08/2024, 4:41 PM

## 2024-03-08 NOTE — Progress Notes (Addendum)
 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   No family at the bedside. Therapy is just beginning to work with him   He states yesterday he developed left side weakness that lasted for little while. He started to have trouble eating and speech  and called his caregiver.   CBC    Component Value Date/Time   WBC 15.3 (H) 03/08/2024 0230   RBC 4.84 03/08/2024 0230   HGB 14.5 03/08/2024 0230   HCT 43.2 03/08/2024 0230   PLT 225 03/08/2024 0230   MCV 89.3 03/08/2024 0230   MCH 30.0 03/08/2024 0230   MCHC 33.6 03/08/2024 0230   RDW 13.0 03/08/2024 0230   RDW 13.6 04/12/2013 1244   LYMPHSABS 4.5 (H) 03/07/2024 1035   LYMPHSABS 1.9 04/12/2013 1244   MONOABS 0.8 03/07/2024 1035   EOSABS 0.1 03/07/2024 1035   EOSABS 0.2 04/12/2013 1244   BASOSABS 0.1 03/07/2024 1035   BASOSABS 0.0 04/12/2013 1244    BMET    Component Value Date/Time   NA 136 03/08/2024 0230   NA 139 04/19/2020 1037   K 3.6 03/08/2024 0230   CL 101 03/08/2024 0230   CO2 23 03/08/2024 0230   GLUCOSE 154 (H) 03/08/2024 0230   BUN 14 03/08/2024 0230   BUN 19 04/19/2020 1037   CREATININE 1.07 03/08/2024 0230   CREATININE 0.94 06/09/2023 0909   CALCIUM  9.1 03/08/2024 0230   EGFR 81 06/09/2023 0909   GFRNONAA >60 03/08/2024 0230   GFRNONAA 73 10/04/2019 0949    IMAGING past 24 hours MR BRAIN WO CONTRAST Result Date: 03/08/2024 EXAM: MR Brain without Intravenous Contrast. CLINICAL HISTORY: 83 year old male with acute neuro deficit, stroke suspected, and age-indeterminate left PCA cortical infarct by CT. TECHNIQUE: Magnetic resonance images of the brain without  intravenous contrast in multiple planes. CONTRAST: Without. COMPARISON: Head CT and CTA head and neck yesterday. Previous brain MRI 09/03/2006. FINDINGS: BRAIN: Confirmed small areas of abnormality diffusion in the lateral left occipital lobe cortex (series 5 image 70, corresponding to CT finding), also subcortical white matter of the left lateral occipital lobe on series 5 image 74. These appear mildly restricted associated T2 and FLAIR hyperintense cytotoxic edema at those areas with no hemorrhagic transformation or mass effect. No other diffusion restriction. The central arterial and venous flow voids are patent. Major vascular flow voids appear stable from the 2008 MRI. Generalized cerebral hemisphere volume loss since 2008 appears to be age appropriate. However, cerebellar atrophy is advanced and disproportionate (sagittal image 13). No superimposed chronic cerebral blood products. There is chronic encephalomalacia along both posterior insular cortex (series 11 image 29) new from 2008. Elsewhere only mild for age T2 heterogeneity in the basal ganglia. No midline shift or extra-axial fluid collection. No intracranial mass or hemorrhage. VENTRICLES: No hydrocephalus. ORBITS: Postoperative changes to both globes since 2008. SINUSES AND MASTOIDS: The sinuses and mastoid air cells are clear. 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 No acute fracture or focal osseous lesion. IMPRESSION: 1. Confirmed small subacute left PCA territory infarcts in the occipital lobe with No hemorrhagic transformation or mass effect. 2. Advanced cerebellar atrophy since 2008, nonspecific. 3. Otherwise mild chronic encephalomalacia along the  bilateral posterior insular cortex is also new from 2008. Electronically signed by: Helayne Hurst MD 03/08/2024 06:18 AM EDT RP Workstation: [id number]    Vitals:   03/08/24 0100 03/08/24 0435 03/08/24 0500 03/08/24 0752  BP:  139/76  (!)  142/70  Pulse: 80 80 76   Resp: 20 16 20    Temp:  97.6 F (36.4 C)  97.7 F (36.5 C)  TempSrc:  Oral  Oral  SpO2: 93% 94% 94%   Weight:      Height:         PHYSICAL EXAM General:  Alert, well-nourished, well-developed patient in no acute distress Psych:  Mood and affect appropriate for situation CV: Regular rate and rhythm on monitor Respiratory:  Regular, unlabored respirations on room air GI: Abdomen soft and nontender   NEURO:  Mental Status: AA&Ox3, ataxic patient is able to give clear and coherent history Speech/Language: speech is with staccato dysarthria  .  Naming, repetition, fluency, and comprehension intact.  Cranial Nerves:  II: PERRL. Visual fields full.  III, IV, VI: EOMI. Eyelids elevate symmetrically. Right nystagmus, saccadic eye movements V: Sensation is intact to light touch and symmetrical to face.  VII: Face is symmetrical resting and smiling VIII: hearing intact to voice. IX, X: Palate elevates symmetrically. Phonation is normal.  KP:Dynloizm shrug 5/5. XII: tongue is midline without fasciculations. Motor: 5/5 strength to all muscle groups tested.  Tone: is normal and bulk is normal Sensation- Intact to light touch bilaterally. Extinction absent to light touch to DSS.   Coordination: ataxia in all extremities L>R Gait- deferred   ASSESSMENT/PLAN  [name] is a 83 y.o. 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 Patient got up earlier in the day and reports that he was trying to pull himself up at around 8 AM in the morning on 03/07/2024 when his left arm suddenly gave out. Caretaker noted that his speech was worse and he had a left facial droop. NIH on Admission 2  Right hemisphere TIA Incidental acute left occipital ischemic infarct  Etiology:  embolic cryptogenic source Code Stroke  CT head  Small age-indeterminate cortical infarct within the left occipital lobe. 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 atrophy chronic small vessel ischemic disease.  CTA head & neck no LVO  MRI  small subacute left PCA territory infarcts in the occipital lobe  2D Echo ordered  LDL 44 Recommend 30 day heart monitor  HgbA1c 6.7 VTE prophylaxis - Lovenox   aspirin  81 mg daily prior to admission, now on aspirin  81 mg daily and clopidogrel 75 mg daily for 3 weeks and then plavix alone. 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  Hypertension Home meds:  losartan  100mg , propanolol 40 mg, amlodipine  5 mg, hydrochlorothiazide  12.5 mg  Stable Blood Pressure Goal: SBP less than 160   Hyperlipidemia Home meds:  atorvastatin  10mg  ,  resumed in hospital LDL 44, goal < 70 Continue statin at discharge  Dysphagia Patient has post-stroke dysphagia, SLP consulted    Diet   Diet regular Fluid consistency: Thin   Advance diet as tolerated   Hospital day # 0  Karna Geralds DNP, ACNPC-AG  [facility]  I have personally obtained history,examined this patient, reviewed notes, independently viewed imaging studies, participated  in medical decision making and plan of care.ROS completed by me personally and pertinent positives fully documented  I have made any additions or clarifications directly to the above note. Agree with note above.  Patient presented with transient left-sided weakness which appears to have resolved.  MRI shows a incidental silent left parietal occipital embolic infarct making cryptogenic embolism likely etiology.  Recommend continue ongoing stroke workup.  Patient will need 30-day prolonged cardiac monitoring after discharge to look for paroxysmal A-fib.  Recommend aspirin  and Plavix for 3 weeks followed by Plavix alone and aggressive risk factor modification.   Follow-up as an outpatient with her neurologist Dr. Evonnie after discharge.  No family available at the bedside for discussion.  Discussed with Dr.Ghimire   I personally spent a total of 50 minutes in the care of the patient today including getting/reviewing separately obtained history, performing a medically appropriate exam/evaluation, counseling and educating, placing orders, referring and communicating with other health care professionals, documenting clinical information in the EHR, independently interpreting results, and coordinating care.        Eather Popp, MD Medical Director [facility] Pager: [phone] 03/08/2024 4:51 PM   To contact Stroke Continuity provider, please refer to [email or web address]. After hours, contact General Neurology

## 2024-03-08 NOTE — Progress Notes (Signed)
 SLP Cancellation Note  Patient Details Name: Justin [name] MRN: [id number] DOB: 10-01-1940   Cancelled treatment:       Reason Eval/Treat Not Completed: SLP screened, no needs identified, will sign off. Justin [name] is a 83 y.o. male with SCA 6 and baseline ataxia, history of hypertension hyperlipidemia who presents with a episode of left arm weakness along with left facial droop and worsening of his baseline ataxic speech.  Episodes resolved spontaneously in about an hour per MD. Per RN, patient has passed the swallow screen and is tolerating a regular diet well and his speech/cognitive function is reported to be at baseline. No formal eval indicated at this time. Please re-consult if needed.   Justin Winer MA, CCC-SLP    Justin [name] 03/08/2024, 11:18 AM

## 2024-03-08 NOTE — Progress Notes (Signed)
 PROGRESS NOTE        PATIENT DETAILS Name: Justin [name] Age: 83 y.o. Sex: male Date of Birth: 1941-04-07 Admit Date: 03/07/2024 Admitting Physician Evalene GORMAN Sprinkles, MD ERE:Zlajwxd, Harlene POUR, NP  Brief Summary: Patient is a 83 y.o.  male with history of spinocerebellar ataxia, abdominal aortic dissection managed medically, HTN, DM-2-who presented with transient left arm weakness/dysarthria.  Upon further evaluation with MRI imaging he was found to have acute CVA.  Significant events: 10/7>> admit to [facility].  Significant studies: 10/7>> CT angio head/neck: No extracranial carotid artery stenosis, severe mid basilar artery stenosis. 10/7>> MRI brain: Small subacute left PCA territory infarct in the occipital lobe. 10/7>> LDL: 44 10/7>> A1c: 6.7 10/8>> echo: Pending  Significant microbiology data: None  Procedures: None  Consults: Neurology  Subjective: Lying comfortably in bed-denies any chest pain or shortness of breath.  Dysarthria/left arm weakness has resolved-patient back to baseline.  Objective: Vitals: Blood pressure (!) 142/70, pulse 76, temperature 97.7 F (36.5 C), temperature source Oral, resp. rate 20, height 5' 10 (1.778 m), weight 82.1 kg, SpO2 94%.   Exam: Gen Exam:Alert awake-not in any distress HEENT:atraumatic, normocephalic Chest: B/L clear to auscultation anteriorly CVS:S1S2 regular Abdomen:soft non tender, non distended Extremities:no edema Neurology: Generalized weakness but moving all 4 extremities-appears symmetrical. Skin: no rash  Pertinent Labs/Radiology:    Latest Ref Rng & Units 03/08/2024    2:30 AM 03/07/2024   10:35 AM 04/16/2023    9:18 AM  CBC  WBC 4.0 - 10.5 K/uL 15.3  11.6  9.0   Hemoglobin 13.0 - 17.0 g/dL 85.4  84.8  85.4   Hematocrit 39.0 - 52.0 % 43.2  44.9  43.3   Platelets 150 - 400 K/uL 225  244  251     Lab Results  Component Value Date   NA 136 03/08/2024   K 3.6 03/08/2024   CL 101  03/08/2024   CO2 23 03/08/2024      Assessment/Plan: Acute ischemic CVA Presenting focal deficits have resolved Discussed with neurology-suspected embolic etiology Workup as above Recommendations from Dr. Rheba Danley x 3 weeks followed by Plavix alone, outpatient 30-day cardiac monitoring (epic message sent to cardiology team-per cardiology team coordinator-Patricia Trent-office will mail it to the patient.) Continue statin on discharge. Evaluated by PT/OT-Home health recommended.  DM-2 Diet controlled at home Follow with PCP  PAD Continue antiplatelet/statin  History of dissection of abdominal aortic aorta Chronic issue-followed by vascular surgery and manage medically  Spinocerebellar ataxia Followed by neurology Lives with 24/7 care at home-ambulates with help of a walker.  HTN Initially permissive hypertension was allowed Resume usual antihypertensives on discharge.   Code status:   Code Status: Limited: Do not attempt resuscitation (DNR) -DNR-LIMITED -Do Not Intubate/DNI    DVT Prophylaxis: enoxaparin  (LOVENOX ) injection 40 mg Start: 03/07/24 2215   Family Communication: None at bedside   Disposition Plan: Status is: Observation The patient remains OBS appropriate and will d/c before 2 midnights.   Planned Discharge Destination:Home health   Diet: Diet Order             Diet regular Fluid consistency: Thin  Diet effective now                     Antimicrobial agents: Anti-infectives (From admission, onward)    None  MEDICATIONS: Scheduled Meds:  aspirin  EC  81 mg Oral Daily   atorvastatin   10 mg Oral Daily   enoxaparin  (LOVENOX ) injection  40 mg Subcutaneous Q24H   insulin aspart  0-5 Units Subcutaneous QHS   insulin aspart  0-6 Units Subcutaneous TID WC   pantoprazole   40 mg Oral Daily   pregabalin   25 mg Oral TID   Continuous Infusions: PRN Meds:.acetaminophen  **OR** acetaminophen  (TYLENOL ) oral liquid 160 mg/5  mL **OR** acetaminophen , senna-docusate, traMADol   I have personally reviewed following labs and imaging studies  LABORATORY DATA: CBC: Recent Labs  Lab 03/07/24 1035 03/08/24 0230  WBC 11.6* 15.3*  NEUTROABS 6.1  --   HGB 15.1 14.5  HCT 44.9 43.2  MCV 90.5 89.3  PLT 244 225    Basic Metabolic Panel: Recent Labs  Lab 03/07/24 1035 03/08/24 0230  NA 138 136  K 4.4 3.6  CL 102 101  CO2 24 23  GLUCOSE 221* 154*  BUN 18 14  CREATININE 1.16 1.07  CALCIUM  10.0 9.1    GFR: Estimated Creatinine Clearance: 55 mL/min (by C-G formula based on SCr of 1.07 mg/dL).  Liver Function Tests: Recent Labs  Lab 03/07/24 1035 03/08/24 0230  AST 27 23  ALT 44 35  ALKPHOS 137* 108  BILITOT 0.5 1.1  PROT 7.1 6.2*  ALBUMIN 4.4 3.4*   No results for input(s): LIPASE, AMYLASE in the last 168 hours. No results for input(s): AMMONIA in the last 168 hours.  Coagulation Profile: Recent Labs  Lab 03/07/24 1035  INR 0.9    Cardiac Enzymes: No results for input(s): CKTOTAL, CKMB, CKMBINDEX, TROPONINI in the last 168 hours.  BNP (last 3 results) No results for input(s): PROBNP in the last 8760 hours.  Lipid Profile: Recent Labs    03/08/24 0230  CHOL 121  HDL 36*  LDLCALC 44  TRIG 792*  CHOLHDL 3.4    Thyroid Function Tests: No results for input(s): TSH, T4TOTAL, FREET4, T3FREE, THYROIDAB in the last 72 hours.  Anemia Panel: No results for input(s): VITAMINB12, FOLATE, FERRITIN, TIBC, IRON, RETICCTPCT in the last 72 hours.  Urine analysis:    Component Value Date/Time   COLORURINE YELLOW 11/16/2019 1520   APPEARANCEUR CLEAR 11/16/2019 1520   LABSPEC 1.022 11/16/2019 1520   PHURINE < OR = 5.0 11/16/2019 1520   GLUCOSEU NEGATIVE 11/16/2019 1520   HGBUR NEGATIVE 11/16/2019 1520   KETONESUR NEGATIVE 11/16/2019 1520   PROTEINUR NEGATIVE 11/16/2019 1520   NITRITE NEGATIVE 11/16/2019 1520   LEUKOCYTESUR NEGATIVE 11/16/2019 1520     Sepsis Labs: Lactic Acid, Venous No results found for: LATICACIDVEN  MICROBIOLOGY: No results found for this or any previous visit (from the past 240 hours).  RADIOLOGY STUDIES/RESULTS: MR BRAIN WO CONTRAST Result Date: 03/08/2024 EXAM: MR Brain without Intravenous Contrast. CLINICAL HISTORY: 83 year old male with acute neuro deficit, stroke suspected, and age-indeterminate left PCA cortical infarct by CT. TECHNIQUE: Magnetic resonance images of the brain without intravenous contrast in multiple planes. CONTRAST: Without. COMPARISON: Head CT and CTA head and neck yesterday. Previous brain MRI 09/03/2006. FINDINGS: BRAIN: Confirmed small areas of abnormality diffusion in the lateral left occipital lobe cortex (series 5 image 70, corresponding to CT finding), also subcortical white matter of the left lateral occipital lobe on series 5 image 74. These appear mildly restricted associated T2 and FLAIR hyperintense cytotoxic edema at those areas with no hemorrhagic transformation or mass effect. No other diffusion restriction. The central arterial and venous flow voids are patent. Major  vascular flow voids appear stable from the 2008 MRI. Generalized cerebral hemisphere volume loss since 2008 appears to be age appropriate. However, cerebellar atrophy is advanced and disproportionate (sagittal image 13). No superimposed chronic cerebral blood products. There is chronic encephalomalacia along both posterior insular cortex (series 11 image 29) new from 2008. Elsewhere only mild for age T2 heterogeneity in the basal ganglia. No midline shift or extra-axial fluid collection. No intracranial mass or hemorrhage. VENTRICLES: No hydrocephalus. ORBITS: Postoperative changes to both globes since 2008. SINUSES AND MASTOIDS: The sinuses and mastoid air cells are clear. 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 No acute fracture or focal osseous lesion.  IMPRESSION: 1. Confirmed small subacute left PCA territory infarcts in the occipital lobe with No hemorrhagic transformation or mass effect. 2. Advanced cerebellar atrophy since 2008, nonspecific. 3. Otherwise mild chronic encephalomalacia along the bilateral posterior insular cortex is also new from 2008. Electronically signed by: Helayne Hurst MD 03/08/2024 06:18 AM EDT RP Workstation: [id number]   CT ANGIO HEAD NECK W WO CM (CODE STROKE) Result Date: 03/07/2024 CLINICAL DATA:  Left-sided weakness EXAM: CT ANGIOGRAPHY HEAD AND NECK WITH AND WITHOUT CONTRAST TECHNIQUE: Multidetector CT imaging of the head and neck was performed using the standard protocol during bolus administration of intravenous contrast. Multiplanar CT image reconstructions and MIPs were obtained to evaluate the vascular anatomy. Carotid stenosis measurements (when applicable) are obtained utilizing NASCET criteria, using the distal internal carotid diameter as the denominator. RADIATION DOSE REDUCTION: This exam was performed according to the departmental dose-optimization program which includes automated exposure control, adjustment of the mA and/or kV according to patient size and/or use of iterative reconstruction technique. CONTRAST:  75mL OMNIPAQUE IOHEXOL 350 MG/ML SOLN COMPARISON:  None Available. CTA NECK: CTA NECK Aortic arch: No proximal vessel stenosis. Right carotid: There is a small amount of plaque at the bifurcation without significant stenosis Left carotid: Small amount of plaque at the bifurcation without significant stenosis Right vertebral: Normal Left vertebral: Normal Soft tissues: No significant abnormality Other comments: None CTA HEAD: CTA HEAD Right anterior circulation: The internal carotid artery is patent without significant stenosis. The anterior and middle cerebral arteries are patent without significant stenosis or proximal branch occlusion. No aneurysm. Left anterior circulation: The internal carotid artery is  patent without significant stenosis. The anterior and middle cerebral arteries are patent without significant stenosis or proximal branch occlusion. No aneurysm. Posterior circulation: Both vertebral arteries are patent. 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 Both posterior cerebral arteries are patent. IMPRESSION: 1. No extracranial carotid artery stenosis 2. Severe mid basilar artery stenosis 3. No proximal vessel occlusion in the anterior circulation Electronically Signed   By: Nancyann Burns M.D.   On: 03/07/2024 11:52   CT HEAD CODE STROKE WO CONTRAST Result Date: 03/07/2024 CLINICAL DATA:  Code stroke. Provided history: Neuro deficit, acute, stroke suspected. Slurred speech. Left-sided weakness. EXAM: CT HEAD WITHOUT CONTRAST TECHNIQUE: Contiguous axial images were obtained from the base of the skull through the vertex without intravenous contrast. RADIATION DOSE REDUCTION: This exam was performed according to the departmental dose-optimization program which includes automated exposure control, adjustment of the mA and/or kV according to patient size and/or use of iterative reconstruction technique. COMPARISON:  Brain MRI 09/03/2006. FINDINGS: Brain: Generalized cerebral and cerebellar atrophy. Small age-indeterminate cortical infarct within the left occipital lobe (PCA vascular territory) (series 5, image 14). Patchy and ill-defined hypoattenuation within the cerebral white matter, nonspecific but compatible with mild chronic small vessel  ischemic disease. Small chronic appearing lacunar infarct within the right lentiform nucleus. There is no acute intracranial hemorrhage. No extra-axial fluid collection. No evidence of an intracranial mass. No midline shift. Vascular: No hyperdense vessel.  Atherosclerotic calcifications. Skull: No calvarial fracture or aggressive osseous lesion. Sinuses/Orbits: No mass or acute finding within the imaged orbits. No significant paranasal sinus disease at the imaged levels.  ASPECTS ([place]berta Stroke Program Early CT Score) - Ganglionic level infarction (caudate, lentiform nuclei, internal capsule, insula, M1-M3 cortex): 7 - Supraganglionic infarction (M4-M6 cortex): 3 Total score (0-10 with 10 being normal): 10 (when discounting a chronic right basal ganglia lacunar infarct). These results were called by telephone at the time of interpretation on 03/07/2024 at 10:38 am to provider JOSHUA LONG , who verbally acknowledged these results. IMPRESSION: 1. Small age-indeterminate cortical infarct within the left occipital lobe (PCA vascular territory). A brain MRI may be obtained for further evaluation, as clinically warranted. 2. 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 3. Background parenchymal atrophy and mild cerebral white matter chronic small vessel ischemic disease. Electronically Signed   By: Rockey Childs D.O.   On: 03/07/2024 10:41     LOS: 0 days   Donalda Applebaum, MD  [facility]    To contact the attending provider between 7A-7P or the covering provider during after hours 7P-7A, please log into the web site [email or web address] and access using universal Bieber password for that web site. If you do not have the password, please call the hospital operator.  03/08/2024, 3:23 PM

## 2024-03-08 NOTE — Progress Notes (Signed)
 30 day heart monitor ordered for cva. To be mailed to home. Nishan to read.

## 2024-03-08 NOTE — Consult Note (Signed)
 NEUROLOGY CONSULT NOTE   Date of service: March 08, 2024 Patient Name: Justin [name] MRN:  [id number] DOB:  Mar 06, 1941 Chief Complaint: episode of L sided weakness and facial droop with slurred speech Requesting Provider: Charlton Evalene RAMAN, MD  History of Present Illness  TIMATHY NEWBERRY is a 83 y.o. 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  Patient got up earlier in the day and reports that he was trying to pull himself up at around 8 AM in the morning on 03/07/2024 when his left arm suddenly gave out.  Caretaker noted that his speech was worse and he had a left facial droop.  Patient initially presented to [facility] ED where CT head without contrast was negative for acute intracranial normalities.  CT of the head and neck demonstrated severe focal mid basilar artery stenosis with good distal flow.  Patient denies any prior similar episodes, he denies any history of strokes, no family history of stroke, denies any history of diabetes.  He endorses a history of hypertension and hyperlipidemia.  LKW: 0800 on 03/07/2024 Modified rankin score: 4-Needs assistance to walk and tend to bodily needs IV Thrombolysis: Not offered, spontaneous and full resolution of symptoms.   EVT: Not offered, no LVO and spontaneous and full resolution of symptoms.    NIHSS components Score: Comment  1a Level of Conscious 0[]  1[]  2[]  3[]      1b LOC Questions 0[]  1[]  2[]       1c LOC Commands 0[]  1[]  2[]       2 Best Gaze 0[]  1[]  2[]       3 Visual 0[]  1[]  2[]  3[]      4 Facial Palsy 0[]  1[]  2[]  3[]      5a Motor Arm - left 0[]  1[]  2[]  3[]  4[]  UN[]    5b Motor Arm - Right 0[]  1[]  2[]  3[]  4[]  UN[]    6a Motor Leg - Left 0[]  1[]  2[]  3[]  4[]  UN[]    6b Motor Leg - Right 0[]  1[]  2[]  3[]  4[]  UN[]    7 Limb Ataxia 0[]  1[]  2[x]  UN[]      8 Sensory 0[]  1[]  2[]  UN[]      9 Best  Language 0[]  1[]  2[]  3[]      10 Dysarthria 0[]  1[]  2[]  UN[]      11 Extinct. 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   Diaphragmatic hernia without mention of obstruction or gangrene 1982   Hiatal hernia   Diplopia 2006   Disturbance of skin sensation 2003   Diverticulosis of colon (without mention of hemorrhage) 1997   Dizziness and giddiness 2001   Dysphagia, unspecified(787.20) 1999 and   Esophagitis, unspecified 1997   GERD (gastroesophageal reflux disease) 2003   Hypertension 2003   Impotence of organic origin 1999   Lack of coordination September 07, 2007   Lumbago 2003  Migraine with aura, without mention of intractable migraine without mention of status migrainosus 1997   Myalgia and myositis, unspecified 1999   Other and unspecified hyperlipidemia 2003   Other disorders of vitreous 2003   Other malaise and fatigue 2003   Other seborrheic keratosis 2013   Other seborrheic keratosis 04/13/09   Pain in joint, site unspecified 04/13/12   Personal history of fall 04/15/11   Restless legs syndrome (RLS) 1997   Spinocerebellar disease, 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 Ebbie   C3-4 anterior cervical surgery  1999   Dr. Carles   CARDIAC CATHETERIZATION  2001   Dr. Bulah   COLONOSCOPY  07/20/07   Dr. Jakie   EYE SURGERY Bilateral 2015   cataracts   LAMINOTOMY  / EXCISION DISK POSTERIOR CERVICAL SPINE     Spinal Disk (?4-5)    Family History: Family History  Problem Relation Age of Onset   Heart disease Father        SCA with multiple family members with SCA   Diabetes Neg Hx    Colon cancer Neg Hx    Colon polyps Neg Hx    Esophageal cancer Neg Hx    Kidney disease Neg Hx    Gallbladder disease Neg Hx     Social History  reports that he has never smoked. He quit smokeless tobacco use about 44 years ago.  His smokeless tobacco use included chew. He reports that he does not currently use alcohol. He reports that he does not use drugs.  Allergies  Allergen Reactions   Effexor [Venlafaxine Hcl]    Serzone [Nefazodone]    Vivactil [Protriptyline Hcl]     Medications   Current Facility-Administered Medications:     stroke: early stages of recovery book, , Does not apply, Once, Opyd, Timothy S, MD   0.9 %  sodium chloride infusion, , Intravenous, Continuous, Opyd, Evalene RAMAN, MD   acetaminophen  (TYLENOL ) tablet 650 mg, 650 mg, Oral, Q4H PRN, 650 mg at 03/08/24 0020 **OR** acetaminophen  (TYLENOL ) 160 MG/5ML solution 650 mg, 650 mg, Per Tube, Q4H PRN **OR** acetaminophen  (TYLENOL ) suppository 650 mg, 650 mg, Rectal, Q4H PRN, Opyd, Evalene RAMAN, MD   aspirin  EC tablet 81 mg, 81 mg, Oral, Daily, Opyd, Timothy S, MD   atorvastatin  (LIPITOR) tablet 10 mg, 10 mg, Oral, Daily, Long, Joshua G, MD, 10 mg at 03/07/24 1527   enoxaparin  (LOVENOX ) injection 40 mg, 40 mg, Subcutaneous, Q24H, Opyd, Timothy S, MD, 40 mg at 03/08/24 0020   insulin aspart (novoLOG) injection 0-5 Units, 0-5 Units, Subcutaneous, QHS, Opyd, Timothy S, MD   insulin aspart (novoLOG) injection 0-6 Units, 0-6 Units, Subcutaneous, TID WC, Opyd, Timothy S, MD   pantoprazole  (PROTONIX ) EC tablet 40 mg, 40 mg, Oral, Daily, Opyd, Timothy S, MD   pregabalin  (LYRICA ) capsule 25 mg, 25 mg, Oral, TID, Opyd, Timothy S, MD, 25 mg at 03/08/24 0021   senna-docusate (Senokot-S) tablet 1 tablet, 1  tablet, Oral, QHS PRN, Charlton Evalene RAMAN, MD  Vitals   Vitals:   03/07/24 1930 03/07/24 1950 03/07/24 2036 03/07/24 2344  BP: (!) 146/71  (!) 154/77 (!) 141/69  Pulse: 76 82 78   Resp: 20 (!) 21 20   Temp:   98.6 F (37 C) 98.6 F (37 C)  TempSrc:  Oral Oral  SpO2: 94% 94%    Weight:   82.1 kg   Height:   5' 10 (1.778 m)     Body mass index is 25.97 kg/m.   Physical Exam   General: Laying comfortably in bed; in no acute distress.  HENT: Normal oropharynx and mucosa. Normal external appearance of ears and nose.  Neck: Supple, no pain or tenderness  CV: No JVD. No peripheral edema.  Pulmonary: Symmetric Chest rise. Normal respiratory effort.  Abdomen: Soft to touch, non-tender.  Ext: No cyanosis, edema, or deformity  Skin: No rash. Normal palpation of skin.   Musculoskeletal: Normal digits and nails by inspection. No clubbing.   Neurologic Examination  Mental status/Cognition: Alert, oriented to self, place, month and year, good attention.  Speech/language: Ataxic speech, non-fluent, comprehension intact, object naming intact, repetition intact.  Cranial nerves:   CN II Pupils equal and reactive to light, no VF deficits    CN III,IV,VI EOM intact, no gaze preference or deviation, no nystagmus    CN V normal sensation in V1, V2, and V3 segments bilaterally    CN VII no asymmetry, no nasolabial fold flattening    CN VIII normal hearing to speech    CN IX & X normal palatal elevation, no uvular deviation    CN XI 5/5 head turn and 5/5 shoulder shrug bilaterally    CN XII midline tongue protrusion    Motor:  Muscle bulk: Normal, tone normal, ataxic tremor noted in bilateral upper extremities. Mvmt Root Nerve  Muscle Right Left Comments  SA C5/6 Ax Deltoid 5 5   EF C5/6 Mc Biceps 5 5   EE C6/7/8 Rad Triceps 5 5   WF C6/7 Med FCR     WE C7/8 PIN ECU     F Ab C8/T1 U ADM/FDI 5 5   HF L1/2/3 Fem Illopsoas 4+ 4+   KE L2/3/4 Fem Quad 5 5   DF L4/5 D Peron Tib Ant 5 5    PF S1/2 Tibial Grc/Sol 5 5    Sensation:  Light touch Intact throughout   Pin prick    Temperature    Vibration   Proprioception    Coordination/Complex Motor:  - Finger to Nose ataxia bilaterally - Heel to shin ataxic bilaterally - Rapid alternating movement are slowed - Gait: Deferred for patient safety. Labs/Imaging/Neurodiagnostic studies   CBC:  Recent Labs  Lab 03-12-24 1035  WBC 11.6*  NEUTROABS 6.1  HGB 15.1  HCT 44.9  MCV 90.5  PLT 244   Basic Metabolic Panel:  Lab Results  Component Value Date   NA 138 03/12/2024   K 4.4 03/12/2024   CO2 24 2024/03/12   GLUCOSE 221 (H) 03/12/24   BUN 18 03-12-2024   CREATININE 1.16 03-12-24   CALCIUM  10.0 2024/03/12   GFRNONAA >60 2024/03/12   GFRAA 79 04/19/2020   Lipid Panel:  Lab Results  Component Value Date   LDLCALC 62 06/09/2023   HgbA1c:  Lab Results  Component Value Date   HGBA1C 7.1 (H) 04/16/2023   Urine Drug Screen: No results found for: LABOPIA, COCAINSCRNUR, LABBENZ, AMPHETMU, THCU, LABBARB  Alcohol Level     Component Value Date/Time   [facility] <15 2024/03/12 1035   INR  Lab Results  Component Value Date   INR 0.9 03-12-24   APTT  Lab Results  Component Value Date   APTT 27 03-12-24   AED levels: No results found for: PHENYTOIN, ZONISAMIDE, LAMOTRIGINE, LEVETIRACETA  CT Head  without contrast(Personally reviewed): CTH was negative for a large hypodensity concerning for a large territory infarct or hyperdensity concerning for an ICH  CT angio Head and Neck with contrast(Personally reviewed): Mid basilar focal stenosis with good distal flow.  MRI Brain(Personally reviewed): Pending  ASSESSMENT   ARDIT DANH is a 83 y.o. male with SCA 6 and baseline ataxia, history of hypertension hyperlipidemia who presents with a episode of left arm weakness along with left facial droop and worsening of his baseline ataxic speech.  Episodes resolved spontaneously in  about an hour.  The description of the episode is very concerning for TIA.  RECOMMENDATIONS  - Frequent Neuro checks per stroke unit protocol - Recommend brain imaging with MRI Brain without contrast - Recommend obtaining TTE  - Recommend obtaining Lipid panel with LDL - Please start statin if LDL > 70 - Recommend HbA1c to evaluate for diabetes and how well it is controlled. - Antithrombotic -aspirin  81 mg daily along with Plavix 75 mg daily for 21 days, followed by Plavix 75 mg daily alone. - Recommend DVT ppx - SBP goal - permissive hypertension first 24 h < 220/110. Held home meds.  - Recommend Telemetry monitoring for arrythmia - Recommend bedside swallow screen prior to PO intake. - Stroke education booklet - Recommend PT/OT/SLP consult  ______________________________________________________________________  Plan was discussed with Dr. Charlton with the hospitalist team over secure chat.  Signed, Darrell Leonhardt, MD [facility]

## 2024-03-08 NOTE — Evaluation (Signed)
 Physical Therapy Evaluation Patient Details Name: Justin [name] MRN: [id number] DOB: 26-Jan-1941 Today's Date: 03/08/2024  History of Present Illness  Justin [name] is a 83 y.o. male who presents with episode of slurred speech, left arm weakness and a left facial droop. 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 Pt today was able ambulate in hallway with RW. Pt with very ataxic gait pattern which is his baseline. Mostly Min A however up to +2 Max A with turning and pt required constant cues for proximity to RW. PTA pt reports that he has a 24 hour caregiver at home who assisted with some ambulation and all ADLs. Recommend HHPT upon DC. PT will continue to follow.          If plan is discharge home, recommend the following: A lot of help with walking and/or transfers;A little help with bathing/dressing/bathroom;Assist for transportation;Help with stairs or ramp for entrance   Can travel by private vehicle        Equipment Recommendations None recommended by PT  Recommendations for Other Services       Functional Status Assessment Patient has had a recent decline in their functional status and demonstrates the ability to make significant improvements in function in a reasonable and predictable amount of time.     Precautions / Restrictions Precautions Precautions: Fall;Other (comment) Recall of Precautions/Restrictions: Intact Precaution/Restrictions Comments: Baseline ataxia Restrictions Weight Bearing Restrictions Per Provider Order: No      Mobility  Bed Mobility Overal bed mobility: Modified Independent                  Transfers Overall transfer level: Needs assistance Equipment used: Rolling walker (2 wheels) Transfers: Sit to/from Stand Sit to Stand: Supervision           General transfer comment: no physical assistance required.     Ambulation/Gait Ambulation/Gait assistance: +2 physical assistance, +2 safety/equipment, Min assist, Mod assist, Max assist Gait Distance (Feet): 50 Feet Assistive device: Rolling walker (2 wheels) Gait Pattern/deviations: Ataxic, Trunk flexed, Decreased stride length, Step-through pattern Gait velocity: decreased     General Gait Details: Pt with very ataxic gait pattern which is his baseline. Mostly Min A however up to +2 Max A with turning and pt required constant cues for proximity to RW.  Stairs            Wheelchair Mobility     Tilt Bed    Modified Rankin (Stroke Patients Only)       Balance Overall balance assessment: Needs assistance Sitting-balance support: Bilateral upper extremity supported, Feet supported Sitting balance-Leahy Scale: Good     Standing balance support: Bilateral upper extremity supported, Reliant on assistive device for balance, During functional activity Standing balance-Leahy Scale: Poor Standing balance comment: Reliant on RW                             Pertinent Vitals/Pain Pain Assessment Pain Assessment: 0-10 Pain Score: 5  Pain Location: Headache Pain Descriptors / Indicators: Headache Pain Intervention(s): Limited activity within patient's tolerance, Monitored during session, Repositioned    Home Living Family/patient expects to be discharged to:: Private residence Living Arrangements: Other (Comment) (Caregiver) Available Help at Discharge: Personal care attendant;Available 24 hours/day Type of Home: House Home Access: Ramped entrance       Home Layout: One level Home Equipment: Rolling  Walker (2 wheels);Wheelchair - manual;Grab bars - toilet;Grab bars - tub/shower      Prior Function Prior Level of Function : Needs assist             Mobility Comments: Pt reports that he mostly uses RW and has 24 assistance from caregiver. ADLs Comments: Caregiver assists     Extremity/Trunk Assessment    Upper Extremity Assessment Upper Extremity Assessment: Overall WFL for tasks assessed    Lower Extremity Assessment Lower Extremity Assessment: Overall WFL for tasks assessed    Cervical / Trunk Assessment Cervical / Trunk Assessment: Kyphotic  Communication   Communication Communication: Impaired Factors Affecting Communication: Difficulty expressing self;Reduced clarity of speech (Baseline)    Cognition Arousal: Alert Behavior During Therapy: WFL for tasks assessed/performed   PT - Cognitive impairments: No apparent impairments                         Following commands: Intact       Cueing Cueing Techniques: Verbal cues, Tactile cues     General Comments General comments (skin integrity, edema, etc.): VSS    Exercises     Assessment/Plan    PT Assessment Patient needs continued PT services  PT Problem List Decreased strength;Decreased range of motion;Decreased activity tolerance;Decreased balance;Decreased mobility;Decreased coordination;Decreased cognition;Decreased knowledge of use of DME;Decreased safety awareness;Decreased knowledge of precautions;Cardiopulmonary status limiting activity       PT Treatment Interventions DME instruction;Gait training;Stair training;Functional mobility training;Therapeutic activities;Balance training;Therapeutic exercise;Neuromuscular re-education;Cognitive remediation;Patient/family education    PT Goals (Current goals can be found in the Care Plan section)  Acute Rehab PT Goals Patient Stated Goal: to go home PT Goal Formulation: With patient Time For Goal Achievement: 03/22/24 Potential to Achieve Goals: Good    Frequency Min 3X/week     Co-evaluation               AM-PAC PT 6 Clicks Mobility  Outcome Measure Help needed turning from your back to your side while in a flat bed without using bedrails?: A Little Help needed moving from lying on your back to sitting on the side of a flat bed without  using bedrails?: A Little Help needed moving to and from a bed to a chair (including a wheelchair)?: A Little Help needed standing up from a chair using your arms (e.g., wheelchair or bedside chair)?: A Little Help needed to walk in hospital room?: A Lot Help needed climbing 3-5 steps with a railing? : Total 6 Click Score: 15    End of Session Equipment Utilized During Treatment: Gait belt Activity Tolerance: Patient tolerated treatment well Patient left: in chair;with call bell/phone within reach;with chair alarm set Nurse Communication: Mobility status PT Visit Diagnosis: Other abnormalities of gait and mobility (R26.89)    Time: 8887-8860 PT Time Calculation (min) (ACUTE ONLY): 27 min   Charges:   PT Evaluation $PT Eval High Complexity: 1 High PT Treatments $Gait Training: 8-22 mins PT General Charges $$ ACUTE PT VISIT: 1 Visit         Sueellen NOVAK, PT, DPT Acute Rehab Services [phone]   Britanny Marksberry 03/08/2024, 12:31 PM

## 2024-03-08 NOTE — Care Management Obs Status (Signed)
 MEDICARE OBSERVATION STATUS NOTIFICATION   Patient Details  Name: Justin [name] MRN: [id number] Date of Birth: Dec 25, 1940   Medicare Observation Status Notification Given:  Yes  Verbally reviewed observation notice with Justin [name] telephonically at [phone].  Will deliver a copy to the patients room.  Justin [name] 03/08/2024, 9:42 AM

## 2024-03-09 ENCOUNTER — Other Ambulatory Visit (HOSPITAL_COMMUNITY): Payer: Self-pay

## 2024-03-09 DIAGNOSIS — I739 Peripheral vascular disease, unspecified: Secondary | ICD-10-CM | POA: Diagnosis not present

## 2024-03-09 DIAGNOSIS — E119 Type 2 diabetes mellitus without complications: Secondary | ICD-10-CM | POA: Diagnosis not present

## 2024-03-09 DIAGNOSIS — R29818 Other symptoms and signs involving the nervous system: Secondary | ICD-10-CM | POA: Diagnosis not present

## 2024-03-09 DIAGNOSIS — I10 Essential (primary) hypertension: Secondary | ICD-10-CM | POA: Diagnosis not present

## 2024-03-09 LAB — GLUCOSE, CAPILLARY: Glucose-Capillary: 157 mg/dL — ABNORMAL HIGH (ref 70–99)

## 2024-03-09 MED ORDER — ASPIRIN 81 MG PO TBEC
81.0000 mg | DELAYED_RELEASE_TABLET | Freq: Every day | ORAL | 0 refills | Status: AC
  Filled 2024-03-09: qty 21, 21d supply, fill #0

## 2024-03-09 MED ORDER — CLOPIDOGREL BISULFATE 75 MG PO TABS
75.0000 mg | ORAL_TABLET | Freq: Every day | ORAL | 11 refills | Status: DC
  Filled 2024-03-09: qty 30, 30d supply, fill #0

## 2024-03-09 NOTE — Progress Notes (Signed)
 Justin [name] to be discharged home per MD order. Discussed with the patient and caregiver Justin [name] and all questions fully answered.  Skin clean, dry, and intact without evidence of skin break down. IV catheter discontinued intact. Site without signs and symptoms of complications. Dressing and pressure applied.  An After Visit Summary was printed and given to the patient.  Patient escorted via [facility], and discharged home via private auto.  Justin [name]  03/09/2024

## 2024-03-09 NOTE — Plan of Care (Signed)
  Problem: Education: Goal: Knowledge of General Education information will improve Description: Including pain rating scale, medication(s)/side effects and non-pharmacologic comfort measures Outcome: Progressing   Problem: Health Behavior/Discharge Planning: Goal: Ability to manage health-related needs will improve Outcome: Progressing   Problem: Clinical Measurements: Goal: Will remain free from infection Outcome: Progressing Goal: Diagnostic test results will improve Outcome: Progressing   Problem: Activity: Goal: Risk for activity intolerance will decrease Outcome: Progressing   Problem: Nutrition: Goal: Adequate nutrition will be maintained Outcome: Progressing   Problem: Coping: Goal: Level of anxiety will decrease Outcome: Progressing   Problem: Safety: Goal: Ability to remain free from injury will improve Outcome: Progressing   Problem: Coping: Goal: Ability to adjust to condition or change in health will improve Outcome: Progressing   Problem: Ischemic Stroke/TIA Tissue Perfusion: Goal: Complications of ischemic stroke/TIA will be minimized Outcome: Progressing   Problem: Coping: Goal: Will identify appropriate support needs Outcome: Progressing

## 2024-03-09 NOTE — Discharge Summary (Signed)
 PATIENT DETAILS Name: Justin Justin [name] Age: 83 y.o. Sex: male Date of Birth: 1940/07/05 MRN: [id number]. Admitting Physician: Justin GORMAN Sprinkles, MD ERE:Zlajwxd, Justin POUR, NP  Admit Date: 03/07/2024 Discharge date: 03/09/2024  Recommendations for Outpatient Follow-up:  Follow up with PCP in 1-2 weeks Please obtain CMP/CBC in one week Please ensure follow up with cardiology and neurology.  Admitted From:  Home  Disposition: Home health   Discharge Condition: good  CODE STATUS:   Code Status: Limited: Do not attempt resuscitation (DNR) -DNR-LIMITED -Do Not Intubate/DNI    Diet recommendation:  Diet Order             Diet - low sodium heart healthy           Diet Carb Modified           Diet regular Fluid consistency: Thin  Diet effective now                    Brief Summary: Patient is a 83 y.o.  male with history of spinocerebellar ataxia, abdominal aortic dissection managed medically, HTN, DM-2-who presented with transient left arm weakness/dysarthria.  Upon further evaluation with MRI imaging he was found to have acute CVA.   Significant events: 10/7>> admit to [facility].   Significant studies: 10/7>> CT angio head/neck: Justin [name] extracranial carotid artery stenosis, severe mid basilar artery stenosis. 10/7>> MRI brain: Small subacute left PCA territory infarct in the occipital lobe. 10/7>> LDL: 44 10/7>> A1c: 6.7 10/8>> echo: EF 70-75%   Significant microbiology data: None   Procedures: None   Consults: Neurology  Brief Hospital Course: Acute ischemic CVA Presenting focal deficits have resolved Discussed with neurology-suspected embolic etiology Workup as above Recommendations from Justin Justin [name] x 3 weeks followed by Plavix alone, outpatient 30-day cardiac monitoring (epic message sent to cardiology team-per cardiology team coordinator-Justin Justin [name]-office will mail it to the patient.) Continue statin on discharge. Evaluated by PT/OT-Home  health recommended.   DM-2 Diet controlled at home Follow with PCP   PAD Continue antiplatelet/statin   History of dissection of abdominal aortic aorta Chronic issue-followed by vascular surgery and manage medically   Spinocerebellar ataxia Followed by neurology Lives with 24/7 care at home-ambulates with help of a walker.   HTN Initially permissive hypertension was allowed Resume usual antihypertensives on discharge.  Discharge Diagnoses:  Principal Problem:   Acute focal neurological deficit Active Problems:   Essential hypertension   Dissection of abdominal aorta (HCC)   Spinocerebellar ataxia type 6 (HCC)   PAD (peripheral artery disease)   Non-insulin dependent type 2 diabetes mellitus (HCC)   Discharge Instructions:  Activity:  As tolerated with Full fall precautions use walker/cane & assistance as needed  Discharge Instructions     Diet - low sodium heart healthy   Complete by: As directed    Diet Carb Modified   Complete by: As directed    Discharge instructions   Complete by: As directed    Follow with Primary MD  Justin Justin POUR, NP in 1-2 weeks  Follow-up with your primary neurologist-Justin Justin [name] in 2 weeks  Cardiology will mail you an outpatient 30-day cardiac monitor-he will subsequently follow-up with cardiology-if you do not hear from cardiology please give them a call.  Take aspirin  along with Plavix for 21 days, after 21 days stop aspirin  and continue on Plavix.  Please get a complete blood count and chemistry panel checked by your Primary MD at your next visit, and again as instructed by your  Primary MD.  Get Medicines reviewed and adjusted: Please take all your medications with you for your next visit with your Primary MD  Laboratory/radiological data: Please request your Primary MD to go over all hospital tests and procedure/radiological results at the follow up, please ask your Primary MD to get all Hospital records sent to his/her  office.  In some cases, they will be blood work, cultures and biopsy results pending at the time of your discharge. Please request that your primary care M.D. follows up on these results.  Also Note the following: If you experience worsening of your admission symptoms, develop shortness of breath, life threatening emergency, suicidal or homicidal thoughts you must seek medical attention immediately by calling 911 or calling your MD immediately  if symptoms less severe.  You must read complete instructions/literature along with all the possible adverse reactions/side effects for all the Medicines you take and that have been prescribed to you. Take any new Medicines after you have completely understood and accpet all the possible adverse reactions/side effects.   Do not drive when taking Pain medications or sleeping medications (Benzodaizepines)  Do not take more than prescribed Pain, Sleep and Anxiety Medications. It is not advisable to combine anxiety,sleep and pain medications without talking with your primary care practitioner  Special Instructions: If you have smoked or chewed Tobacco  in the last 2 yrs please stop smoking, stop any regular Alcohol  and or any Recreational drug use.  Wear Seat belts while driving.  Please note: You were cared for by a hospitalist during your hospital stay. Once you are discharged, your primary care physician will handle any further medical issues. Please note that Justin [name] REFILLS for any discharge medications will be authorized once you are discharged, as it is imperative that you return to your primary care physician (or establish a relationship with a primary care physician if you do not have one) for your post hospital discharge needs so that they can reassess your need for medications and monitor your lab values.   Increase activity slowly   Complete by: As directed       Allergies as of 03/09/2024       Reactions   Effexor [venlafaxine Hcl]    unknown    Serzone [nefazodone]    unknown   Vivactil [protriptyline Hcl]    unknown        Medication List     TAKE these medications    acetaminophen  650 MG CR tablet Commonly known as: Tylenol  8 Hour Arthritis Pain Take 1 tablet (650 mg total) by mouth 2 (two) times daily.   amLODipine  5 MG tablet Commonly known as: NORVASC  TAKE 1 TABLET BY MOUTH EVERY DAY   aspirin  EC 81 MG tablet Take 1 tablet (81 mg total) by mouth daily for 21 days. Swallow whole. Start taking on: March 10, 2024   atorvastatin  10 MG tablet Commonly known as: LIPITOR TAKE 1 TABLET BY MOUTH EVERY DAY   clopidogrel 75 MG tablet Commonly known as: Plavix Take 1 tablet (75 mg total) by mouth daily.   hydrochlorothiazide  12.5 MG tablet Commonly known as: HYDRODIURIL  TAKE 1 TABLET BY MOUTH EVERY DAY   losartan  100 MG tablet Commonly known as: COZAAR  Take 1 tablet (100 mg total) by mouth daily.   Myrbetriq  25 MG Tb24 tablet Generic drug: mirabegron  ER TAKE 1 TABLET (25 MG TOTAL) BY MOUTH DAILY.   pantoprazole  40 MG tablet Commonly known as: PROTONIX  TAKE 1 TABLET BY MOUTH EVERY DAY  potassium chloride  10 MEQ tablet Commonly known as: KLOR-CON  TAKE 1 TABLET BY MOUTH EVERY DAY   pregabalin  25 MG capsule Commonly known as: LYRICA  TAKE 1 CAPSULE BY MOUTH THREE TIMES A DAY What changed: See the new instructions.   PROBIOTIC DAILY PO Take 1 tablet by mouth daily.   propranolol  40 MG tablet Commonly known as: INDERAL  TAKE 1 TABLET BY MOUTH TWICE A DAY        Follow-up Information     Justin Justin POUR, NP. Schedule an appointment as soon as possible for a visit in 1 week(s).   Specialty: Geriatric Medicine Contact information: [address] [place] [address] [phone]         [name], Asberry RAMAN, DO. Schedule an appointment as soon as possible for a visit in 2 week(s).   Specialty: Neurology Contact information: [address] [place]  [address] [place]  [address] [phone]         Devora Prom D, NP Follow up on 05/03/2024.   Specialty: Cardiology Why: appt at 10:30 am Contact information: [address] [place] [address] [phone]                Allergies  Allergen Reactions   Effexor [Venlafaxine Hcl]     unknown   Serzone [Nefazodone]     unknown   Vivactil [Protriptyline Hcl]     unknown     Other Procedures/Studies: ECHOCARDIOGRAM COMPLETE Result Date: 03/08/2024    ECHOCARDIOGRAM REPORT   Patient Name:   Justin Justin [name] Date of Exam: 03/08/2024 Medical Rec #:  [id number]       Height:       70.0 in Accession #:    [id number]      Weight:       181.0 lb Date of Birth:  [birth year]       BSA:          2.001 m Patient Age:    82 years        BP:           150/77 mmHg Patient Gender: M               HR:           60 bpm. Exam Location:  Inpatient Procedure: 2D Echo, Cardiac Doppler, Color Doppler and Saline Contrast Bubble            Study (Both Spectral and Color Flow Doppler were utilized during            procedure). Indications:    TIA  History:        Patient has prior history of Echocardiogram examinations, most                 recent 04/19/2020. Signs/Symptoms:Altered Mental Status; Risk                 Factors:Hypertension and Dyslipidemia. Abdominal aortic                 dissection.  Sonographer:    Ellouise Devora RDCS Referring Phys: [id number] TIMOTHY S OPYD  Sonographer Comments: Technically difficult study due to poor echo windows and Justin [name] subcostal window. IMPRESSIONS  1. Left ventricular ejection fraction, by estimation, is 70 to 75%. The left ventricle has hyperdynamic function. The left ventricle has Justin [name] regional wall motion abnormalities. There is mild left ventricular hypertrophy. Left ventricular diastolic parameters are consistent with Grade I diastolic dysfunction (impaired relaxation).  2. Right ventricular systolic  function is normal. The right ventricular size is normal. Tricuspid regurgitation signal is  inadequate for assessing PA pressure.  3. The mitral valve is normal in structure. Justin [name] evidence of mitral valve regurgitation. Justin [name] evidence of mitral stenosis.  4. The aortic valve is tricuspid. Aortic valve regurgitation is not visualized. Justin [name] aortic stenosis is present.  5. Agitated saline contrast bubble study was negative, with Justin [name] evidence of any interatrial shunt. Comparison(s): A prior study was performed on 04/19/2020. Justin [name] significant change from prior study. FINDINGS  Left Ventricle: Left ventricular ejection fraction, by estimation, is 70 to 75%. The left ventricle has hyperdynamic function. The left ventricle has Justin [name] regional wall motion abnormalities. The left ventricular internal cavity size was normal in size. There is mild left ventricular hypertrophy. Left ventricular diastolic parameters are consistent with Grade I diastolic dysfunction (impaired relaxation). Right Ventricle: The right ventricular size is normal. Justin [name] increase in right ventricular wall thickness. Right ventricular systolic function is normal. Tricuspid regurgitation signal is inadequate for assessing PA pressure. Left Atrium: Left atrial size was normal in size. Right Atrium: Right atrial size was normal in size. Pericardium: Trivial pericardial effusion is present. Mitral Valve: The mitral valve is normal in structure. Justin [name] evidence of mitral valve regurgitation. Justin [name] evidence of mitral valve stenosis. Tricuspid Valve: The tricuspid valve is normal in structure. Tricuspid valve regurgitation is not demonstrated. Justin [name] evidence of tricuspid stenosis. Aortic Valve: The aortic valve is tricuspid. Aortic valve regurgitation is not visualized. Justin [name] aortic stenosis is present. Pulmonic Valve: The pulmonic valve was normal in structure. Pulmonic valve regurgitation is not visualized. Justin [name] evidence of pulmonic stenosis. Aorta: The aortic root and ascending aorta are structurally normal, with Justin [name] evidence of dilitation. Venous: The inferior vena cava was not  well visualized. IAS/Shunts: Justin [name] atrial level shunt detected by color flow Doppler. Agitated saline contrast was given intravenously to evaluate for intracardiac shunting. Agitated saline contrast bubble study was negative, with Justin [name] evidence of any interatrial shunt.  LEFT VENTRICLE PLAX 2D LVIDd:         4.35 cm     Diastology LVIDs:         2.15 cm     LV e' medial:    4.79 cm/s LV PW:         0.93 cm     LV E/e' medial:  15.5 LV IVS:        1.00 cm     LV e' lateral:   6.20 cm/s LVOT diam:     2.00 cm     LV E/e' lateral: 12.0 LV SV:         95 LV SV Index:   47 LVOT Area:     3.14 cm LV IVRT:       70 msec  LV Volumes (MOD) LV vol d, MOD A2C: 55.5 ml LV vol d, MOD A4C: 44.0 ml LV vol s, MOD A2C: 15.7 ml LV vol s, MOD A4C: 13.8 ml LV SV MOD A2C:     39.8 ml LV SV MOD A4C:     44.0 ml LV SV MOD BP:      35.0 ml RIGHT VENTRICLE RV S prime:     17.40 cm/s TAPSE (M-mode): 2.9 cm LEFT ATRIUM             Index LA diam:        3.59 cm 1.79 cm/m LA Vol (A2C):   27.6 ml 13.80 ml/m LA Vol (A4C):   24.5 ml 12.25 ml/m  LA Biplane Vol: 27.4 ml 13.70 ml/m  AORTIC VALVE LVOT Vmax:   147.00 cm/s LVOT Vmean:  98.500 cm/s LVOT VTI:    0.301 m  AORTA Ao Root diam: 3.01 cm Ao Asc diam:  3.52 cm MITRAL VALVE MV Area (PHT): 3.27 cm    SHUNTS MV Decel Time: 232 msec    Systemic VTI:  0.30 m MV E velocity: 74.40 cm/s  Systemic Diam: 2.00 cm MV A velocity: 83.75 cm/s MV E/A ratio:  0.89 Emeline Calender Electronically signed by Emeline Calender Signature Date/Time: 03/08/2024/5:11:17 PM    Final    MR BRAIN WO CONTRAST Result Date: 03/08/2024 EXAM: MR Brain without Intravenous Contrast. CLINICAL HISTORY: 83 year old male with acute neuro deficit, stroke suspected, and age-indeterminate left PCA cortical infarct by CT. TECHNIQUE: Magnetic resonance images of the brain without intravenous contrast in multiple planes. CONTRAST: Without. COMPARISON: Head CT and CTA head and neck yesterday. Previous brain MRI 09/03/2006. FINDINGS: BRAIN: Confirmed  small areas of abnormality diffusion in the lateral left occipital lobe cortex (series 5 image 70, corresponding to CT finding), also subcortical white matter of the left lateral occipital lobe on series 5 image 74. These appear mildly restricted associated T2 and FLAIR hyperintense cytotoxic edema at those areas with Justin [name] hemorrhagic transformation or mass effect. Justin [name] other diffusion restriction. The central arterial and venous flow voids are patent. Major vascular flow voids appear stable from the 2008 MRI. Generalized cerebral hemisphere volume loss since 2008 appears to be age appropriate. However, cerebellar atrophy is advanced and disproportionate (sagittal image 13). Justin [name] superimposed chronic cerebral blood products. There is chronic encephalomalacia along both posterior insular cortex (series 11 image 29) new from 2008. Elsewhere only mild for age T2 heterogeneity in the basal ganglia. Justin [name] midline shift or extra-axial fluid collection. Justin [name] intracranial mass or hemorrhage. VENTRICLES: Justin [name] hydrocephalus. ORBITS: Postoperative changes to both globes since 2008. SINUSES AND MASTOIDS: The sinuses and mastoid air cells are clear. 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 Justin [name] acute fracture or focal osseous lesion. IMPRESSION: 1. Confirmed small subacute left PCA territory infarcts in the occipital lobe with Justin [name] hemorrhagic transformation or mass effect. 2. Advanced cerebellar atrophy since 2008, nonspecific. 3. Otherwise mild chronic encephalomalacia along the bilateral posterior insular cortex is also new from 2008. Electronically signed by: Helayne Hurst MD 03/08/2024 06:18 AM EDT RP Workstation: [id number]   CT ANGIO HEAD NECK W WO CM (CODE STROKE) Result Date: 03/07/2024 CLINICAL DATA:  Left-sided weakness EXAM: CT ANGIOGRAPHY HEAD AND NECK WITH AND WITHOUT CONTRAST TECHNIQUE: Multidetector CT imaging of the head and neck was performed using the standard protocol  during bolus administration of intravenous contrast. Multiplanar CT image reconstructions and MIPs were obtained to evaluate the vascular anatomy. Carotid stenosis measurements (when applicable) are obtained utilizing NASCET criteria, using the distal internal carotid diameter as the denominator. RADIATION DOSE REDUCTION: This exam was performed according to the departmental dose-optimization program which includes automated exposure control, adjustment of the mA and/or kV according to patient size and/or use of iterative reconstruction technique. CONTRAST:  75mL OMNIPAQUE IOHEXOL 350 MG/ML SOLN COMPARISON:  None Available. CTA NECK: CTA NECK Aortic arch: Justin [name] proximal vessel stenosis. Right carotid: There is a small amount of plaque at the bifurcation without significant stenosis Left carotid: Small amount of plaque at the bifurcation without significant stenosis Right vertebral: Normal Left vertebral: Normal Soft tissues: Justin [name] significant abnormality Other comments: None CTA HEAD: CTA HEAD Right anterior circulation: The internal carotid artery is  patent without significant stenosis. The anterior and middle cerebral arteries are patent without significant stenosis or proximal branch occlusion. Justin [name] aneurysm. Left anterior circulation: The internal carotid artery is patent without significant stenosis. The anterior and middle cerebral arteries are patent without significant stenosis or proximal branch occlusion. Justin [name] aneurysm. Posterior circulation: Both vertebral arteries are patent. 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 Both posterior cerebral arteries are patent. IMPRESSION: 1. 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 Justin [name] proximal vessel occlusion in the anterior circulation Electronically Signed   By: Nancyann Burns M.D.   On: 03/07/2024 11:52   CT HEAD CODE STROKE WO CONTRAST Result Date: 03/07/2024 CLINICAL DATA:  Code stroke. Provided history: Neuro deficit, acute, stroke  suspected. Slurred speech. Left-sided weakness. EXAM: CT HEAD WITHOUT CONTRAST TECHNIQUE: Contiguous axial images were obtained from the base of the skull through the vertex without intravenous contrast. RADIATION DOSE REDUCTION: This exam was performed according to the departmental dose-optimization program which includes automated exposure control, adjustment of the mA and/or kV according to patient size and/or use of iterative reconstruction technique. COMPARISON:  Brain MRI 09/03/2006. FINDINGS: Brain: Generalized cerebral and cerebellar atrophy. Small age-indeterminate cortical infarct within the left occipital lobe (PCA vascular territory) (series 5, image 14). Patchy and ill-defined hypoattenuation within the cerebral white matter, nonspecific but compatible with mild chronic small vessel ischemic disease. Small chronic appearing lacunar infarct within the right lentiform nucleus. There is Justin [name] acute intracranial hemorrhage. Justin [name] extra-axial fluid collection. Justin [name] evidence of an intracranial mass. Justin [name] midline shift. Vascular: Justin [name] hyperdense vessel.  Atherosclerotic calcifications. Skull: Justin [name] calvarial fracture or aggressive osseous lesion. Sinuses/Orbits: Justin [name] mass or acute finding within the imaged orbits. Justin [name] significant paranasal sinus disease at the imaged levels. ASPECTS (Alberta Stroke Program Early CT Score) - Ganglionic level infarction (caudate, lentiform nuclei, internal capsule, insula, M1-M3 cortex): 7 - Supraganglionic infarction (M4-M6 cortex): 3 Total score (0-10 with 10 being normal): 10 (when discounting a chronic right basal ganglia lacunar infarct). These results were called by telephone at the time of interpretation on 03/07/2024 at 10:38 am to provider JOSHUA LONG , who verbally acknowledged these results. IMPRESSION: 1. Small age-indeterminate cortical infarct within the left occipital lobe (PCA vascular territory). A brain MRI may be obtained for further evaluation, as clinically warranted. 2. Small  chronic lacunar infarct within the right basal ganglia. 3. Background parenchymal atrophy and mild cerebral white matter chronic small vessel ischemic disease. Electronically Signed   By: Rockey Childs D.O.   On: 03/07/2024 10:41     TODAY-DAY OF DISCHARGE:  Subjective:   Justin Justin [name] today has Justin [name] headache,Justin [name] chest abdominal pain,Justin [name] new weakness tingling or numbness, feels much better wants to go home today.   Objective:   Blood pressure (!) 116/48, pulse 76, temperature 97.6 F (36.4 C), temperature source Oral, resp. rate 20, height 5' 10 (1.778 m), weight 82.1 kg, SpO2 94%.  Intake/Output Summary (Last 24 hours) at 03/09/2024 0919 Last data filed at 03/09/2024 0853 Gross per 24 hour  Intake --  Output 300 ml  Net -300 ml   Filed Weights   03/07/24 1035 03/07/24 2036  Weight: 81.9 kg 82.1 kg    Exam: Awake Alert, Oriented *3, Justin [name] new F.N deficits, Normal affect .AT,PERRAL Supple Neck,Justin [name] JVD, Justin [name] cervical lymphadenopathy appriciated.  Symmetrical Chest wall movement, Good air movement bilaterally, CTAB RRR,Justin [name] Gallops,Rubs or new Murmurs, Justin [name] Parasternal Heave +ve B.Sounds, Abd Soft, Non tender, Justin [name] organomegaly appriciated, Justin [name] rebound -guarding or rigidity.  Justin [name] Cyanosis, Clubbing or edema, Justin [name] new Rash or bruise   PERTINENT RADIOLOGIC STUDIES: ECHOCARDIOGRAM COMPLETE Result Date: 03/08/2024    ECHOCARDIOGRAM REPORT   Patient Name:   Justin Justin [name] Date of Exam: 03/08/2024 Medical Rec #:  [id number]       Height:       70.0 in Accession #:    [id number]      Weight:       181.0 lb Date of Birth:  [birth year]       BSA:          2.001 m Patient Age:    82 years        BP:           150/77 mmHg Patient Gender: M               HR:           60 bpm. Exam Location:  Inpatient Procedure: 2D Echo, Cardiac Doppler, Color Doppler and Saline Contrast Bubble            Study (Both Spectral and Color Flow Doppler were utilized during            procedure). Indications:    TIA  History:         Patient has prior history of Echocardiogram examinations, most                 recent 04/19/2020. Signs/Symptoms:Altered Mental Status; Risk                 Factors:Hypertension and Dyslipidemia. Abdominal aortic                 dissection.  Sonographer:    Ellouise Mose RDCS Referring Phys: [id number] TIMOTHY S OPYD  Sonographer Comments: Technically difficult study due to poor echo windows and Justin [name] subcostal window. IMPRESSIONS  1. Left ventricular ejection fraction, by estimation, is 70 to 75%. The left ventricle has hyperdynamic function. The left ventricle has Justin [name] regional wall motion abnormalities. There is mild left ventricular hypertrophy. Left ventricular diastolic parameters are consistent with Grade I diastolic dysfunction (impaired relaxation).  2. Right ventricular systolic function is normal. The right ventricular size is normal. Tricuspid regurgitation signal is inadequate for assessing PA pressure.  3. The mitral valve is normal in structure. Justin [name] evidence of mitral valve regurgitation. Justin [name] evidence of mitral stenosis.  4. The aortic valve is tricuspid. Aortic valve regurgitation is not visualized. Justin [name] aortic stenosis is present.  5. Agitated saline contrast bubble study was negative, with Justin [name] evidence of any interatrial shunt. Comparison(s): A prior study was performed on 04/19/2020. Justin [name] significant change from prior study. FINDINGS  Left Ventricle: Left ventricular ejection fraction, by estimation, is 70 to 75%. The left ventricle has hyperdynamic function. The left ventricle has Justin [name] regional wall motion abnormalities. The left ventricular internal cavity size was normal in size. There is mild left ventricular hypertrophy. Left ventricular diastolic parameters are consistent with Grade I diastolic dysfunction (impaired relaxation). Right Ventricle: The right ventricular size is normal. Justin [name] increase in right ventricular wall thickness. Right ventricular systolic function is normal. Tricuspid regurgitation signal is  inadequate for assessing PA pressure. Left Atrium: Left atrial size was normal in size. Right Atrium: Right atrial size was normal in size. Pericardium: Trivial pericardial effusion is present. Mitral Valve: The mitral valve is normal in structure. Justin [name] evidence of mitral valve regurgitation. Justin [name] evidence of mitral valve stenosis. Tricuspid Valve: The tricuspid valve is normal in structure. Tricuspid  valve regurgitation is not demonstrated. Justin [name] evidence of tricuspid stenosis. Aortic Valve: The aortic valve is tricuspid. Aortic valve regurgitation is not visualized. Justin [name] aortic stenosis is present. Pulmonic Valve: The pulmonic valve was normal in structure. Pulmonic valve regurgitation is not visualized. Justin [name] evidence of pulmonic stenosis. Aorta: The aortic root and ascending aorta are structurally normal, with Justin [name] evidence of dilitation. Venous: The inferior vena cava was not well visualized. IAS/Shunts: Justin [name] atrial level shunt detected by color flow Doppler. Agitated saline contrast was given intravenously to evaluate for intracardiac shunting. Agitated saline contrast bubble study was negative, with Justin [name] evidence of any interatrial shunt.  LEFT VENTRICLE PLAX 2D LVIDd:         4.35 cm     Diastology LVIDs:         2.15 cm     LV e' medial:    4.79 cm/s LV PW:         0.93 cm     LV E/e' medial:  15.5 LV IVS:        1.00 cm     LV e' lateral:   6.20 cm/s LVOT diam:     2.00 cm     LV E/e' lateral: 12.0 LV SV:         95 LV SV Index:   47 LVOT Area:     3.14 cm LV IVRT:       70 msec  LV Volumes (MOD) LV vol d, MOD A2C: 55.5 ml LV vol d, MOD A4C: 44.0 ml LV vol s, MOD A2C: 15.7 ml LV vol s, MOD A4C: 13.8 ml LV SV MOD A2C:     39.8 ml LV SV MOD A4C:     44.0 ml LV SV MOD BP:      35.0 ml RIGHT VENTRICLE RV S prime:     17.40 cm/s TAPSE (M-mode): 2.9 cm LEFT ATRIUM             Index LA diam:        3.59 cm 1.79 cm/m LA Vol (A2C):   27.6 ml 13.80 ml/m LA Vol (A4C):   24.5 ml 12.25 ml/m LA Biplane Vol: 27.4 ml 13.70 ml/m   AORTIC VALVE LVOT Vmax:   147.00 cm/s LVOT Vmean:  98.500 cm/s LVOT VTI:    0.301 m  AORTA Ao Root diam: 3.01 cm Ao Asc diam:  3.52 cm MITRAL VALVE MV Area (PHT): 3.27 cm    SHUNTS MV Decel Time: 232 msec    Systemic VTI:  0.30 m MV E velocity: 74.40 cm/s  Systemic Diam: 2.00 cm MV A velocity: 83.75 cm/s MV E/A ratio:  0.89 Emeline Calender Electronically signed by Emeline Calender Signature Date/Time: 03/08/2024/5:11:17 PM    Final    MR BRAIN WO CONTRAST Result Date: 03/08/2024 EXAM: MR Brain without Intravenous Contrast. CLINICAL HISTORY: 83 year old male with acute neuro deficit, stroke suspected, and age-indeterminate left PCA cortical infarct by CT. TECHNIQUE: Magnetic resonance images of the brain without intravenous contrast in multiple planes. CONTRAST: Without. COMPARISON: Head CT and CTA head and neck yesterday. Previous brain MRI 09/03/2006. FINDINGS: BRAIN: Confirmed small areas of abnormality diffusion in the lateral left occipital lobe cortex (series 5 image 70, corresponding to CT finding), also subcortical white matter of the left lateral occipital lobe on series 5 image 74. These appear mildly restricted associated T2 and FLAIR hyperintense cytotoxic edema at those areas with Justin [name] hemorrhagic transformation or mass effect. Justin [name] other diffusion restriction. The central arterial and venous  flow voids are patent. Major vascular flow voids appear stable from the 2008 MRI. Generalized cerebral hemisphere volume loss since 2008 appears to be age appropriate. However, cerebellar atrophy is advanced and disproportionate (sagittal image 13). Justin [name] superimposed chronic cerebral blood products. There is chronic encephalomalacia along both posterior insular cortex (series 11 image 29) new from 2008. Elsewhere only mild for age T2 heterogeneity in the basal ganglia. Justin [name] midline shift or extra-axial fluid collection. Justin [name] intracranial mass or hemorrhage. VENTRICLES: Justin [name] hydrocephalus. ORBITS: Postoperative changes to both globes  since 2008. SINUSES AND MASTOIDS: The sinuses and mastoid air cells are clear. 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 Justin [name] acute fracture or focal osseous lesion. IMPRESSION: 1. Confirmed small subacute left PCA territory infarcts in the occipital lobe with Justin [name] hemorrhagic transformation or mass effect. 2. Advanced cerebellar atrophy since 2008, nonspecific. 3. Otherwise mild chronic encephalomalacia along the bilateral posterior insular cortex is also new from 2008. Electronically signed by: Helayne Hurst MD 03/08/2024 06:18 AM EDT RP Workstation: [id number]   CT ANGIO HEAD NECK W WO CM (CODE STROKE) Result Date: 03/07/2024 CLINICAL DATA:  Left-sided weakness EXAM: CT ANGIOGRAPHY HEAD AND NECK WITH AND WITHOUT CONTRAST TECHNIQUE: Multidetector CT imaging of the head and neck was performed using the standard protocol during bolus administration of intravenous contrast. Multiplanar CT image reconstructions and MIPs were obtained to evaluate the vascular anatomy. Carotid stenosis measurements (when applicable) are obtained utilizing NASCET criteria, using the distal internal carotid diameter as the denominator. RADIATION DOSE REDUCTION: This exam was performed according to the departmental dose-optimization program which includes automated exposure control, adjustment of the mA and/or kV according to patient size and/or use of iterative reconstruction technique. CONTRAST:  75mL OMNIPAQUE IOHEXOL 350 MG/ML SOLN COMPARISON:  None Available. CTA NECK: CTA NECK Aortic arch: Justin [name] proximal vessel stenosis. Right carotid: There is a small amount of plaque at the bifurcation without significant stenosis Left carotid: Small amount of plaque at the bifurcation without significant stenosis Right vertebral: Normal Left vertebral: Normal Soft tissues: Justin [name] significant abnormality Other comments: None CTA HEAD: CTA HEAD Right anterior circulation: The internal carotid artery is  patent without significant stenosis. The anterior and middle cerebral arteries are patent without significant stenosis or proximal branch occlusion. Justin [name] aneurysm. Left anterior circulation: The internal carotid artery is patent without significant stenosis. The anterior and middle cerebral arteries are patent without significant stenosis or proximal branch occlusion. Justin [name] aneurysm. Posterior circulation: Both vertebral arteries are patent. 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 Both posterior cerebral arteries are patent. IMPRESSION: 1. 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 Justin [name] proximal vessel occlusion in the anterior circulation Electronically Signed   By: Nancyann Burns M.D.   On: 03/07/2024 11:52   CT HEAD CODE STROKE WO CONTRAST Result Date: 03/07/2024 CLINICAL DATA:  Code stroke. Provided history: Neuro deficit, acute, stroke suspected. Slurred speech. Left-sided weakness. EXAM: CT HEAD WITHOUT CONTRAST TECHNIQUE: Contiguous axial images were obtained from the base of the skull through the vertex without intravenous contrast. RADIATION DOSE REDUCTION: This exam was performed according to the departmental dose-optimization program which includes automated exposure control, adjustment of the mA and/or kV according to patient size and/or use of iterative reconstruction technique. COMPARISON:  Brain MRI 09/03/2006. FINDINGS: Brain: Generalized cerebral and cerebellar atrophy. Small age-indeterminate cortical infarct within the left occipital lobe (PCA vascular territory) (series 5, image 14). Patchy and ill-defined hypoattenuation within the cerebral white matter, nonspecific but compatible  with mild chronic small vessel ischemic disease. Small chronic appearing lacunar infarct within the right lentiform nucleus. There is Justin [name] acute intracranial hemorrhage. Justin [name] extra-axial fluid collection. Justin [name] evidence of an intracranial mass. Justin [name] midline shift. Vascular: Justin [name] hyperdense vessel.   Atherosclerotic calcifications. Skull: Justin [name] calvarial fracture or aggressive osseous lesion. Sinuses/Orbits: Justin [name] mass or acute finding within the imaged orbits. Justin [name] significant paranasal sinus disease at the imaged levels. ASPECTS ([place] Stroke Program Early CT Score) - Ganglionic level infarction (caudate, lentiform nuclei, internal capsule, insula, M1-M3 cortex): 7 - Supraganglionic infarction (M4-M6 cortex): 3 Total score (0-10 with 10 being normal): 10 (when discounting a chronic right basal ganglia lacunar infarct). These results were called by telephone at the time of interpretation on 03/07/2024 at 10:38 am to provider JOSHUA LONG , who verbally acknowledged these results. IMPRESSION: 1. Small age-indeterminate cortical infarct within the left occipital lobe (PCA vascular territory). A brain MRI may be obtained for further evaluation, as clinically warranted. 2. 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 3. Background parenchymal atrophy and mild cerebral white matter chronic small vessel ischemic disease. Electronically Signed   By: Rockey Childs D.O.   On: 03/07/2024 10:41     PERTINENT LAB RESULTS: CBC: Recent Labs    03/07/24 1035 03/08/24 0230  WBC 11.6* 15.3*  HGB 15.1 14.5  HCT 44.9 43.2  PLT 244 225   CMET CMP     Component Value Date/Time   NA 136 03/08/2024 0230   NA 139 04/19/2020 1037   K 3.6 03/08/2024 0230   CL 101 03/08/2024 0230   CO2 23 03/08/2024 0230   GLUCOSE 154 (H) 03/08/2024 0230   BUN 14 03/08/2024 0230   BUN 19 04/19/2020 1037   CREATININE 1.07 03/08/2024 0230   CREATININE 0.94 06/09/2023 0909   CALCIUM  9.1 03/08/2024 0230   PROT 6.2 (L) 03/08/2024 0230   PROT 6.1 08/19/2015 0826   ALBUMIN 3.4 (L) 03/08/2024 0230   ALBUMIN 4.0 08/19/2015 0826   AST 23 03/08/2024 0230   ALT 35 03/08/2024 0230   ALKPHOS 108 03/08/2024 0230   BILITOT 1.1 03/08/2024 0230   BILITOT 0.4 08/19/2015 0826   EGFR 81 06/09/2023 0909   GFRNONAA >60 03/08/2024  0230   GFRNONAA 73 10/04/2019 0949    GFR Estimated Creatinine Clearance: 55 mL/min (by C-G formula based on SCr of 1.07 mg/dL). Justin [name] results for input(s): LIPASE, AMYLASE in the last 72 hours. Justin [name] results for input(s): CKTOTAL, CKMB, CKMBINDEX, TROPONINI in the last 72 hours. Invalid input(s): POCBNP Justin [name] results for input(s): DDIMER in the last 72 hours. Recent Labs    03/08/24 0230  HGBA1C 6.7*   Recent Labs    03/08/24 0230  CHOL 121  HDL 36*  LDLCALC 44  TRIG 792*  CHOLHDL 3.4   Justin [name] results for input(s): TSH, T4TOTAL, T3FREE, THYROIDAB in the last 72 hours.  Invalid input(s): FREET3 Justin [name] results for input(s): VITAMINB12, FOLATE, FERRITIN, TIBC, IRON, RETICCTPCT in the last 72 hours. Coags: Recent Labs    03/07/24 1035  INR 0.9   Microbiology: Justin [name] results found for this or any previous visit (from the past 240 hours).  FURTHER DISCHARGE INSTRUCTIONS:  Get Medicines reviewed and adjusted: Please take all your medications with you for your next visit with your Primary MD  Laboratory/radiological data: Please request your Primary MD to go over all hospital tests and procedure/radiological results at the follow up, please ask your Primary MD to get all Hospital records sent to his/her  office.  In some cases, they will be blood work, cultures and biopsy results pending at the time of your discharge. Please request that your primary care M.D. goes through all the records of your hospital data and follows up on these results.  Also Note the following: If you experience worsening of your admission symptoms, develop shortness of breath, life threatening emergency, suicidal or homicidal thoughts you must seek medical attention immediately by calling 911 or calling your MD immediately  if symptoms less severe.  You must read complete instructions/literature along with all the possible adverse reactions/side effects for all the Medicines you take  and that have been prescribed to you. Take any new Medicines after you have completely understood and accpet all the possible adverse reactions/side effects.   Do not drive when taking Pain medications or sleeping medications (Benzodaizepines)  Do not take more than prescribed Pain, Sleep and Anxiety Medications. It is not advisable to combine anxiety,sleep and pain medications without talking with your primary care practitioner  Special Instructions: If you have smoked or chewed Tobacco  in the last 2 yrs please stop smoking, stop any regular Alcohol  and or any Recreational drug use.  Wear Seat belts while driving.  Please note: You were cared for by a hospitalist during your hospital stay. Once you are discharged, your primary care physician will handle any further medical issues. Please note that Justin [name] REFILLS for any discharge medications will be authorized once you are discharged, as it is imperative that you return to your primary care physician (or establish a relationship with a primary care physician if you do not have one) for your post hospital discharge needs so that they can reassess your need for medications and monitor your lab values.  Total Time spent coordinating discharge including counseling, education and face to face time equals greater than 30 minutes.  SignedBETHA Donalda Applebaum 03/09/2024 9:19 AM

## 2024-03-10 ENCOUNTER — Emergency Department (HOSPITAL_COMMUNITY)

## 2024-03-10 ENCOUNTER — Encounter (HOSPITAL_COMMUNITY): Payer: Self-pay | Admitting: Internal Medicine

## 2024-03-10 ENCOUNTER — Observation Stay (HOSPITAL_COMMUNITY)
Admission: EM | Admit: 2024-03-10 | Discharge: 2024-03-13 | Disposition: A | Discharge status: Home / Self Care | Attending: Cardiology | Admitting: Cardiology

## 2024-03-10 ENCOUNTER — Other Ambulatory Visit: Payer: Self-pay

## 2024-03-10 DIAGNOSIS — I10 Essential (primary) hypertension: Secondary | ICD-10-CM | POA: Diagnosis not present

## 2024-03-10 DIAGNOSIS — Z7982 Long term (current) use of aspirin: Secondary | ICD-10-CM | POA: Insufficient documentation

## 2024-03-10 DIAGNOSIS — R2689 Other abnormalities of gait and mobility: Secondary | ICD-10-CM | POA: Diagnosis not present

## 2024-03-10 DIAGNOSIS — R29701 NIHSS score 1: Secondary | ICD-10-CM

## 2024-03-10 DIAGNOSIS — E119 Type 2 diabetes mellitus without complications: Secondary | ICD-10-CM | POA: Diagnosis not present

## 2024-03-10 DIAGNOSIS — R4789 Other speech disturbances: Secondary | ICD-10-CM | POA: Diagnosis not present

## 2024-03-10 DIAGNOSIS — G112 Late-onset cerebellar ataxia: Secondary | ICD-10-CM | POA: Insufficient documentation

## 2024-03-10 DIAGNOSIS — R29898 Other symptoms and signs involving the musculoskeletal system: Secondary | ICD-10-CM | POA: Diagnosis not present

## 2024-03-10 DIAGNOSIS — I639 Cerebral infarction, unspecified: Principal | ICD-10-CM | POA: Diagnosis present

## 2024-03-10 DIAGNOSIS — I739 Peripheral vascular disease, unspecified: Secondary | ICD-10-CM | POA: Diagnosis present

## 2024-03-10 DIAGNOSIS — I6523 Occlusion and stenosis of bilateral carotid arteries: Secondary | ICD-10-CM | POA: Diagnosis not present

## 2024-03-10 DIAGNOSIS — K219 Gastro-esophageal reflux disease without esophagitis: Secondary | ICD-10-CM | POA: Diagnosis present

## 2024-03-10 DIAGNOSIS — R479 Unspecified speech disturbances: Principal | ICD-10-CM

## 2024-03-10 DIAGNOSIS — Z87891 Personal history of nicotine dependence: Secondary | ICD-10-CM | POA: Insufficient documentation

## 2024-03-10 DIAGNOSIS — Z79899 Other long term (current) drug therapy: Secondary | ICD-10-CM | POA: Insufficient documentation

## 2024-03-10 DIAGNOSIS — Z7902 Long term (current) use of antithrombotics/antiplatelets: Secondary | ICD-10-CM | POA: Diagnosis not present

## 2024-03-10 DIAGNOSIS — I63512 Cerebral infarction due to unspecified occlusion or stenosis of left middle cerebral artery: Secondary | ICD-10-CM | POA: Diagnosis not present

## 2024-03-10 DIAGNOSIS — E7800 Pure hypercholesterolemia, unspecified: Secondary | ICD-10-CM | POA: Diagnosis not present

## 2024-03-10 DIAGNOSIS — I6329 Cerebral infarction due to unspecified occlusion or stenosis of other precerebral arteries: Secondary | ICD-10-CM | POA: Diagnosis not present

## 2024-03-10 DIAGNOSIS — QA00102 CACNA1A-related neurodevelopmental disorder: Secondary | ICD-10-CM | POA: Diagnosis present

## 2024-03-10 DIAGNOSIS — R471 Dysarthria and anarthria: Secondary | ICD-10-CM | POA: Diagnosis not present

## 2024-03-10 LAB — I-STAT CHEM 8, ED
BUN: 19 mg/dL (ref 8–23)
Calcium, Ion: 1.3 mmol/L (ref 1.15–1.40)
Chloride: 105 mmol/L (ref 98–111)
Creatinine, Ser: 1.2 mg/dL (ref 0.61–1.24)
Glucose, Bld: 148 mg/dL — ABNORMAL HIGH (ref 70–99)
HCT: 43 % (ref 39.0–52.0)
Hemoglobin: 14.6 g/dL (ref 13.0–17.0)
Potassium: 4.2 mmol/L (ref 3.5–5.1)
Sodium: 141 mmol/L (ref 135–145)
TCO2: 23 mmol/L (ref 22–32)

## 2024-03-10 LAB — COMPREHENSIVE METABOLIC PANEL WITH GFR
ALT: 55 U/L — ABNORMAL HIGH (ref 0–44)
AST: 43 U/L — ABNORMAL HIGH (ref 15–41)
Albumin: 3.8 g/dL (ref 3.5–5.0)
Alkaline Phosphatase: 109 U/L (ref 38–126)
Anion gap: 12 (ref 5–15)
BUN: 17 mg/dL (ref 8–23)
CO2: 21 mmol/L — ABNORMAL LOW (ref 22–32)
Calcium: 9.7 mg/dL (ref 8.9–10.3)
Chloride: 106 mmol/L (ref 98–111)
Creatinine, Ser: 1.17 mg/dL (ref 0.61–1.24)
GFR, Estimated: >60 mL/min (ref >60)
Glucose, Bld: 149 mg/dL — ABNORMAL HIGH (ref 70–99)
Potassium: 4.2 mmol/L (ref 3.5–5.1)
Sodium: 139 mmol/L (ref 135–145)
Total Bilirubin: 0.9 mg/dL (ref 0.0–1.2)
Total Protein: 6.7 g/dL (ref 6.5–8.1)

## 2024-03-10 LAB — DIFFERENTIAL
Abs Immature Granulocytes: 0.06 K/uL (ref 0.00–0.07)
Basophils Absolute: 0.1 K/uL (ref 0.0–0.1)
Basophils Relative: 1 %
Eosinophils Absolute: 0.2 K/uL (ref 0.0–0.5)
Eosinophils Relative: 1 %
Immature Granulocytes: 0 %
Lymphocytes Relative: 38 %
Lymphs Abs: 5.9 K/uL — ABNORMAL HIGH (ref 0.7–4.0)
Monocytes Absolute: 1.4 K/uL — ABNORMAL HIGH (ref 0.1–1.0)
Monocytes Relative: 9 %
Neutro Abs: 7.8 K/uL — ABNORMAL HIGH (ref 1.7–7.7)
Neutrophils Relative %: 51 %

## 2024-03-10 LAB — ETHANOL: Alcohol, Ethyl (B): <15 mg/dL (ref <15)

## 2024-03-10 LAB — CBC
HCT: 44.4 % (ref 39.0–52.0)
Hemoglobin: 14.9 g/dL (ref 13.0–17.0)
MCH: 30.3 pg (ref 26.0–34.0)
MCHC: 33.6 g/dL (ref 30.0–36.0)
MCV: 90.4 fL (ref 80.0–100.0)
Platelets: 248 K/uL (ref 150–400)
RBC: 4.91 MIL/uL (ref 4.22–5.81)
RDW: 13.2 % (ref 11.5–15.5)
WBC: 15.4 K/uL — ABNORMAL HIGH (ref 4.0–10.5)
nRBC: 0 % (ref 0.0–0.2)

## 2024-03-10 LAB — GLUCOSE, CAPILLARY: Glucose-Capillary: 114 mg/dL — ABNORMAL HIGH (ref 70–99)

## 2024-03-10 LAB — PROTIME-INR
INR: 0.9 (ref 0.8–1.2)
Prothrombin Time: 12.1 s (ref 11.4–15.2)

## 2024-03-10 LAB — CBG MONITORING, ED: Glucose-Capillary: 152 mg/dL — ABNORMAL HIGH (ref 70–99)

## 2024-03-10 LAB — APTT: aPTT: 25 s (ref 24–36)

## 2024-03-10 MED ORDER — ENOXAPARIN SODIUM 40 MG/0.4ML IJ SOSY
40.0000 mg | PREFILLED_SYRINGE | INTRAMUSCULAR | Status: DC
  Administered 2024-03-10 – 2024-03-12 (×3): 40 mg via SUBCUTANEOUS
  Filled 2024-03-10 (×3): qty 0.4

## 2024-03-10 MED ORDER — ASPIRIN 81 MG PO TBEC
81.0000 mg | DELAYED_RELEASE_TABLET | Freq: Every evening | ORAL | Status: DC
  Administered 2024-03-11 – 2024-03-12 (×2): 81 mg via ORAL
  Filled 2024-03-10 (×3): qty 1

## 2024-03-10 MED ORDER — ACETAMINOPHEN 650 MG RE SUPP: 650.0000 mg | RECTAL | Status: DC | PRN

## 2024-03-10 MED ORDER — STROKE: EARLY STAGES OF RECOVERY BOOK
Freq: Once | Status: AC
  Filled 2024-03-10: qty 1

## 2024-03-10 MED ORDER — PREGABALIN 25 MG PO CAPS
25.0000 mg | ORAL_CAPSULE | Freq: Two times a day (BID) | ORAL | Status: DC
  Administered 2024-03-11 – 2024-03-13 (×5): 25 mg via ORAL
  Filled 2024-03-10 (×5): qty 1

## 2024-03-10 MED ORDER — SODIUM CHLORIDE 0.9% FLUSH: 3.0000 mL | Freq: Once | INTRAVENOUS | Status: DC

## 2024-03-10 MED ORDER — MIRABEGRON ER 25 MG PO TB24
25.0000 mg | ORAL_TABLET | Freq: Every morning | ORAL | Status: DC
  Administered 2024-03-11 – 2024-03-13 (×3): 25 mg via ORAL
  Filled 2024-03-10 (×3): qty 1

## 2024-03-10 MED ORDER — INSULIN ASPART 100 UNIT/ML IJ SOLN
0.0000 [IU] | Freq: Three times a day (TID) | INTRAMUSCULAR | Status: DC
  Administered 2024-03-11: 2 [IU] via SUBCUTANEOUS
  Administered 2024-03-11: 5 [IU] via SUBCUTANEOUS
  Administered 2024-03-11 – 2024-03-12 (×3): 2 [IU] via SUBCUTANEOUS
  Administered 2024-03-12: 3 [IU] via SUBCUTANEOUS
  Administered 2024-03-13 (×2): 2 [IU] via SUBCUTANEOUS

## 2024-03-10 MED ORDER — PANTOPRAZOLE SODIUM 40 MG PO TBEC
40.0000 mg | DELAYED_RELEASE_TABLET | Freq: Every day | ORAL | Status: DC
  Administered 2024-03-11 – 2024-03-12 (×2): 40 mg via ORAL
  Filled 2024-03-10 (×2): qty 1

## 2024-03-10 MED ORDER — ACETAMINOPHEN 325 MG PO TABS: 650.0000 mg | ORAL_TABLET | ORAL | Status: DC | PRN

## 2024-03-10 MED ORDER — SODIUM CHLORIDE 0.9 % IV SOLN: INTRAVENOUS | Status: AC

## 2024-03-10 MED ORDER — SENNOSIDES-DOCUSATE SODIUM 8.6-50 MG PO TABS: 1.0000 | ORAL_TABLET | Freq: Every evening | ORAL | Status: DC | PRN

## 2024-03-10 MED ORDER — ACETAMINOPHEN 160 MG/5ML PO SOLN: 650.0000 mg | ORAL | Status: DC | PRN

## 2024-03-10 MED ORDER — CLOPIDOGREL BISULFATE 75 MG PO TABS
75.0000 mg | ORAL_TABLET | Freq: Every morning | ORAL | Status: DC
  Administered 2024-03-11 – 2024-03-13 (×3): 75 mg via ORAL
  Filled 2024-03-10 (×3): qty 1

## 2024-03-10 NOTE — Code Documentation (Signed)
 Stroke Response Nurse Documentation Code Documentation  Justin [name] is a 83 y.o. male arriving to [facility]  via Private Vehicle on 10/10 with past medical hx of CVA, HTN, neurodegenerative gait disorder, PAD, DM2. On clopidogrel 75 mg daily. Code stroke was activated by ED.   Patient from home where he was LKW when spoken to on the phone at 1100 today and caregiver found him to be aphasic with RUE weakness at noon. Discharged yesterday from this facility with acute CVA and residual dysarthria.   Stroke team at the bedside on patient arrival. Labs drawn and patient cleared for CT by Dr. Ginger. Patient to CT with team. NIHSS 1, see documentation for details and code stroke times. Patient with dysarthria  on exam. The following imaging was completed:  CT Head. Patient is not a candidate for IV Thrombolytic due to recent stroke. Patient is not a candidate for IR due to LVO not suspected.   Care Plan: q2 NIHSS and vitals. MRI.   Bedside handoff with ED RN Margarie.    Lauraine LITTIE Searle  Stroke Response RN

## 2024-03-10 NOTE — ED Triage Notes (Signed)
 Patient caregiver reports that he woke up okay with symptoms starting around noon with aphasia and right arm weakness. Caregiver states aphasia is worse than usual. Patient also fell from chair but did not hit head, is on plavix.

## 2024-03-10 NOTE — H&P (Addendum)
 History and Physical   MERWIN BREDEN FMW:[id number] DOB: 03/13/1941 DOA: 03/10/2024  PCP: Caro Harlene POUR, NP   Patient coming from: Home  Chief Complaint: Abnormal speech,R arm weakness.  HPI: Justin [name] is a 83 y.o. male with medical history significant of hypertension, hyperlipidemia, diabetes, PAD, GERD, spinocerebellar ataxia type VI, abdominal aortic dissection, recent admission for CVA presenting with focal neurologic deficits.  Patient was recently admitted for CVA.  Workup concerning for possible embolic etiology with plan for longer-term monitoring.  Discharged on aspirin  and Plavix.  Patient has spinocerebellar ataxia type VI and has help at home.  Today his caregiver noted between 11 AM and 12 PM patient had onset of abnormal speech and R arm weakness.  He initially noticed some numbness in his left arm where his arm felt like he was asleep.  This improved and then he noted that he had some numbness in his right arm with weakness.  Patient reported associated headache.  Denies fevers, chills, chest pain, shortness of breath, abdominal pain, constipation, diarrhea, nausea, vomiting.  ED Course: Vital signs in the ED notable for blood pressure in the 130s-150s systolic.  Lab workup included CMP with bicarb 21, glucose 149, AST 43, ALT 455.  CBC with leukocytosis to 15.4.  PT, PTT, INR within normal limits.  Ethanol level pending.  CT head showed no acute abnormality.  MRI brain showed new acute infarct.  Neurology consulted and are following.  Review of Systems: As per HPI otherwise all other systems reviewed and are negative.  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  03/06/12   Diaphragmatic hernia without mention of obstruction or gangrene 1982   Hiatal hernia   Diplopia 2006   Disturbance of skin sensation 2003   Diverticulosis of colon (without mention of hemorrhage) 1997   Dizziness and giddiness 2001   Dysphagia, unspecified(787.20) 1999 and   Esophagitis, unspecified 1997   GERD (gastroesophageal reflux disease) 2003   Hypertension 2003   Impotence of organic origin 1999   Lack of coordination September 07, 2007   Lumbago 2003   Migraine with aura, without mention of intractable migraine without mention of status migrainosus 1997   Myalgia and myositis, unspecified 1999   Other and unspecified hyperlipidemia 2003   Other disorders of vitreous 2003   Other malaise and fatigue 2003   Other seborrheic keratosis 2013   Other seborrheic keratosis 04/13/09   Pain in joint, site unspecified 04/13/12   Personal history of fall 04/15/11   Restless legs syndrome (RLS) 1997   Spinocerebellar disease, 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, 2008   Unspecified hearing loss 2003   Unspecified tinnitus 04/15/11    Past Surgical History:  Procedure Laterality Date   APPENDECTOMY  Age 34   boil removed     Dr. Ebbie   C3-4 anterior cervical surgery  1999   Dr. Carles   CARDIAC CATHETERIZATION  2001   Dr. Bulah   COLONOSCOPY  07/20/07   Dr. Jakie   EYE SURGERY Bilateral 2015   cataracts   LAMINOTOMY / EXCISION DISK POSTERIOR CERVICAL SPINE     Spinal Disk (?4-5)    Social History  reports  that he has never smoked. He quit smokeless tobacco use about 44 years ago.  His smokeless tobacco use included chew. He reports that he does not currently use alcohol. He reports that he does not use drugs.  Allergies  Allergen Reactions   Effexor [Venlafaxine Hcl]     unknown   Serzone [Nefazodone]     unknown   Vivactil [Protriptyline Hcl]     unknown    Family History  Problem Relation Age of Onset   Heart disease Father        SCA with multiple family  members with SCA   Diabetes Neg Hx    Colon cancer Neg Hx    Colon polyps Neg Hx    Esophageal cancer Neg Hx    Kidney disease Neg Hx    Gallbladder disease Neg Hx   \Reviewed on admission  Prior to Admission medications   Medication Sig Start Date End Date Taking? Authorizing Provider  acetaminophen  (TYLENOL  8 HOUR ARTHRITIS PAIN) 650 MG CR tablet Take 1 tablet (650 mg total) by mouth 2 (two) times daily. 04/17/19  Yes Reed, Tiffany L, DO  amLODipine  (NORVASC ) 5 MG tablet TAKE 1 TABLET BY MOUTH EVERY DAY Patient taking differently: Take 5 mg by mouth in the morning. 08/05/23  Yes Caro Harlene POUR, NP  aspirin  EC 81 MG tablet Take 1 tablet (81 mg total) by mouth daily for 21 days. Swallow whole. Patient taking differently: Take 81 mg by mouth every evening. Swallow whole. 03/10/24 03/31/24 Yes Ghimire, Donalda HERO, MD  atorvastatin  (LIPITOR) 10 MG tablet TAKE 1 TABLET BY MOUTH EVERY DAY Patient taking differently: Take 10 mg by mouth in the morning. 12/20/23  Yes Eubanks, [name] K, NP  Bioflavonoid Products (BIOFLEX PO) Take 1 tablet by mouth daily with lunch.   Yes [provider]  Chlorpheniramine Maleate (ALLERGY RELIEF PO) Take 1 tablet by mouth daily with lunch.   Yes [provider]  Cholecalciferol (VITAMIN D3 PO) Take 1 tablet by mouth daily with lunch.   Yes [provider]  clopidogrel (PLAVIX) 75 MG tablet Take 1 tablet (75 mg total) by mouth daily. Patient taking differently: Take 75 mg by mouth in the morning. 03/09/24 03/09/25 Yes Ghimire, Donalda HERO, MD  hydrochlorothiazide  (HYDRODIURIL ) 12.5 MG tablet TAKE 1 TABLET BY MOUTH EVERY DAY Patient taking differently: Take 12.5 mg by mouth at bedtime. 12/20/23  Yes Eubanks, [name] K, NP  ibuprofen (ADVIL) 200 MG tablet Take 200 mg by mouth every 6 (six) hours as needed for mild pain (pain score 1-3) or moderate pain (pain score 4-6).   Yes [provider]  losartan  (COZAAR ) 100 MG tablet Take 1 tablet  (100 mg total) by mouth daily. Patient taking differently: Take 100 mg by mouth in the morning. 05/06/23  Yes Eubanks, [name] K, NP  Multiple Vitamin (MULTIVITAMIN) tablet Take 1 tablet by mouth daily with lunch.   Yes [provider]  MYRBETRIQ  25 MG TB24 tablet TAKE 1 TABLET (25 MG TOTAL) BY MOUTH DAILY. Patient taking differently: Take 25 mg by mouth in the morning. 12/22/23  Yes Eubanks, [name] K, NP  pantoprazole  (PROTONIX ) 40 MG tablet TAKE 1 TABLET BY MOUTH EVERY DAY Patient taking differently: Take 40 mg by mouth at bedtime. 11/01/23  Yes Caro Harlene POUR, NP  potassium chloride  (KLOR-CON ) 10 MEQ tablet TAKE 1 TABLET BY MOUTH EVERY DAY Patient taking differently: Take 10 mEq by mouth at bedtime. 08/10/23  Yes Caro Harlene POUR, NP  pregabalin  (  LYRICA ) 25 MG capsule TAKE 1 CAPSULE BY MOUTH THREE TIMES A DAY Patient taking differently: Take 25 mg by mouth 2 (two) times daily. 09/08/23  Yes Eubanks, [name] K, NP  Probiotic Product (PROBIOTIC DAILY PO) Take 1 tablet by mouth daily with lunch.   Yes [provider]  propranolol  (INDERAL ) 40 MG tablet TAKE 1 TABLET BY MOUTH TWICE A DAY 12/20/23  Yes Caro Harlene POUR, NP    Physical Exam: Vitals:   03/10/24 1346 03/10/24 1416 03/10/24 1449 03/10/24 1500  BP: (!) 151/84   138/66  Pulse: 68   63  Resp: 20   20  Temp: 98.4 F (36.9 C)     TempSrc: Oral     SpO2: 97% 98% 99% 98%  Weight: 80.2 kg       Physical Exam Constitutional:      General: He is not in acute distress.    Appearance: Normal appearance.  HENT:     Head: Normocephalic and atraumatic.     Mouth/Throat:     Mouth: Mucous membranes are moist.     Pharynx: Oropharynx is clear.  Eyes:     Extraocular Movements: Extraocular movements intact.     Pupils: Pupils are equal, round, and reactive to light.  Cardiovascular:     Rate and Rhythm: Normal rate and regular rhythm.     Pulses: Normal pulses.     Heart sounds: Normal heart sounds.  Pulmonary:      Effort: Pulmonary effort is normal. No respiratory distress.     Breath sounds: Normal breath sounds.  Abdominal:     General: Bowel sounds are normal. There is no distension.     Palpations: Abdomen is soft.     Tenderness: There is no abdominal tenderness.  Musculoskeletal:        General: No swelling or deformity.  Skin:    General: Skin is warm and dry.  Neurological:     Comments: Mental Status: Patient is awake, alert, oriented x3 No signs of aphasia or neglect Cranial Nerves: II: Pupils equal, round, and reactive to light.   III,IV, VI: EOMI without ptosis or diploplia.  V: Facial sensation is symmetric to light touch. VII: Facial movement is symmetric.  VIII: hearing is intact to voice X: Uvula elevates symmetrically XI: Shoulder shrug is symmetric. XII: tongue is midline without atrophy or fasciculations.  Motor: Good effort thorughout, at Least 5/5 bilateral UE, 5/5 bilateral lower extremitiy  Sensory: Sensation is grossly intact    Labs on Admission: I have personally reviewed following labs and imaging studies  CBC: Recent Labs  Lab 03/07/24 1035 03/08/24 0230 03/10/24 1349 03/10/24 1353  WBC 11.6* 15.3* 15.4*  --   NEUTROABS 6.1  --  7.8*  --   HGB 15.1 14.5 14.9 14.6  HCT 44.9 43.2 44.4 43.0  MCV 90.5 89.3 90.4  --   PLT 244 225 248  --     Basic Metabolic Panel: Recent Labs  Lab 03/07/24 1035 03/08/24 0230 03/10/24 1349 03/10/24 1353  NA 138 136 139 141  K 4.4 3.6 4.2 4.2  CL 102 101 106 105  CO2 24 23 21*  --   GLUCOSE 221* 154* 149* 148*  BUN 18 14 17 19   CREATININE 1.16 1.07 1.17 1.20  CALCIUM  10.0 9.1 9.7  --     GFR: Estimated Creatinine Clearance: 49 mL/min (by C-G formula based on SCr of 1.2 mg/dL).  Liver Function Tests: Recent Labs  Lab 03/07/24 1035 03/08/24  0230 03/10/24 1349  AST 27 23 43*  ALT 44 35 55*  ALKPHOS 137* 108 109  BILITOT 0.5 1.1 0.9  PROT 7.1 6.2* 6.7  ALBUMIN 4.4 3.4* 3.8    Urine analysis:     Component Value Date/Time   COLORURINE YELLOW 11/16/2019 1520   APPEARANCEUR CLEAR 11/16/2019 1520   LABSPEC 1.022 11/16/2019 1520   PHURINE < OR = 5.0 11/16/2019 1520   GLUCOSEU NEGATIVE 11/16/2019 1520   HGBUR NEGATIVE 11/16/2019 1520   KETONESUR NEGATIVE 11/16/2019 1520   PROTEINUR NEGATIVE 11/16/2019 1520   NITRITE NEGATIVE 11/16/2019 1520   LEUKOCYTESUR NEGATIVE 11/16/2019 1520    Radiological Exams on Admission: MR BRAIN WO CONTRAST Result Date: 03/10/2024 EXAM: MR Brain without Intravenous Contrast. CLINICAL HISTORY: Stroke, follow up. TECHNIQUE: Magnetic resonance images of the brain without intravenous contrast in multiple planes. CONTRAST: Without. COMPARISON: Head CT 03/10/2024, MRI brain 03/08/2024. FINDINGS: BRAIN: New acute infarct in the left pons. Unchanged late acute infarcts in the left occipital lobe. No acute hemorrhage or significant mass effect. Unchanged atrophy of the superior cerebellum and vermis. No midline shift or extra-axial fluid collection. No cerebellar tonsillar ectopia. The central arterial and venous flow voids are patent. VENTRICLES: No hydrocephalus. ORBITS: The orbits are normal. SINUSES AND MASTOIDS: The sinuses and mastoid air cells are clear. BONES: No acute fracture or focal osseous lesion. IMPRESSION: 1. 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 2. Unchanged late acute infarcts in the left occipital lobe. 3. No acute hemorrhage or significant mass effect. Electronically signed by: Ryan Chess MD 03/10/2024 04:19 PM EDT RP Workstation: [id number]   CT HEAD CODE STROKE WO CONTRAST Result Date: 03/10/2024 EXAM: CT HEAD WITHOUT CONTRAST 03/10/2024 01:56:47 PM TECHNIQUE: CT of the head was performed without the administration of intravenous contrast. Automated exposure control, iterative reconstruction, and/or weight based adjustment of the mA/kV was utilized to reduce the radiation dose to as low as reasonably achievable. COMPARISON: MR head without contrast  03/08/2024. CLINICAL HISTORY: Last known normal at 11 am today. Recent discharge for acute stroke. Progressive right upper extremity weakness and dysarthria. FINDINGS: BRAIN AND VENTRICLES: No acute hemorrhage. Areas of subacute infarction are again noted in the left parietal and occipital lobe. No new or progressive infarcts are present. Mild atrophy and white matter changes are stable. Atherosclerotic calcifications are present in the cavernous carotid arteries bilaterally. No hyperdense vessel is present. No hydrocephalus. No extra-axial collection. No mass effect or midline shift. ORBITS: Bilateral lens replacements are noted. The globes and orbits are otherwise within normal limits. SINUSES: No acute abnormality. SOFT TISSUES AND SKULL: No acute soft tissue abnormality. No skull fracture. Stat 210 pm [place] Stroke Program Early CT (ASPECT) Score ----- Ganglionic (caudate, IC, lentiform nucleus, insula, M1-M3):7 Supraganglionic (M4-M6): 3 Total: 10 IMPRESSION: 1. No acute intracranial abnormality.  ASPECTS 10/10 2. Stable subacute infarcts in the left parietal and occipital lobes without new or progressive infarcts. The pertinent results were texted to Dr. Matthews via the [facility] system at 2:09pm. Electronically signed by: Lonni Necessary MD 03/10/2024 02:11 PM EDT RP Workstation: [id number]   EKG:  not yet performed   Assessment/Plan Principal Problem:   Acute CVA (cerebrovascular accident) [facility]) Active Problems:   Essential hypertension   GERD (gastroesophageal reflux disease)   Neurodegenerative gait disorder   Pure hypercholesterolemia   PAD (peripheral artery disease)   Non-insulin dependent type 2 diabetes mellitus (HCC)   Acute CVA > Patient presenting with R arm weakness and abnormal speech.  Recently discharged  after workup for CVA 2 days ago.  Thought to be embolic in origin and plan was for longer-term monitoring but this is not yet started.  Patient was discharged on aspirin  and  Plavix. > CT head showed no acute normality.  MRI brain confirmed new acute infarct. > Neurology consulted and are following. - Appreciate neurology recommendations and assistance - Allow for permissive HTN  (systolic < 220 and diastolic < 120)  - Continue ASA and Plavix for** - Continue home statin - Echocardiogram, A1c, lipid panel recently done - Recent CTA head & neck  - Tele monitoring  - SLP eval - PT/OT  Hypertension - Permissive hypertension as above  Hyperlipidemia - Continue atorvastatin   Diabetes - SSI  PAD - Continue ASA, Plavix, atorvastatin   Spinocerebellar ataxia type VI > Follows with neurology - Continue to monitor - Supportive care  DVT prophylaxis: Lovenox  for now Code Status:   DNR/DNI Family/Caregiver Communication:  Updated at bedside Disposition Plan:   Patient is from:  Home  Anticipated DC to:  Home  Anticipated DC date:  1 to 2 days  Anticipated DC barriers: None  Consults called:  Neurology Admission status:  Observation, telemetry  Severity of Illness: The appropriate patient status for this patient is OBSERVATION. Observation status is judged to be reasonable and necessary in order to provide the required intensity of service to ensure the patient's safety. The patient's presenting symptoms, physical exam findings, and initial radiographic and laboratory data in the context of their medical condition is felt to place them at decreased risk for further clinical deterioration. Furthermore, it is anticipated that the patient will be medically stable for discharge from the hospital within 2 midnights of admission.    Marsa KATHEE Scurry MD [facility]  How to contact the [facility] Attending or Consulting provider 7A - 7P or covering provider during after hours 7P -7A, for this patient?   Check the care team in [facility] and look for a) attending/consulting [facility] provider listed and b) the [facility] team listed Log into [email or web address] and use [place]'s  universal password to access. If you do not have the password, please contact the hospital operator. Locate the [facility] provider you are looking for under [facility] and page to a number that you can be directly reached. If you still have difficulty reaching the provider, please page the [facility] (Director on Call) for the Hospitalists listed on amion for assistance.  03/10/2024, 5:47 PM

## 2024-03-10 NOTE — ED Provider Triage Note (Signed)
 Emergency Medicine Provider Triage Evaluation Note  Justin [name] , a 83 y.o. male  was evaluated in triage.  Pt complains of slurred speech, right arm weakness. Reports last known normal was 11 am this morning. Reports he was just discharged for acute stroke yesterday. Did have some residual dysarthria from this, however this morning it was worse.  Also right arm weakness is new.  Review of Systems  Positive:  Negative:   Physical Exam  BP (!) 151/84 (BP Location: Right Arm)   Pulse 68   Temp 98.4 F (36.9 C) (Oral)   Resp 20   SpO2 97%  Gen:   Awake, no distress   Resp:  Normal effort MSK:   Moves extremities without difficulty  Other:  Dysphagia noted with right arm weakness  Medical Decision Making  Medically screening exam initiated at 1:53 PM.  Appropriate orders placed.  RICKI CLACK was informed that the remainder of the evaluation will be completed by another provider, this initial triage assessment does not replace that evaluation, and the importance of remaining in the ED until their evaluation is complete.  Code stroke activated, neurology at bedside    Nora Lauraine LABOR, PA-C 03/10/24 1357

## 2024-03-10 NOTE — ED Provider Notes (Signed)
 [place] EMERGENCY DEPARTMENT AT [facility] Provider Note   CSN: [id number] Arrival date & time: 03/10/24  1341     Patient presents with: Code Stroke   Justin [name] is a 83 y.o. male.   The history is provided by the patient and medical records. The history is limited by the condition of the patient. No language interpreter was used.  Neurologic Problem This is a recurrent problem. The current episode started 1 to 2 hours ago. The problem occurs constantly. The problem has been gradually improving. Associated symptoms include headaches. Pertinent negatives include no chest pain, no abdominal pain and no shortness of breath. Nothing aggravates the symptoms. Nothing relieves the symptoms. He has tried nothing for the symptoms. The treatment provided no relief.       Prior to Admission medications   Medication Sig Start Date End Date Taking? Authorizing Provider  acetaminophen  (TYLENOL  8 HOUR ARTHRITIS PAIN) 650 MG CR tablet Take 1 tablet (650 mg total) by mouth 2 (two) times daily. 04/17/19   Reed, Tiffany L, DO  amLODipine  (NORVASC ) 5 MG tablet TAKE 1 TABLET BY MOUTH EVERY DAY 08/05/23   Eubanks, [name] K, NP  aspirin  EC 81 MG tablet Take 1 tablet (81 mg total) by mouth daily for 21 days. Swallow whole. 03/10/24 03/31/24  Ghimire, Donalda HERO, MD  atorvastatin  (LIPITOR) 10 MG tablet TAKE 1 TABLET BY MOUTH EVERY DAY 12/20/23   Eubanks, [name] K, NP  clopidogrel (PLAVIX) 75 MG tablet Take 1 tablet (75 mg total) by mouth daily. 03/09/24 03/09/25  Ghimire, Donalda HERO, MD  hydrochlorothiazide  (HYDRODIURIL ) 12.5 MG tablet TAKE 1 TABLET BY MOUTH EVERY DAY 12/20/23   Eubanks, [name] K, NP  losartan  (COZAAR ) 100 MG tablet Take 1 tablet (100 mg total) by mouth daily. 05/06/23   Caro Harlene POUR, NP  MYRBETRIQ  25 MG TB24 tablet TAKE 1 TABLET (25 MG TOTAL) BY MOUTH DAILY. 12/22/23   Caro Harlene POUR, NP  pantoprazole  (PROTONIX ) 40 MG tablet TAKE 1 TABLET BY MOUTH EVERY DAY 11/01/23    Eubanks, [name] K, NP  potassium chloride  (KLOR-CON ) 10 MEQ tablet TAKE 1 TABLET BY MOUTH EVERY DAY 08/10/23   Eubanks, [name] K, NP  pregabalin  (LYRICA ) 25 MG capsule TAKE 1 CAPSULE BY MOUTH THREE TIMES A DAY Patient taking differently: Take 25 mg by mouth 2 (two) times daily. 09/08/23   Eubanks, [name] K, NP  Probiotic Product (PROBIOTIC DAILY PO) Take 1 tablet by mouth daily.    [provider]  propranolol  (INDERAL ) 40 MG tablet TAKE 1 TABLET BY MOUTH TWICE A DAY 12/20/23   Eubanks, [name] K, NP    Allergies: Effexor [venlafaxine hcl], Serzone [nefazodone], and Vivactil [protriptyline hcl]    Review of Systems  Constitutional:  Negative for chills, fatigue and fever.  HENT:  Negative for congestion.   Respiratory:  Negative for cough, chest tightness and shortness of breath.   Cardiovascular:  Negative for chest pain.  Gastrointestinal:  Negative for abdominal pain, constipation, diarrhea, nausea and vomiting.  Genitourinary:  Negative for dysuria.  Musculoskeletal:  Negative for back pain.  Neurological:  Positive for speech difficulty, weakness and headaches. Negative for numbness.  Psychiatric/Behavioral:  Negative for agitation.     Updated Vital Signs BP (!) 151/84 (BP Location: Right Arm)   Pulse 68   Temp 98.4 F (36.9 C) (Oral)   Resp 20   Wt 80.2 kg   SpO2 97%   BMI 25.37 kg/m   Physical Exam Vitals and  nursing note reviewed.  Constitutional:      General: He is not in acute distress.    Appearance: He is well-developed. He is not ill-appearing, toxic-appearing or diaphoretic.  HENT:     Head: Normocephalic and atraumatic.     Nose: Nose normal.     Mouth/Throat:     Mouth: Mucous membranes are moist.  Eyes:     Extraocular Movements: Extraocular movements intact.     Conjunctiva/sclera: Conjunctivae normal.     Pupils: Pupils are equal, round, and reactive to light.  Cardiovascular:     Rate and Rhythm: Normal rate and regular rhythm.     Heart  sounds: No murmur heard. Pulmonary:     Effort: Pulmonary effort is normal. No respiratory distress.     Breath sounds: Normal breath sounds. No wheezing, rhonchi or rales.  Chest:     Chest wall: No tenderness.  Abdominal:     Palpations: Abdomen is soft.     Tenderness: There is no abdominal tenderness.  Musculoskeletal:        General: No swelling or tenderness.     Cervical back: Neck supple.  Skin:    General: Skin is warm and dry.     Capillary Refill: Capillary refill takes less than 2 seconds.     Findings: No rash.  Neurological:     Mental Status: He is alert.     Cranial Nerves: No facial asymmetry.     Sensory: No sensory deficit.     Motor: No weakness or seizure activity.     Coordination: Coordination abnormal.     Comments: Some slow dysarthric speech.  I did not appreciate unilateral weakness but he had some coordination difficulties with finger-nose-finger testing bilaterally left worse than right.  Symmetric smile.  Pupil symmetric and reactive with normal extraocular movements.  Psychiatric:        Mood and Affect: Mood normal.     (all labs ordered are listed, but only abnormal results are displayed) Labs Reviewed  CBC - Abnormal; Notable for the following components:      Result Value   WBC 15.4 (*)    All other components within normal limits  DIFFERENTIAL - Abnormal; Notable for the following components:   Neutro Abs 7.8 (*)    Lymphs Abs 5.9 (*)    Monocytes Absolute 1.4 (*)    All other components within normal limits  COMPREHENSIVE METABOLIC PANEL WITH GFR - Abnormal; Notable for the following components:   CO2 21 (*)    Glucose, Bld 149 (*)    AST 43 (*)    ALT 55 (*)    All other components within normal limits  I-STAT CHEM 8, ED - Abnormal; Notable for the following components:   Glucose, Bld 148 (*)    All other components within normal limits  CBG MONITORING, ED - Abnormal; Notable for the following components:   Glucose-Capillary 152  (*)    All other components within normal limits  PROTIME-INR  APTT  ETHANOL    EKG: None  Radiology: MR BRAIN WO CONTRAST Result Date: 03/10/2024 EXAM: MR Brain without Intravenous Contrast. CLINICAL HISTORY: Stroke, follow up. TECHNIQUE: Magnetic resonance images of the brain without intravenous contrast in multiple planes. CONTRAST: Without. COMPARISON: Head CT 03/10/2024, MRI brain 03/08/2024. FINDINGS: BRAIN: New acute infarct in the left pons. Unchanged late acute infarcts in the left occipital lobe. No acute hemorrhage or significant mass effect. Unchanged atrophy of the superior cerebellum and vermis. No midline  shift or extra-axial fluid collection. No cerebellar tonsillar ectopia. The central arterial and venous flow voids are patent. VENTRICLES: No hydrocephalus. ORBITS: The orbits are normal. SINUSES AND MASTOIDS: The sinuses and mastoid air cells are clear. BONES: No acute fracture or focal osseous lesion. IMPRESSION: 1. 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 2. Unchanged late acute infarcts in the left occipital lobe. 3. No acute hemorrhage or significant mass effect. Electronically signed by: Ryan Chess MD 03/10/2024 04:19 PM EDT RP Workstation: [id number]   CT HEAD CODE STROKE WO CONTRAST Result Date: 03/10/2024 EXAM: CT HEAD WITHOUT CONTRAST 03/10/2024 01:56:47 PM TECHNIQUE: CT of the head was performed without the administration of intravenous contrast. Automated exposure control, iterative reconstruction, and/or weight based adjustment of the mA/kV was utilized to reduce the radiation dose to as low as reasonably achievable. COMPARISON: MR head without contrast 03/08/2024. CLINICAL HISTORY: Last known normal at 11 am today. Recent discharge for acute stroke. Progressive right upper extremity weakness and dysarthria. FINDINGS: BRAIN AND VENTRICLES: No acute hemorrhage. Areas of subacute infarction are again noted in the left parietal and occipital lobe. No new or progressive  infarcts are present. Mild atrophy and white matter changes are stable. Atherosclerotic calcifications are present in the cavernous carotid arteries bilaterally. No hyperdense vessel is present. No hydrocephalus. No extra-axial collection. No mass effect or midline shift. ORBITS: Bilateral lens replacements are noted. The globes and orbits are otherwise within normal limits. SINUSES: No acute abnormality. SOFT TISSUES AND SKULL: No acute soft tissue abnormality. No skull fracture. Stat 210 pm [place] Stroke Program Early CT (ASPECT) Score ----- Ganglionic (caudate, IC, lentiform nucleus, insula, M1-M3):7 Supraganglionic (M4-M6): 3 Total: 10 IMPRESSION: 1. No acute intracranial abnormality.  ASPECTS 10/10 2. Stable subacute infarcts in the left parietal and occipital lobes without new or progressive infarcts. The pertinent results were texted to Dr. Matthews via the [facility] system at 2:09pm. Electronically signed by: Lonni Necessary MD 03/10/2024 02:11 PM EDT RP Workstation: [id number]     Procedures   CRITICAL CARE Performed by: Lonni PARAS Lorriann Hansmann Total critical care time: 20 minutes Critical care time was exclusive of separately billable procedures and treating other patients. Critical care was necessary to treat or prevent imminent or life-threatening deterioration. Critical care was time spent personally by me on the following activities: development of treatment plan with patient and/or surrogate as well as nursing, discussions with consultants, evaluation of patient's response to treatment, examination of patient, obtaining history from patient or surrogate, ordering and performing treatments and interventions, ordering and review of laboratory studies, ordering and review of radiographic studies, pulse oximetry and re-evaluation of patient's condition.  Medications Ordered in the ED  sodium chloride flush (NS) 0.9 % injection 3 mL (has no administration in time range)                                     Medical Decision Making Amount and/or Complexity of Data Reviewed Labs: ordered. Radiology: ordered.    Justin [name] is a 83 y.o. male with a past medical history significant for hypertension, dyslipidemia, GERD, previous aortic dissection, spinocerebellar ataxia, and recent admission for TIA/stroke who presents to triage and was activated as a code stroke for speech and right arm weakness.  According to caregiver report to triage, patient was last normal at 11 AM and at 12 was noticed to have markedly worsened speech and right-sided weakness in his arm.  He reportedly tumbled out of his chair but did not hit his head.  He is on Plavix.  I assessed patient when he was already in the CT scanner and he had some dysarthria and slow speech.  Pupils are symmetric and reactive with normal extraocular's.  Symmetric smile.  Speech was slow.  Abdomen nontender.  Chest nontender.  For me he had some dysmetria with his left arm and right side had some tremor.  He did seem to have symmetric strength initially for me and is reportedly improving prior.  No evidence of trauma.  He does report a 3 out of 10 headache.  Neurology is seeing patient and they recommend MRI.  Anticipate disposition for either admission or discharge based on what the MRI results and neurology recommends.  4:23 PM MRI shows new pontine stroke.  Will call for admission.      Final diagnoses:  Speech problem  Cerebrovascular accident (CVA), unspecified mechanism (HCC)     Clinical Impression: 1. Speech problem   2. Cerebrovascular accident (CVA), unspecified mechanism (HCC)     Disposition: Admit  This note was prepared with assistance of Dragon voice recognition software. Occasional wrong-word or sound-a-like substitutions may have occurred due to the inherent limitations of voice recognition software.      Nivea Wojdyla, Lonni PARAS, MD 03/10/24 [phone]

## 2024-03-10 NOTE — Plan of Care (Signed)

## 2024-03-10 NOTE — Consult Note (Addendum)
 NEUROLOGY CONSULT NOTE   Date of service: March 10, 2024 Patient Name: Justin [name] MRN:  [id number] DOB:  12/11/1940 Chief Complaint: Code Stroke Requesting Provider: Tegeler, Lonni PARAS, *  History of Present Illness  Justin [name] is a 83 y.o. 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 Patient got up earlier in the day and reports that he was trying to pull himself up at around 8 AM in the morning on 03/07/2024 when his left arm suddenly gave out. Caretaker noted that his speech was worse, left arm tingling, and he had a left facial droop. Additionally he did fall out of his wheelchair. Denies hitting his head, headache, nausea, or vomiting.  He is still on DAPT with aspirin  81 mg and Plavix 75 mg.  Admission 10/7-10/9 Right hemisphere TIA with Incidental acute left occipital ischemic infarct  Etiology:  embolic cryptogenic source Code Stroke  CT head Small age-indeterminate cortical infarct within the left occipital lobe. 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 atrophy chronic small vessel ischemic disease.  CTA head & neck no LVO  MRI  small subacute left PCA territory infarcts in the occipital lobe  2D Echo EF 70 to 75%, bilateral atria normal in size LDL 44 Recommend 30 day heart monitor  HgbA1c 6.7 VTE prophylaxis - Lovenox   aspirin  81 mg daily prior to admission, now on aspirin  81 mg daily and clopidogrel 75 mg daily for 3 weeks and then plavix alone.  LKW: 1100 Modified rankin score: 4-Needs assistance to walk and tend to bodily needs IV Thrombolysis: Recent stroke EVT: No, no LVO   NIHSS components Score: Comment  1a Level of Conscious 0[]  1[]  2[]  3[]      1b LOC Questions 0[]  1[]  2[]       1c LOC Commands 0[]  1[]  2[]       2 Best Gaze 0[]  1[]  2[]       3 Visual 0[]  1[]  2[]  3[]      4 Facial Palsy 0[]  1[]   2[]  3[]      5a Motor Arm - left 0[]  1[]  2[]  3[]  4[]  UN[]    5b Motor Arm - Right 0[]  1[]  2[]  3[]  4[]  UN[]    6a Motor Leg - Left 0[]  1[]  2[]  3[]  4[]  UN[]    6b Motor Leg - Right 0[]  1[]  2[]  3[]  4[]  UN[]    7 Limb Ataxia 0[]  1[]  2[]  UN[]      8 Sensory 0[]  1[]  2[]  UN[]      9 Best Language 0[]  1[]  2[]  3[]      10 Dysarthria 0[]  1[x]  2[]  UN[]      11 Extinct. 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   Diaphragmatic hernia without mention of obstruction or gangrene 1982   Hiatal hernia   Diplopia 2006   Disturbance of skin sensation 2003   Diverticulosis of colon (  without mention of hemorrhage) 1997   Dizziness and giddiness 2001   Dysphagia, unspecified(787.20) 1999 and   Esophagitis, unspecified 1997   GERD (gastroesophageal reflux disease) 2003   Hypertension 2003   Impotence of organic origin 1999   Lack of coordination September 07, 2007   Lumbago 2003   Migraine with aura, without mention of intractable migraine without mention of status migrainosus 1997   Myalgia and myositis, unspecified 1999   Other and unspecified hyperlipidemia 2003   Other disorders of vitreous 2003   Other malaise and fatigue 2003   Other seborrheic keratosis 2013   Other seborrheic keratosis 04/13/09   Pain in joint, site unspecified 04/13/12   Personal history of fall 04/15/11   Restless legs syndrome (RLS) 1997   Spinocerebellar disease, 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, 2008   Unspecified hearing loss 2003   Unspecified tinnitus 04/15/11    Past Surgical History:   Procedure Laterality Date   APPENDECTOMY  Age 48   boil removed     Dr. Ebbie   C3-4 anterior cervical surgery  1999   Dr. Carles   CARDIAC CATHETERIZATION  2001   Dr. Bulah   COLONOSCOPY  07/20/07   Dr. Jakie   EYE SURGERY Bilateral 2015   cataracts   LAMINOTOMY / EXCISION DISK POSTERIOR CERVICAL SPINE     Spinal Disk (?4-5)    Family History: Family History  Problem Relation Age of Onset   Heart disease Father        SCA with multiple family members with SCA   Diabetes Neg Hx    Colon cancer Neg Hx    Colon polyps Neg Hx    Esophageal cancer Neg Hx    Kidney disease Neg Hx    Gallbladder disease Neg Hx     Social History  reports that he has never smoked. He quit smokeless tobacco use about 44 years ago.  His smokeless tobacco use included chew. He reports that he does not currently use alcohol. He reports that he does not use drugs.  Allergies  Allergen Reactions   Effexor [Venlafaxine Hcl]     unknown   Serzone [Nefazodone]     unknown   Vivactil [Protriptyline Hcl]     unknown    Medications   Current Facility-Administered Medications:    sodium chloride flush (NS) 0.9 % injection 3 mL, 3 mL, Intravenous, Once, Tegeler, Lonni PARAS, MD  Current Outpatient Medications:    acetaminophen  (TYLENOL  8 HOUR ARTHRITIS PAIN) 650 MG CR tablet, Take 1 tablet (650 mg total) by mouth 2 (two) times daily., Disp: 60 tablet, Rfl: 3   amLODipine  (NORVASC ) 5 MG tablet, TAKE 1 TABLET BY MOUTH EVERY DAY, Disp: 90 tablet, Rfl: 1   aspirin  EC 81 MG tablet, Take 1 tablet (81 mg total) by mouth daily for 21 days. Swallow whole., Disp: 21 tablet, Rfl: 0   atorvastatin  (LIPITOR) 10 MG tablet, TAKE 1 TABLET BY MOUTH EVERY DAY, Disp: 90 tablet, Rfl: 1   clopidogrel (PLAVIX) 75 MG tablet, Take 1 tablet (75 mg total) by mouth daily., Disp: 30 tablet, Rfl: 11   hydrochlorothiazide  (HYDRODIURIL ) 12.5 MG tablet, TAKE 1 TABLET BY MOUTH EVERY DAY, Disp: 90 tablet, Rfl: 3    losartan  (COZAAR ) 100 MG tablet, Take 1 tablet (100 mg total) by mouth daily., Disp: 180 tablet, Rfl: 2   MYRBETRIQ  25 MG TB24 tablet, TAKE 1 TABLET (25 MG TOTAL) BY MOUTH DAILY., Disp: 90 tablet, Rfl: 1   pantoprazole  (PROTONIX ) 40 MG  tablet, TAKE 1 TABLET BY MOUTH EVERY DAY, Disp: 90 tablet, Rfl: 1   potassium chloride  (KLOR-CON ) 10 MEQ tablet, TAKE 1 TABLET BY MOUTH EVERY DAY, Disp: 90 tablet, Rfl: 3   pregabalin  (LYRICA ) 25 MG capsule, TAKE 1 CAPSULE BY MOUTH THREE TIMES A DAY (Patient taking differently: Take 25 mg by mouth 2 (two) times daily.), Disp: 270 capsule, Rfl: 1   Probiotic Product (PROBIOTIC DAILY PO), Take 1 tablet by mouth daily., Disp: , Rfl:    propranolol  (INDERAL ) 40 MG tablet, TAKE 1 TABLET BY MOUTH TWICE A DAY, Disp: 180 tablet, Rfl: 1  Vitals   Vitals:   03/10/24 1346  BP: (!) 151/84  Pulse: 68  Resp: 20  Temp: 98.4 F (36.9 C)  TempSrc: Oral  SpO2: 97%  Weight: 80.2 kg    Body mass index is 25.37 kg/m.   Physical Exam   Constitutional: Appears well-developed and well-nourished.  Psych: Affect appropriate to situation.  Eyes: No scleral injection.  HENT: No OP obstruction.  Head: Normocephalic.  Cardiovascular: Normal rate and regular rhythm.  Respiratory: Effort normal, non-labored breathing.  GI: Soft.  No distension. There is no tenderness.  Skin: WDI.   Neurologic Examination   NEURO:  Mental Status: AA&Ox3, ataxic patient is able to give clear and coherent history Speech/Language: speech is with staccato dysarthria  .  Naming, repetition, fluency, and comprehension intact.   Cranial Nerves:  II: PERRL. Visual fields full.  III, IV, VI: EOMI. Eyelids elevate symmetrically. Right nystagmus, saccadic eye movements V: Sensation is intact to light touch and symmetrical to face.  VII: Face is symmetrical resting and smiling VIII: hearing intact to voice. IX, X: Palate elevates symmetrically. Phonation is normal.  KP:Dynloizm shrug 5/5. XII:  tongue is midline without fasciculations. Motor: 5/5 strength to all muscle groups tested.  Tone: is normal and bulk is normal Sensation- Intact to light touch bilaterally. Extinction absent to light touch to DSS.   Coordination: Action tremor with dysmetria Gait- deferred  Labs/Imaging/Neurodiagnostic studies   CBC:  Recent Labs  Lab 03/12/24 1035 03/08/24 0230 03/10/24 1349 03/10/24 1353  WBC 11.6* 15.3* 15.4*  --   NEUTROABS 6.1  --  7.8*  --   HGB 15.1 14.5 14.9 14.6  HCT 44.9 43.2 44.4 43.0  MCV 90.5 89.3 90.4  --   PLT 244 225 248  --    Basic Metabolic Panel:  Lab Results  Component Value Date   NA 141 03/10/2024   K 4.2 03/10/2024   CO2 23 03/08/2024   GLUCOSE 148 (H) 03/10/2024   BUN 19 03/10/2024   CREATININE 1.20 03/10/2024   CALCIUM  9.1 03/08/2024   GFRNONAA >60 03/08/2024   GFRAA 79 04/19/2020   Lipid Panel:  Lab Results  Component Value Date   LDLCALC 44 03/08/2024   HgbA1c:  Lab Results  Component Value Date   HGBA1C 6.7 (H) 03/08/2024   Urine Drug Screen: No results found for: LABOPIA, COCAINSCRNUR, LABBENZ, AMPHETMU, THCU, LABBARB  Alcohol Level     Component Value Date/Time   ETH <15 March 12, 2024 1035   INR  Lab Results  Component Value Date   INR 0.9 03/12/2024   APTT  Lab Results  Component Value Date   APTT 27 Mar 12, 2024   AED levels: No results found for: PHENYTOIN, ZONISAMIDE, LAMOTRIGINE, LEVETIRACETA  CT Head without contrast(Personally reviewed): 1. No acute intracranial abnormality.  ASPECTS 10/10 2. Stable subacute infarcts in the left parietal and occipital lobes without new or progressive  infarcts.  MRI Brain(Personally reviewed): 1. 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 2. Unchanged late acute infarcts in the left occipital lobe. 3. No acute hemorrhage or significant mass effect.   ASSESSMENT   Justin [name] is a 83 y.o. 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  He was discharged 10/9 after an admission for a right hemispheric TIA with an incidental left occipital stroke.  He is still on DAPT with aspirin  81 mg and Plavix 75 mg.  He now has a new infarct in the left pons.  RECOMMENDATIONS   - Readmit to hospitalist service for evaluation tomorrow by the stroke service - Permissive HTN x48 hrs from sx onset goal BP <220/110. PRN labetalol or hydralazine  if BP above these parameters. Avoid oral antihypertensives. - CTA/MRA already obtained - TTE obtained earlier this week, will defer to stroke team whether this should be repeated or if TEE should be considered - No statin indicated LDL is at goal - Continue home aspirin  and palvix - q4 hr neuro checks - STAT head CT for any change in neuro exam - Tele - PT/OT/SLP - Stroke education - Amb referral to neurology upon discharge if not already made - Patient should have ambulatory monitoring after discharge  Stroke team will follow starting tomorrow.  ______________________________________________________________________    Bonney Jorene Last, NP [facility]   Attending Neurohospitalist Addendum Patient seen and examined with APP/Resident. Agree with the history and physical as documented above. Agree with the plan as documented, which I helped formulate. I have edited the note above to reflect my full findings and recommendations. I have independently reviewed the chart, obtained history, review of systems and examined the patient.I have personally reviewed pertinent head/neck/spine imaging (CT/MRI). Please feel free to call with any questions.  -- Elida Ross, MD [facility] [phone]  If 7pm- 7am, please page neurology on call as listed in AMION.

## 2024-03-10 NOTE — ED Notes (Signed)
 Patient to MRI.

## 2024-03-11 ENCOUNTER — Observation Stay (HOSPITAL_COMMUNITY)

## 2024-03-11 ENCOUNTER — Encounter (HOSPITAL_COMMUNITY): Payer: Self-pay | Admitting: Internal Medicine

## 2024-03-11 DIAGNOSIS — I63532 Cerebral infarction due to unspecified occlusion or stenosis of left posterior cerebral artery: Secondary | ICD-10-CM

## 2024-03-11 DIAGNOSIS — E785 Hyperlipidemia, unspecified: Secondary | ICD-10-CM

## 2024-03-11 DIAGNOSIS — I6329 Cerebral infarction due to unspecified occlusion or stenosis of other precerebral arteries: Secondary | ICD-10-CM

## 2024-03-11 DIAGNOSIS — E1151 Type 2 diabetes mellitus with diabetic peripheral angiopathy without gangrene: Secondary | ICD-10-CM | POA: Diagnosis not present

## 2024-03-11 DIAGNOSIS — QA00102 CACNA1A-related neurodevelopmental disorder: Secondary | ICD-10-CM

## 2024-03-11 DIAGNOSIS — R29702 NIHSS score 2: Secondary | ICD-10-CM | POA: Diagnosis not present

## 2024-03-11 DIAGNOSIS — I639 Cerebral infarction, unspecified: Secondary | ICD-10-CM

## 2024-03-11 DIAGNOSIS — K219 Gastro-esophageal reflux disease without esophagitis: Secondary | ICD-10-CM

## 2024-03-11 DIAGNOSIS — Z7982 Long term (current) use of aspirin: Secondary | ICD-10-CM

## 2024-03-11 DIAGNOSIS — Z7902 Long term (current) use of antithrombotics/antiplatelets: Secondary | ICD-10-CM

## 2024-03-11 DIAGNOSIS — G118 Other hereditary ataxias: Secondary | ICD-10-CM

## 2024-03-11 DIAGNOSIS — E119 Type 2 diabetes mellitus without complications: Secondary | ICD-10-CM | POA: Diagnosis not present

## 2024-03-11 DIAGNOSIS — I10 Essential (primary) hypertension: Secondary | ICD-10-CM | POA: Diagnosis not present

## 2024-03-11 LAB — COMPREHENSIVE METABOLIC PANEL WITH GFR
ALT: 50 U/L — ABNORMAL HIGH (ref 0–44)
AST: 32 U/L (ref 15–41)
Albumin: 3.2 g/dL — ABNORMAL LOW (ref 3.5–5.0)
Alkaline Phosphatase: 95 U/L (ref 38–126)
Anion gap: 14 (ref 5–15)
BUN: 15 mg/dL (ref 8–23)
CO2: 20 mmol/L — ABNORMAL LOW (ref 22–32)
Calcium: 9 mg/dL (ref 8.9–10.3)
Chloride: 105 mmol/L (ref 98–111)
Creatinine, Ser: 0.92 mg/dL (ref 0.61–1.24)
GFR, Estimated: >60 mL/min (ref >60)
Glucose, Bld: 130 mg/dL — ABNORMAL HIGH (ref 70–99)
Potassium: 3.6 mmol/L (ref 3.5–5.1)
Sodium: 139 mmol/L (ref 135–145)
Total Bilirubin: 1.1 mg/dL (ref 0.0–1.2)
Total Protein: 5.8 g/dL — ABNORMAL LOW (ref 6.5–8.1)

## 2024-03-11 LAB — CBC
HCT: 43 % (ref 39.0–52.0)
Hemoglobin: 14.1 g/dL (ref 13.0–17.0)
MCH: 30.3 pg (ref 26.0–34.0)
MCHC: 32.8 g/dL (ref 30.0–36.0)
MCV: 92.3 fL (ref 80.0–100.0)
Platelets: 186 K/uL (ref 150–400)
RBC: 4.66 MIL/uL (ref 4.22–5.81)
RDW: 13.2 % (ref 11.5–15.5)
WBC: 11.4 K/uL — ABNORMAL HIGH (ref 4.0–10.5)
nRBC: 0 % (ref 0.0–0.2)

## 2024-03-11 LAB — GLUCOSE, CAPILLARY
Glucose-Capillary: 138 mg/dL — ABNORMAL HIGH (ref 70–99)
Glucose-Capillary: 211 mg/dL — ABNORMAL HIGH (ref 70–99)
Glucose-Capillary: 128 mg/dL — ABNORMAL HIGH (ref 70–99)
Glucose-Capillary: 189 mg/dL — ABNORMAL HIGH (ref 70–99)

## 2024-03-11 MED ORDER — ATORVASTATIN CALCIUM 10 MG PO TABS
10.0000 mg | ORAL_TABLET | Freq: Every day | ORAL | Status: DC
  Administered 2024-03-11 – 2024-03-13 (×3): 10 mg via ORAL
  Filled 2024-03-11 (×3): qty 1

## 2024-03-11 NOTE — Evaluation (Signed)
 Occupational Therapy Evaluation Patient Details Name: Justin [name] MRN: [id number] DOB: [birth year] Today's Date: 03/11/2024   History of Present Illness   Patient is a 83 yo male presenting to the ED with worsening aphasia, L arm weakness, L facial droop and fall on 03/10/24. 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 Of note, patient discharged on 10/9 with acute infarcts in the left occipital lobe. PMH includes: hypertension, hyperlipidemia, diabetes, PAD, GERD, spinocerebellar ataxia type VI, abdominal aortic dissection, recent admission for CVA     Clinical Impressions Prior to this admission, patient living with a live-in caregiver, and requiring min A for ADLs and transfers. Patient recently here with acute infart in occipital lobe. Currently, patient with increased balance deficits on top of his baseline ataxia, requiring min-mod A for ADL management. Patient requiring min-mod A of 2 for transfers, with max cues required for hand placement and RW management. Of note, patient reporting he was seeing things on the R side of his vision in the previous admission, patient now with R sided peripheral vision impairement when assessed. OT recommending follow up with neuro-opthamologist at discharge due to increased visual deficits. OT will continue to follow acutely, recommending outpatient OT at discharge.      If plan is discharge home, recommend the following:   A lot of help with walking and/or transfers;A lot of help with bathing/dressing/bathroom;Assist for transportation;Help with stairs or ramp for entrance;Assistance with cooking/housework     Functional Status Assessment   Patient has had a recent decline in their functional status and demonstrates the ability to make significant improvements in function in a reasonable and predictable amount of time.     Equipment Recommendations   None recommended by OT     Recommendations for Other  Services         Precautions/Restrictions   Precautions Precautions: Fall;Other (comment) Recall of Precautions/Restrictions: Intact Precaution/Restrictions Comments: Baseline ataxia BP goal BP <220/110 Restrictions Weight Bearing Restrictions Per Provider Order: No     Mobility Bed Mobility Overal bed mobility: Needs Assistance Bed Mobility: Supine to Sit     Supine to sit: Contact guard     General bed mobility comments: for safety    Transfers Overall transfer level: Needs assistance Equipment used: Rolling walker (2 wheels) Transfers: Sit to/from Stand Sit to Stand: Mod assist, +2 safety/equipment, +2 physical assistance           General transfer comment: initially mod A of 2 to come into standing, with max cues needed for hand placement, posture and RW management, patient then min A of 2 for additional stands, however RLE fatiguing with patient with decreased awareness, max cues required for controlled decent into recliner      Balance Overall balance assessment: Needs assistance Sitting-balance support: Bilateral upper extremity supported, Feet supported Sitting balance-Leahy Scale: Fair Sitting balance - Comments: did not challenge Postural control: Right lateral lean Standing balance support: Bilateral upper extremity supported, During functional activity, Reliant on assistive device for balance Standing balance-Leahy Scale: Poor Standing balance comment: Reliant on RW                           ADL either performed or assessed with clinical judgement   ADL Overall ADL's : Needs assistance/impaired Eating/Feeding: Set up;Sitting   Grooming: Set up;Sitting   Upper Body Bathing: Contact guard assist;Sitting   Lower Body Bathing: Minimal assistance;Sitting/lateral leans;Sit to/from stand   Upper  Body Dressing : Contact guard assist;Sitting   Lower Body Dressing: Minimal assistance;Moderate assistance;Sit to/from stand;Sitting/lateral  leans Lower Body Dressing Details (indicate cue type and reason): due to balance deficits Toilet Transfer: Minimal assistance;Moderate assistance;+2 for physical assistance;+2 for safety/equipment;Ambulation Toilet Transfer Details (indicate cue type and reason): simulated with transfers Toileting- Clothing Manipulation and Hygiene: Moderate assistance;Sitting/lateral lean;Sit to/from stand Toileting - Clothing Manipulation Details (indicate cue type and reason): for thoroughness     Functional mobility during ADLs: Minimal assistance;Moderate assistance;+2 for safety/equipment;+2 for physical assistance;Cueing for safety;Cueing for sequencing;Rolling walker (2 wheels) General ADL Comments: Prior to this admission, patient living with a live-in caregiver, and requiring min A for ADLs and transfers. Patient recently here with acute infart in occipital lobe. Currently, patient with increased balance deficits on top of his baseline ataxia, requiring min-mod A for ADL management. Patient requiring min-mod A of 2 for transfers, with max cues required for hand placement and RW management. Of note, patient reporting he was seeing things on the R side of his vision in the previous admission, patient now with R sided peripheral vision impairement when assessed. OT recommending follow up with neuro-opthamologist at discharge due to increased visual deficits. OT will continue to follow acutely, recommending outpatient OT at discharge.     Vision Baseline Vision/History: 1 Wears glasses Ability to See in Adequate Light: 0 Adequate Patient Visual Report: Other (comment) (Patient reports seeing things on her R side) Vision Assessment?: Yes Eye Alignment: Within Functional Limits Ocular Range of Motion: Within Functional Limits Alignment/Gaze Preference: Within Defined Limits Tracking/Visual Pursuits: Able to track stimulus in all quads without difficulty Saccades: Decreased speed of saccadic  movement Convergence: Within functional limits Visual Fields: Right visual field deficit Additional Comments: Decreased R visual field, especially in the superior and inferior quadrant     Perception Perception: Impaired Preception Impairment Details: Inattention/Neglect Perception-Other Comments: RIGHT SIDED   Praxis Praxis: Impaired   Praxis-Other Comments: Baseline ataxia   Pertinent Vitals/Pain Pain Assessment Pain Assessment: Faces Faces Pain Scale: No hurt     Extremity/Trunk Assessment Upper Extremity Assessment Upper Extremity Assessment: Right hand dominant;RUE deficits/detail;LUE deficits/detail RUE Deficits / Details: ROM, strength, and sensation WFL; ataxia at baseline RUE Coordination: decreased gross motor;decreased fine motor (baseline) LUE Deficits / Details: ROM, strength, and sensation WFL; ataxia at baseline LUE Coordination: decreased gross motor;decreased fine motor (baseline)   Lower Extremity Assessment Lower Extremity Assessment: Defer to PT evaluation   Cervical / Trunk Assessment Cervical / Trunk Assessment: Kyphotic   Communication Communication Communication: Impaired Factors Affecting Communication: Difficulty expressing self;Reduced clarity of speech (Baseline)   Cognition Arousal: Alert Behavior During Therapy: WFL for tasks assessed/performed Cognition: No apparent impairments             OT - Cognition Comments: Does not appear to demonstrate any deficits, due to ataxia, all responses require increased time                 Following commands: Intact       Cueing  General Comments   Cueing Techniques: Verbal cues;Tactile cues;Visual cues  VSS on RA   Exercises     Shoulder Instructions      Home Living Family/patient expects to be discharged to:: Private residence Living Arrangements: Other (Comment) (Caregiver) Available Help at Discharge: Personal care attendant;Available 24 hours/day Type of Home: House Home  Access: Ramped entrance     Home Layout: One level     Bathroom Shower/Tub: Producer, television/film/video: Handicapped  height Bathroom Accessibility: Yes   Home Equipment: Rolling Walker (2 wheels);Wheelchair - manual;Grab bars - toilet;Grab bars - tub/shower;Hand held shower head;Shower seat;Other (comment) (grab bars/hand rails in the hallway; hand held urinal)          Prior Functioning/Environment Prior Level of Function : Needs assist             Mobility Comments: At baseline, pt ambulates with a RW ADLs Comments: At baseline, pt is Mod I to Set up for self-feeding, grooming, and dressing. Mod I to Contact guard assist for toileting and Supervision to Contact guard assist with showering. Live-in paid caregiver assists with all IADLs.    OT Problem List: Decreased activity tolerance;Impaired balance (sitting and/or standing);Impaired vision/perception;Decreased safety awareness;Decreased knowledge of use of DME or AE   OT Treatment/Interventions: Self-care/ADL training;Therapeutic exercise;Neuromuscular education;Energy conservation;DME and/or AE instruction;Manual therapy;Visual/perceptual remediation/compensation;Patient/family education;Balance training;Therapeutic activities      OT Goals(Current goals can be found in the care plan section)   Acute Rehab OT Goals Patient Stated Goal: to get better and go to therapy OT Goal Formulation: With patient Time For Goal Achievement: 03/25/24 Potential to Achieve Goals: Good   OT Frequency:  Min 2X/week    Co-evaluation              AM-PAC OT 6 Clicks Daily Activity     Outcome Measure Help from another person eating meals?: A Little Help from another person taking care of personal grooming?: A Little Help from another person toileting, which includes using toliet, bedpan, or urinal?: A Lot Help from another person bathing (including washing, rinsing, drying)?: A Little Help from another person to put on  and taking off regular upper body clothing?: A Little Help from another person to put on and taking off regular lower body clothing?: A Lot 6 Click Score: 16   End of Session Equipment Utilized During Treatment: Rolling walker (2 wheels);Gait belt Nurse Communication: Mobility status  Activity Tolerance: Patient tolerated treatment well Patient left: in chair;with call bell/phone within reach;with chair alarm set  OT Visit Diagnosis: Unsteadiness on feet (R26.81);Other abnormalities of gait and mobility (R26.89);History of falling (Z91.81);Low vision, both eyes (H54.2)                Time: 9166-9085 OT Time Calculation (min): 41 min Charges:  OT General Charges $OT Visit: 1 Visit OT Evaluation $OT Eval Moderate Complexity: 1 Mod  Ronal Gift E. Edwardo Wojnarowski, OTR/L Acute Rehabilitation Services [phone]   Ronal Gift Salt 03/11/2024, 11:32 AM

## 2024-03-11 NOTE — Assessment & Plan Note (Addendum)
 03/11/24 continue protonix  40 mg daily.  03/12/24 continue with protonix .

## 2024-03-11 NOTE — Plan of Care (Incomplete)
 No family at the bedside. Pt is lying in bed, awake, alert, eyes open, orientated to age, place, time. No aphasia but severe dysarthria with scanning speech and paucity of speech following all simple commands. Able to name and repeat in severely dysarthric voice. No gaze palsy, tracking bilaterally, visual field full. No significant facial droop. Tongue midline. Bilateral UEs 4/5, no drift. Bilaterally LEs 4/5, no drift. Sensation symmetrical bilaterally, b/l b/l ataxia on FTN with R worse than L, b/l HTS ataxia similar degree, gait not tested.

## 2024-03-11 NOTE — Assessment & Plan Note (Addendum)
 03/11/24 chronic. Followed by Dr. Evonnie with neurology.  03/12/24 chronic. Stable.

## 2024-03-11 NOTE — Plan of Care (Signed)

## 2024-03-11 NOTE — Evaluation (Signed)
 Physical Therapy Evaluation Patient Details Name: Justin [name] MRN: [id number] DOB: 1941/03/24 Today's Date: 03/11/2024  History of Present Illness  Patient is a 83 yo male presenting to the ED with worsening aphasia, L arm weakness, L facial droop and fall on 03/10/24. 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 Of note, patient discharged on 10/9 with acute infarcts in the left occipital lobe. PMH includes: hypertension, hyperlipidemia, diabetes, PAD, GERD, spinocerebellar ataxia type VI, abdominal aortic dissection, recent admission for CVA   Clinical Impression  Pt in bed upon arrival of PT, agreeable to evaluation at this time. Prior to admission the pt was ambulating with RW in his home without assistance, reports no other recent falls, and limited need for assistance from caregiver with ADLs (but pt does have 24/7 assist available). The pt presents with decreased balance (sitting and standing) compared to his baseline, and more rapid fatigue of RLE with gait resulting in hyperextension, increased sway, and mild buckling. Pt dependent on cues from therapist to complete seated rest, recommend increased assist with transfers and gait as well as OPPT after d/c to reduce risk of further falls after d/c.       If plan is discharge home, recommend the following: A lot of help with walking and/or transfers;A little help with bathing/dressing/bathroom;Assist for transportation;Help with stairs or ramp for entrance   Can travel by private vehicle        Equipment Recommendations None recommended by PT  Recommendations for Other Services       Functional Status Assessment Patient has had a recent decline in their functional status and demonstrates the ability to make significant improvements in function in a reasonable and predictable amount of time.     Precautions / Restrictions Precautions Precautions: Fall;Other (comment) Recall of  Precautions/Restrictions: Intact Precaution/Restrictions Comments: Baseline ataxia BP goal BP <220/110 Restrictions Weight Bearing Restrictions Per Provider Order: No      Mobility  Bed Mobility Overal bed mobility: Needs Assistance Bed Mobility: Supine to Sit     Supine to sit: Contact guard     General bed mobility comments: increased time, supervision for safety    Transfers Overall transfer level: Needs assistance Equipment used: Rolling walker (2 wheels) Transfers: Sit to/from Stand Sit to Stand: Mod assist, +2 safety/equipment, +2 physical assistance           General transfer comment: initially mod A of 2 to come into standing, with max cues needed for hand placement, posture and RW management, patient then min A of 2 for additional stands, however RLE fatiguing with patient with decreased awareness, max cues required for controlled decent into recliner    Ambulation/Gait Ambulation/Gait assistance: Min assist, +2 safety/equipment Gait Distance (Feet): 65 Feet Assistive device: Rolling walker (2 wheels) Gait Pattern/deviations: Ataxic, Trunk flexed, Decreased stride length, Step-through pattern, Knee hyperextension - right, Drifts right/left Gait velocity: decreased Gait velocity interpretation: <1.31 ft/sec, indicative of household ambulator   General Gait Details: pt with ataxic gait and progressive RLE fatigue, lateral lean to R with stance and progressing to hyperextension and intermittent buckling. no overt LOB but pt needing up to modA to maintain balance as he fatigued and dependent on cues from therapist for seated rest    Balance Overall balance assessment: Needs assistance Sitting-balance support: Bilateral upper extremity supported, Feet supported Sitting balance-Leahy Scale: Fair Sitting balance - Comments: did not challenge Postural control: Right lateral lean Standing balance support: Bilateral upper extremity supported, During functional  activity,  Reliant on assistive device for balance Standing balance-Leahy Scale: Poor Standing balance comment: Reliant on RW                             Pertinent Vitals/Pain Pain Assessment Pain Assessment: No/denies pain Faces Pain Scale: No hurt Pain Intervention(s): Monitored during session    Home Living Family/patient expects to be discharged to:: Private residence Living Arrangements: Other (Comment) (Caregiver) Available Help at Discharge: Personal care attendant;Available 24 hours/day Type of Home: House Home Access: Ramped entrance       Home Layout: One level Home Equipment: Agricultural consultant (2 wheels);Wheelchair - manual;Grab bars - toilet;Grab bars - tub/shower;Hand held shower head;Shower seat;Other (comment) (grab bars/hand rails in the hallway; hand held urinal)      Prior Function Prior Level of Function : Needs assist             Mobility Comments: At baseline, pt ambulates with a RW without hx of falls, does exercises from prior PT every morning ADLs Comments: At baseline, pt is Mod I to Set up for self-feeding, grooming, and dressing. Mod I to Contact guard assist for toileting and Supervision to Contact guard assist with showering. Live-in paid caregiver assists with all IADLs.     Extremity/Trunk Assessment   Upper Extremity Assessment Upper Extremity Assessment: Defer to OT evaluation RUE Deficits / Details: ROM, strength, and sensation WFL; ataxia at baseline RUE Coordination: decreased gross motor;decreased fine motor (baseline) LUE Deficits / Details: ROM, strength, and sensation WFL; ataxia at baseline LUE Coordination: decreased gross motor;decreased fine motor (baseline)    Lower Extremity Assessment Lower Extremity Assessment: RLE deficits/detail;LLE deficits/detail RLE Deficits / Details: grossly 4/5 to MMT, baseline ataxia, reports slightly decreased sensation at R hip RLE Sensation: decreased light touch RLE Coordination: decreased  fine motor;decreased gross motor LLE Deficits / Details: grossly 4/5 to MMT, no changes in sensation LLE Sensation: WNL LLE Coordination: decreased fine motor;decreased gross motor    Cervical / Trunk Assessment Cervical / Trunk Assessment: Kyphotic  Communication   Communication Communication: Impaired Factors Affecting Communication: Difficulty expressing self;Reduced clarity of speech (Baseline)    Cognition Arousal: Alert Behavior During Therapy: WFL for tasks assessed/performed   PT - Cognitive impairments: No apparent impairments                       PT - Cognition Comments: increased time for all Following commands: Intact       Cueing Cueing Techniques: Verbal cues, Tactile cues, Visual cues     General Comments General comments (skin integrity, edema, etc.): VSS on RA, daughter on phone and supportive    Exercises     Assessment/Plan    PT Assessment Patient needs continued PT services  PT Problem List Decreased strength;Decreased range of motion;Decreased activity tolerance;Decreased balance;Decreased mobility;Decreased coordination;Decreased cognition;Decreased knowledge of use of DME;Decreased safety awareness;Decreased knowledge of precautions;Cardiopulmonary status limiting activity       PT Treatment Interventions DME instruction;Gait training;Stair training;Functional mobility training;Therapeutic activities;Balance training;Therapeutic exercise;Neuromuscular re-education;Cognitive remediation;Patient/family education    PT Goals (Current goals can be found in the Care Plan section)  Acute Rehab PT Goals Patient Stated Goal: to go home PT Goal Formulation: With patient Time For Goal Achievement: 03/25/24 Potential to Achieve Goals: Good    Frequency Min 3X/week        AM-PAC PT 6 Clicks Mobility  Outcome Measure Help needed turning from your back to  your side while in a flat bed without using bedrails?: A Little Help needed moving  from lying on your back to sitting on the side of a flat bed without using bedrails?: A Little Help needed moving to and from a bed to a chair (including a wheelchair)?: A Lot Help needed standing up from a chair using your arms (e.g., wheelchair or bedside chair)?: A Lot Help needed to walk in hospital room?: A Lot Help needed climbing 3-5 steps with a railing? : Total 6 Click Score: 13    End of Session Equipment Utilized During Treatment: Gait belt Activity Tolerance: Patient tolerated treatment well Patient left: in chair;with call bell/phone within reach;with chair alarm set Nurse Communication: Mobility status PT Visit Diagnosis: Other abnormalities of gait and mobility (R26.89)    Time: 9166-9085 PT Time Calculation (min) (ACUTE ONLY): 41 min   Charges:   PT Evaluation $PT Eval Low Complexity: 1 Low PT Treatments $Gait Training: 8-22 mins PT General Charges $$ ACUTE PT VISIT: 1 Visit         Izetta Call, PT, DPT   Acute Rehabilitation Department Office [phone] Secure Chat Communication Preferred  Izetta JULIANNA Call 03/11/2024, 11:43 AM

## 2024-03-11 NOTE — Progress Notes (Addendum)
 STROKE TEAM PROGRESS NOTE    SIGNIFICANT HOSPITAL EVENTS  10/10: Presented due to slurred speech, left facial droop, left arm weakness. MRI showed new left pontine stroke.  (Prior stroke 10/8 left occipital)  INTERIM HISTORY/SUBJECTIVE  On exam today, patient continues to have some dysarthria, which is chronic due to patient's history of spinocerebellar ataxia.  Patient was placed on DAPT 10/8 after MRI finding of a left occipital lobe infarct.  He endorses compliance with his medications.  OBJECTIVE  CBC    Component Value Date/Time   WBC 11.4 (H) 03/11/2024 0530   RBC 4.66 03/11/2024 0530   HGB 14.1 03/11/2024 0530   HCT 43.0 03/11/2024 0530   PLT 186 03/11/2024 0530   MCV 92.3 03/11/2024 0530   MCH 30.3 03/11/2024 0530   MCHC 32.8 03/11/2024 0530   RDW 13.2 03/11/2024 0530   RDW 13.6 04/12/2013 1244   LYMPHSABS 5.9 (H) 03/10/2024 1349   LYMPHSABS 1.9 04/12/2013 1244   MONOABS 1.4 (H) 03/10/2024 1349   EOSABS 0.2 03/10/2024 1349   EOSABS 0.2 04/12/2013 1244   BASOSABS 0.1 03/10/2024 1349   BASOSABS 0.0 04/12/2013 1244    BMET    Component Value Date/Time   NA 139 03/11/2024 0530   NA 139 04/19/2020 1037   K 3.6 03/11/2024 0530   CL 105 03/11/2024 0530   CO2 20 (L) 03/11/2024 0530   GLUCOSE 130 (H) 03/11/2024 0530   BUN 15 03/11/2024 0530   BUN 19 04/19/2020 1037   CREATININE 0.92 03/11/2024 0530   CREATININE 0.94 06/09/2023 0909   CALCIUM  9.0 03/11/2024 0530   EGFR 81 06/09/2023 0909   GFRNONAA >60 03/11/2024 0530   GFRNONAA 73 10/04/2019 0949    IMAGING past 24 hours VAS US  LOWER EXTREMITY VENOUS (DVT) Result Date: 03/11/2024  Lower Venous DVT Study Patient Name:  Justin [name]  Date of Exam:   03/11/2024 Medical Rec #: [id number]        Accession #:    [id number] Date of Birth: 1940-12-08        Patient Gender: M Patient Age:   83 years Exam Location:  [facility] Procedure:      VAS US  LOWER EXTREMITY VENOUS (DVT) Referring Phys: Justin Sacoya Mcgourty  --------------------------------------------------------------------------------  Indications: Stroke.  Risk Factors: None identified. Limitations: Patient positioning. Comparison Study: No prior studies. Performing Technologist: Cordella Collet RVT  Examination Guidelines: A complete evaluation includes B-mode imaging, spectral Doppler, color Doppler, and power Doppler as needed of all accessible portions of each vessel. Bilateral testing is considered an integral part of a complete examination. Limited examinations for reoccurring indications may be performed as noted. The reflux portion of the exam is performed with the patient in reverse Trendelenburg.  +---------+---------------+---------+-----------+----------+--------------+ RIGHT    CompressibilityPhasicitySpontaneityPropertiesThrombus Aging +---------+---------------+---------+-----------+----------+--------------+ CFV      Full           Yes      Yes                                 +---------+---------------+---------+-----------+----------+--------------+ SFJ      Full                                                        +---------+---------------+---------+-----------+----------+--------------+ FV Prox  Full                                                        +---------+---------------+---------+-----------+----------+--------------+ FV Mid   Full                                                        +---------+---------------+---------+-----------+----------+--------------+ FV DistalFull                                                        +---------+---------------+---------+-----------+----------+--------------+ PFV      Full                                                        +---------+---------------+---------+-----------+----------+--------------+ POP      Full           Yes      Yes                                  +---------+---------------+---------+-----------+----------+--------------+ PTV      Full                                                        +---------+---------------+---------+-----------+----------+--------------+ PERO     Full                                                        +---------+---------------+---------+-----------+----------+--------------+   +---------+---------------+---------+-----------+----------+--------------+ LEFT     CompressibilityPhasicitySpontaneityPropertiesThrombus Aging +---------+---------------+---------+-----------+----------+--------------+ CFV      Full           Yes      Yes                                 +---------+---------------+---------+-----------+----------+--------------+ SFJ      Full                                                        +---------+---------------+---------+-----------+----------+--------------+ FV Prox  Full                                                        +---------+---------------+---------+-----------+----------+--------------+  FV Mid   Full                                                        +---------+---------------+---------+-----------+----------+--------------+ FV DistalFull                                                        +---------+---------------+---------+-----------+----------+--------------+ PFV      Full                                                        +---------+---------------+---------+-----------+----------+--------------+ POP      Full           Yes      Yes                                 +---------+---------------+---------+-----------+----------+--------------+ PTV      Full                                                        +---------+---------------+---------+-----------+----------+--------------+ PERO     Full                                                         +---------+---------------+---------+-----------+----------+--------------+    Summary: RIGHT: - There is no evidence of deep vein thrombosis in the lower extremity.  - No cystic structure found in the popliteal fossa.  LEFT: - There is no evidence of deep vein thrombosis in the lower extremity.  - No cystic structure found in the popliteal fossa.  *See table(s) above for measurements and observations.    Preliminary    MR BRAIN WO CONTRAST Result Date: 03/10/2024 EXAM: MR Brain without Intravenous Contrast. CLINICAL HISTORY: Stroke, follow up. TECHNIQUE: Magnetic resonance images of the brain without intravenous contrast in multiple planes. CONTRAST: Without. COMPARISON: Head CT 03/10/2024, MRI brain 03/08/2024. FINDINGS: BRAIN: New acute infarct in the left pons. Unchanged late acute infarcts in the left occipital lobe. No acute hemorrhage or significant mass effect. Unchanged atrophy of the superior cerebellum and vermis. No midline shift or extra-axial fluid collection. No cerebellar tonsillar ectopia. The central arterial and venous flow voids are patent. VENTRICLES: No hydrocephalus. ORBITS: The orbits are normal. SINUSES AND MASTOIDS: The sinuses and mastoid air cells are clear. BONES: No acute fracture or focal osseous lesion. IMPRESSION: 1. 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 2. Unchanged late acute infarcts in the left occipital lobe. 3. No acute hemorrhage or significant mass effect. Electronically signed by: Ryan Chess MD 03/10/2024 04:19 PM EDT RP Workstation: [id number]    Vitals:   03/11/24  0030 03/11/24 0502 03/11/24 0816 03/11/24 1200  BP: 131/66 131/64 128/71 (!) 146/72  Pulse: 62 65 76 84  Resp: 17 17 16 17   Temp: 97.9 F (36.6 C) 98.3 F (36.8 C) 98 F (36.7 C) 98.2 F (36.8 C)  TempSrc:    Oral  SpO2: 95% 95% 93% 96%  Weight:      Height:         PHYSICAL EXAM General:  Alert, well-nourished, well-developed patient in no acute distress Psych:  Mood and affect  appropriate for situation CV: Regular rate and rhythm on monitor Respiratory:  Regular, unlabored respirations on room air GI: Abdomen soft and nontender   NEURO:  Mental Status: AA&Ox3, patient is able to give clear and coherent history Speech/Language: Chronic dysarthria present.  Speech is without aphasia.  Naming, repetition, fluency, and comprehension intact.  Cranial Nerves:  II: PERRL. Visual fields full.  III, IV, VI: EOMI with some nystagmus noted with lateral right movement. Eyelids elevate symmetrically.  V: Sensation is intact to light touch and symmetrical to face.  VII: Face is symmetrical resting and smiling VIII: hearing intact to voice. IX, X: Chronic dysarthria present. KP:Dynloizm shrug 5/5. XII: tongue is midline without fasciculations. Motor: 5/5 strength to all muscle groups tested.  Tone: is normal and bulk is normal Sensation- Intact to light touch bilaterally. Extinction absent to light touch to DSS.   Coordination: FTN intact bilaterally, HKS: chronic ataxia present.No drift.  Gait- deferred  Most Recent NIH: 2.    ASSESSMENT/PLAN  [name] is a 83 y.o. 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  He was discharged 10/9 after an admission for a right hemispheric TIA with an incidental left occipital stroke.  He is still on DAPT with aspirin  81 mg and Plavix 75 mg.  He now has a new infarct in the left pons. NIH on Admission: 1.  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 CT questionable left PCA infarct.  Old BG infarct CTA head & neck 10/8 - Severe mid basilar artery stenosis. MRI showed left PCA small infarcts Discharged on 10/10, however  readmitted same day for right arm weakness and numbness with slurred speech Code Stroke CT head No acute abnormality. ASPECTS 10.    MRI repeat New acute infarct in the left pons. 2D Echo EF 70 to 75%, bilateral atria normal in size, no PFO LE venous Doppler no DVT Recommend loop recorder on discharge LDL 44 HgbA1c 6.7 VTE prophylaxis - lovenox  aspirin  81 mg daily and clopidogrel 75 mg daily prior to admission, now on aspirin  81 mg daily and clopidogrel 75 mg daily for 3 weeks and then plavix alone. Therapy recommendations:  Outpatient PT/OT/ST Disposition: pending  Hypertension Home meds:  losartan  100mg , propanolol 40 mg, amlodipine  5 mg, hydrochlorothiazide  12.5 mg  Stable Avoid hypotension Long-term BP goal normotensive  Hyperlipidemia Home meds: Atorvastatin  10 mg, resumed in hospital LDL 44, goal < 70 No high intensity statin given LDL at goal Continue statin at discharge  Diabetes type II, Pre-Diabetic Home meds:  none HgbA1c 6.7, goal < 7.0 CBGs SSI Recommend close follow-up with PCP for better DM control  Other Stroke Risk Factors Advanced age Migraines PAD  Other Active Problems 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 Note/plan to be edited by MD as needed.    Rocky JAYSON Likes, DNP [facility] Please use AMION for contact information & EPIC for messaging.  ATTENDING NOTE: I reviewed above note and agree with the assessment and plan. Pt was seen and examined.   No family at the bedside. Pt is lying in bed, awake, alert, eyes open, orientated to age, place, time. No aphasia but severe dysarthria with scanning speech and paucity of speech following all simple commands. Able to name and repeat in severely dysarthric voice. No gaze palsy, tracking bilaterally, visual field full. No significant facial droop. Tongue midline. Bilateral UEs 4/5, no  drift. Bilaterally LEs 4/5, no drift. Sensation symmetrical bilaterally, b/l b/l ataxia on FTN with R worse than L, b/l HTS ataxia similar degree, gait not tested.   For detailed assessment and plan, please refer to above as I have made changes wherever appropriate.   Justin Cummins, MD PhD Stroke Neurology 03/11/2024 11:38 PM    To contact Stroke Continuity provider, please refer to [email or web address]. After hours, contact General Neurology

## 2024-03-11 NOTE — Assessment & Plan Note (Addendum)
 03/11/24 on SSI.  03/12/24 stable.

## 2024-03-11 NOTE — Assessment & Plan Note (Addendum)
 03/11/24 on ASA and lipitor.  03/12/24 stable.

## 2024-03-11 NOTE — Subjective & Objective (Signed)
 Pt seen and examined. Spoke with pt's dtr debbie via phone.  Neurology wants him to get a loop recorder before discharge.

## 2024-03-11 NOTE — Hospital Course (Signed)
 CC: Abnormal speech,R arm weakness  HPI: Justin [name] is a 83 y.o. male with medical history significant of hypertension, hyperlipidemia, diabetes, PAD, GERD, spinocerebellar ataxia type VI, abdominal aortic dissection, recent admission for CVA presenting with focal neurologic deficits.   Patient was recently admitted for CVA.  Workup concerning for possible embolic etiology with plan for longer-term monitoring.  Discharged on aspirin  and Plavix.   Patient has spinocerebellar ataxia type VI and has help at home.  Today his caregiver noted between 11 AM and 12 PM patient had onset of abnormal speech and R arm weakness.  He initially noticed some numbness in his left arm where his arm felt like he was asleep.  This improved and then he noted that he had some numbness in his right arm with weakness.  Patient reported associated headache.   Denies fevers, chills, chest pain, shortness of breath, abdominal pain, constipation, diarrhea, nausea, vomiting.   ED Course: Vital signs in the ED notable for blood pressure in the 130s-150s systolic.   Lab workup included CMP with bicarb 21, glucose 149, AST 43, ALT 455.  CBC with leukocytosis to 15.4.  PT, PTT, INR within normal limits.  Ethanol level pending.   CT head showed no acute abnormality.  MRI brain showed new acute infarct.   Neurology consulted and are following.  Significant Events: Admitted 03/10/2024 for acute CVA   Admission Labs: WBC 15.4, HgB 14.9, plt 248 Na 139, K 4.2, CO2 of 21, BUN 17, Scr 1.17, glu 149 T. Prot 6.7, alb 3.8, alk phos 109, t. Bili 0.9  Admission Imaging Studies: CT head No acute intracranial abnormality.  ASPECTS 10/10 2. Stable subacute infarcts in the left parietal and occipital lobes without new or progressive infarcts. MRI brain New acute infarct in the left pons. 2. Unchanged late acute infarcts in the left occipital lobe. 3. No acute hemorrhage or significant mass effect.  Significant  Labs:   Significant Imaging Studies: Bilateral LE U/S negative for DVT  Antibiotic Therapy: Anti-infectives (From admission, onward)    None       Procedures: 03-13-2024 loop recorder placement.  Consultants: Neurology cardiology

## 2024-03-11 NOTE — Assessment & Plan Note (Addendum)
 03/11/24 on lipitor.  03/12/24 on lipitor.

## 2024-03-11 NOTE — Evaluation (Signed)
 Speech Language Pathology Evaluation Patient Details Name: Justin [name] MRN: [id number] DOB: [birth year] Today's Date: 03/11/2024 Time: 9063-9049 SLP Time Calculation (min) (ACUTE ONLY): 14 min  Problem List:  Patient Active Problem List   Diagnosis Date Noted   Acute CVA (cerebrovascular accident) (HCC) 03/10/2024   PAD (peripheral artery disease) 03/07/2024   Non-insulin dependent type 2 diabetes mellitus (HCC) 03/07/2024   Spinocerebellar ataxia type 6 (HCC) 01/22/2020   Urinary frequency 10/06/2018   Oral thrush 10/06/2018   Pure hypercholesterolemia 10/06/2018   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   Urgency incontinence 04/01/2016   History of fall 08/21/2015   Fungal dermatosis 02/20/2015   SCC (squamous cell carcinoma), 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   Internal hemorrhoids without complication 08/06/2014   Arthritis 02/21/2014   Acute focal neurological deficit 02/21/2014   Seborrheic keratosis 02/21/2014   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   Tinnitus 04/12/2013   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HCC) 10/16/2012   Impotence, 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   Essential hypertension    GERD (gastroesophageal reflux disease)    Dyslipidemia    Lumbago    Dysphagia    Past Medical History:  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   Diaphragmatic hernia without mention of obstruction or gangrene 1982   Hiatal hernia   Diplopia 2006   Disturbance of skin sensation 2003   Diverticulosis of colon (without mention of hemorrhage) 1997   Dizziness and giddiness 2001   Dysphagia, unspecified(787.20) 1999 and   Esophagitis, unspecified 1997   GERD (gastroesophageal reflux  disease) 2003   Hypertension 2003   Impotence of organic origin 1999   Lack of coordination September 07, 2007   Lumbago 2003   Migraine with aura, without mention of intractable migraine without mention of status migrainosus 1997   Myalgia and myositis, unspecified 1999   Other and unspecified hyperlipidemia 2003   Other disorders of vitreous 2003   Other malaise and fatigue 2003   Other seborrheic keratosis 2013   Other seborrheic keratosis 04/13/09   Pain in joint, site unspecified 04/13/12   Personal history of fall 04/15/11   Restless legs syndrome (RLS) 1997   Spinocerebellar disease, 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, 2008   Unspecified hearing loss 2003   Unspecified tinnitus 04/15/11   Past Surgical History:  Past Surgical History:  Procedure Laterality Date   APPENDECTOMY  Age 52   boil removed     Dr. Ebbie   C3-4 anterior cervical surgery  1999   Dr. Carles   CARDIAC CATHETERIZATION  2001   Dr. Bulah   COLONOSCOPY  07/20/07   Dr. Jakie   EYE SURGERY Bilateral 2015   cataracts   LAMINOTOMY / EXCISION DISK POSTERIOR CERVICAL SPINE     Spinal Disk (?4-5)   HPI:  Pt is a 82 y.o. male presenting with focal neurologic deficits (abnormal speech and R arm weakness). MRI (03/10/24) revealed. 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 SLP screened (03/08/24) for cog and swallow function during previous admission for CVA and no needs were identified at that time. Pt had returned to baseline and passed swallow screen, tolerated regular diet. Pt failed swallow screen during this admission s/p new CVA on  03/10/24. PMH: hypertension, hyperlipidemia, diabetes, PAD, GERD, spinocerebellar ataxia type VI, abdominal aortic dissection, recent admission for CVA.   Assessment / Plan / Recommendation Clinical Impression  Pt presents at baseline s/p CVA in the areas of speech, language and cognition. 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 As utterances lengthen  (word to sentence to conversation), speech is less intelligible. Pt also reports his speech is best in the morning after resting and progressively worsens as the day goes on. This is very characteristic of ataxic dysarthria. He independently took pauses when speaking and spoke at a slow rate which did help with overall intelligiblity in conversation. With demonstration and verbal/visual cues, he utilized over articulation of speech in simple phrases which further improved intelligibility. Educated him on speech strategies he can utlize to reduce communication breakdowns (pausing, slow rate, increase loudness, over articulation), of which he expressed understanding. He has recieved SLP services in OP setting to address dysarthria in the past, however he desired to discontinue services after a time. He is oriented x4 and followed all commands for completion of evaluation this date. Given pt is at baseline function, no further SLP f/u indicated at this time in regards to the areas noted above. Will s/o, but contiue brief f/u for swallowing.    SLP Assessment  SLP Recommendation/Assessment: Patient does not need any further Speech Language Pathology Services SLP Visit Diagnosis: Dysarthria and anarthria (R47.1)     Assistance Recommended at Discharge  None  Functional Status Assessment Patient has not had a recent decline in their functional status  Frequency and Duration min 2x/week         SLP Evaluation Cognition  Overall Cognitive Status: Within Functional Limits for tasks assessed Arousal/Alertness: Awake/alert Orientation Level: Oriented X4 Year: 2025 Month: October Day of Week: Correct Attention: Sustained Sustained Attention: Appears intact Memory: Appears intact Awareness: Appears intact       Comprehension  Auditory Comprehension Overall Auditory Comprehension: Appears within functional limits for tasks assessed Commands: Within Functional Limits Conversation: Complex Visual  Recognition/Discrimination Discrimination: Not tested    Expression Expression Primary Mode of Expression: Verbal Verbal Expression Overall Verbal Expression: Appears within functional limits for tasks assessed Written Expression Written Expression: Not tested   Oral / Motor  Oral Motor/Sensory Function Overall Oral Motor/Sensory Function: Moderate impairment (from baseline spinocerebellar ataxia) Motor Speech Overall Motor Speech: Impaired at baseline Respiration: Within functional limits Phonation: Normal Resonance: Within functional limits Articulation: Impaired Level of Impairment: Word Intelligibility: Intelligibility reduced Word: 50-74% accurate Phrase: 25-49% accurate Sentence: 25-49% accurate Conversation: 25-49% accurate Motor Planning: Within functional limits Motor Speech Errors: Aware;Inconsistent Effective Techniques: Slow rate;Over-articulate;Pause             [name], MA, CCC-SLP Acute Rehabilitation Services Office Number: [phone]  [name] 03/11/2024, 10:59 AM

## 2024-03-11 NOTE — Evaluation (Signed)
 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   Diaphragmatic hernia without mention of obstruction or gangrene 1982   Hiatal hernia   Diplopia 2006   Disturbance of skin sensation 2003   Diverticulosis of colon (without mention of hemorrhage) 1997   Dizziness and giddiness 2001   Dysphagia, unspecified(787.20) 1999 and   Esophagitis, unspecified 1997   GERD (gastroesophageal reflux disease) 2003   Hypertension 2003   Impotence of organic origin 1999   Lack of coordination September 07, 2007   Lumbago 2003   Migraine with aura, without mention of intractable migraine without mention of status migrainosus 1997   Myalgia and myositis, unspecified 1999   Other and unspecified hyperlipidemia 2003   Other disorders of vitreous 2003   Other malaise and fatigue 2003   Other seborrheic keratosis 2013   Other seborrheic keratosis 04/13/09   Pain in joint, site unspecified 04/13/12   Personal history of fall 04/15/11   Restless legs syndrome (RLS) 1997   Spinocerebellar disease, 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, 2008   Unspecified hearing loss 2003   Unspecified tinnitus 04/15/11   Past Surgical History:  Past Surgical History:  Procedure Laterality Date   APPENDECTOMY  Age 70   boil removed     Dr. Ebbie   C3-4 anterior cervical surgery  1999   Dr. Carles   CARDIAC CATHETERIZATION  2001   Dr. Bulah    COLONOSCOPY  07/20/07   Dr. Jakie   EYE SURGERY Bilateral 2015   cataracts   LAMINOTOMY / EXCISION DISK POSTERIOR CERVICAL SPINE     Spinal Disk (?4-5)   HPI:  Pt is a 83 y.o. male presenting with focal neurologic deficits (abnormal speech and R arm weakness). MRI (03/10/24) revealed. 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 SLP screened (03/08/24) for cog and swallow function during previous admission for CVA and no needs were identified at that time. Pt had returned to baseline and passed swallow screen, tolerated regular diet. Pt failed swallow screen during this admission s/p new CVA on 03/10/24. PMH: hypertension, hyperlipidemia, diabetes, PAD, GERD, spinocerebellar ataxia type VI, abdominal aortic dissection, recent admission for CVA.    Assessment / Plan / Recommendation  Clinical Impression  Pt presents at baseline function in regards to swallow function per pt and daughter who was on the phone throughout entirety of eval. 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 He reports consuming a regular diet (avoids certain dry/tough foods) at baseline, no h/o recurrent PNA. He self-fed single sips of thin liquids without overt s/sx of aspiration. When challenging pt to engage in consecutive sipping he was unable to and suspect due to reduced oromotor coordination as it relates to baseline ataxia. While pt was consuming graham cracker, clinician asked him a question and pt appeared to attempt swallowing and answering the question at the same time  and this resulted in a coughing episode x1. Daughter reports that at home he is encouraged by family/caregiver to not communicate while eating and they also refrain from asking questions while he is eating in order to avoid any issues. Daughter and pt report that at baseline he takes his time when eating, self-feeding small bites/sips, avoids talking while eating. When following these routine  precuations, he has no s/sx of aspiration or increased swallowing difficulty. Recommend resume regular diet/thin liquids, but encouraged choosing softer meal items at this time. Caregiver will be present during mealtimes to assist and supervise per daughter. Pt and family in agreement. If pt demonstrates increased or consistent s/sx of apsiration with meals/meds, contact SLP team. Will f/u for tolerance and use of swallow strategies.  SLP Visit Diagnosis: Dysphagia, unspecified (R13.10)    Aspiration Risk  Mild aspiration risk    Diet Recommendation Regular;Thin liquid    Liquid Administration via: Cup;Straw Medication Administration: Whole meds with liquid Supervision: Patient able to self feed;Intermittent supervision to cue for compensatory strategies Compensations: Minimize environmental distractions;Small sips/bites;Slow rate Postural Changes: Seated upright at 90 degrees;Remain upright for at least 30 minutes after po intake    Other  Recommendations Oral Care Recommendations: Oral care BID     Assistance Recommended at Discharge    Functional Status Assessment Patient has had a recent decline in their functional status and demonstrates the ability to make significant improvements in function in a reasonable and predictable amount of time.  Frequency and Duration min 2x/week  2 weeks       Prognosis Prognosis for improved oropharyngeal function: Good Barriers to Reach Goals: Time post onset      Swallow Study   General Date of Onset: 03/10/24 HPI: Pt is a 83 y.o. male presenting with focal neurologic deficits (abnormal speech and R arm weakness). MRI (03/10/24) revealed. 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 SLP screened (03/08/24) for cog and swallow function during previous admission for CVA and no needs were identified at that time. Pt had returned to baseline and passed swallow screen, tolerated regular diet. Pt failed swallow screen during this admission s/p new CVA on  03/10/24. PMH: hypertension, hyperlipidemia, diabetes, PAD, GERD, spinocerebellar ataxia type VI, abdominal aortic dissection, recent admission for CVA. Type of Study: Bedside Swallow Evaluation Previous Swallow Assessment: none per EMR Diet Prior to this Study: NPO Temperature Spikes Noted: No Respiratory Status: Room air History of Recent Intubation: No Behavior/Cognition: Alert;Cooperative;Pleasant mood Oral Cavity Assessment: Within Functional Limits Oral Care Completed by SLP: No Oral Cavity - Dentition: Adequate natural dentition Vision: Functional for self-feeding Self-Feeding Abilities: Able to feed self Patient Positioning: Upright in chair;Postural control adequate for testing Baseline Vocal Quality: Normal Volitional Cough: Weak Volitional Swallow: Able to elicit    Oral/Motor/Sensory Function Overall Oral Motor/Sensory Function: Moderate impairment (from  baseline spinocerebellar ataxia)   Ice Chips Ice chips: Not tested   Thin Liquid Thin Liquid: Within functional limits Presentation: Self Fed;Straw    Nectar Thick Nectar Thick Liquid: Not tested   Honey Thick Honey Thick Liquid: Not tested   Puree Puree: Within functional limits Presentation: Self Fed;Spoon   Solid     Solid: Impaired Presentation: Self Fed Oral Phase Impairments: Reduced lingual movement/coordination Oral Phase Functional Implications: Prolonged oral transit;Impaired mastication Pharyngeal Phase Impairments: Cough - Immediate       [name], MA, CCC-SLP Acute Rehabilitation Services Office Number: [phone]  [name] 03/11/2024,10:16 AM

## 2024-03-11 NOTE — Progress Notes (Signed)
 Bilateral lower extremity venous duplex has been completed. Preliminary results can be found in CV Proc through chart review.   03/11/24 10:57 AM Cathlyn Collet RVT

## 2024-03-11 NOTE — Assessment & Plan Note (Addendum)
 03/11/24 chronic. Followed by Dr. Evonnie with neurology.  03/12/24 chronic. Followed by outpatient neurology.

## 2024-03-11 NOTE — Plan of Care (Signed)
 Problem: Education: Goal: Knowledge of disease or condition will improve 03/11/2024 0608 by Lowella Sewer, RN Outcome: Progressing 03/10/2024 2331 by Lowella Sewer, RN Outcome: Progressing Goal: Knowledge of secondary prevention will improve (MUST DOCUMENT ALL) 03/11/2024 0608 by Lowella Sewer, RN Outcome: Progressing 03/10/2024 2331 by Lowella Sewer, RN Outcome: Progressing Goal: Knowledge of patient specific risk factors will improve (DELETE if not current risk factor) 03/11/2024 0608 by Lowella Sewer, RN Outcome: Progressing 03/10/2024 2331 by Lowella Sewer, RN Outcome: Progressing   Problem: Ischemic Stroke/TIA Tissue Perfusion: Goal: Complications of ischemic stroke/TIA will be minimized 03/11/2024 0608 by Lowella Sewer, RN Outcome: Progressing 03/10/2024 2331 by Lowella Sewer, RN Outcome: Progressing   Problem: Coping: Goal: Will verbalize positive feelings about self 03/11/2024 0608 by Lowella Sewer, RN Outcome: Progressing 03/10/2024 2331 by Lowella Sewer, RN Outcome: Progressing Goal: Will identify appropriate support needs 03/11/2024 0608 by Lowella Sewer, RN Outcome: Progressing 03/10/2024 2331 by Lowella Sewer, RN Outcome: Progressing   Problem: Health Behavior/Discharge Planning: Goal: Ability to manage health-related needs will improve 03/11/2024 0608 by Lowella Sewer, RN Outcome: Progressing 03/10/2024 2331 by Lowella Sewer, RN Outcome: Progressing Goal: Goals will be collaboratively established with patient/family 03/11/2024 0608 by Lowella Sewer, RN Outcome: Progressing 03/10/2024 2331 by Lowella Sewer, RN Outcome: Progressing   Problem: Self-Care: Goal: Ability to participate in self-care as condition permits will improve 03/11/2024 0608 by Lowella Sewer, RN Outcome: Progressing 03/10/2024 2331 by Lowella Sewer, RN Outcome: Progressing Goal: Verbalization of feelings and concerns over  difficulty with self-care will improve 03/11/2024 0608 by Lowella Sewer, RN Outcome: Progressing 03/10/2024 2331 by Lowella Sewer, RN Outcome: Progressing Goal: Ability to communicate needs accurately will improve 03/11/2024 0608 by Lowella Sewer, RN Outcome: Progressing 03/10/2024 2331 by Lowella Sewer, RN Outcome: Progressing   Problem: Nutrition: Goal: Risk of aspiration will decrease 03/11/2024 0608 by Lowella Sewer, RN Outcome: Progressing 03/10/2024 2331 by Lowella Sewer, RN Outcome: Progressing Goal: Dietary intake will improve 03/11/2024 9391 by Lowella Sewer, RN Outcome: Progressing 03/10/2024 2331 by Lowella Sewer, RN Outcome: Progressing   Problem: Education: Goal: Ability to describe self-care measures that may prevent or decrease complications (Diabetes Survival Skills Education) will improve 03/11/2024 0608 by Lowella Sewer, RN Outcome: Progressing 03/10/2024 2331 by Lowella Sewer, RN Outcome: Progressing Goal: Individualized Educational Video(s) 03/11/2024 0608 by Lowella Sewer, RN Outcome: Progressing 03/10/2024 2331 by Lowella Sewer, RN Outcome: Progressing   Problem: Coping: Goal: Ability to adjust to condition or change in health will improve 03/11/2024 0608 by Lowella Sewer, RN Outcome: Progressing 03/10/2024 2331 by Lowella Sewer, RN Outcome: Progressing   Problem: Fluid Volume: Goal: Ability to maintain a balanced intake and output will improve 03/11/2024 0608 by Lowella Sewer, RN Outcome: Progressing 03/10/2024 2331 by Lowella Sewer, RN Outcome: Progressing   Problem: Health Behavior/Discharge Planning: Goal: Ability to identify and utilize available resources and services will improve 03/11/2024 0608 by Lowella Sewer, RN Outcome: Progressing 03/10/2024 2331 by Lowella Sewer, RN Outcome: Progressing Goal: Ability to manage health-related needs will improve 03/11/2024 0608 by Lowella Sewer, RN Outcome: Progressing 03/10/2024 2331 by Lowella Sewer, RN Outcome: Progressing   Problem: Metabolic: Goal: Ability to maintain appropriate glucose levels will improve 03/11/2024 0608 by Lowella Sewer, RN Outcome: Progressing 03/10/2024 2331 by Lowella Sewer, RN Outcome: Progressing   Problem: Nutritional: Goal: Maintenance of adequate nutrition will improve 03/11/2024 0608 by Lowella Sewer, RN Outcome: Progressing 03/10/2024 2331 by Lowella Sewer, RN Outcome: Progressing Goal: Progress toward achieving an optimal weight will improve 03/11/2024 0608 by Lowella Sewer, RN  Outcome: Progressing 03/10/2024 2331 by Lowella Sewer, RN Outcome: Progressing   Problem: Skin Integrity: Goal: Risk for impaired skin integrity will decrease 03/11/2024 0608 by Lowella Sewer, RN Outcome: Progressing 03/10/2024 2331 by Lowella Sewer, RN Outcome: Progressing   Problem: Tissue Perfusion: Goal: Adequacy of tissue perfusion will improve 03/11/2024 0608 by Lowella Sewer, RN Outcome: Progressing 03/10/2024 2331 by Lowella Sewer, RN Outcome: Progressing   Problem: Education: Goal: Knowledge of General Education information will improve Description: Including pain rating scale, medication(s)/side effects and non-pharmacologic comfort measures 03/11/2024 0608 by Lowella Sewer, RN Outcome: Progressing 03/10/2024 2331 by Lowella Sewer, RN Outcome: Progressing   Problem: Health Behavior/Discharge Planning: Goal: Ability to manage health-related needs will improve 03/11/2024 0608 by Lowella Sewer, RN Outcome: Progressing 03/10/2024 2331 by Lowella Sewer, RN Outcome: Progressing   Problem: Clinical Measurements: Goal: Ability to maintain clinical measurements within normal limits will improve 03/11/2024 0608 by Lowella Sewer, RN Outcome: Progressing 03/10/2024 2331 by Lowella Sewer, RN Outcome: Progressing Goal: Will remain free  from infection 03/11/2024 0608 by Lowella Sewer, RN Outcome: Progressing 03/10/2024 2331 by Lowella Sewer, RN Outcome: Progressing Goal: Diagnostic test results will improve 03/11/2024 0608 by Lowella Sewer, RN Outcome: Progressing 03/10/2024 2331 by Lowella Sewer, RN Outcome: Progressing Goal: Respiratory complications will improve 03/11/2024 0608 by Lowella Sewer, RN Outcome: Progressing 03/10/2024 2331 by Lowella Sewer, RN Outcome: Progressing Goal: Cardiovascular complication will be avoided 03/11/2024 0608 by Lowella Sewer, RN Outcome: Progressing 03/10/2024 2331 by Lowella Sewer, RN Outcome: Progressing   Problem: Activity: Goal: Risk for activity intolerance will decrease 03/11/2024 0608 by Lowella Sewer, RN Outcome: Progressing 03/10/2024 2331 by Lowella Sewer, RN Outcome: Progressing   Problem: Nutrition: Goal: Adequate nutrition will be maintained 03/11/2024 0608 by Lowella Sewer, RN Outcome: Progressing 03/10/2024 2331 by Lowella Sewer, RN Outcome: Progressing   Problem: Coping: Goal: Level of anxiety will decrease 03/11/2024 0608 by Lowella Sewer, RN Outcome: Progressing 03/10/2024 2331 by Lowella Sewer, RN Outcome: Progressing   Problem: Elimination: Goal: Will not experience complications related to bowel motility 03/11/2024 0608 by Lowella Sewer, RN Outcome: Progressing 03/10/2024 2331 by Lowella Sewer, RN Outcome: Progressing Goal: Will not experience complications related to urinary retention 03/11/2024 0608 by Lowella Sewer, RN Outcome: Progressing 03/10/2024 2331 by Lowella Sewer, RN Outcome: Progressing   Problem: Pain Managment: Goal: General experience of comfort will improve and/or be controlled 03/11/2024 0608 by Lowella Sewer, RN Outcome: Progressing 03/10/2024 2331 by Lowella Sewer, RN Outcome: Progressing   Problem: Safety: Goal: Ability to remain free from injury will  improve 03/11/2024 0608 by Lowella Sewer, RN Outcome: Progressing 03/10/2024 2331 by Lowella Sewer, RN Outcome: Progressing   Problem: Skin Integrity: Goal: Risk for impaired skin integrity will decrease 03/11/2024 0608 by Lowella Sewer, RN Outcome: Progressing 03/10/2024 2331 by Lowella Sewer, RN Outcome: Progressing

## 2024-03-11 NOTE — Assessment & Plan Note (Addendum)
 03/11/24 allow for permissive HTN. Keep BP <220/110 until cleared by neurology for normotension.  03/12/24 ok for normotension now. Will restart home HTN meds. Restart norvasc , losartan  and propranolol . Stop hydrochlorothiazide . Pt has difficulty with mobility given his spinal ataxia. Better to not use diuretic to keep him from having to rush to get to bathroom.

## 2024-03-11 NOTE — Progress Notes (Signed)
 PROGRESS NOTE    TJ KITCHINGS  FMW:[id number] DOB: [birth year] DOA: 03/10/2024 PCP: Caro Harlene POUR, NP  Subjective: Patient seen and examined.  He has dysarthric but intelligible speech.  This is longstanding given his history of spinocerebellar ataxia.  He was readmitted after a second stroke yesterday.  He had been discharged on 03/09/2024.  Repeat MRI performed yesterday showed a new stroke in the left pons.  His prior stroke on 03/08/2024 was in the left occipital lobe.  Neurology has been consulted.   Hospital Course: CC: Abnormal speech,R arm weakness  HPI: Justin [name] is a 83 y.o. male with medical history significant of hypertension, hyperlipidemia, diabetes, PAD, GERD, spinocerebellar ataxia type VI, abdominal aortic dissection, recent admission for CVA presenting with focal neurologic deficits.   Patient was recently admitted for CVA.  Workup concerning for possible embolic etiology with plan for longer-term monitoring.  Discharged on aspirin  and Plavix.   Patient has spinocerebellar ataxia type VI and has help at home.  Today his caregiver noted between 11 AM and 12 PM patient had onset of abnormal speech and R arm weakness.  He initially noticed some numbness in his left arm where his arm felt like he was asleep.  This improved and then he noted that he had some numbness in his right arm with weakness.  Patient reported associated headache.   Denies fevers, chills, chest pain, shortness of breath, abdominal pain, constipation, diarrhea, nausea, vomiting.   ED Course: Vital signs in the ED notable for blood pressure in the 130s-150s systolic.   Lab workup included CMP with bicarb 21, glucose 149, AST 43, ALT 455.  CBC with leukocytosis to 15.4.  PT, PTT, INR within normal limits.  Ethanol level pending.   CT head showed no acute abnormality.  MRI brain showed new acute infarct.   Neurology consulted and are following.  Significant Events: Admitted 03/10/2024 for acute  CVA   Admission Labs: WBC 15.4, HgB 14.9, plt 248 Na 139, K 4.2, CO2 of 21, BUN 17, Scr 1.17, glu 149 T. Prot 6.7, alb 3.8, alk phos 109, t. Bili 0.9  Admission Imaging Studies: CT head No acute intracranial abnormality.  ASPECTS 10/10 2. Stable subacute infarcts in the left parietal and occipital lobes without new or progressive infarcts. MRI brain New acute infarct in the left pons. 2. Unchanged late acute infarcts in the left occipital lobe. 3. No acute hemorrhage or significant mass effect.  Significant Labs:   Significant Imaging Studies:   Antibiotic Therapy: Anti-infectives (From admission, onward)    None       Procedures:   Consultants: neurology    Assessment and Plan: * Acute CVA (cerebrovascular accident) (HCC) 03/11/24 defer to neurology for further workup as he already had a complete evaluation done just 3 days ago.  If neurology thinks that he needs a transesophageal echocardiogram,    we will be up to them to arrange this with cardiology.  He remains on aspirin  and Plavix.  Allow for permissive hypertension for now.  Until instructed by neurology that it is okay to achieve normotension.   Non-insulin dependent type 2 diabetes mellitus (HCC) 03/11/24 on SSI.   PAD (peripheral artery disease) 03/11/24 on ASA and lipitor.   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 Followed by Dr. Evonnie with neurology.   Pure hypercholesterolemia 03/11/24 on lipitor.   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 Followed by Dr. Evonnie with neurology.   GERD (gastroesophageal reflux disease) 03/11/24 continue protonix  40  mg daily.   Essential hypertension 03/11/24 allow for permissive HTN. Keep BP <220/110 until cleared by neurology for normotension.   DVT prophylaxis: enoxaparin  (LOVENOX ) injection 40 mg Start: 03/10/24 1745    Code Status: Limited: Do not attempt resuscitation (DNR) -DNR-LIMITED -Do Not Intubate/DNI  Family Communication:  no family at bedside Disposition Plan: return home Reason for continuing need for hospitalization: awaiting stroke team evaluation.  Objective: Vitals:   03/11/24 0030 03/11/24 0502 03/11/24 0816 03/11/24 1200  BP: 131/66 131/64 128/71 (!) 146/72  Pulse: 62 65 76 84  Resp: 17 17 16 17   Temp: 97.9 F (36.6 C) 98.3 F (36.8 C) 98 F (36.7 C) 98.2 F (36.8 C)  TempSrc:    Oral  SpO2: 95% 95% 93% 96%  Weight:      Height:        Intake/Output Summary (Last 24 hours) at 03/11/2024 1248 Last data filed at 03/11/2024 0300 Gross per 24 hour  Intake 226.8 ml  Output --  Net 226.8 ml   Filed Weights   03/10/24 1346 03/10/24 2134  Weight: 80.2 kg 80.7 kg    Examination:  Physical Exam Vitals and nursing note reviewed.  Constitutional:      General: He is not in acute distress.    Appearance: He is not toxic-appearing.  HENT:     Head: Normocephalic and atraumatic.  Cardiovascular:     Rate and Rhythm: Normal rate and regular rhythm.  Pulmonary:     Effort: Pulmonary effort is normal.     Breath sounds: Normal breath sounds.  Abdominal:     General: Bowel sounds are normal. There is no distension.     Palpations: Abdomen is soft.  Musculoskeletal:     Right lower leg: No edema.     Left lower leg: No edema.  Skin:    General: Skin is warm and dry.     Capillary Refill: Capillary refill takes less than 2 seconds.  Neurological:     Mental Status: He is alert and oriented to person, place, and time.     Comments: Intention tremor of his hands Dysarthric speech. Pt states he speech is normal for him. He denies any changes or worsening speech impediment.     Data Reviewed: I have personally reviewed following labs and imaging studies  CBC: Recent Labs  Lab 03/07/24 1035 03/08/24 0230 03/10/24 1349 03/10/24 1353 03/11/24 0530  WBC 11.6* 15.3* 15.4*  --  11.4*  NEUTROABS 6.1  --  7.8*  --   --   HGB 15.1 14.5 14.9 14.6 14.1  HCT 44.9 43.2 44.4 43.0 43.0  MCV  90.5 89.3 90.4  --  92.3  PLT 244 225 248  --  186   Basic Metabolic Panel: Recent Labs  Lab 03/07/24 1035 03/08/24 0230 03/10/24 1349 03/10/24 1353 03/11/24 0530  NA 138 136 139 141 139  K 4.4 3.6 4.2 4.2 3.6  CL 102 101 106 105 105  CO2 24 23 21*  --  20*  GLUCOSE 221* 154* 149* 148* 130*  BUN 18 14 17 19 15   CREATININE 1.16 1.07 1.17 1.20 0.92  CALCIUM  10.0 9.1 9.7  --  9.0   GFR: Estimated Creatinine Clearance: 59.9 mL/min (by C-G formula based on SCr of 0.92 mg/dL). Liver Function Tests: Recent Labs  Lab 03/07/24 1035 03/08/24 0230 03/10/24 1349 03/11/24 0530  AST 27 23 43* 32  ALT 44 35 55* 50*  ALKPHOS 137* 108 109 95  BILITOT 0.5  1.1 0.9 1.1  PROT 7.1 6.2* 6.7 5.8*  ALBUMIN 4.4 3.4* 3.8 3.2*   Coagulation Profile: Recent Labs  Lab 03/07/24 1035 03/10/24 1349  INR 0.9 0.9   CBG: Recent Labs  Lab 03/09/24 0736 03/10/24 1350 03/10/24 2038 03/11/24 0813 03/11/24 1147  GLUCAP 157* 152* 114* 138* 211*    Radiology Studies: MR BRAIN WO CONTRAST Result Date: 03/10/2024 EXAM: MR Brain without Intravenous Contrast. CLINICAL HISTORY: Stroke, follow up. TECHNIQUE: Magnetic resonance images of the brain without intravenous contrast in multiple planes. CONTRAST: Without. COMPARISON: Head CT 03/10/2024, MRI brain 03/08/2024. FINDINGS: BRAIN: New acute infarct in the left pons. Unchanged late acute infarcts in the left occipital lobe. No acute hemorrhage or significant mass effect. Unchanged atrophy of the superior cerebellum and vermis. No midline shift or extra-axial fluid collection. No cerebellar tonsillar ectopia. The central arterial and venous flow voids are patent. VENTRICLES: No hydrocephalus. ORBITS: The orbits are normal. SINUSES AND MASTOIDS: The sinuses and mastoid air cells are clear. BONES: No acute fracture or focal osseous lesion. IMPRESSION: 1. 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 2. Unchanged late acute infarcts in the left occipital lobe. 3. No  acute hemorrhage or significant mass effect. Electronically signed by: Ryan Chess MD 03/10/2024 04:19 PM EDT RP Workstation: [id number]   CT HEAD CODE STROKE WO CONTRAST Result Date: 03/10/2024 EXAM: CT HEAD WITHOUT CONTRAST 03/10/2024 01:56:47 PM TECHNIQUE: CT of the head was performed without the administration of intravenous contrast. Automated exposure control, iterative reconstruction, and/or weight based adjustment of the mA/kV was utilized to reduce the radiation dose to as low as reasonably achievable. COMPARISON: MR head without contrast 03/08/2024. CLINICAL HISTORY: Last known normal at 11 am today. Recent discharge for acute stroke. Progressive right upper extremity weakness and dysarthria. FINDINGS: BRAIN AND VENTRICLES: No acute hemorrhage. Areas of subacute infarction are again noted in the left parietal and occipital lobe. No new or progressive infarcts are present. Mild atrophy and white matter changes are stable. Atherosclerotic calcifications are present in the cavernous carotid arteries bilaterally. No hyperdense vessel is present. No hydrocephalus. No extra-axial collection. No mass effect or midline shift. ORBITS: Bilateral lens replacements are noted. The globes and orbits are otherwise within normal limits. SINUSES: No acute abnormality. SOFT TISSUES AND SKULL: No acute soft tissue abnormality. No skull fracture. Stat 210 pm [place] Stroke Program Early CT (ASPECT) Score ----- Ganglionic (caudate, IC, lentiform nucleus, insula, M1-M3):7 Supraganglionic (M4-M6): 3 Total: 10 IMPRESSION: 1. No acute intracranial abnormality.  ASPECTS 10/10 2. Stable subacute infarcts in the left parietal and occipital lobes without new or progressive infarcts. The pertinent results were texted to Dr. Matthews via the [facility] system at 2:09pm. Electronically signed by: Lonni Necessary MD 03/10/2024 02:11 PM EDT RP Workstation: [id number]    Scheduled Meds:  aspirin  EC  81 mg Oral QPM   atorvastatin   10  mg Oral Daily   clopidogrel  75 mg Oral q AM   enoxaparin  (LOVENOX ) injection  40 mg Subcutaneous Q24H   insulin aspart  0-15 Units Subcutaneous TID WC   mirabegron  ER  25 mg Oral q AM   pantoprazole   40 mg Oral QHS   pregabalin   25 mg Oral BID   sodium chloride flush  3 mL Intravenous Once   Continuous Infusions:  sodium chloride 40 mL/hr at 03/10/24 2119     LOS: 0 days   Time spent: 55 minutes  Camellia Door, DO  [facility]  03/11/2024, 12:48 PM

## 2024-03-11 NOTE — Care Management Obs Status (Signed)
 MEDICARE OBSERVATION STATUS NOTIFICATION   Patient Details  Name: Justin [name] MRN: [id number] Date of Birth: 12/05/1940   Medicare Observation Status Notification Given:  Yes    Carletha Spruce, RN 03/11/2024, 3:17 PM

## 2024-03-11 NOTE — Assessment & Plan Note (Signed)
 03/11/24 defer to neurology for further workup as he already had a complete evaluation done just 3 days ago.  If neurology thinks that he needs a transesophageal echocardiogram,    we will be up to them to arrange this with cardiology.  He remains on aspirin  and Plavix.  Allow for permissive hypertension for now.  Until instructed by neurology that it is okay to achieve normotension.  03/12/24 Neurology wants him to get a loop recorder before discharge. Have sent message to cardiology. Discussed with pt's dtr debbie via phone. Ok for normotension now.

## 2024-03-12 DIAGNOSIS — E119 Type 2 diabetes mellitus without complications: Secondary | ICD-10-CM | POA: Diagnosis not present

## 2024-03-12 DIAGNOSIS — R29702 NIHSS score 2: Secondary | ICD-10-CM | POA: Diagnosis not present

## 2024-03-12 DIAGNOSIS — E1151 Type 2 diabetes mellitus with diabetic peripheral angiopathy without gangrene: Secondary | ICD-10-CM | POA: Diagnosis not present

## 2024-03-12 DIAGNOSIS — I639 Cerebral infarction, unspecified: Secondary | ICD-10-CM | POA: Diagnosis not present

## 2024-03-12 DIAGNOSIS — I6329 Cerebral infarction due to unspecified occlusion or stenosis of other precerebral arteries: Secondary | ICD-10-CM | POA: Diagnosis not present

## 2024-03-12 DIAGNOSIS — K219 Gastro-esophageal reflux disease without esophagitis: Secondary | ICD-10-CM | POA: Diagnosis not present

## 2024-03-12 DIAGNOSIS — I10 Essential (primary) hypertension: Secondary | ICD-10-CM | POA: Diagnosis not present

## 2024-03-12 DIAGNOSIS — I63532 Cerebral infarction due to unspecified occlusion or stenosis of left posterior cerebral artery: Secondary | ICD-10-CM | POA: Diagnosis not present

## 2024-03-12 LAB — GLUCOSE, CAPILLARY
Glucose-Capillary: 135 mg/dL — ABNORMAL HIGH (ref 70–99)
Glucose-Capillary: 125 mg/dL — ABNORMAL HIGH (ref 70–99)
Glucose-Capillary: 182 mg/dL — ABNORMAL HIGH (ref 70–99)
Glucose-Capillary: 182 mg/dL — ABNORMAL HIGH (ref 70–99)
Glucose-Capillary: 165 mg/dL — ABNORMAL HIGH (ref 70–99)

## 2024-03-12 MED ORDER — AMLODIPINE BESYLATE 5 MG PO TABS
5.0000 mg | ORAL_TABLET | Freq: Every morning | ORAL | Status: DC
  Administered 2024-03-13: 5 mg via ORAL
  Filled 2024-03-12: qty 1

## 2024-03-12 MED ORDER — LOSARTAN POTASSIUM 50 MG PO TABS
100.0000 mg | ORAL_TABLET | Freq: Every morning | ORAL | Status: DC
  Administered 2024-03-13: 100 mg via ORAL
  Filled 2024-03-12: qty 2

## 2024-03-12 MED ORDER — PROPRANOLOL HCL 40 MG PO TABS
40.0000 mg | ORAL_TABLET | Freq: Two times a day (BID) | ORAL | Status: DC
  Administered 2024-03-12 – 2024-03-13 (×2): 40 mg via ORAL
  Filled 2024-03-12 (×2): qty 1

## 2024-03-12 NOTE — Plan of Care (Signed)

## 2024-03-12 NOTE — Progress Notes (Signed)
 PROGRESS NOTE    DUTCH ING  FMW:[id number] DOB: 04/22/1941 DOA: 03/10/2024 PCP: Caro Harlene POUR, NP  Subjective: Pt seen and examined. Spoke with pt's dtr debbie via phone.  Neurology wants him to get a loop recorder before discharge.   Hospital Course: CC: Abnormal speech,R arm weakness  HPI: FOTIOS AMOS is a 83 y.o. male with medical history significant of hypertension, hyperlipidemia, diabetes, PAD, GERD, spinocerebellar ataxia type VI, abdominal aortic dissection, recent admission for CVA presenting with focal neurologic deficits.   Patient was recently admitted for CVA.  Workup concerning for possible embolic etiology with plan for longer-term monitoring.  Discharged on aspirin  and Plavix.   Patient has spinocerebellar ataxia type VI and has help at home.  Today his caregiver noted between 11 AM and 12 PM patient had onset of abnormal speech and R arm weakness.  He initially noticed some numbness in his left arm where his arm felt like he was asleep.  This improved and then he noted that he had some numbness in his right arm with weakness.  Patient reported associated headache.   Denies fevers, chills, chest pain, shortness of breath, abdominal pain, constipation, diarrhea, nausea, vomiting.   ED Course: Vital signs in the ED notable for blood pressure in the 130s-150s systolic.   Lab workup included CMP with bicarb 21, glucose 149, AST 43, ALT 455.  CBC with leukocytosis to 15.4.  PT, PTT, INR within normal limits.  Ethanol level pending.   CT head showed no acute abnormality.  MRI brain showed new acute infarct.   Neurology consulted and are following.  Significant Events: Admitted 03/10/2024 for acute CVA   Admission Labs: WBC 15.4, HgB 14.9, plt 248 Na 139, K 4.2, CO2 of 21, BUN 17, Scr 1.17, glu 149 T. Prot 6.7, alb 3.8, alk phos 109, t. Bili 0.9  Admission Imaging Studies: CT head No acute intracranial abnormality.  ASPECTS 10/10 2. Stable subacute  infarcts in the left parietal and occipital lobes without new or progressive infarcts. MRI brain New acute infarct in the left pons. 2. Unchanged late acute infarcts in the left occipital lobe. 3. No acute hemorrhage or significant mass effect.  Significant Labs:   Significant Imaging Studies: Bilateral LE U/S negative for DVT  Antibiotic Therapy: Anti-infectives (From admission, onward)    None       Procedures:   Consultants: Neurology cardiology    Assessment and Plan: * Acute CVA (cerebrovascular accident) (HCC) 03/11/24 defer to neurology for further workup as he already had a complete evaluation done just 3 days ago.  If neurology thinks that he needs a transesophageal echocardiogram,    we will be up to them to arrange this with cardiology.  He remains on aspirin  and Plavix.  Allow for permissive hypertension for now.  Until instructed by neurology that it is okay to achieve normotension.  03/12/24 Neurology wants him to get a loop recorder before discharge. Have sent message to cardiology. Discussed with pt's dtr debbie via phone. Ok for normotension now.   Non-insulin dependent type 2 diabetes mellitus (HCC) 03/11/24 on SSI.  03/12/24 stable.   PAD (peripheral artery disease) 03/11/24 on ASA and lipitor.  03/12/24 stable.   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 Followed by Dr. Evonnie with neurology.  03/12/24 chronic. Stable.   Pure hypercholesterolemia 03/11/24 on lipitor.  03/12/24 on lipitor.   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 Followed by Dr. Evonnie with neurology.  03/12/24 chronic. Followed by outpatient neurology.  GERD (gastroesophageal reflux disease) 03/11/24 continue protonix  40 mg daily.  03/12/24 continue with protonix .  Essential hypertension 03/11/24 allow for permissive HTN. Keep BP <220/110 until cleared by neurology for normotension.  03/12/24 ok for normotension now. Will restart home HTN  meds. Restart norvasc , losartan  and propranolol . Stop hydrochlorothiazide . Pt has difficulty with mobility given his spinal ataxia. Better to not use diuretic to keep him from having to rush to get to bathroom.   DVT prophylaxis: enoxaparin  (LOVENOX ) injection 40 mg Start: 03/10/24 1745    Code Status: Limited: Do not attempt resuscitation (DNR) -DNR-LIMITED -Do Not Intubate/DNI  Family Communication: talked with pt's debbie via phone Disposition Plan: home Reason for continuing need for hospitalization: getting loop recorder tomorrow.  Objective: Vitals:   03/11/24 2116 03/12/24 0045 03/12/24 0446 03/12/24 0841  BP: 115/62 129/67 (!) 159/73 (!) 148/70  Pulse: 74 72 76 95  Resp: 18 18 18 19   Temp: 98.2 F (36.8 C) 98.1 F (36.7 C) 98.1 F (36.7 C) 98.4 F (36.9 C)  TempSrc:      SpO2: 94% 94% 95% 94%  Weight:      Height:        Intake/Output Summary (Last 24 hours) at 03/12/2024 1125 Last data filed at 03/11/2024 2025 Gross per 24 hour  Intake 103 ml  Output --  Net 103 ml   Filed Weights   03/10/24 1346 03/10/24 2134  Weight: 80.2 kg 80.7 kg    Examination:  Physical Exam Vitals and nursing note reviewed.  Constitutional:      General: He is not in acute distress.    Appearance: He is not toxic-appearing.  HENT:     Head: Normocephalic and atraumatic.  Cardiovascular:     Rate and Rhythm: Normal rate and regular rhythm.  Pulmonary:     Effort: Pulmonary effort is normal. No respiratory distress.     Breath sounds: Normal breath sounds.  Abdominal:     General: Bowel sounds are normal. There is no distension.     Palpations: Abdomen is soft.  Skin:    General: Skin is warm and dry.  Neurological:     Mental Status: He is alert.     Comments: Similar dysarthria Mild intention tremor.   Data Reviewed: I have personally reviewed following labs and imaging studies  CBC: Recent Labs  Lab 03/07/24 1035 03/08/24 0230 03/10/24 1349 03/10/24 1353  03/11/24 0530  WBC 11.6* 15.3* 15.4*  --  11.4*  NEUTROABS 6.1  --  7.8*  --   --   HGB 15.1 14.5 14.9 14.6 14.1  HCT 44.9 43.2 44.4 43.0 43.0  MCV 90.5 89.3 90.4  --  92.3  PLT 244 225 248  --  186   Basic Metabolic Panel: Recent Labs  Lab 03/07/24 1035 03/08/24 0230 03/10/24 1349 03/10/24 1353 03/11/24 0530  NA 138 136 139 141 139  K 4.4 3.6 4.2 4.2 3.6  CL 102 101 106 105 105  CO2 24 23 21*  --  20*  GLUCOSE 221* 154* 149* 148* 130*  BUN 18 14 17 19 15   CREATININE 1.16 1.07 1.17 1.20 0.92  CALCIUM  10.0 9.1 9.7  --  9.0   GFR: Estimated Creatinine Clearance: 59.9 mL/min (by C-G formula based on SCr of 0.92 mg/dL). Liver Function Tests: Recent Labs  Lab 03/07/24 1035 03/08/24 0230 03/10/24 1349 03/11/24 0530  AST 27 23 43* 32  ALT 44 35 55* 50*  ALKPHOS 137* 108 109 95  BILITOT 0.5  1.1 0.9 1.1  PROT 7.1 6.2* 6.7 5.8*  ALBUMIN 4.4 3.4* 3.8 3.2*   Coagulation Profile: Recent Labs  Lab 03/07/24 1035 03/10/24 1349  INR 0.9 0.9   CBG: Recent Labs  Lab 03/11/24 0813 03/11/24 1147 03/11/24 1625 03/11/24 2119 03/12/24 0636  GLUCAP 138* 211* 128* 189* 135*   Radiology Studies: VAS US  LOWER EXTREMITY VENOUS (DVT) Result Date: 03/11/2024  Lower Venous DVT Study Patient Name:  GRAYLING SCHRANZ  Date of Exam:   03/11/2024 Medical Rec #: [id number]        Accession #:    [id number] Date of Birth: 1941/02/17        Patient Gender: M Patient Age:   17 years Exam Location:  [facility] Procedure:      VAS US  LOWER EXTREMITY VENOUS (DVT) Referring Phys: ARY XU --------------------------------------------------------------------------------  Indications: Stroke.  Risk Factors: None identified. Limitations: Patient positioning. Comparison Study: No prior studies. Performing Technologist: Cordella Collet RVT  Examination Guidelines: A complete evaluation includes B-mode imaging, spectral Doppler, color Doppler, and power Doppler as needed of all accessible portions  of each vessel. Bilateral testing is considered an integral part of a complete examination. Limited examinations for reoccurring indications may be performed as noted. The reflux portion of the exam is performed with the patient in reverse Trendelenburg.  +---------+---------------+---------+-----------+----------+--------------+ RIGHT    CompressibilityPhasicitySpontaneityPropertiesThrombus Aging +---------+---------------+---------+-----------+----------+--------------+ CFV      Full           Yes      Yes                                 +---------+---------------+---------+-----------+----------+--------------+ SFJ      Full                                                        +---------+---------------+---------+-----------+----------+--------------+ FV Prox  Full                                                        +---------+---------------+---------+-----------+----------+--------------+ FV Mid   Full                                                        +---------+---------------+---------+-----------+----------+--------------+ FV DistalFull                                                        +---------+---------------+---------+-----------+----------+--------------+ PFV      Full                                                        +---------+---------------+---------+-----------+----------+--------------+ POP  Full           Yes      Yes                                 +---------+---------------+---------+-----------+----------+--------------+ PTV      Full                                                        +---------+---------------+---------+-----------+----------+--------------+ PERO     Full                                                        +---------+---------------+---------+-----------+----------+--------------+   +---------+---------------+---------+-----------+----------+--------------+ LEFT      CompressibilityPhasicitySpontaneityPropertiesThrombus Aging +---------+---------------+---------+-----------+----------+--------------+ CFV      Full           Yes      Yes                                 +---------+---------------+---------+-----------+----------+--------------+ SFJ      Full                                                        +---------+---------------+---------+-----------+----------+--------------+ FV Prox  Full                                                        +---------+---------------+---------+-----------+----------+--------------+ FV Mid   Full                                                        +---------+---------------+---------+-----------+----------+--------------+ FV DistalFull                                                        +---------+---------------+---------+-----------+----------+--------------+ PFV      Full                                                        +---------+---------------+---------+-----------+----------+--------------+ POP      Full           Yes      Yes                                 +---------+---------------+---------+-----------+----------+--------------+  PTV      Full                                                        +---------+---------------+---------+-----------+----------+--------------+ PERO     Full                                                        +---------+---------------+---------+-----------+----------+--------------+     Summary: RIGHT: - There is no evidence of deep vein thrombosis in the lower extremity.  - No cystic structure found in the popliteal fossa.  LEFT: - There is no evidence of deep vein thrombosis in the lower extremity.  - No cystic structure found in the popliteal fossa.  *See table(s) above for measurements and observations. Electronically signed by Gaile New MD on 03/11/2024 at 10:12:17 PM.    Final    MR BRAIN WO  CONTRAST Result Date: 03/10/2024 EXAM: MR Brain without Intravenous Contrast. CLINICAL HISTORY: Stroke, follow up. TECHNIQUE: Magnetic resonance images of the brain without intravenous contrast in multiple planes. CONTRAST: Without. COMPARISON: Head CT 03/10/2024, MRI brain 03/08/2024. FINDINGS: BRAIN: New acute infarct in the left pons. Unchanged late acute infarcts in the left occipital lobe. No acute hemorrhage or significant mass effect. Unchanged atrophy of the superior cerebellum and vermis. No midline shift or extra-axial fluid collection. No cerebellar tonsillar ectopia. The central arterial and venous flow voids are patent. VENTRICLES: No hydrocephalus. ORBITS: The orbits are normal. SINUSES AND MASTOIDS: The sinuses and mastoid air cells are clear. BONES: No acute fracture or focal osseous lesion. IMPRESSION: 1. 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 2. Unchanged late acute infarcts in the left occipital lobe. 3. No acute hemorrhage or significant mass effect. Electronically signed by: Ryan Chess MD 03/10/2024 04:19 PM EDT RP Workstation: [id number]   CT HEAD CODE STROKE WO CONTRAST Result Date: 03/10/2024 EXAM: CT HEAD WITHOUT CONTRAST 03/10/2024 01:56:47 PM TECHNIQUE: CT of the head was performed without the administration of intravenous contrast. Automated exposure control, iterative reconstruction, and/or weight based adjustment of the mA/kV was utilized to reduce the radiation dose to as low as reasonably achievable. COMPARISON: MR head without contrast 03/08/2024. CLINICAL HISTORY: Last known normal at 11 am today. Recent discharge for acute stroke. Progressive right upper extremity weakness and dysarthria. FINDINGS: BRAIN AND VENTRICLES: No acute hemorrhage. Areas of subacute infarction are again noted in the left parietal and occipital lobe. No new or progressive infarcts are present. Mild atrophy and white matter changes are stable. Atherosclerotic calcifications are present in the  cavernous carotid arteries bilaterally. No hyperdense vessel is present. No hydrocephalus. No extra-axial collection. No mass effect or midline shift. ORBITS: Bilateral lens replacements are noted. The globes and orbits are otherwise within normal limits. SINUSES: No acute abnormality. SOFT TISSUES AND SKULL: No acute soft tissue abnormality. No skull fracture. Stat 210 pm [place] Stroke Program Early CT (ASPECT) Score ----- Ganglionic (caudate, IC, lentiform nucleus, insula, M1-M3):7 Supraganglionic (M4-M6): 3 Total: 10 IMPRESSION: 1. No acute intracranial abnormality.  ASPECTS 10/10 2. Stable subacute infarcts in the left parietal and occipital lobes without new or progressive infarcts. The pertinent results were texted to Dr. Matthews  via the amion system at 2:09pm. Electronically signed by: Lonni Necessary MD 03/10/2024 02:11 PM EDT RP Workstation: [id number]    Scheduled Meds:  [START ON 03/13/2024] amLODipine   5 mg Oral q AM   aspirin  EC  81 mg Oral QPM   atorvastatin   10 mg Oral Daily   clopidogrel  75 mg Oral q AM   enoxaparin  (LOVENOX ) injection  40 mg Subcutaneous Q24H   insulin aspart  0-15 Units Subcutaneous TID WC   [START ON 03/13/2024] losartan   100 mg Oral q AM   mirabegron  ER  25 mg Oral q AM   pantoprazole   40 mg Oral QHS   pregabalin   25 mg Oral BID   propranolol   40 mg Oral BID   sodium chloride flush  3 mL Intravenous Once   Continuous Infusions:   LOS: 0 days   Time spent: 55 minutes  Camellia Door, DO  [facility]  03/12/2024, 11:25 AM

## 2024-03-12 NOTE — Progress Notes (Addendum)
 STROKE TEAM PROGRESS NOTE    SIGNIFICANT HOSPITAL EVENTS  10/10: Presented due to slurred speech, left facial droop, left arm weakness. MRI showed new left pontine stroke.  (Prior stroke 10/8 left occipital)  INTERIM HISTORY/SUBJECTIVE  Patient sitting up in bed.  No acute events overnight, no complaints at this time.  Chronic dysarthria continues.  OBJECTIVE  CBC    Component Value Date/Time   WBC 11.4 (H) 03/11/2024 0530   RBC 4.66 03/11/2024 0530   HGB 14.1 03/11/2024 0530   HCT 43.0 03/11/2024 0530   PLT 186 03/11/2024 0530   MCV 92.3 03/11/2024 0530   MCH 30.3 03/11/2024 0530   MCHC 32.8 03/11/2024 0530   RDW 13.2 03/11/2024 0530   RDW 13.6 04/12/2013 1244   LYMPHSABS 5.9 (H) 03/10/2024 1349   LYMPHSABS 1.9 04/12/2013 1244   MONOABS 1.4 (H) 03/10/2024 1349   EOSABS 0.2 03/10/2024 1349   EOSABS 0.2 04/12/2013 1244   BASOSABS 0.1 03/10/2024 1349   BASOSABS 0.0 04/12/2013 1244    BMET    Component Value Date/Time   NA 139 03/11/2024 0530   NA 139 04/19/2020 1037   K 3.6 03/11/2024 0530   CL 105 03/11/2024 0530   CO2 20 (L) 03/11/2024 0530   GLUCOSE 130 (H) 03/11/2024 0530   BUN 15 03/11/2024 0530   BUN 19 04/19/2020 1037   CREATININE 0.92 03/11/2024 0530   CREATININE 0.94 06/09/2023 0909   CALCIUM  9.0 03/11/2024 0530   EGFR 81 06/09/2023 0909   GFRNONAA >60 03/11/2024 0530   GFRNONAA 73 10/04/2019 0949    IMAGING past 24 hours No results found.   Vitals:   03/12/24 0045 03/12/24 0446 03/12/24 0841 03/12/24 1126  BP: 129/67 (!) 159/73 (!) 148/70 (!) 140/75  Pulse: 72 76 95 90  Resp: 18 18 19 18   Temp: 98.1 F (36.7 C) 98.1 F (36.7 C) 98.4 F (36.9 C) 98.7 F (37.1 C)  TempSrc:    Oral  SpO2: 94% 95% 94% 97%  Weight:      Height:         PHYSICAL EXAM General:  Alert, well-nourished, well-developed patient in no acute distress Psych:  Mood and affect appropriate for situation CV: Regular rate and rhythm on monitor Respiratory:  Regular,  unlabored respirations on room air GI: Abdomen soft and nontender   NEURO:  Mental Status: AA&Ox3, patient is able to give clear and coherent history Speech/Language: Chronic dysarthria present.  Speech is without aphasia.  Naming, repetition, fluency, and comprehension intact.  Cranial Nerves:  II: PERRL. Visual fields full.  III, IV, VI: EOMI with some nystagmus noted with lateral right movement. Eyelids elevate symmetrically.  V: Sensation is intact to light touch and symmetrical to face.  VII: Face is symmetrical resting and smiling VIII: hearing intact to voice. IX, X: Chronic dysarthria present. KP:Dynloizm shrug 5/5. XII: tongue is midline without fasciculations. Motor: 5/5 strength to all muscle groups tested.  Tone: is normal and bulk is normal Sensation- Intact to light touch bilaterally. Extinction absent to light touch to DSS.   Coordination: FTN intact bilaterally, HKS: chronic ataxia present.No drift.  Gait- deferred  Most Recent NIH: 2.    ASSESSMENT/PLAN  [name] is a 83 y.o. 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  He was discharged 10/9 after an admission for a right  hemispheric TIA with an incidental left occipital stroke.  He is still on DAPT with aspirin  81 mg and Plavix 75 mg.  He now has a new infarct in the left pons. NIH on Admission: 1.  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 CT questionable left PCA infarct.  Old BG infarct CTA head & neck 10/8 - Severe mid basilar artery stenosis. MRI showed left PCA small infarcts Discharged on 10/10, however readmitted same day for right arm weakness and numbness with slurred speech Code Stroke  CT head No acute abnormality. ASPECTS 10.    MRI repeat New acute infarct in the left pons. 2D Echo EF 70 to 75%, bilateral atria normal in size, no PFO LE venous Doppler no DVT Recommend loop recorder on discharge--to be completed monday LDL 44 HgbA1c 6.7 VTE prophylaxis - lovenox  aspirin  81 mg daily and clopidogrel 75 mg daily prior to admission, now on aspirin  81 mg daily and clopidogrel 75 mg daily for 3 weeks and then plavix alone. Therapy recommendations:  Outpatient PT/OT/ST Disposition: pending  Hypertension Home meds:  losartan  100mg , propanolol 40 mg, amlodipine  5 mg, hydrochlorothiazide  12.5 mg  Stable Avoid hypotension Long-term BP goal normotensive  Hyperlipidemia Home meds: Atorvastatin  10 mg, resumed in hospital LDL 44, goal < 70 No high intensity statin given LDL at goal Continue statin at discharge  Diabetes type II, Pre-Diabetic Home meds:  none HgbA1c 6.7, goal < 7.0 CBGs SSI Recommend close follow-up with PCP for better DM control  Other Stroke Risk Factors Advanced age Migraines PAD  Other Active Problems History of spinocerebellar ataxia type VI History of abdominal aortic dissection  Hospital day # 0  Pt seen by Neuro NP/APP and later by MD. Note/plan to be edited by MD as needed.    Rocky JAYSON Likes, DNP [facility] Please use AMION for contact information & EPIC for messaging.  ATTENDING NOTE: I reviewed above note and agree with the assessment and plan. Pt was seen and examined.   Caregiver is at the bedside. Pt sitting in the wheelchair, neuro stable. No acute event overnight. Pending loop recorder tomorrow before discharge. Continue DAPT. PT and OT recommend outpt therapy. Will follow  For detailed assessment and plan, please refer to above as I have made changes wherever appropriate.   Ary Cummins, MD PhD Stroke Neurology 03/12/2024 7:31 PM     To contact Stroke Continuity provider, please refer to [email or web address]. After  hours, contact General Neurology

## 2024-03-12 NOTE — Plan of Care (Signed)
 Problem: Education: Goal: Knowledge of disease or condition will improve 03/12/2024 2010 by Evern Monica HERO, RN Outcome: Progressing 03/12/2024 2010 by Evern Monica HERO, RN Outcome: Progressing Goal: Knowledge of secondary prevention will improve (MUST DOCUMENT ALL) 03/12/2024 2010 by Evern Monica HERO, RN Outcome: Progressing 03/12/2024 2010 by Evern Monica HERO, RN Outcome: Progressing Goal: Knowledge of patient specific risk factors will improve (DELETE if not current risk factor) 03/12/2024 2010 by Evern Monica HERO, RN Outcome: Progressing 03/12/2024 2010 by Evern Monica HERO, RN Outcome: Progressing   Problem: Ischemic Stroke/TIA Tissue Perfusion: Goal: Complications of ischemic stroke/TIA will be minimized 03/12/2024 2010 by Evern Monica HERO, RN Outcome: Progressing 03/12/2024 2010 by Evern Monica HERO, RN Outcome: Progressing   Problem: Coping: Goal: Will verbalize positive feelings about self 03/12/2024 2010 by Evern Monica HERO, RN Outcome: Progressing 03/12/2024 2010 by Evern Monica HERO, RN Outcome: Progressing Goal: Will identify appropriate support needs 03/12/2024 2010 by Evern Monica HERO, RN Outcome: Progressing 03/12/2024 2010 by Evern Monica HERO, RN Outcome: Progressing   Problem: Health Behavior/Discharge Planning: Goal: Ability to manage health-related needs will improve 03/12/2024 2010 by Evern Monica HERO, RN Outcome: Progressing 03/12/2024 2010 by Evern Monica HERO, RN Outcome: Progressing Goal: Goals will be collaboratively established with patient/family 03/12/2024 2010 by Evern Monica HERO, RN Outcome: Progressing 03/12/2024 2010 by Evern Monica HERO, RN Outcome: Progressing   Problem: Self-Care: Goal: Ability to participate in self-care as condition permits will improve 03/12/2024 2010 by Evern Monica HERO, RN Outcome: Progressing 03/12/2024 2010 by Evern Monica HERO, RN Outcome: Progressing Goal: Verbalization of feelings and  concerns over difficulty with self-care will improve 03/12/2024 2010 by Evern Monica HERO, RN Outcome: Progressing 03/12/2024 2010 by Evern Monica HERO, RN Outcome: Progressing Goal: Ability to communicate needs accurately will improve 03/12/2024 2010 by Evern Monica HERO, RN Outcome: Progressing 03/12/2024 2010 by Evern Monica HERO, RN Outcome: Progressing   Problem: Nutrition: Goal: Risk of aspiration will decrease 03/12/2024 2010 by Evern Monica HERO, RN Outcome: Progressing 03/12/2024 2010 by Evern Monica HERO, RN Outcome: Progressing Goal: Dietary intake will improve 03/12/2024 2010 by Evern Monica HERO, RN Outcome: Progressing 03/12/2024 2010 by Evern Monica HERO, RN Outcome: Progressing   Problem: Education: Goal: Ability to describe self-care measures that may prevent or decrease complications (Diabetes Survival Skills Education) will improve 03/12/2024 2010 by Evern Monica HERO, RN Outcome: Progressing 03/12/2024 2010 by Evern Monica HERO, RN Outcome: Progressing Goal: Individualized Educational Video(s) 03/12/2024 2010 by Evern Monica HERO, RN Outcome: Progressing 03/12/2024 2010 by Evern Monica HERO, RN Outcome: Progressing   Problem: Coping: Goal: Ability to adjust to condition or change in health will improve Outcome: Progressing   Problem: Fluid Volume: Goal: Ability to maintain a balanced intake and output will improve Outcome: Progressing   Problem: Health Behavior/Discharge Planning: Goal: Ability to identify and utilize available resources and services will improve Outcome: Progressing Goal: Ability to manage health-related needs will improve Outcome: Progressing   Problem: Metabolic: Goal: Ability to maintain appropriate glucose levels will improve Outcome: Progressing   Problem: Nutritional: Goal: Maintenance of adequate nutrition will improve Outcome: Progressing Goal: Progress toward achieving an optimal weight will improve Outcome:  Progressing   Problem: Skin Integrity: Goal: Risk for impaired skin integrity will decrease Outcome: Progressing   Problem: Tissue Perfusion: Goal: Adequacy of tissue perfusion will improve Outcome: Progressing   Problem: Education: Goal: Knowledge of General Education information will improve Description: Including pain rating scale, medication(s)/side effects and non-pharmacologic comfort measures Outcome: Progressing   Problem: Health  Behavior/Discharge Planning: Goal: Ability to manage health-related needs will improve Outcome: Progressing   Problem: Clinical Measurements: Goal: Ability to maintain clinical measurements within normal limits will improve Outcome: Progressing Goal: Will remain free from infection Outcome: Progressing Goal: Diagnostic test results will improve Outcome: Progressing Goal: Respiratory complications will improve Outcome: Progressing Goal: Cardiovascular complication will be avoided Outcome: Progressing   Problem: Activity: Goal: Risk for activity intolerance will decrease Outcome: Progressing   Problem: Nutrition: Goal: Adequate nutrition will be maintained Outcome: Progressing   Problem: Coping: Goal: Level of anxiety will decrease Outcome: Progressing   Problem: Elimination: Goal: Will not experience complications related to bowel motility Outcome: Progressing Goal: Will not experience complications related to urinary retention Outcome: Progressing   Problem: Pain Managment: Goal: General experience of comfort will improve and/or be controlled Outcome: Progressing   Problem: Safety: Goal: Ability to remain free from injury will improve Outcome: Progressing   Problem: Skin Integrity: Goal: Risk for impaired skin integrity will decrease Outcome: Progressing

## 2024-03-13 ENCOUNTER — Encounter (HOSPITAL_COMMUNITY)
Admission: EM | Disposition: A | Discharge status: Home / Self Care | Payer: Self-pay | Source: Home / Self Care | Attending: Emergency Medicine

## 2024-03-13 DIAGNOSIS — R29702 NIHSS score 2: Secondary | ICD-10-CM | POA: Diagnosis not present

## 2024-03-13 DIAGNOSIS — I10 Essential (primary) hypertension: Secondary | ICD-10-CM | POA: Diagnosis not present

## 2024-03-13 DIAGNOSIS — K219 Gastro-esophageal reflux disease without esophagitis: Secondary | ICD-10-CM | POA: Diagnosis not present

## 2024-03-13 DIAGNOSIS — I63532 Cerebral infarction due to unspecified occlusion or stenosis of left posterior cerebral artery: Secondary | ICD-10-CM | POA: Diagnosis not present

## 2024-03-13 DIAGNOSIS — I639 Cerebral infarction, unspecified: Secondary | ICD-10-CM | POA: Diagnosis not present

## 2024-03-13 DIAGNOSIS — E119 Type 2 diabetes mellitus without complications: Secondary | ICD-10-CM | POA: Diagnosis not present

## 2024-03-13 DIAGNOSIS — I6329 Cerebral infarction due to unspecified occlusion or stenosis of other precerebral arteries: Secondary | ICD-10-CM | POA: Diagnosis not present

## 2024-03-13 DIAGNOSIS — E1151 Type 2 diabetes mellitus with diabetic peripheral angiopathy without gangrene: Secondary | ICD-10-CM | POA: Diagnosis not present

## 2024-03-13 HISTORY — PX: LOOP RECORDER INSERTION: EP1214

## 2024-03-13 LAB — GLUCOSE, CAPILLARY
Glucose-Capillary: 136 mg/dL — ABNORMAL HIGH (ref 70–99)
Glucose-Capillary: 136 mg/dL — ABNORMAL HIGH (ref 70–99)

## 2024-03-13 SURGERY — LOOP RECORDER INSERTION

## 2024-03-13 MED ORDER — LIDOCAINE-EPINEPHRINE 1 %-1:100000 IJ SOLN
INTRAMUSCULAR | Status: AC
  Filled 2024-03-13: qty 1

## 2024-03-13 MED ORDER — LIDOCAINE-EPINEPHRINE 1 %-1:100000 IJ SOLN
INTRAMUSCULAR | Status: DC | PRN
  Administered 2024-03-13: 10 mL

## 2024-03-13 SURGICAL SUPPLY — 2 items
MONITOR REVEAL LINQ II (Prosthesis & Implant Heart) IMPLANT
PACK LOOP INSERTION (CUSTOM PROCEDURE TRAY) ×2 IMPLANT

## 2024-03-13 NOTE — Progress Notes (Signed)
 Transition of Care [facility]) - Inpatient Brief Assessment   Patient Details  Name: ORVILL COULTHARD MRN: [id number] Date of Birth: [birth year]  [facility]) CM/SW Contact:    Rosaline JONELLE Joe, RN Phone Number: 03/13/2024, 2:07 PM   Clinical Narrative: CM met with the patient at the bedside and offered choice regarding OUtpatient therapy and patient requested referral be sent to Outpatient therapy at [facility] location.  Outpatient order placed for PT and MD requested to co-sign.  Patient's family at the bedside and they plans to provide transportation to home today via personal care.  No other IP Care management needs.   Transition of Care Asessment: Insurance and Status: (P) Insurance coverage has been reviewed Patient has primary care physician: (P) Yes Home environment has been reviewed: (P) Yes Prior level of function:: (P) Modified independent Prior/Current Home Services: (P) Current home services (DME at the home includes RW, WC, and shower seat) Social Drivers of Health Review: (P) SDOH reviewed interventions complete Readmission risk has been reviewed: (P) Yes Transition of care needs: (P) transition of care needs identified, TOC will continue to follow

## 2024-03-13 NOTE — Progress Notes (Signed)
 SLP Cancellation Note  Patient Details Name: Justin [name] MRN: [id number] DOB: 1941/04/16   Cancelled treatment:       Reason Eval/Treat Not Completed: Patient at procedure or test/unavailable. SLP will f/u as able.    Damien Blumenthal, M.A., CCC-SLP Speech Language Pathology, Acute Rehabilitation Services  Secure Chat preferred [phone]  03/13/2024, 12:31 PM

## 2024-03-13 NOTE — Progress Notes (Signed)
 Patient received discharged instructions regarding loop recorder, follow up appointments and medications. Patient verbalized understanding of instructions. Pt left unit in wheelchair with son and daughter.

## 2024-03-13 NOTE — Progress Notes (Signed)
 STROKE TEAM PROGRESS NOTE    SIGNIFICANT HOSPITAL EVENTS  10/10: Presented due to slurred speech, left facial droop, left arm weakness. MRI showed new left pontine stroke.  (Prior stroke 10/8 left occipital)  INTERIM HISTORY/SUBJECTIVE No acute event overnight, neuro stable. Had loop recorder placed today.   OBJECTIVE  CBC    Component Value Date/Time   WBC 11.4 (H) 03/11/2024 0530   RBC 4.66 03/11/2024 0530   HGB 14.1 03/11/2024 0530   HCT 43.0 03/11/2024 0530   PLT 186 03/11/2024 0530   MCV 92.3 03/11/2024 0530   MCH 30.3 03/11/2024 0530   MCHC 32.8 03/11/2024 0530   RDW 13.2 03/11/2024 0530   RDW 13.6 04/12/2013 1244   LYMPHSABS 5.9 (H) 03/10/2024 1349   LYMPHSABS 1.9 04/12/2013 1244   MONOABS 1.4 (H) 03/10/2024 1349   EOSABS 0.2 03/10/2024 1349   EOSABS 0.2 04/12/2013 1244   BASOSABS 0.1 03/10/2024 1349   BASOSABS 0.0 04/12/2013 1244    BMET    Component Value Date/Time   NA 139 03/11/2024 0530   NA 139 04/19/2020 1037   K 3.6 03/11/2024 0530   CL 105 03/11/2024 0530   CO2 20 (L) 03/11/2024 0530   GLUCOSE 130 (H) 03/11/2024 0530   BUN 15 03/11/2024 0530   BUN 19 04/19/2020 1037   CREATININE 0.92 03/11/2024 0530   CREATININE 0.94 06/09/2023 0909   CALCIUM  9.0 03/11/2024 0530   EGFR 81 06/09/2023 0909   GFRNONAA >60 03/11/2024 0530   GFRNONAA 73 10/04/2019 0949    IMAGING past 24 hours EP PPM/ICD IMPLANT Result Date: 03/13/2024 SURGEON:  Will Gladis Norton, MD   PREPROCEDURE DIAGNOSIS:  Cryptogenic stroke   POSTPROCEDURE DIAGNOSIS: Cryptogenic stroke    PROCEDURES:  1. Implantable loop recorder implantation   INTRODUCTION:  Justin [name] presents with a history of cryptogenic stroke The costs of loop recorder monitoring have been discussed with the patient. Appropriate time out was performed prior to the procedure.   DESCRIPTION OF PROCEDURE:  Informed written consent was obtained.  The patient required no sedation for the procedure today.  Mapping over  the patient's chest was performed to identify the area where electrograms were most prominent for ILR recording.  This area was found to be the left parasternal region over the 4th intercostal space. The patients left chest was therefore prepped and draped in the usual sterile fashion. The skin overlying the left parasternal region was infiltrated with lidocaine for local analgesia.  A 0.5-cm incision was made over the left parasternal region over the 3rd intercostal space.  A subcutaneous ILR pocket was fashioned using a combination of sharp and blunt dissection.  A Med[employer]onic Reveal LINQ 2 (serial # [id number]) implantable loop recorder was then placed into the pocket  R waves were very prominent and measured 0.37mV.  Steri- Strips and a sterile dressing were then applied.  There were no early apparent complications.   CONCLUSIONS:  1. Successful implantation of a implantable loop recorder for a history of cryptogenic stroke  2. No early apparent complications. Will Gladis Norton, MD 03/13/2024 12:34 PM     Vitals:   03/13/24 0052 03/13/24 0751 03/13/24 1134 03/13/24 1314  BP: (!) 140/75 (!) 153/81 (!) 148/77 (!) 145/85  Pulse: 66 70 68 67  Resp:  19 19 19   Temp: 98.1 F (36.7 C) 98.4 F (36.9 C) 98.5 F (36.9 C)   TempSrc:      SpO2: 93% 96% 93% 98%  Weight:  Height:         PHYSICAL EXAM General:  Alert, well-nourished, well-developed patient in no acute distress Psych:  Mood and affect appropriate for situation CV: Regular rate and rhythm on monitor Respiratory:  Regular, unlabored respirations on room air GI: Abdomen soft and nontender   NEURO:  Mental Status: AA&Ox3, patient is able to give clear and coherent history Speech/Language: Chronic dysarthria present.  Speech is without aphasia.  Naming, repetition, fluency, and comprehension intact.  Cranial Nerves:  II: PERRL. Visual fields full.  III, IV, VI: EOMI with some nystagmus noted with lateral right movement. Eyelids  elevate symmetrically.  V: Sensation is intact to light touch and symmetrical to face.  VII: Face is symmetrical resting and smiling VIII: hearing intact to voice. IX, X: Chronic dysarthria present. KP:Dynloizm shrug 5/5. XII: tongue is midline without fasciculations. Motor: 5/5 strength to all muscle groups tested.  Tone: is normal and bulk is normal Sensation- Intact to light touch bilaterally. Extinction absent to light touch to DSS.   Coordination: FTN intact bilaterally, HKS: chronic ataxia present.No drift.  Gait- deferred  Most Recent NIH: 2.    ASSESSMENT/PLAN  [name] is a 83 y.o. 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  He was discharged 10/9 after an admission for a right hemispheric TIA with an incidental left occipital stroke.  He is still on DAPT with aspirin  81 mg and Plavix 75 mg.  He now has a new infarct in the left pons. NIH on Admission: 1.  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 CT questionable left PCA infarct.  Old BG infarct CTA head & neck 10/8 - Severe mid basilar artery stenosis. MRI showed left PCA small infarcts Discharged on 10/10, however readmitted same day for right arm weakness and numbness with slurred speech Code Stroke CT head No acute abnormality. ASPECTS 10.    MRI repeat New acute infarct in the left pons. 2D Echo EF 70 to 75%, bilateral atria normal in size, no PFO LE venous Doppler no DVT Loop recorder placed LDL 44 HgbA1c 6.7 VTE prophylaxis - lovenox  aspirin  81 mg daily and clopidogrel 75 mg daily prior to admission, now on aspirin  81 mg daily and clopidogrel 75 mg daily for 3 weeks and then plavix alone. Therapy  recommendations:  Outpatient PT/OT/ST Disposition: pending  Hypertension Home meds:  losartan  100mg , propanolol 40 mg, amlodipine  5 mg, hydrochlorothiazide  12.5 mg  Stable Avoid hypotension Long-term BP goal normotensive  Hyperlipidemia Home meds: Atorvastatin  10 mg, resumed in hospital LDL 44, goal < 70 No high intensity statin given LDL at goal Continue home statin at discharge  Diabetes type II, Pre-Diabetic Home meds:  none HgbA1c 6.7, goal < 7.0 CBGs SSI Recommend close follow-up with PCP for better DM control  Other Stroke Risk Factors Advanced age Migraines PAD  Other Active Problems 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 Please call with questions. Pt will follow up with Dr. Evonnie at [facility] in about 4 weeks. Thanks for the consult.   Ary Cummins, MD PhD Stroke Neurology 03/13/2024 1:43 PM     To contact Stroke Continuity  provider, please refer to [email or web address]. After hours, contact General Neurology

## 2024-03-13 NOTE — Consult Note (Addendum)
 ELECTROPHYSIOLOGY CONSULT NOTE  Patient ID: Justin [name] MRN: [id number], DOB/AGE: March 06, 1941   Admit date: 03/10/2024 Date of Consult: 03/13/2024  Primary Physician: Caro Harlene POUR, NP Primary Cardiologist: Maude Emmer, MD  Primary Electrophysiologist: New to Dr. Inocencio Reason for Consultation: Cryptogenic stroke; recommendations regarding Implantable Loop Recorder Insurance: [facility] ACO   History of Present Illness: EP has been asked to evaluate Justin [name] for placement of an implantable loop recorder to monitor for atrial fibrillation by Dr Jerri.  The patient was admitted on 10/7-10/9/25 with R hemisphere TIA & incidental subacute L PCA territory infarct in the occipital lobe. He was readmitted 03/10/2024 with new infarct in L pons.    The pt has undergone workup for stroke including:  Stroke:  left pontine and left PCA small scattered infarcts, etiology: concerning for cardioembolic source  89/06/7972 admitted for slurred speech, left arm weakness and left facial droop, fall off wheelchair. CT questionable left PCA infarct.  Old BG infarct CTA head & neck 10/8 - Severe mid basilar artery stenosis. MRI showed left PCA small infarcts Discharged on 10/10, however readmitted same day for right arm weakness and numbness with slurred speech Code Stroke CT head No acute abnormality. ASPECTS 10.    MRI repeat New acute infarct in the left pons. 2D Echo EF 70 to 75%, bilateral atria normal in size, no PFO LE venous Doppler no DVT LDL 44 HgbA1c 6.7   The patient has been monitored on telemetry which has demonstrated sinus rhythm with no arrhythmias.  Inpatient stroke work-up Anamari Galeas not require a TEE per Neurology.   Echocardiogram as above. Lab work is reviewed.  Prior to admission, the patient denies chest pain, shortness of breath, dizziness, palpitations, or syncope.  He is recovering from his stroke with plans to return home  at discharge.  Allergies, Past Medical,  Surgical, Social, and Family Histories have been reviewed and are referenced here-in when relevant for medical decision making.   Inpatient Medications:   amLODipine   5 mg Oral q AM   aspirin  EC  81 mg Oral QPM   atorvastatin   10 mg Oral Daily   clopidogrel  75 mg Oral q AM   enoxaparin  (LOVENOX ) injection  40 mg Subcutaneous Q24H   insulin aspart  0-15 Units Subcutaneous TID WC   losartan   100 mg Oral q AM   mirabegron  ER  25 mg Oral q AM   pantoprazole   40 mg Oral QHS   pregabalin   25 mg Oral BID   propranolol   40 mg Oral BID   sodium chloride flush  3 mL Intravenous Once    Physical Exam: Vitals:   03/12/24 1126 03/12/24 2042 03/13/24 0052 03/13/24 0751  BP: (!) 140/75 (!) 142/73 (!) 140/75 (!) 153/81  Pulse: 90 91 66 70  Resp: 18   19  Temp: 98.7 F (37.1 C) 98.6 F (37 C) 98.1 F (36.7 C) 98.4 F (36.9 C)  TempSrc: Oral     SpO2: 97% 94% 93% 96%  Weight:      Height:        GEN- NAD. A&O x 3. Normal affect. HEENT: Normocephalic, atraumatic Lungs- CTAB, Normal effort.  Heart- Regular rate and rhythm rate and rhythm. No M/G/R.  Extremities- No peripheral edema. no clubbing or cyanosis Skin- warm and dry, no rash or lesion. Neuro: awake, alert, oriented to self / situation,    12-lead ECG 03/07/24 > SR 72 bpm (personally reviewed) All prior EKG's in EPIC reviewed  with no documented atrial fibrillation  Telemetry SB 50's-SR 90's (personally reviewed)  Assessment and Plan:  Cryptogenic stroke The patient presents with cryptogenic stroke.  The patient does not have a TEE planned for this AM.  I spoke at length with the patient about monitoring for afib with an implantable loop recorder, including monthly monitor fees which may range from $0-$40.  Risks, benefits, and alteratives to implantable loop recorder were discussed with the patient today.  At this time, the patient is very clear in their decision to proceed with implantable loop recorder.    Wound care was  reviewed with the patient (keep incision clean and dry for 3 days). Please call with questions.    Daphne Barrack, NP-C, AGACNP-BC [facility] - Electrophysiology  03/13/2024, 10:01 AM   Justin [name] was seen by me today along with Daphne Barrack. I have personally performed an evaluation on this patient.  My findings are as follows: 83 y.o. male with the above past medical history.  He presented to the hospital and was found to have a left pontine and left PCA small scattered infarct.  He had previously been admitted to the hospital with right sided infarcts.  He is feeling well today.  He has no acute complaints.  He feels that he is back to his baseline.  Plan is for discharge today.  Data: EKG(s) and pertinent labs, studies, etc were personally reviewed and interpreted by me:  EKG, telemetry Otherwise, I agree with data as outlined by the advanced practice provider.  Exam performed by me: Gen: No acute distress Neck: No JVD Cardiac: Regular Lungs: Normal work of breathing Extremities: No edema  My Assessment and Plan:  1.  Cryptogenic stroke: Patient has had continued symptoms and has had multiple strokes in the last few weeks.  He would benefit from further monitoring via ILR for atrial fibrillation.  Risks and benefits have been discussed.  Risk include bleeding and infection.  He understands these risks and is agreed to the procedure.  Signed,  Oryn Casanova Justin Norton, MD  03/13/2024 10:11 AM

## 2024-03-13 NOTE — TOC CAGE-AID Note (Signed)
 Transition of Care [facility]) - CAGE-AID Screening   Patient Details  Name: Justin [name] MRN: [id number] Date of Birth: 1941/03/01  [facility]) CM/SW Contact:    Clevester Helzer E Sadeel Fiddler, LCSW Phone Number: 03/13/2024, 9:04 AM   Clinical Narrative: No SA noted.   CAGE-AID Screening:    Have You Ever Felt You Ought to Cut Down on Your Drinking or Drug Use?: No Have People Annoyed You By Critizing Your Drinking Or Drug Use?: No Have You Felt Bad Or Guilty About Your Drinking Or Drug Use?: No Have You Ever Had a Drink or Used Drugs First Thing In The Morning to Steady Your Nerves or to Get Rid of a Hangover?: No CAGE-AID Score: 0  Substance Abuse Education Offered: No

## 2024-03-13 NOTE — Discharge Summary (Signed)
 [facility] Physician Discharge Summary   Patient name: Justin [name]  Admit date:     03/10/2024  Discharge date: 03/13/2024  Attending Physician: MELVIN, ALEXANDER B [[id number]]  Discharge Physician: Camellia Door   PCP: Caro Harlene POUR, NP  Admitted From: Home  Disposition:  Home  Recommendations for Outpatient Follow-up:  Follow up with PCP in 1-2 weeks Follow up with stroke clinic  Home Health:No Equipment/Devices: None  Discharge Condition:Stable CODE STATUS:FULL Diet recommendation: Heart Healthy Fluid Restriction: None  Hospital Summary: CC: Abnormal speech,R arm weakness  HPI: Justin [name] is a 83 y.o. male with medical history significant of hypertension, hyperlipidemia, diabetes, PAD, GERD, spinocerebellar ataxia type VI, abdominal aortic dissection, recent admission for CVA presenting with focal neurologic deficits.   Patient was recently admitted for CVA.  Workup concerning for possible embolic etiology with plan for longer-term monitoring.  Discharged on aspirin  and Plavix.   Patient has spinocerebellar ataxia type VI and has help at home.  Today his caregiver noted between 11 AM and 12 PM patient had onset of abnormal speech and R arm weakness.  He initially noticed some numbness in his left arm where his arm felt like he was asleep.  This improved and then he noted that he had some numbness in his right arm with weakness.  Patient reported associated headache.   Denies fevers, chills, chest pain, shortness of breath, abdominal pain, constipation, diarrhea, nausea, vomiting.   ED Course: Vital signs in the ED notable for blood pressure in the 130s-150s systolic.   Lab workup included CMP with bicarb 21, glucose 149, AST 43, ALT 455.  CBC with leukocytosis to 15.4.  PT, PTT, INR within normal limits.  Ethanol level pending.   CT head showed no acute abnormality.  MRI brain showed new acute infarct.   Neurology consulted and are  following.  Significant Events: Admitted 03/10/2024 for acute CVA   Admission Labs: WBC 15.4, HgB 14.9, plt 248 Na 139, K 4.2, CO2 of 21, BUN 17, Scr 1.17, glu 149 T. Prot 6.7, alb 3.8, alk phos 109, t. Bili 0.9  Admission Imaging Studies: CT head No acute intracranial abnormality.  ASPECTS 10/10 2. Stable subacute infarcts in the left parietal and occipital lobes without new or progressive infarcts. MRI brain New acute infarct in the left pons. 2. Unchanged late acute infarcts in the left occipital lobe. 3. No acute hemorrhage or significant mass effect.  Significant Labs:   Significant Imaging Studies: Bilateral LE U/S negative for DVT  Antibiotic Therapy: Anti-infectives (From admission, onward)    None       Procedures: 03-13-2024 loop recorder placement.  Consultants: Neurology cardiology   Hospital Course by Problem: * Acute CVA (cerebrovascular accident) (HCC) 03/11/24 defer to neurology for further workup as he already had a complete evaluation done just 3 days ago.  If neurology thinks that he needs a transesophageal echocardiogram,    we will be up to them to arrange this with cardiology.  He remains on aspirin  and Plavix.  Allow for permissive hypertension for now.  Until instructed by neurology that it is okay to achieve normotension.  03/12/24 Neurology wants him to get a loop recorder before discharge. Have sent message to cardiology. Discussed with pt's dtr debbie via phone. Ok for normotension now.   Non-insulin dependent type 2 diabetes mellitus (HCC) 03/11/24 on SSI.  03/12/24 stable.   PAD (peripheral artery disease) 03/11/24 on ASA and lipitor.  03/12/24 stable.   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 Followed by Dr. Evonnie with neurology.  03/12/24 chronic. Stable.   Pure hypercholesterolemia 03/11/24 on lipitor.  03/12/24 on lipitor.   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 Followed by Dr. Evonnie with  neurology.  03/12/24 chronic. Followed by outpatient neurology.   GERD (gastroesophageal reflux disease) 03/11/24 continue protonix  40 mg daily.  03/12/24 continue with protonix .  Essential hypertension 03/11/24 allow for permissive HTN. Keep BP <220/110 until cleared by neurology for normotension.  03/12/24 ok for normotension now. Will restart home HTN meds. Restart norvasc , losartan  and propranolol . Stop hydrochlorothiazide . Pt has difficulty with mobility given his spinal ataxia. Better to not use diuretic to keep him from having to rush to get to bathroom.     Discharge Diagnoses:  Principal Problem:   Acute CVA (cerebrovascular accident) [facility]) Active Problems:   Essential hypertension   GERD (gastroesophageal reflux disease)   Neurodegenerative gait disorder   Pure hypercholesterolemia   Spinocerebellar ataxia type 6 (HCC)   PAD (peripheral artery disease)   Non-insulin dependent type 2 diabetes mellitus [facility])   Discharge Instructions  Discharge Instructions     Ambulatory referral to Neurology   Complete by: As directed    An appointment is requested in approximately: 2 weeks. Stroke clinic.   Call MD for:  difficulty breathing, headache or visual disturbances   Complete by: As directed    Call MD for:  extreme fatigue   Complete by: As directed    Call MD for:  hives   Complete by: As directed    Call MD for:  persistant dizziness or light-headedness   Complete by: As directed    Call MD for:  persistant nausea and vomiting   Complete by: As directed    Call MD for:  redness, tenderness, or signs of infection (pain, swelling, redness, odor or green/yellow discharge around incision site)   Complete by: As directed    Call MD for:  severe uncontrolled pain   Complete by: As directed    Call MD for:  temperature >100.4   Complete by: As directed    Diet - low sodium heart healthy   Complete by: As directed    Discharge instructions   Complete by: As  directed    1. Follow up with your primary care provider in 1-2 weeks following discharge from hospital. 2. Ambulatory referral made to neurology/stroke clinic. Office will call you with appointment.   Increase activity slowly   Complete by: As directed       Allergies as of 03/13/2024       Reactions   Effexor [venlafaxine Hcl]    unknown   Serzone [nefazodone]    unknown   Vivactil [protriptyline Hcl]    unknown        Medication List     STOP taking these medications    ibuprofen 200 MG tablet Commonly known as: ADVIL       TAKE these medications    acetaminophen  650 MG CR tablet Commonly known as: Tylenol  8 Hour Arthritis Pain Take 1 tablet (650 mg total) by mouth 2 (two) times daily.   ALLERGY RELIEF PO Take 1 tablet by mouth daily with lunch.   amLODipine  5 MG tablet Commonly known as: NORVASC  TAKE 1 TABLET BY MOUTH EVERY DAY What changed: when to take this   aspirin  EC 81 MG tablet Take 1 tablet (81 mg total) by mouth daily for 21 days. Swallow whole. What changed: when to take this   atorvastatin   10 MG tablet Commonly known as: LIPITOR TAKE 1 TABLET BY MOUTH EVERY DAY What changed: when to take this   BIOFLEX PO Take 1 tablet by mouth daily with lunch.   clopidogrel 75 MG tablet Commonly known as: Plavix Take 1 tablet (75 mg total) by mouth daily. What changed: when to take this   hydrochlorothiazide  12.5 MG tablet Commonly known as: HYDRODIURIL  TAKE 1 TABLET BY MOUTH EVERY DAY What changed: when to take this   losartan  100 MG tablet Commonly known as: COZAAR  Take 1 tablet (100 mg total) by mouth daily. What changed: when to take this   multivitamin tablet Take 1 tablet by mouth daily with lunch.   Myrbetriq  25 MG Tb24 tablet Generic drug: mirabegron  ER TAKE 1 TABLET (25 MG TOTAL) BY MOUTH DAILY. What changed: when to take this   pantoprazole  40 MG tablet Commonly known as: PROTONIX  TAKE 1 TABLET BY MOUTH EVERY DAY What  changed: when to take this   potassium chloride  10 MEQ tablet Commonly known as: KLOR-CON  TAKE 1 TABLET BY MOUTH EVERY DAY What changed: when to take this   pregabalin  25 MG capsule Commonly known as: LYRICA  TAKE 1 CAPSULE BY MOUTH THREE TIMES A DAY What changed: See the new instructions.   PROBIOTIC DAILY PO Take 1 tablet by mouth daily with lunch.   propranolol  40 MG tablet Commonly known as: INDERAL  TAKE 1 TABLET BY MOUTH TWICE A DAY   VITAMIN D3 PO Take 1 tablet by mouth daily with lunch.        Allergies  Allergen Reactions   Effexor [Venlafaxine Hcl]     unknown   Serzone [Nefazodone]     unknown   Vivactil [Protriptyline Hcl]     unknown    Discharge Exam: Vitals:   03/13/24 1134 03/13/24 1314  BP: (!) 148/77 (!) 145/85  Pulse: 68 67  Resp: 19 19  Temp: 98.5 F (36.9 C)   SpO2: 93% 98%    Physical Exam Vitals and nursing note reviewed.  Constitutional:      General: He is not in acute distress.    Appearance: He is not toxic-appearing.  HENT:     Head: Normocephalic and atraumatic.  Cardiovascular:     Rate and Rhythm: Normal rate and regular rhythm.  Pulmonary:     Effort: Pulmonary effort is normal. No respiratory distress.     Breath sounds: Normal breath sounds.  Abdominal:     General: Bowel sounds are normal. There is no distension.     Palpations: Abdomen is soft.  Skin:    General: Skin is warm and dry.  Neurological:     Mental Status: He is alert.     Comments: Similar dysarthria Mild intention tremor.    The results of significant diagnostics from this hospitalization (including imaging, microbiology, ancillary and laboratory) are listed below for reference.     Labs: Basic Metabolic Panel: Recent Labs  Lab 03/07/24 1035 03/08/24 0230 03/10/24 1349 03/10/24 1353 03/11/24 0530  NA 138 136 139 141 139  K 4.4 3.6 4.2 4.2 3.6  CL 102 101 106 105 105  CO2 24 23 21*  --  20*  GLUCOSE 221* 154* 149* 148* 130*  BUN 18 14  17 19 15   CREATININE 1.16 1.07 1.17 1.20 0.92  CALCIUM  10.0 9.1 9.7  --  9.0   Liver Function Tests: Recent Labs  Lab 03/07/24 1035 03/08/24 0230 03/10/24 1349 03/11/24 0530  AST 27 23 43* 32  ALT 44 35 55* 50*  ALKPHOS 137* 108 109 95  BILITOT 0.5 1.1 0.9 1.1  PROT 7.1 6.2* 6.7 5.8*  ALBUMIN 4.4 3.4* 3.8 3.2*   CBC: Recent Labs  Lab 03/07/24 1035 03/08/24 0230 03/10/24 1349 03/10/24 1353 03/11/24 0530  WBC 11.6* 15.3* 15.4*  --  11.4*  NEUTROABS 6.1  --  7.8*  --   --   HGB 15.1 14.5 14.9 14.6 14.1  HCT 44.9 43.2 44.4 43.0 43.0  MCV 90.5 89.3 90.4  --  92.3  PLT 244 225 248  --  186   CBG: Recent Labs  Lab 03/12/24 1601 03/12/24 2048 03/12/24 2249 03/13/24 0750 03/13/24 1133  GLUCAP 182* 182* 165* 136* 136*   Sepsis Labs Recent Labs  Lab 03/07/24 1035 03/08/24 0230 03/10/24 1349 03/11/24 0530  WBC 11.6* 15.3* 15.4* 11.4*   Procedures/Studies: EP PPM/ICD IMPLANT Result Date: 03/13/2024 SURGEON:  Will Gladis Norton, MD   PREPROCEDURE DIAGNOSIS:  Cryptogenic stroke   POSTPROCEDURE DIAGNOSIS: Cryptogenic stroke    PROCEDURES:  1. Implantable loop recorder implantation   INTRODUCTION:  BRAELON SPRUNG presents with a history of cryptogenic stroke The costs of loop recorder monitoring have been discussed with the patient. Appropriate time out was performed prior to the procedure.   DESCRIPTION OF PROCEDURE:  Informed written consent was obtained.  The patient required no sedation for the procedure today.  Mapping over the patient's chest was performed to identify the area where electrograms were most prominent for ILR recording.  This area was found to be the left parasternal region over the 4th intercostal space. The patients left chest was therefore prepped and draped in the usual sterile fashion. The skin overlying the left parasternal region was infiltrated with lidocaine for local analgesia.  A 0.5-cm incision was made over the left parasternal region over the  3rd intercostal space.  A subcutaneous ILR pocket was fashioned using a combination of sharp and blunt dissection.  A Medtronic Reveal LINQ 2 (serial # [id number]) implantable loop recorder was then placed into the pocket  R waves were very prominent and measured 0.1mV.  Steri- Strips and a sterile dressing were then applied.  There were no early apparent complications.   CONCLUSIONS:  1. Successful implantation of a implantable loop recorder for a history of cryptogenic stroke  2. No early apparent complications. Will Gladis Norton, MD 03/13/2024 12:34 PM   VAS US  LOWER EXTREMITY VENOUS (DVT) Result Date: 03/11/2024  Lower Venous DVT Study Patient Name:  APRIL CARLYON  Date of Exam:   03/11/2024 Medical Rec #: [id number]        Accession #:    [id number] Date of Birth: [birth year]        Patient Gender: M Patient Age:   47 years Exam Location:  [facility] Procedure:      VAS US  LOWER EXTREMITY VENOUS (DVT) Referring Phys: ARY XU --------------------------------------------------------------------------------  Indications: Stroke.  Risk Factors: None identified. Limitations: Patient positioning. Comparison Study: No prior studies. Performing Technologist: Cordella Collet RVT  Examination Guidelines: A complete evaluation includes B-mode imaging, spectral Doppler, color Doppler, and power Doppler as needed of all accessible portions of each vessel. Bilateral testing is considered an integral part of a complete examination. Limited examinations for reoccurring indications may be performed as noted. The reflux portion of the exam is performed with the patient in reverse Trendelenburg.  +---------+---------------+---------+-----------+----------+--------------+ RIGHT    CompressibilityPhasicitySpontaneityPropertiesThrombus Aging +---------+---------------+---------+-----------+----------+--------------+ CFV      Full  Yes      Yes                                  +---------+---------------+---------+-----------+----------+--------------+ SFJ      Full                                                        +---------+---------------+---------+-----------+----------+--------------+ FV Prox  Full                                                        +---------+---------------+---------+-----------+----------+--------------+ FV Mid   Full                                                        +---------+---------------+---------+-----------+----------+--------------+ FV DistalFull                                                        +---------+---------------+---------+-----------+----------+--------------+ PFV      Full                                                        +---------+---------------+---------+-----------+----------+--------------+ POP      Full           Yes      Yes                                 +---------+---------------+---------+-----------+----------+--------------+ PTV      Full                                                        +---------+---------------+---------+-----------+----------+--------------+ PERO     Full                                                        +---------+---------------+---------+-----------+----------+--------------+   +---------+---------------+---------+-----------+----------+--------------+ LEFT     CompressibilityPhasicitySpontaneityPropertiesThrombus Aging +---------+---------------+---------+-----------+----------+--------------+ CFV      Full           Yes      Yes                                 +---------+---------------+---------+-----------+----------+--------------+ SFJ  Full                                                        +---------+---------------+---------+-----------+----------+--------------+ FV Prox  Full                                                         +---------+---------------+---------+-----------+----------+--------------+ FV Mid   Full                                                        +---------+---------------+---------+-----------+----------+--------------+ FV DistalFull                                                        +---------+---------------+---------+-----------+----------+--------------+ PFV      Full                                                        +---------+---------------+---------+-----------+----------+--------------+ POP      Full           Yes      Yes                                 +---------+---------------+---------+-----------+----------+--------------+ PTV      Full                                                        +---------+---------------+---------+-----------+----------+--------------+ PERO     Full                                                        +---------+---------------+---------+-----------+----------+--------------+     Summary: RIGHT: - There is no evidence of deep vein thrombosis in the lower extremity.  - No cystic structure found in the popliteal fossa.  LEFT: - There is no evidence of deep vein thrombosis in the lower extremity.  - No cystic structure found in the popliteal fossa.  *See table(s) above for measurements and observations. Electronically signed by Gaile New MD on 03/11/2024 at 10:12:17 PM.    Final    MR BRAIN WO CONTRAST Result Date: 03/10/2024 EXAM: MR Brain without Intravenous Contrast. CLINICAL HISTORY: Stroke, follow up. TECHNIQUE: Magnetic resonance images of the brain without intravenous contrast in multiple planes. CONTRAST: Without. COMPARISON: Head CT 03/10/2024, MRI  brain 03/08/2024. FINDINGS: BRAIN: New acute infarct in the left pons. Unchanged late acute infarcts in the left occipital lobe. No acute hemorrhage or significant mass effect. Unchanged atrophy of the superior cerebellum and vermis. No midline shift or  extra-axial fluid collection. No cerebellar tonsillar ectopia. The central arterial and venous flow voids are patent. VENTRICLES: No hydrocephalus. ORBITS: The orbits are normal. SINUSES AND MASTOIDS: The sinuses and mastoid air cells are clear. BONES: No acute fracture or focal osseous lesion. IMPRESSION: 1. 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 2. Unchanged late acute infarcts in the left occipital lobe. 3. No acute hemorrhage or significant mass effect. Electronically signed by: Ryan Chess MD 03/10/2024 04:19 PM EDT RP Workstation: [id number]   CT HEAD CODE STROKE WO CONTRAST Result Date: 03/10/2024 EXAM: CT HEAD WITHOUT CONTRAST 03/10/2024 01:56:47 PM TECHNIQUE: CT of the head was performed without the administration of intravenous contrast. Automated exposure control, iterative reconstruction, and/or weight based adjustment of the mA/kV was utilized to reduce the radiation dose to as low as reasonably achievable. COMPARISON: MR head without contrast 03/08/2024. CLINICAL HISTORY: Last known normal at 11 am today. Recent discharge for acute stroke. Progressive right upper extremity weakness and dysarthria. FINDINGS: BRAIN AND VENTRICLES: No acute hemorrhage. Areas of subacute infarction are again noted in the left parietal and occipital lobe. No new or progressive infarcts are present. Mild atrophy and white matter changes are stable. Atherosclerotic calcifications are present in the cavernous carotid arteries bilaterally. No hyperdense vessel is present. No hydrocephalus. No extra-axial collection. No mass effect or midline shift. ORBITS: Bilateral lens replacements are noted. The globes and orbits are otherwise within normal limits. SINUSES: No acute abnormality. SOFT TISSUES AND SKULL: No acute soft tissue abnormality. No skull fracture. Stat 210 pm [place] Stroke Program Early CT (ASPECT) Score ----- Ganglionic (caudate, IC, lentiform nucleus, insula, M1-M3):7 Supraganglionic (M4-M6): 3 Total: 10  IMPRESSION: 1. No acute intracranial abnormality.  ASPECTS 10/10 2. Stable subacute infarcts in the left parietal and occipital lobes without new or progressive infarcts. The pertinent results were texted to Dr. Matthews via the [facility] system at 2:09pm. Electronically signed by: Lonni Necessary MD 03/10/2024 02:11 PM EDT RP Workstation: [id number]   ECHOCARDIOGRAM COMPLETE Result Date: 03/08/2024    ECHOCARDIOGRAM REPORT   Patient Name:   JUANDAVID DALLMAN Date of Exam: 03/08/2024 Medical Rec #:  [id number]       Height:       70.0 in Accession #:    [id number]      Weight:       181.0 lb Date of Birth:  12-23-1940       BSA:          2.001 m Patient Age:    82 years        BP:           150/77 mmHg Patient Gender: M               HR:           60 bpm. Exam Location:  Inpatient Procedure: 2D Echo, Cardiac Doppler, Color Doppler and Saline Contrast Bubble            Study (Both Spectral and Color Flow Doppler were utilized during            procedure). Indications:    TIA  History:        Patient has prior history of Echocardiogram examinations, most  recent 04/19/2020. Signs/Symptoms:Altered Mental Status; Risk                 Factors:Hypertension and Dyslipidemia. Abdominal aortic                 dissection.  Sonographer:    Ellouise Mose RDCS Referring Phys: [id number] TIMOTHY S OPYD  Sonographer Comments: Technically difficult study due to poor echo windows and no subcostal window. IMPRESSIONS  1. Left ventricular ejection fraction, by estimation, is 70 to 75%. The left ventricle has hyperdynamic function. The left ventricle has no regional wall motion abnormalities. There is mild left ventricular hypertrophy. Left ventricular diastolic parameters are consistent with Grade I diastolic dysfunction (impaired relaxation).  2. Right ventricular systolic function is normal. The right ventricular size is normal. Tricuspid regurgitation signal is inadequate for assessing PA pressure.  3. The mitral valve is  normal in structure. No evidence of mitral valve regurgitation. No evidence of mitral stenosis.  4. The aortic valve is tricuspid. Aortic valve regurgitation is not visualized. No aortic stenosis is present.  5. Agitated saline contrast bubble study was negative, with no evidence of any interatrial shunt. Comparison(s): A prior study was performed on 04/19/2020. No significant change from prior study. FINDINGS  Left Ventricle: Left ventricular ejection fraction, by estimation, is 70 to 75%. The left ventricle has hyperdynamic function. The left ventricle has no regional wall motion abnormalities. The left ventricular internal cavity size was normal in size. There is mild left ventricular hypertrophy. Left ventricular diastolic parameters are consistent with Grade I diastolic dysfunction (impaired relaxation). Right Ventricle: The right ventricular size is normal. No increase in right ventricular wall thickness. Right ventricular systolic function is normal. Tricuspid regurgitation signal is inadequate for assessing PA pressure. Left Atrium: Left atrial size was normal in size. Right Atrium: Right atrial size was normal in size. Pericardium: Trivial pericardial effusion is present. Mitral Valve: The mitral valve is normal in structure. No evidence of mitral valve regurgitation. No evidence of mitral valve stenosis. Tricuspid Valve: The tricuspid valve is normal in structure. Tricuspid valve regurgitation is not demonstrated. No evidence of tricuspid stenosis. Aortic Valve: The aortic valve is tricuspid. Aortic valve regurgitation is not visualized. No aortic stenosis is present. Pulmonic Valve: The pulmonic valve was normal in structure. Pulmonic valve regurgitation is not visualized. No evidence of pulmonic stenosis. Aorta: The aortic root and ascending aorta are structurally normal, with no evidence of dilitation. Venous: The inferior vena cava was not well visualized. IAS/Shunts: No atrial level shunt detected by  color flow Doppler. Agitated saline contrast was given intravenously to evaluate for intracardiac shunting. Agitated saline contrast bubble study was negative, with no evidence of any interatrial shunt.  LEFT VENTRICLE PLAX 2D LVIDd:         4.35 cm     Diastology LVIDs:         2.15 cm     LV e' medial:    4.79 cm/s LV PW:         0.93 cm     LV E/e' medial:  15.5 LV IVS:        1.00 cm     LV e' lateral:   6.20 cm/s LVOT diam:     2.00 cm     LV E/e' lateral: 12.0 LV SV:         95 LV SV Index:   47 LVOT Area:     3.14 cm LV IVRT:       70  msec  LV Volumes (MOD) LV vol d, MOD A2C: 55.5 ml LV vol d, MOD A4C: 44.0 ml LV vol s, MOD A2C: 15.7 ml LV vol s, MOD A4C: 13.8 ml LV SV MOD A2C:     39.8 ml LV SV MOD A4C:     44.0 ml LV SV MOD BP:      35.0 ml RIGHT VENTRICLE RV S prime:     17.40 cm/s TAPSE (M-mode): 2.9 cm LEFT ATRIUM             Index LA diam:        3.59 cm 1.79 cm/m LA Vol (A2C):   27.6 ml 13.80 ml/m LA Vol (A4C):   24.5 ml 12.25 ml/m LA Biplane Vol: 27.4 ml 13.70 ml/m  AORTIC VALVE LVOT Vmax:   147.00 cm/s LVOT Vmean:  98.500 cm/s LVOT VTI:    0.301 m  AORTA Ao Root diam: 3.01 cm Ao Asc diam:  3.52 cm MITRAL VALVE MV Area (PHT): 3.27 cm    SHUNTS MV Decel Time: 232 msec    Systemic VTI:  0.30 m MV E velocity: 74.40 cm/s  Systemic Diam: 2.00 cm MV A velocity: 83.75 cm/s MV E/A ratio:  0.89 Emeline Calender Electronically signed by Emeline Calender Signature Date/Time: 03/08/2024/5:11:17 PM    Final    MR BRAIN WO CONTRAST Result Date: 03/08/2024 EXAM: MR Brain without Intravenous Contrast. CLINICAL HISTORY: 83 year old male with acute neuro deficit, stroke suspected, and age-indeterminate left PCA cortical infarct by CT. TECHNIQUE: Magnetic resonance images of the brain without intravenous contrast in multiple planes. CONTRAST: Without. COMPARISON: Head CT and CTA head and neck yesterday. Previous brain MRI 09/03/2006. FINDINGS: BRAIN: Confirmed small areas of abnormality diffusion in the lateral left  occipital lobe cortex (series 5 image 70, corresponding to CT finding), also subcortical white matter of the left lateral occipital lobe on series 5 image 74. These appear mildly restricted associated T2 and FLAIR hyperintense cytotoxic edema at those areas with no hemorrhagic transformation or mass effect. No other diffusion restriction. The central arterial and venous flow voids are patent. Major vascular flow voids appear stable from the 2008 MRI. Generalized cerebral hemisphere volume loss since 2008 appears to be age appropriate. However, cerebellar atrophy is advanced and disproportionate (sagittal image 13). No superimposed chronic cerebral blood products. There is chronic encephalomalacia along both posterior insular cortex (series 11 image 29) new from 2008. Elsewhere only mild for age T2 heterogeneity in the basal ganglia. No midline shift or extra-axial fluid collection. No intracranial mass or hemorrhage. VENTRICLES: No hydrocephalus. ORBITS: Postoperative changes to both globes since 2008. SINUSES AND MASTOIDS: The sinuses and mastoid air cells are clear. 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 No acute fracture or focal osseous lesion. IMPRESSION: 1. Confirmed small subacute left PCA territory infarcts in the occipital lobe with No hemorrhagic transformation or mass effect. 2. Advanced cerebellar atrophy since 2008, nonspecific. 3. Otherwise mild chronic encephalomalacia along the bilateral posterior insular cortex is also new from 2008. Electronically signed by: Helayne Hurst MD 03/08/2024 06:18 AM EDT RP Workstation: [id number]   CT ANGIO HEAD NECK W WO CM (CODE STROKE) Result Date: 03/07/2024 CLINICAL DATA:  Left-sided weakness EXAM: CT ANGIOGRAPHY HEAD AND NECK WITH AND WITHOUT CONTRAST TECHNIQUE: Multidetector CT imaging of the head and neck was performed using the standard protocol during bolus administration of intravenous contrast.  Multiplanar CT image reconstructions and MIPs were obtained to evaluate the vascular anatomy. Carotid stenosis measurements (  when applicable) are obtained utilizing NASCET criteria, using the distal internal carotid diameter as the denominator. RADIATION DOSE REDUCTION: This exam was performed according to the departmental dose-optimization program which includes automated exposure control, adjustment of the mA and/or kV according to patient size and/or use of iterative reconstruction technique. CONTRAST:  75mL OMNIPAQUE IOHEXOL 350 MG/ML SOLN COMPARISON:  None Available. CTA NECK: CTA NECK Aortic arch: No proximal vessel stenosis. Right carotid: There is a small amount of plaque at the bifurcation without significant stenosis Left carotid: Small amount of plaque at the bifurcation without significant stenosis Right vertebral: Normal Left vertebral: Normal Soft tissues: No significant abnormality Other comments: None CTA HEAD: CTA HEAD Right anterior circulation: The internal carotid artery is patent without significant stenosis. The anterior and middle cerebral arteries are patent without significant stenosis or proximal branch occlusion. No aneurysm. Left anterior circulation: The internal carotid artery is patent without significant stenosis. The anterior and middle cerebral arteries are patent without significant stenosis or proximal branch occlusion. No aneurysm. Posterior circulation: Both vertebral arteries are patent. 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 Both posterior cerebral arteries are patent. IMPRESSION: 1. No extracranial carotid artery stenosis 2. Severe mid basilar artery stenosis 3. No proximal vessel occlusion in the anterior circulation Electronically Signed   By: Nancyann Burns M.D.   On: 03/07/2024 11:52   CT HEAD CODE STROKE WO CONTRAST Result Date: 03/07/2024 CLINICAL DATA:  Code stroke. Provided history: Neuro deficit, acute, stroke suspected. Slurred speech. Left-sided weakness. EXAM:  CT HEAD WITHOUT CONTRAST TECHNIQUE: Contiguous axial images were obtained from the base of the skull through the vertex without intravenous contrast. RADIATION DOSE REDUCTION: This exam was performed according to the departmental dose-optimization program which includes automated exposure control, adjustment of the mA and/or kV according to patient size and/or use of iterative reconstruction technique. COMPARISON:  Brain MRI 09/03/2006. FINDINGS: Brain: Generalized cerebral and cerebellar atrophy. Small age-indeterminate cortical infarct within the left occipital lobe (PCA vascular territory) (series 5, image 14). Patchy and ill-defined hypoattenuation within the cerebral white matter, nonspecific but compatible with mild chronic small vessel ischemic disease. Small chronic appearing lacunar infarct within the right lentiform nucleus. There is no acute intracranial hemorrhage. No extra-axial fluid collection. No evidence of an intracranial mass. No midline shift. Vascular: No hyperdense vessel.  Atherosclerotic calcifications. Skull: No calvarial fracture or aggressive osseous lesion. Sinuses/Orbits: No mass or acute finding within the imaged orbits. No significant paranasal sinus disease at the imaged levels. ASPECTS (Alberta Stroke Program Early CT Score) - Ganglionic level infarction (caudate, lentiform nuclei, internal capsule, insula, M1-M3 cortex): 7 - Supraganglionic infarction (M4-M6 cortex): 3 Total score (0-10 with 10 being normal): 10 (when discounting a chronic right basal ganglia lacunar infarct). These results were called by telephone at the time of interpretation on 03/07/2024 at 10:38 am to provider JOSHUA LONG , who verbally acknowledged these results. IMPRESSION: 1. Small age-indeterminate cortical infarct within the left occipital lobe (PCA vascular territory). A brain MRI may be obtained for further evaluation, as clinically warranted. 2. Small chronic lacunar infarct within the right basal  ganglia. 3. Background parenchymal atrophy and mild cerebral white matter chronic small vessel ischemic disease. Electronically Signed   By: Rockey Childs D.O.   On: 03/07/2024 10:41   SURGEON:  Will Gladis Norton, MD     PREPROCEDURE DIAGNOSIS:  Cryptogenic stroke    POSTPROCEDURE DIAGNOSIS: Cryptogenic stroke     PROCEDURES:   1. Implantable loop recorder implantation    INTRODUCTION:  SEPHIROTH MCLUCKIE presents with a history of cryptogenic stroke The costs of loop recorder monitoring have been discussed with the patient. Appropriate time out was performed prior to the procedure.    DESCRIPTION OF PROCEDURE:  Informed written consent was obtained.  The patient required no sedation for the procedure today.  Mapping over the patient's chest was performed to identify the area where electrograms were most prominent for ILR recording.  This area was found to be the left parasternal region over the 4th intercostal space. The patients left chest was therefore prepped and draped in the usual sterile fashion. The skin overlying the left parasternal region was infiltrated with lidocaine for local analgesia.  A 0.5-cm incision was made over the left parasternal region over the 3rd intercostal space.  A subcutaneous ILR pocket was fashioned using a combination of sharp and blunt dissection.  A Med[employer]onic Reveal LINQ 2 (serial # [id number]) implantable loop recorder was then placed into the pocket  R waves were very prominent and measured 0.56mV.  Steri- Strips and a sterile dressing were then applied.  There were no early apparent complications.     CONCLUSIONS:   1. Successful implantation of a implantable loop recorder for a history of cryptogenic stroke  2. No early apparent complications.    Will Gladis Norton, MD  03/13/2024 12:34 PM  Time coordinating discharge: 60 mins  SIGNED:  Camellia Door, DO [facility] 03/13/24, 1:15 PM

## 2024-03-13 NOTE — Discharge Instructions (Addendum)

## 2024-03-14 ENCOUNTER — Encounter (HOSPITAL_COMMUNITY): Payer: Self-pay | Admitting: Cardiology

## 2024-03-16 ENCOUNTER — Telehealth: Payer: Self-pay | Admitting: Cardiovascular Disease

## 2024-03-16 ENCOUNTER — Encounter: Payer: Self-pay | Admitting: Neurology

## 2024-03-16 NOTE — Telephone Encounter (Signed)
 Call to patient's caregiver Bascom [facility]) to address questions about monitors. Bascom states patient got a loop recorder in the hospital, but now has been delivered a [employer]ips home heart monitor. Caregiver is asking if both need to be done because she thinks the Philips monitor may also have been ordered at the hospital. Patient has 30 day monitor f/u with Katlyn West, NP scheduled for December 2025. Forwarded to Dr. Nishan for recommendations.

## 2024-03-16 NOTE — Telephone Encounter (Signed)
 Pts caregiver calling to ask if he should still wear the heart monitor received in the mail, even though he has an implant. Please advise.

## 2024-03-20 ENCOUNTER — Ambulatory Visit: Admitting: Rehabilitative and Restorative Service Providers"

## 2024-03-20 NOTE — Therapy (Deleted)
 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   Diaphragmatic hernia without mention of obstruction or gangrene 1982   Hiatal hernia   Diplopia 2006   Disturbance of skin sensation 2003   Diverticulosis of colon (without mention of hemorrhage) 1997   Dizziness and giddiness 2001   Dysphagia, unspecified(787.20) 1999 and   Esophagitis, unspecified 1997   GERD (gastroesophageal reflux disease) 2003   Hypertension 2003   Impotence of organic origin 1999   Lack of coordination 09/07/2007   Lumbago 2003   Migraine with aura, without mention of intractable migraine without mention of status migrainosus 1997   Myalgia and myositis, unspecified 1999   Other and unspecified hyperlipidemia 2003   Other disorders of vitreous 2003   Other malaise and fatigue 2003   Other seborrheic keratosis 2013   Other seborrheic keratosis 04/13/2009   Pain in joint, site unspecified 04/13/2012   Personal history of fall 04/15/2011   Restless legs syndrome (RLS) 1997   Spinocerebellar disease, unspecified 05/01/2008   Unspecified hearing loss 2003   Unspecified tinnitus 04/15/2011   Past Surgical History:  Procedure Laterality Date   APPENDECTOMY  Age 67   boil removed     Dr. Ebbie   C3-4 anterior cervical surgery  1999   Dr. Carles   CARDIAC CATHETERIZATION  2001   Dr. Bulah   COLONOSCOPY  07/20/07   Dr.  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   Urinary frequency 10/06/2018   Pure hypercholesterolemia 10/06/2018   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   Urgency incontinence 04/01/2016   History of fall 08/21/2015   Fungal dermatosis 02/20/2015   SCC (squamous cell carcinoma), 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   Internal hemorrhoids without complication 08/06/2014   Arthritis 02/21/2014   Seborrheic keratosis 02/21/2014   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  Rationale for Evaluation and Treatment: Rehabilitation  SUBJECTIVE:  SUBJECTIVE STATEMENT: Recent L pons infarct on MRI on 03/10/24 *** Pt accompanied by: caregiver   PERTINENT HISTORY: HTN, hyperlipidemia, spinocerebellar  disease (ataxia type VI), diabetes, PAD,   PAIN:  Are you having pain? {OPRCPAIN:27236}  PRECAUTIONS: {Therapy precautions:24002}  RED FLAGS: {PT Red Flags:29287}   WEIGHT BEARING RESTRICTIONS: {Yes ***/No:24003}  FALLS: Has patient fallen in last 6 months? {fallsyesno:27318}  LIVING ENVIRONMENT: Lives with: {OPRC lives with:25569::lives with their family} Lives in: {Lives in:25570} Stairs: {opstairs:27293} Has following equipment at home: {Assistive devices:23999}  PLOF: {PLOF:24004}  PATIENT GOALS: ***  OBJECTIVE:  Note: Objective measures were completed at Evaluation unless otherwise noted.  DIAGNOSTIC FINDINGS: ***  COGNITION: Overall cognitive status: {cognition:24006}   SENSATION: {sensation:27233}  COORDINATION: ***  EDEMA:  {edema:24020}  MUSCLE TONE: {LE tone:25568}  MUSCLE LENGTH: Hamstrings: Right *** deg; Left *** deg Debby test: Right *** deg; Left *** deg  DTRs:  {DTR SITE:24025}  POSTURE: {posture:25561}  LOWER EXTREMITY ROM:     {AROM/PROM:27142}  Right Eval Left Eval  Hip flexion    Hip extension    Hip abduction    Hip adduction    Hip internal rotation    Hip external rotation    Knee flexion    Knee extension    Ankle dorsiflexion    Ankle plantarflexion    Ankle inversion    Ankle eversion     (Blank rows = not tested)  LOWER EXTREMITY MMT:    MMT Right Eval Left Eval  Hip flexion    Hip extension    Hip abduction    Hip adduction    Hip internal rotation    Hip external rotation    Knee flexion    Knee extension    Ankle dorsiflexion    Ankle plantarflexion    Ankle inversion    Ankle eversion    (Blank rows = not tested)  BED MOBILITY:  {bed mobility:32615:p}  TRANSFERS: {transfers eval:32620}  RAMP:  {ramp eval:32616}  CURB:  {curb eval:32617}  STAIRS: {stairs eval:32618} GAIT: Findings: {GaitneuroPT:32644::Distance walked: ***,Comments: ***}  FUNCTIONAL TESTS:  {Functional  tests:24029}  PATIENT SURVEYS:  {rehab surveys:24030}                                                                                                                              TREATMENT DATE: ***    PATIENT EDUCATION: Education details: *** Person educated: {Person educated:25204} Education method: {Education Method:25205} Education comprehension: {Education Comprehension:25206}  HOME EXERCISE PROGRAM: ***  GOALS: Goals reviewed with patient? Yes  SHORT TERM GOALS: Target date: ***  The patient will be indep with initial HEP Baseline: initiated at eval Goal status: INITIAL  2.  ***  Baseline:  Goal status: INITIAL  3.  *** Baseline:  Goal status: INITIAL  4.  *** Baseline:  Goal status: INITIAL   LONG TERM GOALS: Target date: ***  The patient will be indep with progression of HEP. Baseline:  initiated at  eval Goal status: INITIAL  2.  The patient will improve ***  to demonstrate improved functional abilities. Baseline: *** Goal status: INITIAL  3.  *** Baseline:  Goal status: INITIAL  4.  *** Baseline:  Goal status: INITIAL  5.  *** Baseline:  Goal status: INITIAL  6.  *** Baseline:  Goal status: INITIAL  ASSESSMENT:  CLINICAL IMPRESSION: Patient is a 83 y.o. male who was seen today for physical therapy evaluation and treatment s/p recent CVA. 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 He presents today with ***   OBJECTIVE IMPAIRMENTS: {opptimpairments:25111}.   ACTIVITY LIMITATIONS: {activitylimitations:27494}  PARTICIPATION LIMITATIONS: {participationrestrictions:25113}  PERSONAL FACTORS: {Personal factors:25162} are also affecting patient's functional outcome.   REHAB POTENTIAL: {rehabpotential:25112}  CLINICAL DECISION MAKING: {clinical decision making:25114}  EVALUATION COMPLEXITY: {Evaluation complexity:25115}  PLAN:  PT FREQUENCY: {rehab frequency:25116}  PT DURATION: {rehab  duration:25117}  PLANNED INTERVENTIONS: {rehab planned interventions:25118::97110-Therapeutic exercises,97530- Therapeutic [phone]- Neuromuscular re-education,97535- Self Rjmz,02859- Manual therapy,Patient/Family education}  PLAN FOR NEXT SESSION: ***   Costas Sena, PT 03/20/2024, 7:22 AM

## 2024-03-24 ENCOUNTER — Telehealth: Payer: Self-pay

## 2024-03-24 NOTE — Telephone Encounter (Signed)
 Spoke with Chelsea from [facility] to give okay for Home Healthcare order and that the patient has been notified. Patient has no questions or concerns at this time.

## 2024-03-24 NOTE — Telephone Encounter (Signed)
 Copied from CRM [id number]. Topic: Clinical - Medical Advice >> Mar 24, 2024 12:22 PM Merlynn A wrote: Reason for CRM: Patient has requested nursing start of care on Saturday November 1st with [facility]. Any questions contact [phone].

## 2024-03-24 NOTE — Telephone Encounter (Signed)
 Noted, to keep follow up appt in office

## 2024-03-31 ENCOUNTER — Ambulatory Visit: Admitting: Rehabilitative and Restorative Service Providers"

## 2024-04-01 DIAGNOSIS — M5134 Other intervertebral disc degeneration, thoracic region: Secondary | ICD-10-CM | POA: Diagnosis not present

## 2024-04-01 DIAGNOSIS — K209 Esophagitis, unspecified without bleeding: Secondary | ICD-10-CM | POA: Diagnosis not present

## 2024-04-01 DIAGNOSIS — E7849 Other hyperlipidemia: Secondary | ICD-10-CM | POA: Diagnosis not present

## 2024-04-01 DIAGNOSIS — I7102 Dissection of abdominal aorta: Secondary | ICD-10-CM | POA: Diagnosis not present

## 2024-04-01 DIAGNOSIS — M791 Myalgia, unspecified site: Secondary | ICD-10-CM | POA: Diagnosis not present

## 2024-04-01 DIAGNOSIS — L821 Other seborrheic keratosis: Secondary | ICD-10-CM | POA: Diagnosis not present

## 2024-04-01 DIAGNOSIS — H9190 Unspecified hearing loss, unspecified ear: Secondary | ICD-10-CM | POA: Diagnosis not present

## 2024-04-01 DIAGNOSIS — J309 Allergic rhinitis, unspecified: Secondary | ICD-10-CM | POA: Diagnosis not present

## 2024-04-01 DIAGNOSIS — R251 Tremor, unspecified: Secondary | ICD-10-CM | POA: Diagnosis not present

## 2024-04-01 DIAGNOSIS — H539 Unspecified visual disturbance: Secondary | ICD-10-CM | POA: Diagnosis not present

## 2024-04-01 DIAGNOSIS — M542 Cervicalgia: Secondary | ICD-10-CM | POA: Diagnosis not present

## 2024-04-01 DIAGNOSIS — N529 Male erectile dysfunction, unspecified: Secondary | ICD-10-CM | POA: Diagnosis not present

## 2024-04-01 DIAGNOSIS — M1990 Unspecified osteoarthritis, unspecified site: Secondary | ICD-10-CM | POA: Diagnosis not present

## 2024-04-01 DIAGNOSIS — K5730 Diverticulosis of large intestine without perforation or abscess without bleeding: Secondary | ICD-10-CM | POA: Diagnosis not present

## 2024-04-01 DIAGNOSIS — I10 Essential (primary) hypertension: Secondary | ICD-10-CM | POA: Diagnosis not present

## 2024-04-01 DIAGNOSIS — R42 Dizziness and giddiness: Secondary | ICD-10-CM | POA: Diagnosis not present

## 2024-04-01 DIAGNOSIS — R1310 Dysphagia, unspecified: Secondary | ICD-10-CM | POA: Diagnosis not present

## 2024-04-01 DIAGNOSIS — K449 Diaphragmatic hernia without obstruction or gangrene: Secondary | ICD-10-CM | POA: Diagnosis not present

## 2024-04-01 DIAGNOSIS — E1151 Type 2 diabetes mellitus with diabetic peripheral angiopathy without gangrene: Secondary | ICD-10-CM | POA: Diagnosis not present

## 2024-04-01 DIAGNOSIS — K219 Gastro-esophageal reflux disease without esophagitis: Secondary | ICD-10-CM | POA: Diagnosis not present

## 2024-04-01 DIAGNOSIS — H9319 Tinnitus, unspecified ear: Secondary | ICD-10-CM | POA: Diagnosis not present

## 2024-04-01 DIAGNOSIS — G2581 Restless legs syndrome: Secondary | ICD-10-CM | POA: Diagnosis not present

## 2024-04-01 DIAGNOSIS — H532 Diplopia: Secondary | ICD-10-CM | POA: Diagnosis not present

## 2024-04-01 DIAGNOSIS — G43109 Migraine with aura, not intractable, without status migrainosus: Secondary | ICD-10-CM | POA: Diagnosis not present

## 2024-04-01 DIAGNOSIS — G1119 Other early-onset cerebellar ataxia: Secondary | ICD-10-CM | POA: Diagnosis not present

## 2024-04-03 ENCOUNTER — Ambulatory Visit: Payer: Self-pay | Admitting: Nurse Practitioner

## 2024-04-03 ENCOUNTER — Encounter: Payer: Self-pay | Admitting: Nurse Practitioner

## 2024-04-03 VITALS — BP 112/78 | HR 65 | Temp 97.6°F | Ht 68.0 in | Wt 174.6 lb

## 2024-04-03 DIAGNOSIS — E1169 Type 2 diabetes mellitus with other specified complication: Secondary | ICD-10-CM | POA: Diagnosis not present

## 2024-04-03 DIAGNOSIS — E7849 Other hyperlipidemia: Secondary | ICD-10-CM | POA: Diagnosis not present

## 2024-04-03 DIAGNOSIS — M1990 Unspecified osteoarthritis, unspecified site: Secondary | ICD-10-CM | POA: Diagnosis not present

## 2024-04-03 DIAGNOSIS — I10 Essential (primary) hypertension: Secondary | ICD-10-CM | POA: Diagnosis not present

## 2024-04-03 DIAGNOSIS — I6930 Unspecified sequelae of cerebral infarction: Secondary | ICD-10-CM | POA: Diagnosis not present

## 2024-04-03 DIAGNOSIS — Z23 Encounter for immunization: Secondary | ICD-10-CM | POA: Diagnosis not present

## 2024-04-03 DIAGNOSIS — E782 Mixed hyperlipidemia: Secondary | ICD-10-CM

## 2024-04-03 DIAGNOSIS — H532 Diplopia: Secondary | ICD-10-CM | POA: Diagnosis not present

## 2024-04-03 DIAGNOSIS — K219 Gastro-esophageal reflux disease without esophagitis: Secondary | ICD-10-CM

## 2024-04-03 DIAGNOSIS — G119 Hereditary ataxia, unspecified: Secondary | ICD-10-CM | POA: Diagnosis not present

## 2024-04-03 DIAGNOSIS — J309 Allergic rhinitis, unspecified: Secondary | ICD-10-CM | POA: Diagnosis not present

## 2024-04-03 DIAGNOSIS — G2581 Restless legs syndrome: Secondary | ICD-10-CM | POA: Diagnosis not present

## 2024-04-03 DIAGNOSIS — R251 Tremor, unspecified: Secondary | ICD-10-CM | POA: Diagnosis not present

## 2024-04-03 DIAGNOSIS — K5730 Diverticulosis of large intestine without perforation or abscess without bleeding: Secondary | ICD-10-CM | POA: Diagnosis not present

## 2024-04-03 DIAGNOSIS — M5134 Other intervertebral disc degeneration, thoracic region: Secondary | ICD-10-CM | POA: Diagnosis not present

## 2024-04-03 DIAGNOSIS — I739 Peripheral vascular disease, unspecified: Secondary | ICD-10-CM | POA: Diagnosis not present

## 2024-04-03 DIAGNOSIS — R42 Dizziness and giddiness: Secondary | ICD-10-CM | POA: Diagnosis not present

## 2024-04-03 DIAGNOSIS — G43109 Migraine with aura, not intractable, without status migrainosus: Secondary | ICD-10-CM | POA: Diagnosis not present

## 2024-04-03 DIAGNOSIS — H9190 Unspecified hearing loss, unspecified ear: Secondary | ICD-10-CM | POA: Diagnosis not present

## 2024-04-03 DIAGNOSIS — M542 Cervicalgia: Secondary | ICD-10-CM | POA: Diagnosis not present

## 2024-04-03 DIAGNOSIS — K449 Diaphragmatic hernia without obstruction or gangrene: Secondary | ICD-10-CM | POA: Diagnosis not present

## 2024-04-03 DIAGNOSIS — L821 Other seborrheic keratosis: Secondary | ICD-10-CM | POA: Diagnosis not present

## 2024-04-03 DIAGNOSIS — E1151 Type 2 diabetes mellitus with diabetic peripheral angiopathy without gangrene: Secondary | ICD-10-CM | POA: Diagnosis not present

## 2024-04-03 DIAGNOSIS — M791 Myalgia, unspecified site: Secondary | ICD-10-CM | POA: Diagnosis not present

## 2024-04-03 DIAGNOSIS — H539 Unspecified visual disturbance: Secondary | ICD-10-CM | POA: Diagnosis not present

## 2024-04-03 DIAGNOSIS — K209 Esophagitis, unspecified without bleeding: Secondary | ICD-10-CM | POA: Diagnosis not present

## 2024-04-03 DIAGNOSIS — N529 Male erectile dysfunction, unspecified: Secondary | ICD-10-CM | POA: Diagnosis not present

## 2024-04-03 DIAGNOSIS — I7102 Dissection of abdominal aorta: Secondary | ICD-10-CM | POA: Diagnosis not present

## 2024-04-03 DIAGNOSIS — G1119 Other early-onset cerebellar ataxia: Secondary | ICD-10-CM | POA: Diagnosis not present

## 2024-04-03 DIAGNOSIS — R1310 Dysphagia, unspecified: Secondary | ICD-10-CM | POA: Diagnosis not present

## 2024-04-03 DIAGNOSIS — H9319 Tinnitus, unspecified ear: Secondary | ICD-10-CM | POA: Diagnosis not present

## 2024-04-03 MED ORDER — MIRABEGRON ER 25 MG PO TB24: 25.0000 mg | ORAL_TABLET | Freq: Every day | ORAL | 1 refills | Status: DC

## 2024-04-03 NOTE — Progress Notes (Signed)
 Careteam: Patient Care Team: Caro Harlene POUR, NP as PCP - General (Geriatric Medicine) Delford Maude BROCKS, MD as PCP - Cardiology (Cardiology) Tat, Asberry RAMAN, DO as Consulting Physician (Neurology) Lita Lye, OD as Referring Physician (Optometry) Lbcardiology, Rounding, MD (Cardiology)  PLACE OF SERVICE:  [facility]  Advanced Directive information    No Known Allergies  Chief Complaint  Patient presents with   Medical Management of Chronic Issues    6 month routine follow up Pt will be getting flu shot  Pt had eye ecam Follow up regarding HP visit on 03/10/2024    HPI:  Discussed the use of AI scribe software for clinical note transcription with the patient, who gave verbal consent to proceed.  History of Present Illness Justin [name] is an 83 year old male with a history of stroke and diabetes who presents for follow-up after recent hospitalizations for stroke.  He was hospitalized twice recently due to stroke. The first hospitalization occurred in October when he experienced right arm weakness and dysarthria. Imaging revealed a subacute left PCA territory infarct in the occipital lobe. He was treated with aspirin  and Plavix for three weeks, followed by Plavix alone, and continued on atorvastatin .   During the second hospitalization, he experienced similar symptoms of speech difficulty and right arm weakness. Imaging showed stable subacute infarcts in the left parietal and occipital lobes. A loop recorder was implanted to monitor cardiac activity continuously.  He has diet-controlled diabetes with a recent A1c of 6.7.   He has peripheral artery disease and reports leg pain that occasionally wakes him at night.   He notes a decrease in core strength and increased difficulty walking post-stroke, which is being addressed with home health physical and occupational therapy.  He has a history of abdominal aortic dissection and is under vascular  follow-up.   Additionally, he has spinocerebellar ataxia, which contributes to his mobility issues, and he is under neurological care for this condition.  His current medications include atorvastatin  and Plavix. He has completed the course of aspirin . He has a good appetite and is working on dietary modifications to manage acid reflux, which remains problematic. He enjoys 'country cooking'.  His family history includes longevity, with his mother living to 6 years.   He has a scheduled diabetic eye exam and is monitoring his foot health.  No chest pain, shortness of breath, or palpitations. Reports right-sided weakness and difficulty with mobility post-stroke.    Review of Systems:  Review of Systems  Constitutional:  Negative for chills, fever and weight loss.  HENT:  Negative for tinnitus.   Respiratory:  Negative for cough, sputum production and shortness of breath.   Cardiovascular:  Negative for chest pain, palpitations and leg swelling.  Gastrointestinal:  Negative for abdominal pain, constipation, diarrhea and heartburn.  Genitourinary:  Negative for dysuria, frequency and urgency.  Musculoskeletal:  Negative for back pain, falls, joint pain and myalgias.  Skin: Negative.   Neurological:  Positive for weakness. Negative for dizziness and headaches.  Psychiatric/Behavioral:  Negative for depression and memory loss. The patient does not have insomnia.     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 03/06/2012   Diaphragmatic hernia without mention of obstruction or gangrene 1982   Hiatal hernia   Diplopia 2006   Disturbance of skin sensation  2003   Diverticulosis of colon (without mention of hemorrhage) 1997   Dizziness and giddiness 2001   Dysphagia, unspecified(787.20) 1999 and   Esophagitis, unspecified 1997   GERD (gastroesophageal reflux disease) 2003   Hypertension 2003   Impotence of organic origin 1999   Lack of coordination 09/07/2007   Lumbago 2003   Migraine with aura, without mention of intractable migraine without mention of status migrainosus 1997   Myalgia and myositis, unspecified 1999   Other and unspecified hyperlipidemia 2003   Other disorders of vitreous 2003   Other malaise and fatigue 2003   Other seborrheic keratosis 2013   Other seborrheic keratosis 04/13/2009   Pain in joint, site unspecified 04/13/2012   Personal history of fall 04/15/2011   Restless legs syndrome (RLS) 1997   Spinocerebellar disease, unspecified 05/01/2008   Unspecified hearing loss 2003   Unspecified tinnitus 04/15/2011   Past Surgical History:  Procedure Laterality Date   APPENDECTOMY  Age 59   boil removed     Dr. Ebbie   C3-4 anterior cervical surgery  1999   Dr. Carles   CARDIAC CATHETERIZATION  2001   Dr. Bulah   COLONOSCOPY  07/20/07   Dr. Jakie   EYE SURGERY Bilateral 2015   cataracts   LAMINOTOMY / EXCISION DISK POSTERIOR CERVICAL SPINE     Spinal Disk (?4-5)   LOOP RECORDER INSERTION N/A 03/13/2024   Procedure: LOOP RECORDER INSERTION;  Surgeon: Inocencio Soyla Lunger, MD;  Location: [facility] INVAS[facility];  Service: Cardiovascular;  Laterality: N/A;   Social History:   reports that he has never smoked. He quit smokeless tobacco use about 44 years ago.  His smokeless tobacco use included chew. He reports that he does not currently use alcohol. He reports that he does not use drugs.  Family History  Problem Relation Age of Onset   Heart disease Father        SCA with multiple family members with [facility]   Diabetes Neg Hx    Colon cancer Neg Hx    Colon polyps Neg Hx    Esophageal cancer Neg Hx     Kidney disease Neg Hx    Gallbladder disease Neg Hx     Medications: Patient's Medications  New Prescriptions   No medications on file  Previous Medications   ACETAMINOPHEN  (TYLENOL  8 HOUR ARTHRITIS PAIN) 650 MG CR TABLET    Take 1 tablet (650 mg total) by mouth 2 (two) times daily.   AMLODIPINE  (NORVASC ) 5 MG TABLET    TAKE 1 TABLET BY MOUTH EVERY DAY   ATORVASTATIN  (LIPITOR) 10 MG TABLET    TAKE 1 TABLET BY MOUTH EVERY DAY   BIOFLAVONOID PRODUCTS (BIOFLEX PO)    Take 1 tablet by mouth daily with lunch.   CHLORPHENIRAMINE MALEATE (ALLERGY RELIEF PO)    Take 1 tablet by mouth daily with lunch.   CHOLECALCIFEROL (VITAMIN D3 PO)    Take 1 tablet by mouth daily with lunch.   CLOPIDOGREL (PLAVIX) 75 MG TABLET    Take 1 tablet (75 mg total) by mouth daily.   HYDROCHLOROTHIAZIDE  (HYDRODIURIL ) 12.5 MG TABLET    TAKE 1 TABLET BY MOUTH EVERY DAY   LOSARTAN  (COZAAR ) 100 MG TABLET    Take 1 tablet (100 mg total) by mouth daily.   MULTIPLE VITAMIN (  MULTIVITAMIN) TABLET    Take 1 tablet by mouth daily with lunch.   PANTOPRAZOLE  (PROTONIX ) 40 MG TABLET    TAKE 1 TABLET BY MOUTH EVERY DAY   POTASSIUM CHLORIDE  (KLOR-CON ) 10 MEQ TABLET    TAKE 1 TABLET BY MOUTH EVERY DAY   PREGABALIN  (LYRICA ) 25 MG CAPSULE    TAKE 1 CAPSULE BY MOUTH THREE TIMES A DAY   PROBIOTIC PRODUCT (PROBIOTIC DAILY PO)    Take 1 tablet by mouth daily with lunch.   PROPRANOLOL  (INDERAL ) 40 MG TABLET    TAKE 1 TABLET BY MOUTH TWICE A DAY  Modified Medications   Modified Medication Previous Medication   MIRABEGRON  ER (MYRBETRIQ ) 25 MG TB24 TABLET MYRBETRIQ  25 MG TB24 tablet      Take 1 tablet (25 mg total) by mouth daily.    TAKE 1 TABLET (25 MG TOTAL) BY MOUTH DAILY.  Discontinued Medications   No medications on file    Physical Exam:  Vitals:   04/03/24 1024  BP: 112/78  Pulse: 65  Temp: 97.6 F (36.4 C)  SpO2: 94%  Weight: 174 lb 9.6 oz (79.2 kg)  Height: 5' 8 (1.727 m)   Body mass index is 26.55 kg/m. Wt Readings  from Last 3 Encounters:  04/03/24 174 lb 9.6 oz (79.2 kg)  03/10/24 177 lb 14.6 oz (80.7 kg)  03/07/24 181 lb (82.1 kg)    Physical Exam Constitutional:      General: He is not in acute distress.    Appearance: He is well-developed. He is not diaphoretic.  HENT:     Head: Normocephalic and atraumatic.     Right Ear: External ear normal.     Left Ear: External ear normal.     Mouth/Throat:     Pharynx: No oropharyngeal exudate.  Eyes:     Conjunctiva/sclera: Conjunctivae normal.     Pupils: Pupils are equal, round, and reactive to light.  Cardiovascular:     Rate and Rhythm: Normal rate and regular rhythm.     Heart sounds: Normal heart sounds.  Pulmonary:     Effort: Pulmonary effort is normal.     Breath sounds: Normal breath sounds.  Abdominal:     General: Bowel sounds are normal.     Palpations: Abdomen is soft.  Musculoskeletal:        General: No tenderness.     Cervical back: Normal range of motion and neck supple.     Right lower leg: No edema.     Left lower leg: No edema.  Skin:    General: Skin is warm and dry.  Neurological:     Mental Status: He is alert and oriented to person, place, and time.     Motor: Weakness present.     Gait: Gait abnormal.     Labs reviewed: Basic Metabolic Panel: Recent Labs    03/08/24 0230 03/10/24 1349 03/10/24 1353 03/11/24 0530  NA 136 139 141 139  K 3.6 4.2 4.2 3.6  CL 101 106 105 105  CO2 23 21*  --  20*  GLUCOSE 154* 149* 148* 130*  BUN 14 17 19 15   CREATININE 1.07 1.17 1.20 0.92  CALCIUM  9.1 9.7  --  9.0   Liver Function Tests: Recent Labs    03/08/24 0230 03/10/24 1349 03/11/24 0530  AST 23 43* 32  ALT 35 55* 50*  ALKPHOS 108 109 95  BILITOT 1.1 0.9 1.1  PROT 6.2* 6.7 5.8*  ALBUMIN 3.4* 3.8 3.2*  No results for input(s): LIPASE, AMYLASE in the last 8760 hours. No results for input(s): AMMONIA in the last 8760 hours. CBC: Recent Labs    04/16/23 0918 03/07/24 1035 03/08/24 0230  03/10/24 1349 03/10/24 1353 03/11/24 0530  WBC 9.0 11.6* 15.3* 15.4*  --  11.4*  NEUTROABS 4,635 6.1  --  7.8*  --   --   HGB 14.5 15.1 14.5 14.9 14.6 14.1  HCT 43.3 44.9 43.2 44.4 43.0 43.0  MCV 90.4 90.5 89.3 90.4  --  92.3  PLT 251 244 225 248  --  186   Lipid Panel: Recent Labs    04/16/23 0918 06/09/23 0909 03/08/24 0230  CHOL 217* 135 121  HDL 43 40 36*  LDLCALC 120* 62 44  TRIG 378* 279* 207*  CHOLHDL 5.0* 3.4 3.4   TSH: No results for input(s): TSH in the last 8760 hours. 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 Stable subacute infarcts on imaging. Completed 3 weeks of aspirin  and Plavix, now on Plavix alone. Loop recorder implanted for cardiac monitoring. - Continue Plavix alone. -Continue Lipitor.  - Continue home health physical and occupational therapy. - Follow up with neurologist on December 4th. - Monitor loop recorder with cardiologist.   Mixed hyperlipidemia Assessment & Plan: Continues on lipitor.  LDL at goal on last labs    Essential hypertension Assessment & Plan: Blood pressure well controlled, goal bp <140/90 Continue current medications and dietary modifications follow metabolic panel    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HCC) Assessment & Plan: Chronic condition with worsening mobility post-stroke. Increased wheelchair use. Under neurology follow-up. - Continue follow-up with neurology.   Gastroesophageal reflux disease without esophagitis Assessment & Plan: Ongoing symptoms managed with dietary modifications. - Continue dietary modifications to manage symptoms.   PAD (peripheral artery disease) Assessment & Plan: Peripheral artery disease with diminished pulses and  vascular changes. Recent leg pain possibly due to increased muscle use post-stroke. No current claudication. -continues on plavix and lipitor - Ensure adequate hydration and stretching. - Monitor for ongoing leg pain and report if persistent.   Type 2 diabetes mellitus with other specified complication, without long-term current use of insulin (HCC) Assessment & Plan: Type 2 diabetes well-controlled with diet. Recent A1c at 6.7%. - Continue diet control. - Obtained urine sample to check for proteinuria. -Encouraged dietary compliance, routine foot care/monitoring and to keep up with diabetic eye exams through ophthalmology    Orders: -     Microalbumin / creatinine urine ratio  Flu vaccine need -     Flu vaccine HIGH DOSE PF(Fluzone Trivalent)  Dissection of abdominal aorta (HCC) Assessment & Plan: History of abdominal aortic dissection, under vascular follow-up Chronic condition under regular vascular follow-up. - Continue vascular follow-up.   Other orders -     Mirabegron  ER; Take 1 tablet (25 mg total) by mouth daily.  Dispense: 90 tablet; Refill: 1     Return in about 5 months (around 09/01/2024) for routine follow up, labs at time of visit.:   Aydin Hink K. Caro BODILY [facility] [phone]

## 2024-04-04 ENCOUNTER — Ambulatory Visit: Payer: Self-pay | Admitting: Nurse Practitioner

## 2024-04-04 DIAGNOSIS — G1119 Other early-onset cerebellar ataxia: Secondary | ICD-10-CM | POA: Diagnosis not present

## 2024-04-04 DIAGNOSIS — R1310 Dysphagia, unspecified: Secondary | ICD-10-CM | POA: Diagnosis not present

## 2024-04-04 DIAGNOSIS — L821 Other seborrheic keratosis: Secondary | ICD-10-CM | POA: Diagnosis not present

## 2024-04-04 DIAGNOSIS — G2581 Restless legs syndrome: Secondary | ICD-10-CM | POA: Diagnosis not present

## 2024-04-04 DIAGNOSIS — N529 Male erectile dysfunction, unspecified: Secondary | ICD-10-CM | POA: Diagnosis not present

## 2024-04-04 DIAGNOSIS — K5730 Diverticulosis of large intestine without perforation or abscess without bleeding: Secondary | ICD-10-CM | POA: Diagnosis not present

## 2024-04-04 DIAGNOSIS — H539 Unspecified visual disturbance: Secondary | ICD-10-CM | POA: Diagnosis not present

## 2024-04-04 DIAGNOSIS — G43109 Migraine with aura, not intractable, without status migrainosus: Secondary | ICD-10-CM | POA: Diagnosis not present

## 2024-04-04 DIAGNOSIS — E7849 Other hyperlipidemia: Secondary | ICD-10-CM | POA: Diagnosis not present

## 2024-04-04 DIAGNOSIS — H9319 Tinnitus, unspecified ear: Secondary | ICD-10-CM | POA: Diagnosis not present

## 2024-04-04 DIAGNOSIS — R42 Dizziness and giddiness: Secondary | ICD-10-CM | POA: Diagnosis not present

## 2024-04-04 DIAGNOSIS — I7102 Dissection of abdominal aorta: Secondary | ICD-10-CM | POA: Diagnosis not present

## 2024-04-04 DIAGNOSIS — H9190 Unspecified hearing loss, unspecified ear: Secondary | ICD-10-CM | POA: Diagnosis not present

## 2024-04-04 DIAGNOSIS — R251 Tremor, unspecified: Secondary | ICD-10-CM | POA: Diagnosis not present

## 2024-04-04 DIAGNOSIS — K449 Diaphragmatic hernia without obstruction or gangrene: Secondary | ICD-10-CM | POA: Diagnosis not present

## 2024-04-04 DIAGNOSIS — J309 Allergic rhinitis, unspecified: Secondary | ICD-10-CM | POA: Diagnosis not present

## 2024-04-04 DIAGNOSIS — K209 Esophagitis, unspecified without bleeding: Secondary | ICD-10-CM | POA: Diagnosis not present

## 2024-04-04 DIAGNOSIS — E1151 Type 2 diabetes mellitus with diabetic peripheral angiopathy without gangrene: Secondary | ICD-10-CM | POA: Diagnosis not present

## 2024-04-04 DIAGNOSIS — M5134 Other intervertebral disc degeneration, thoracic region: Secondary | ICD-10-CM | POA: Diagnosis not present

## 2024-04-04 DIAGNOSIS — M791 Myalgia, unspecified site: Secondary | ICD-10-CM | POA: Diagnosis not present

## 2024-04-04 DIAGNOSIS — K219 Gastro-esophageal reflux disease without esophagitis: Secondary | ICD-10-CM | POA: Diagnosis not present

## 2024-04-04 DIAGNOSIS — M542 Cervicalgia: Secondary | ICD-10-CM | POA: Diagnosis not present

## 2024-04-04 DIAGNOSIS — H532 Diplopia: Secondary | ICD-10-CM | POA: Diagnosis not present

## 2024-04-04 DIAGNOSIS — I10 Essential (primary) hypertension: Secondary | ICD-10-CM | POA: Diagnosis not present

## 2024-04-04 DIAGNOSIS — M1990 Unspecified osteoarthritis, unspecified site: Secondary | ICD-10-CM | POA: Diagnosis not present

## 2024-04-04 LAB — MICROALBUMIN / CREATININE URINE RATIO
Creatinine, Urine: 90 mg/dL (ref 20–320)
Microalb Creat Ratio: 4 mg/g{creat} (ref <30)
Microalb, Ur: 0.4 mg/dL

## 2024-04-05 DIAGNOSIS — E1151 Type 2 diabetes mellitus with diabetic peripheral angiopathy without gangrene: Secondary | ICD-10-CM | POA: Diagnosis not present

## 2024-04-05 DIAGNOSIS — H539 Unspecified visual disturbance: Secondary | ICD-10-CM | POA: Diagnosis not present

## 2024-04-05 DIAGNOSIS — I10 Essential (primary) hypertension: Secondary | ICD-10-CM | POA: Diagnosis not present

## 2024-04-05 DIAGNOSIS — G2581 Restless legs syndrome: Secondary | ICD-10-CM | POA: Diagnosis not present

## 2024-04-05 DIAGNOSIS — M542 Cervicalgia: Secondary | ICD-10-CM | POA: Diagnosis not present

## 2024-04-05 DIAGNOSIS — H9319 Tinnitus, unspecified ear: Secondary | ICD-10-CM | POA: Diagnosis not present

## 2024-04-05 DIAGNOSIS — M791 Myalgia, unspecified site: Secondary | ICD-10-CM | POA: Diagnosis not present

## 2024-04-05 DIAGNOSIS — G43109 Migraine with aura, not intractable, without status migrainosus: Secondary | ICD-10-CM | POA: Diagnosis not present

## 2024-04-05 DIAGNOSIS — L821 Other seborrheic keratosis: Secondary | ICD-10-CM | POA: Diagnosis not present

## 2024-04-05 DIAGNOSIS — K5730 Diverticulosis of large intestine without perforation or abscess without bleeding: Secondary | ICD-10-CM | POA: Diagnosis not present

## 2024-04-05 DIAGNOSIS — N529 Male erectile dysfunction, unspecified: Secondary | ICD-10-CM | POA: Diagnosis not present

## 2024-04-05 DIAGNOSIS — J309 Allergic rhinitis, unspecified: Secondary | ICD-10-CM | POA: Diagnosis not present

## 2024-04-05 DIAGNOSIS — I7102 Dissection of abdominal aorta: Secondary | ICD-10-CM | POA: Diagnosis not present

## 2024-04-05 DIAGNOSIS — E7849 Other hyperlipidemia: Secondary | ICD-10-CM | POA: Diagnosis not present

## 2024-04-05 DIAGNOSIS — R42 Dizziness and giddiness: Secondary | ICD-10-CM | POA: Diagnosis not present

## 2024-04-05 DIAGNOSIS — M5134 Other intervertebral disc degeneration, thoracic region: Secondary | ICD-10-CM | POA: Diagnosis not present

## 2024-04-05 DIAGNOSIS — M1990 Unspecified osteoarthritis, unspecified site: Secondary | ICD-10-CM | POA: Diagnosis not present

## 2024-04-05 DIAGNOSIS — G1119 Other early-onset cerebellar ataxia: Secondary | ICD-10-CM | POA: Diagnosis not present

## 2024-04-05 DIAGNOSIS — K209 Esophagitis, unspecified without bleeding: Secondary | ICD-10-CM | POA: Diagnosis not present

## 2024-04-05 DIAGNOSIS — R1310 Dysphagia, unspecified: Secondary | ICD-10-CM | POA: Diagnosis not present

## 2024-04-05 DIAGNOSIS — H9190 Unspecified hearing loss, unspecified ear: Secondary | ICD-10-CM | POA: Diagnosis not present

## 2024-04-05 DIAGNOSIS — H532 Diplopia: Secondary | ICD-10-CM | POA: Diagnosis not present

## 2024-04-05 DIAGNOSIS — K449 Diaphragmatic hernia without obstruction or gangrene: Secondary | ICD-10-CM | POA: Diagnosis not present

## 2024-04-05 DIAGNOSIS — K219 Gastro-esophageal reflux disease without esophagitis: Secondary | ICD-10-CM | POA: Diagnosis not present

## 2024-04-05 DIAGNOSIS — R251 Tremor, unspecified: Secondary | ICD-10-CM | POA: Diagnosis not present

## 2024-04-06 ENCOUNTER — Other Ambulatory Visit: Payer: Self-pay | Admitting: Nurse Practitioner

## 2024-04-06 DIAGNOSIS — I10 Essential (primary) hypertension: Secondary | ICD-10-CM | POA: Diagnosis not present

## 2024-04-06 DIAGNOSIS — G1119 Other early-onset cerebellar ataxia: Secondary | ICD-10-CM | POA: Diagnosis not present

## 2024-04-06 DIAGNOSIS — H532 Diplopia: Secondary | ICD-10-CM | POA: Diagnosis not present

## 2024-04-06 DIAGNOSIS — E7849 Other hyperlipidemia: Secondary | ICD-10-CM | POA: Diagnosis not present

## 2024-04-06 DIAGNOSIS — M5134 Other intervertebral disc degeneration, thoracic region: Secondary | ICD-10-CM | POA: Diagnosis not present

## 2024-04-06 DIAGNOSIS — K219 Gastro-esophageal reflux disease without esophagitis: Secondary | ICD-10-CM | POA: Diagnosis not present

## 2024-04-06 DIAGNOSIS — G2581 Restless legs syndrome: Secondary | ICD-10-CM | POA: Diagnosis not present

## 2024-04-06 DIAGNOSIS — K5730 Diverticulosis of large intestine without perforation or abscess without bleeding: Secondary | ICD-10-CM | POA: Diagnosis not present

## 2024-04-06 DIAGNOSIS — M791 Myalgia, unspecified site: Secondary | ICD-10-CM | POA: Diagnosis not present

## 2024-04-06 DIAGNOSIS — I7102 Dissection of abdominal aorta: Secondary | ICD-10-CM | POA: Diagnosis not present

## 2024-04-06 DIAGNOSIS — H9190 Unspecified hearing loss, unspecified ear: Secondary | ICD-10-CM | POA: Diagnosis not present

## 2024-04-06 DIAGNOSIS — M1990 Unspecified osteoarthritis, unspecified site: Secondary | ICD-10-CM | POA: Diagnosis not present

## 2024-04-06 DIAGNOSIS — R1310 Dysphagia, unspecified: Secondary | ICD-10-CM | POA: Diagnosis not present

## 2024-04-06 DIAGNOSIS — K449 Diaphragmatic hernia without obstruction or gangrene: Secondary | ICD-10-CM | POA: Diagnosis not present

## 2024-04-06 DIAGNOSIS — R42 Dizziness and giddiness: Secondary | ICD-10-CM | POA: Diagnosis not present

## 2024-04-06 DIAGNOSIS — E1151 Type 2 diabetes mellitus with diabetic peripheral angiopathy without gangrene: Secondary | ICD-10-CM | POA: Diagnosis not present

## 2024-04-06 DIAGNOSIS — H539 Unspecified visual disturbance: Secondary | ICD-10-CM | POA: Diagnosis not present

## 2024-04-06 DIAGNOSIS — H9319 Tinnitus, unspecified ear: Secondary | ICD-10-CM | POA: Diagnosis not present

## 2024-04-06 DIAGNOSIS — J309 Allergic rhinitis, unspecified: Secondary | ICD-10-CM | POA: Diagnosis not present

## 2024-04-06 DIAGNOSIS — R251 Tremor, unspecified: Secondary | ICD-10-CM | POA: Diagnosis not present

## 2024-04-06 DIAGNOSIS — K209 Esophagitis, unspecified without bleeding: Secondary | ICD-10-CM | POA: Diagnosis not present

## 2024-04-06 DIAGNOSIS — L821 Other seborrheic keratosis: Secondary | ICD-10-CM | POA: Diagnosis not present

## 2024-04-06 DIAGNOSIS — G43109 Migraine with aura, not intractable, without status migrainosus: Secondary | ICD-10-CM | POA: Diagnosis not present

## 2024-04-06 DIAGNOSIS — N529 Male erectile dysfunction, unspecified: Secondary | ICD-10-CM | POA: Diagnosis not present

## 2024-04-06 DIAGNOSIS — M542 Cervicalgia: Secondary | ICD-10-CM | POA: Diagnosis not present

## 2024-04-07 ENCOUNTER — Telehealth: Payer: Self-pay

## 2024-04-07 NOTE — Telephone Encounter (Signed)
 Call returned to [facility] Agency and verbal orders authorized per [employer] standing order

## 2024-04-07 NOTE — Telephone Encounter (Signed)
 Copied from CRM #[id number]. Topic: Clinical - Home Health Verbal Orders >> Apr 06, 2024  4:56 PM Debby BROCKS wrote: Caller/Agency: [facility] Nurse Callback Number: [phone] Service Requested: Speech Therapy Frequency: Evaluation for now Any new concerns about the patient? Yes, whenever patient drinks water he begins coughing on occasion. This is what triggered the want of speech therapy evaluation

## 2024-04-09 DIAGNOSIS — E782 Mixed hyperlipidemia: Secondary | ICD-10-CM | POA: Insufficient documentation

## 2024-04-09 NOTE — Assessment & Plan Note (Addendum)
 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 Stable subacute infarcts on imaging. Completed 3 weeks of aspirin  and Plavix, now on Plavix alone. Loop recorder implanted for cardiac monitoring. - Continue Plavix alone. -Continue Lipitor.  - Continue home health physical and occupational therapy. - Follow up with neurologist on December 4th. - Monitor loop recorder with cardiologist.

## 2024-04-09 NOTE — Assessment & Plan Note (Signed)
 Type 2 diabetes well-controlled with diet. Recent A1c at 6.7%. - Continue diet control. - Obtained urine sample to check for proteinuria. -Encouraged dietary compliance, routine foot care/monitoring and to keep up with diabetic eye exams through ophthalmology

## 2024-04-09 NOTE — Assessment & Plan Note (Signed)
 History of abdominal aortic dissection, under vascular follow-up Chronic condition under regular vascular follow-up. - Continue vascular follow-up.

## 2024-04-09 NOTE — Assessment & Plan Note (Signed)
 Blood pressure well controlled, goal bp <140/90 Continue current medications and dietary modifications follow metabolic panel

## 2024-04-09 NOTE — Assessment & Plan Note (Addendum)
 Peripheral artery disease with diminished pulses and vascular changes. Recent leg pain possibly due to increased muscle use post-stroke. No current claudication. -continues on plavix and lipitor - Ensure adequate hydration and stretching. - Monitor for ongoing leg pain and report if persistent.

## 2024-04-09 NOTE — Assessment & Plan Note (Signed)
 Chronic condition with worsening mobility post-stroke. Increased wheelchair use. Under neurology follow-up. - Continue follow-up with neurology.

## 2024-04-09 NOTE — Assessment & Plan Note (Signed)
 Continues on lipitor.  LDL at goal on last labs

## 2024-04-09 NOTE — Assessment & Plan Note (Signed)
 Ongoing symptoms managed with dietary modifications. - Continue dietary modifications to manage symptoms.

## 2024-04-10 DIAGNOSIS — H26492 Other secondary cataract, left eye: Secondary | ICD-10-CM | POA: Diagnosis not present

## 2024-04-10 DIAGNOSIS — M1990 Unspecified osteoarthritis, unspecified site: Secondary | ICD-10-CM | POA: Diagnosis not present

## 2024-04-10 DIAGNOSIS — L821 Other seborrheic keratosis: Secondary | ICD-10-CM | POA: Diagnosis not present

## 2024-04-10 DIAGNOSIS — I10 Essential (primary) hypertension: Secondary | ICD-10-CM | POA: Diagnosis not present

## 2024-04-10 DIAGNOSIS — M5134 Other intervertebral disc degeneration, thoracic region: Secondary | ICD-10-CM | POA: Diagnosis not present

## 2024-04-10 DIAGNOSIS — I7102 Dissection of abdominal aorta: Secondary | ICD-10-CM | POA: Diagnosis not present

## 2024-04-10 DIAGNOSIS — M791 Myalgia, unspecified site: Secondary | ICD-10-CM | POA: Diagnosis not present

## 2024-04-10 DIAGNOSIS — E7849 Other hyperlipidemia: Secondary | ICD-10-CM | POA: Diagnosis not present

## 2024-04-10 DIAGNOSIS — G2581 Restless legs syndrome: Secondary | ICD-10-CM | POA: Diagnosis not present

## 2024-04-10 DIAGNOSIS — H9319 Tinnitus, unspecified ear: Secondary | ICD-10-CM | POA: Diagnosis not present

## 2024-04-10 DIAGNOSIS — K5730 Diverticulosis of large intestine without perforation or abscess without bleeding: Secondary | ICD-10-CM | POA: Diagnosis not present

## 2024-04-10 DIAGNOSIS — R251 Tremor, unspecified: Secondary | ICD-10-CM | POA: Diagnosis not present

## 2024-04-10 DIAGNOSIS — N529 Male erectile dysfunction, unspecified: Secondary | ICD-10-CM | POA: Diagnosis not present

## 2024-04-10 DIAGNOSIS — H539 Unspecified visual disturbance: Secondary | ICD-10-CM | POA: Diagnosis not present

## 2024-04-10 DIAGNOSIS — R1310 Dysphagia, unspecified: Secondary | ICD-10-CM | POA: Diagnosis not present

## 2024-04-10 DIAGNOSIS — R42 Dizziness and giddiness: Secondary | ICD-10-CM | POA: Diagnosis not present

## 2024-04-10 DIAGNOSIS — G43109 Migraine with aura, not intractable, without status migrainosus: Secondary | ICD-10-CM | POA: Diagnosis not present

## 2024-04-10 DIAGNOSIS — M542 Cervicalgia: Secondary | ICD-10-CM | POA: Diagnosis not present

## 2024-04-10 DIAGNOSIS — H532 Diplopia: Secondary | ICD-10-CM | POA: Diagnosis not present

## 2024-04-10 DIAGNOSIS — J309 Allergic rhinitis, unspecified: Secondary | ICD-10-CM | POA: Diagnosis not present

## 2024-04-10 DIAGNOSIS — E1151 Type 2 diabetes mellitus with diabetic peripheral angiopathy without gangrene: Secondary | ICD-10-CM | POA: Diagnosis not present

## 2024-04-10 DIAGNOSIS — H9190 Unspecified hearing loss, unspecified ear: Secondary | ICD-10-CM | POA: Diagnosis not present

## 2024-04-10 DIAGNOSIS — K219 Gastro-esophageal reflux disease without esophagitis: Secondary | ICD-10-CM | POA: Diagnosis not present

## 2024-04-10 DIAGNOSIS — G1119 Other early-onset cerebellar ataxia: Secondary | ICD-10-CM | POA: Diagnosis not present

## 2024-04-10 DIAGNOSIS — K449 Diaphragmatic hernia without obstruction or gangrene: Secondary | ICD-10-CM | POA: Diagnosis not present

## 2024-04-10 DIAGNOSIS — K209 Esophagitis, unspecified without bleeding: Secondary | ICD-10-CM | POA: Diagnosis not present

## 2024-04-10 DIAGNOSIS — Z961 Presence of intraocular lens: Secondary | ICD-10-CM | POA: Diagnosis not present

## 2024-04-11 ENCOUNTER — Other Ambulatory Visit (HOSPITAL_COMMUNITY): Payer: Self-pay

## 2024-04-12 DIAGNOSIS — I10 Essential (primary) hypertension: Secondary | ICD-10-CM | POA: Diagnosis not present

## 2024-04-12 DIAGNOSIS — H532 Diplopia: Secondary | ICD-10-CM | POA: Diagnosis not present

## 2024-04-12 DIAGNOSIS — H9319 Tinnitus, unspecified ear: Secondary | ICD-10-CM | POA: Diagnosis not present

## 2024-04-12 DIAGNOSIS — K5730 Diverticulosis of large intestine without perforation or abscess without bleeding: Secondary | ICD-10-CM | POA: Diagnosis not present

## 2024-04-12 DIAGNOSIS — H539 Unspecified visual disturbance: Secondary | ICD-10-CM | POA: Diagnosis not present

## 2024-04-12 DIAGNOSIS — N529 Male erectile dysfunction, unspecified: Secondary | ICD-10-CM | POA: Diagnosis not present

## 2024-04-12 DIAGNOSIS — M791 Myalgia, unspecified site: Secondary | ICD-10-CM | POA: Diagnosis not present

## 2024-04-12 DIAGNOSIS — L821 Other seborrheic keratosis: Secondary | ICD-10-CM | POA: Diagnosis not present

## 2024-04-12 DIAGNOSIS — G1119 Other early-onset cerebellar ataxia: Secondary | ICD-10-CM | POA: Diagnosis not present

## 2024-04-12 DIAGNOSIS — K209 Esophagitis, unspecified without bleeding: Secondary | ICD-10-CM | POA: Diagnosis not present

## 2024-04-12 DIAGNOSIS — M542 Cervicalgia: Secondary | ICD-10-CM | POA: Diagnosis not present

## 2024-04-12 DIAGNOSIS — M1990 Unspecified osteoarthritis, unspecified site: Secondary | ICD-10-CM | POA: Diagnosis not present

## 2024-04-12 DIAGNOSIS — R1310 Dysphagia, unspecified: Secondary | ICD-10-CM | POA: Diagnosis not present

## 2024-04-12 DIAGNOSIS — M5134 Other intervertebral disc degeneration, thoracic region: Secondary | ICD-10-CM | POA: Diagnosis not present

## 2024-04-12 DIAGNOSIS — K219 Gastro-esophageal reflux disease without esophagitis: Secondary | ICD-10-CM | POA: Diagnosis not present

## 2024-04-12 DIAGNOSIS — R251 Tremor, unspecified: Secondary | ICD-10-CM | POA: Diagnosis not present

## 2024-04-12 DIAGNOSIS — E1151 Type 2 diabetes mellitus with diabetic peripheral angiopathy without gangrene: Secondary | ICD-10-CM | POA: Diagnosis not present

## 2024-04-12 DIAGNOSIS — E7849 Other hyperlipidemia: Secondary | ICD-10-CM | POA: Diagnosis not present

## 2024-04-12 DIAGNOSIS — K449 Diaphragmatic hernia without obstruction or gangrene: Secondary | ICD-10-CM | POA: Diagnosis not present

## 2024-04-12 DIAGNOSIS — I7102 Dissection of abdominal aorta: Secondary | ICD-10-CM | POA: Diagnosis not present

## 2024-04-12 DIAGNOSIS — G43109 Migraine with aura, not intractable, without status migrainosus: Secondary | ICD-10-CM | POA: Diagnosis not present

## 2024-04-12 DIAGNOSIS — G2581 Restless legs syndrome: Secondary | ICD-10-CM | POA: Diagnosis not present

## 2024-04-12 DIAGNOSIS — R42 Dizziness and giddiness: Secondary | ICD-10-CM | POA: Diagnosis not present

## 2024-04-12 DIAGNOSIS — J309 Allergic rhinitis, unspecified: Secondary | ICD-10-CM | POA: Diagnosis not present

## 2024-04-12 DIAGNOSIS — H9190 Unspecified hearing loss, unspecified ear: Secondary | ICD-10-CM | POA: Diagnosis not present

## 2024-04-13 ENCOUNTER — Telehealth: Payer: Self-pay

## 2024-04-13 ENCOUNTER — Ambulatory Visit: Attending: Cardiology

## 2024-04-13 DIAGNOSIS — I639 Cerebral infarction, unspecified: Secondary | ICD-10-CM | POA: Diagnosis not present

## 2024-04-13 NOTE — Telephone Encounter (Signed)
 Copied from CRM #[id number]. Topic: Clinical - Home Health Verbal Orders >> Apr 13, 2024  8:47 AM Graeme ORN wrote: Caller/Agency: [name] OT [facility] Rushing Number: [phone] Service Requested: Occupational Therapy Frequency: Evaluated last week for OT - 1 time per week for one week, skip a week, 1 time per week for one week, - pain control, ulcer prevention, diabetic instruction, health promotion and safety Any new concerns about the patient? No

## 2024-04-13 NOTE — Telephone Encounter (Signed)
 Call returned to [facility] Agency and verbal orders authorized per [employer] standing order (left detailed voicemail)

## 2024-04-14 LAB — CUP PACEART REMOTE DEVICE CHECK
Date Time Interrogation Session: 20251113180928
Implantable Pulse Generator Implant Date: 20251013

## 2024-04-17 ENCOUNTER — Ambulatory Visit

## 2024-04-18 DIAGNOSIS — M791 Myalgia, unspecified site: Secondary | ICD-10-CM | POA: Diagnosis not present

## 2024-04-18 DIAGNOSIS — H539 Unspecified visual disturbance: Secondary | ICD-10-CM | POA: Diagnosis not present

## 2024-04-18 DIAGNOSIS — I7102 Dissection of abdominal aorta: Secondary | ICD-10-CM | POA: Diagnosis not present

## 2024-04-18 DIAGNOSIS — R251 Tremor, unspecified: Secondary | ICD-10-CM | POA: Diagnosis not present

## 2024-04-18 DIAGNOSIS — I10 Essential (primary) hypertension: Secondary | ICD-10-CM | POA: Diagnosis not present

## 2024-04-18 DIAGNOSIS — K219 Gastro-esophageal reflux disease without esophagitis: Secondary | ICD-10-CM | POA: Diagnosis not present

## 2024-04-18 DIAGNOSIS — J309 Allergic rhinitis, unspecified: Secondary | ICD-10-CM | POA: Diagnosis not present

## 2024-04-18 DIAGNOSIS — K209 Esophagitis, unspecified without bleeding: Secondary | ICD-10-CM | POA: Diagnosis not present

## 2024-04-18 DIAGNOSIS — G1119 Other early-onset cerebellar ataxia: Secondary | ICD-10-CM | POA: Diagnosis not present

## 2024-04-18 DIAGNOSIS — M5134 Other intervertebral disc degeneration, thoracic region: Secondary | ICD-10-CM | POA: Diagnosis not present

## 2024-04-18 DIAGNOSIS — H532 Diplopia: Secondary | ICD-10-CM | POA: Diagnosis not present

## 2024-04-18 DIAGNOSIS — K449 Diaphragmatic hernia without obstruction or gangrene: Secondary | ICD-10-CM | POA: Diagnosis not present

## 2024-04-18 DIAGNOSIS — H9319 Tinnitus, unspecified ear: Secondary | ICD-10-CM | POA: Diagnosis not present

## 2024-04-18 DIAGNOSIS — N529 Male erectile dysfunction, unspecified: Secondary | ICD-10-CM | POA: Diagnosis not present

## 2024-04-18 DIAGNOSIS — G43109 Migraine with aura, not intractable, without status migrainosus: Secondary | ICD-10-CM | POA: Diagnosis not present

## 2024-04-18 DIAGNOSIS — R1310 Dysphagia, unspecified: Secondary | ICD-10-CM | POA: Diagnosis not present

## 2024-04-18 DIAGNOSIS — H9190 Unspecified hearing loss, unspecified ear: Secondary | ICD-10-CM | POA: Diagnosis not present

## 2024-04-18 DIAGNOSIS — R42 Dizziness and giddiness: Secondary | ICD-10-CM | POA: Diagnosis not present

## 2024-04-18 DIAGNOSIS — E1151 Type 2 diabetes mellitus with diabetic peripheral angiopathy without gangrene: Secondary | ICD-10-CM | POA: Diagnosis not present

## 2024-04-18 DIAGNOSIS — M1990 Unspecified osteoarthritis, unspecified site: Secondary | ICD-10-CM | POA: Diagnosis not present

## 2024-04-18 DIAGNOSIS — M542 Cervicalgia: Secondary | ICD-10-CM | POA: Diagnosis not present

## 2024-04-18 DIAGNOSIS — E7849 Other hyperlipidemia: Secondary | ICD-10-CM | POA: Diagnosis not present

## 2024-04-18 DIAGNOSIS — G2581 Restless legs syndrome: Secondary | ICD-10-CM | POA: Diagnosis not present

## 2024-04-18 DIAGNOSIS — K5730 Diverticulosis of large intestine without perforation or abscess without bleeding: Secondary | ICD-10-CM | POA: Diagnosis not present

## 2024-04-18 DIAGNOSIS — L821 Other seborrheic keratosis: Secondary | ICD-10-CM | POA: Diagnosis not present

## 2024-04-18 NOTE — Progress Notes (Signed)
 Remote Loop Recorder Transmission

## 2024-04-19 DIAGNOSIS — K209 Esophagitis, unspecified without bleeding: Secondary | ICD-10-CM | POA: Diagnosis not present

## 2024-04-19 DIAGNOSIS — I10 Essential (primary) hypertension: Secondary | ICD-10-CM | POA: Diagnosis not present

## 2024-04-19 DIAGNOSIS — M791 Myalgia, unspecified site: Secondary | ICD-10-CM | POA: Diagnosis not present

## 2024-04-19 DIAGNOSIS — G1119 Other early-onset cerebellar ataxia: Secondary | ICD-10-CM | POA: Diagnosis not present

## 2024-04-19 DIAGNOSIS — K449 Diaphragmatic hernia without obstruction or gangrene: Secondary | ICD-10-CM | POA: Diagnosis not present

## 2024-04-19 DIAGNOSIS — E7849 Other hyperlipidemia: Secondary | ICD-10-CM | POA: Diagnosis not present

## 2024-04-19 DIAGNOSIS — K219 Gastro-esophageal reflux disease without esophagitis: Secondary | ICD-10-CM | POA: Diagnosis not present

## 2024-04-19 DIAGNOSIS — H532 Diplopia: Secondary | ICD-10-CM | POA: Diagnosis not present

## 2024-04-19 DIAGNOSIS — M5134 Other intervertebral disc degeneration, thoracic region: Secondary | ICD-10-CM | POA: Diagnosis not present

## 2024-04-19 DIAGNOSIS — G2581 Restless legs syndrome: Secondary | ICD-10-CM | POA: Diagnosis not present

## 2024-04-19 DIAGNOSIS — I7102 Dissection of abdominal aorta: Secondary | ICD-10-CM | POA: Diagnosis not present

## 2024-04-19 DIAGNOSIS — R42 Dizziness and giddiness: Secondary | ICD-10-CM | POA: Diagnosis not present

## 2024-04-19 DIAGNOSIS — H9319 Tinnitus, unspecified ear: Secondary | ICD-10-CM | POA: Diagnosis not present

## 2024-04-19 DIAGNOSIS — H9190 Unspecified hearing loss, unspecified ear: Secondary | ICD-10-CM | POA: Diagnosis not present

## 2024-04-19 DIAGNOSIS — J309 Allergic rhinitis, unspecified: Secondary | ICD-10-CM | POA: Diagnosis not present

## 2024-04-19 DIAGNOSIS — G43109 Migraine with aura, not intractable, without status migrainosus: Secondary | ICD-10-CM | POA: Diagnosis not present

## 2024-04-19 DIAGNOSIS — R1310 Dysphagia, unspecified: Secondary | ICD-10-CM | POA: Diagnosis not present

## 2024-04-19 DIAGNOSIS — E1151 Type 2 diabetes mellitus with diabetic peripheral angiopathy without gangrene: Secondary | ICD-10-CM | POA: Diagnosis not present

## 2024-04-19 DIAGNOSIS — L821 Other seborrheic keratosis: Secondary | ICD-10-CM | POA: Diagnosis not present

## 2024-04-19 DIAGNOSIS — R251 Tremor, unspecified: Secondary | ICD-10-CM | POA: Diagnosis not present

## 2024-04-19 DIAGNOSIS — H539 Unspecified visual disturbance: Secondary | ICD-10-CM | POA: Diagnosis not present

## 2024-04-19 DIAGNOSIS — M1990 Unspecified osteoarthritis, unspecified site: Secondary | ICD-10-CM | POA: Diagnosis not present

## 2024-04-19 DIAGNOSIS — M542 Cervicalgia: Secondary | ICD-10-CM | POA: Diagnosis not present

## 2024-04-19 DIAGNOSIS — K5730 Diverticulosis of large intestine without perforation or abscess without bleeding: Secondary | ICD-10-CM | POA: Diagnosis not present

## 2024-04-19 DIAGNOSIS — N529 Male erectile dysfunction, unspecified: Secondary | ICD-10-CM | POA: Diagnosis not present

## 2024-04-21 DIAGNOSIS — H9319 Tinnitus, unspecified ear: Secondary | ICD-10-CM

## 2024-04-21 DIAGNOSIS — Z9181 History of falling: Secondary | ICD-10-CM

## 2024-04-21 DIAGNOSIS — G1119 Other early-onset cerebellar ataxia: Secondary | ICD-10-CM | POA: Diagnosis not present

## 2024-04-21 DIAGNOSIS — L821 Other seborrheic keratosis: Secondary | ICD-10-CM

## 2024-04-21 DIAGNOSIS — Z8673 Personal history of transient ischemic attack (TIA), and cerebral infarction without residual deficits: Secondary | ICD-10-CM | POA: Diagnosis not present

## 2024-04-21 DIAGNOSIS — E1151 Type 2 diabetes mellitus with diabetic peripheral angiopathy without gangrene: Secondary | ICD-10-CM

## 2024-04-21 DIAGNOSIS — R1310 Dysphagia, unspecified: Secondary | ICD-10-CM

## 2024-04-21 DIAGNOSIS — I10 Essential (primary) hypertension: Secondary | ICD-10-CM | POA: Diagnosis not present

## 2024-04-21 DIAGNOSIS — K219 Gastro-esophageal reflux disease without esophagitis: Secondary | ICD-10-CM

## 2024-04-21 DIAGNOSIS — G2581 Restless legs syndrome: Secondary | ICD-10-CM

## 2024-04-21 DIAGNOSIS — I7102 Dissection of abdominal aorta: Secondary | ICD-10-CM

## 2024-04-21 DIAGNOSIS — E7849 Other hyperlipidemia: Secondary | ICD-10-CM | POA: Diagnosis not present

## 2024-04-24 DIAGNOSIS — I7102 Dissection of abdominal aorta: Secondary | ICD-10-CM | POA: Diagnosis not present

## 2024-04-24 DIAGNOSIS — M1990 Unspecified osteoarthritis, unspecified site: Secondary | ICD-10-CM | POA: Diagnosis not present

## 2024-04-24 DIAGNOSIS — N529 Male erectile dysfunction, unspecified: Secondary | ICD-10-CM | POA: Diagnosis not present

## 2024-04-24 DIAGNOSIS — G2581 Restless legs syndrome: Secondary | ICD-10-CM | POA: Diagnosis not present

## 2024-04-24 DIAGNOSIS — H532 Diplopia: Secondary | ICD-10-CM | POA: Diagnosis not present

## 2024-04-24 DIAGNOSIS — E7849 Other hyperlipidemia: Secondary | ICD-10-CM | POA: Diagnosis not present

## 2024-04-24 DIAGNOSIS — H9319 Tinnitus, unspecified ear: Secondary | ICD-10-CM | POA: Diagnosis not present

## 2024-04-24 DIAGNOSIS — R1310 Dysphagia, unspecified: Secondary | ICD-10-CM | POA: Diagnosis not present

## 2024-04-24 DIAGNOSIS — I10 Essential (primary) hypertension: Secondary | ICD-10-CM | POA: Diagnosis not present

## 2024-04-24 DIAGNOSIS — M791 Myalgia, unspecified site: Secondary | ICD-10-CM | POA: Diagnosis not present

## 2024-04-24 DIAGNOSIS — K219 Gastro-esophageal reflux disease without esophagitis: Secondary | ICD-10-CM | POA: Diagnosis not present

## 2024-04-24 DIAGNOSIS — E1151 Type 2 diabetes mellitus with diabetic peripheral angiopathy without gangrene: Secondary | ICD-10-CM | POA: Diagnosis not present

## 2024-04-24 DIAGNOSIS — M5134 Other intervertebral disc degeneration, thoracic region: Secondary | ICD-10-CM | POA: Diagnosis not present

## 2024-04-24 DIAGNOSIS — G1119 Other early-onset cerebellar ataxia: Secondary | ICD-10-CM | POA: Diagnosis not present

## 2024-04-24 DIAGNOSIS — R251 Tremor, unspecified: Secondary | ICD-10-CM | POA: Diagnosis not present

## 2024-04-24 DIAGNOSIS — L821 Other seborrheic keratosis: Secondary | ICD-10-CM | POA: Diagnosis not present

## 2024-04-24 DIAGNOSIS — M542 Cervicalgia: Secondary | ICD-10-CM | POA: Diagnosis not present

## 2024-04-24 DIAGNOSIS — J309 Allergic rhinitis, unspecified: Secondary | ICD-10-CM | POA: Diagnosis not present

## 2024-04-24 DIAGNOSIS — K5730 Diverticulosis of large intestine without perforation or abscess without bleeding: Secondary | ICD-10-CM | POA: Diagnosis not present

## 2024-04-24 DIAGNOSIS — H9190 Unspecified hearing loss, unspecified ear: Secondary | ICD-10-CM | POA: Diagnosis not present

## 2024-04-24 DIAGNOSIS — K449 Diaphragmatic hernia without obstruction or gangrene: Secondary | ICD-10-CM | POA: Diagnosis not present

## 2024-04-24 DIAGNOSIS — G43109 Migraine with aura, not intractable, without status migrainosus: Secondary | ICD-10-CM | POA: Diagnosis not present

## 2024-04-24 DIAGNOSIS — R42 Dizziness and giddiness: Secondary | ICD-10-CM | POA: Diagnosis not present

## 2024-04-24 DIAGNOSIS — K209 Esophagitis, unspecified without bleeding: Secondary | ICD-10-CM | POA: Diagnosis not present

## 2024-04-24 DIAGNOSIS — H539 Unspecified visual disturbance: Secondary | ICD-10-CM | POA: Diagnosis not present

## 2024-04-25 ENCOUNTER — Ambulatory Visit: Admitting: Neurology

## 2024-04-25 NOTE — Progress Notes (Unsigned)
 Cardiology Office Note    Date:  05/03/2024  ID:  Justin [name] 12/25/1940, MRN [id number] PCP:  Caro Harlene POUR, NP  Cardiologist:  Maude Emmer, MD  Electrophysiologist:  None   Chief Complaint: Follow up for CVA   History of Present Illness: Justin [name]    Justin [name] is a 83 y.o. 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 Alveta for chest pain, found to be GERD.  He was seen in 2022 in setting of shortness of breath, had previously been started on HCTZ and decreased to sodium with improvement.  Patient was last seen in clinic on 9//2025 by Dr. Emmer to establish care.  It was noted that patient had stable ABIs with no claudication, skin breakdown or ulcers.  His distal abdominal aorta dissection was monitored by [facility].  Recommended follow-up in 1 year.  Patient was admitted in October 2025 with transient left arm weakness/dysarthria, upon further evaluation with MRI imaging his found to have acute CVA with small subacute left PCA territory infarct in the occipital lobe, 30-day cardiac monitor was ordered, patient started on aspirin  and Plavix  for 3 weeks followed by Plavix  alone per neurology.  Patient return to the ED on 03/10/2024 after his caregiver noted onset of abnormal speech and right arm weakness, MRI of the brain showed new acute infarct in the left pons.  Echocardiogram indicated LVEF 70 to 75%, no RWMA, mild LVH, G1 DD, RV systolic function size was normal, no evidence of mitral Justin [name], no evidence of stenosis, aortic valve regurgitation was not visualized, no stenosis was present, agitated saline contrast bubble study was negative with no evidence of any interarterial shunt.  Patient had loop recorder inserted by Dr. Inocencio.  Patient was discharged on 03/13/2024.  Loop recorder interrogation on 04/27/2024 showed no evidence of atrial fibrillation or  arrhythmias.  Today he presents for hospital follow-up with his caregiver.  He reports that he has been doing well overall.  He denies any chest pain, shortness of breath, increased lower extremity edema, orthopnea or PND.  Patient denies any palpitations, presyncope or syncope.  They note that he has been doing well since his hospital discharge, has been becoming stronger.  He does have some ongoing right side weakness, has been working with physical therapy and tries to walk with a walker.  They deny any cardiac concerns or complaints today. ROS: .   Today he denies chest pain, shortness of breath, lower extremity edema, fatigue, palpitations, melena, hematuria, hemoptysis, diaphoresis, presyncope, syncope, orthopnea, and PND.  All other systems are reviewed and otherwise negative. Studies Reviewed: Justin [name]   EKG:  EKG is ordered today, personally reviewed, demonstrating  EKG Interpretation Date/Time:  Wednesday May 03 2024 10:36:55 EST Ventricular Rate:  73 PR Interval:  188 QRS Duration:  82 QT Interval:  390 QTC Calculation: 429 R Axis:   -45  Text Interpretation: Normal sinus rhythm Low voltage QRS No significant change since last tracing Confirmed by Falon Huesca [phone]) on 05/03/2024 12:35:41 PM   CV Studies: Cardiac studies reviewed are outlined and summarized above. Otherwise please see EMR for full report. Cardiac Studies & Procedures   ______________________________________________________________________________________________   STRESS TESTS  NM MYOCAR MULTI W/SPECT W 11/26/2016  Narrative  There was no ST segment deviation noted during stress.  No T wave inversion was noted during stress.  The study is normal.  This is a low  risk study.  The left ventricular ejection fraction is hyperdynamic (>65%).  Normal Lexiscan  nuclear stress test with no evidence of prior infarct or ischemia.   ECHOCARDIOGRAM  ECHOCARDIOGRAM COMPLETE 03/08/2024  Narrative ECHOCARDIOGRAM  REPORT    Patient Name:   Justin [name] Date of Exam: 03/08/2024 Medical Rec #:  [id number]       Height:       70.0 in Accession #:    [id number]      Weight:       181.0 lb Date of Birth:  13-Jan-1941       BSA:          2.001 m Patient Age:    82 years        BP:           150/77 mmHg Patient Gender: M               HR:           60 bpm. Exam Location:  Inpatient  Procedure: 2D Echo, Cardiac Doppler, Color Doppler and Saline Contrast Bubble Study (Both Spectral and Color Flow Doppler were utilized during procedure).  Indications:    TIA  History:        Patient has prior history of Echocardiogram examinations, most recent 04/19/2020. Signs/Symptoms:Altered Mental Status; Risk Factors:Hypertension and Dyslipidemia. Abdominal aortic dissection.  Sonographer:    Ellouise Mose RDCS Referring Phys: [id number] TIMOTHY S OPYD   Sonographer Comments: Technically difficult study due to poor echo windows and no subcostal window. IMPRESSIONS   1. Left ventricular ejection fraction, by estimation, is 70 to 75%. The left ventricle has hyperdynamic function. The left ventricle has no regional wall motion abnormalities. There is mild left ventricular hypertrophy. Left ventricular diastolic parameters are consistent with Grade I diastolic dysfunction (impaired relaxation). 2. Right ventricular systolic function is normal. The right ventricular size is normal. Tricuspid regurgitation signal is inadequate for assessing PA pressure. 3. The mitral valve is normal in structure. No evidence of mitral valve regurgitation. No evidence of mitral stenosis. 4. The aortic valve is tricuspid. Aortic valve regurgitation is not visualized. No aortic stenosis is present. 5. Agitated saline contrast bubble study was negative, with no evidence of any interatrial shunt.  Comparison(s): A prior study was performed on 04/19/2020. No significant change from prior study.  FINDINGS Left Ventricle: Left ventricular  ejection fraction, by estimation, is 70 to 75%. The left ventricle has hyperdynamic function. The left ventricle has no regional wall motion abnormalities. The left ventricular internal cavity size was normal in size. There is mild left ventricular hypertrophy. Left ventricular diastolic parameters are consistent with Grade I diastolic dysfunction (impaired relaxation).  Right Ventricle: The right ventricular size is normal. No increase in right ventricular wall thickness. Right ventricular systolic function is normal. Tricuspid regurgitation signal is inadequate for assessing PA pressure.  Left Atrium: Left atrial size was normal in size.  Right Atrium: Right atrial size was normal in size.  Pericardium: Trivial pericardial effusion is present.  Mitral Valve: The mitral valve is normal in structure. No evidence of mitral valve regurgitation. No evidence of mitral valve stenosis.  Tricuspid Valve: The tricuspid valve is normal in structure. Tricuspid valve regurgitation is not demonstrated. No evidence of tricuspid stenosis.  Aortic Valve: The aortic valve is tricuspid. Aortic valve regurgitation is not visualized. No aortic stenosis is present.  Pulmonic Valve: The pulmonic valve was normal in structure. Pulmonic valve regurgitation is not visualized. No evidence of pulmonic stenosis.  Aorta: The aortic root and ascending aorta are structurally normal, with no evidence of dilitation.  Venous: The inferior vena cava was not well visualized.  IAS/Shunts: No atrial level shunt detected by color flow Doppler. Agitated saline contrast was given intravenously to evaluate for intracardiac shunting. Agitated saline contrast bubble study was negative, with no evidence of any interatrial shunt.   LEFT VENTRICLE PLAX 2D LVIDd:         4.35 cm     Diastology LVIDs:         2.15 cm     LV e' medial:    4.79 cm/s LV PW:         0.93 cm     LV E/e' medial:  15.5 LV IVS:        1.00 cm     LV e'  lateral:   6.20 cm/s LVOT diam:     2.00 cm     LV E/e' lateral: 12.0 LV SV:         95 LV SV Index:   47 LVOT Area:     3.14 cm LV IVRT:       70 msec  LV Volumes (MOD) LV vol d, MOD A2C: 55.5 ml LV vol d, MOD A4C: 44.0 ml LV vol s, MOD A2C: 15.7 ml LV vol s, MOD A4C: 13.8 ml LV SV MOD A2C:     39.8 ml LV SV MOD A4C:     44.0 ml LV SV MOD BP:      35.0 ml  RIGHT VENTRICLE RV S prime:     17.40 cm/s TAPSE (M-mode): 2.9 cm  LEFT ATRIUM             Index LA diam:        3.59 cm 1.79 cm/m LA Vol (A2C):   27.6 ml 13.80 ml/m LA Vol (A4C):   24.5 ml 12.25 ml/m LA Biplane Vol: 27.4 ml 13.70 ml/m AORTIC VALVE LVOT Vmax:   147.00 cm/s LVOT Vmean:  98.500 cm/s LVOT VTI:    0.301 m  AORTA Ao Root diam: 3.01 cm Ao Asc diam:  3.52 cm  MITRAL VALVE MV Area (PHT): 3.27 cm    SHUNTS MV Decel Time: 232 msec    Systemic VTI:  0.30 m MV E velocity: 74.40 cm/s  Systemic Diam: 2.00 cm MV A velocity: 83.75 cm/s MV E/A ratio:  0.89  Emeline Calender Electronically signed by Emeline Calender Signature Date/Time: 03/08/2024/5:11:17 PM    Final          ______________________________________________________________________________________________       Current Reported Medications:.    Current Meds  Medication Sig   acetaminophen  (TYLENOL  8 HOUR ARTHRITIS PAIN) 650 MG CR tablet Take 1 tablet (650 mg total) by mouth 2 (two) times daily.   amLODipine  (NORVASC ) 5 MG tablet TAKE 1 TABLET BY MOUTH EVERY DAY   atorvastatin  (LIPITOR) 10 MG tablet TAKE 1 TABLET BY MOUTH EVERY DAY   Bioflavonoid Products (BIOFLEX PO) Take 1 tablet by mouth daily with lunch.   Chlorpheniramine Maleate (ALLERGY RELIEF PO) Take 1 tablet by mouth daily with lunch.   Cholecalciferol (VITAMIN D3 PO) Take 1 tablet by mouth daily with lunch.   clopidogrel  (PLAVIX ) 75 MG tablet Take 1 tablet (75 mg total) by mouth daily.   hydrochlorothiazide  (HYDRODIURIL ) 12.5 MG tablet TAKE 1 TABLET BY MOUTH EVERY DAY    losartan  (COZAAR ) 100 MG tablet Take 1 tablet (100 mg total) by mouth daily.   mirabegron   ER (MYRBETRIQ ) 25 MG TB24 tablet Take 1 tablet (25 mg total) by mouth daily.   Multiple Vitamin (MULTIVITAMIN) tablet Take 1 tablet by mouth daily with lunch.   pantoprazole  (PROTONIX ) 40 MG tablet TAKE 1 TABLET BY MOUTH EVERY DAY   potassium chloride  (KLOR-CON ) 10 MEQ tablet TAKE 1 TABLET BY MOUTH EVERY DAY   pregabalin  (LYRICA ) 25 MG capsule TAKE 1 CAPSULE BY MOUTH THREE TIMES A DAY   Probiotic Product (PROBIOTIC DAILY PO) Take 1 tablet by mouth daily with lunch.   propranolol  (INDERAL ) 40 MG tablet TAKE 1 TABLET BY MOUTH TWICE A DAY    Physical Exam:    VS:  BP 122/62 (BP Location: Left Arm, Patient Position: Sitting, Cuff Size: Large)   Pulse 74   Resp 17   Ht 5' 8 (1.727 m)   Wt 170 lb (77.1 kg)   SpO2 96%   BMI 25.85 kg/m    Wt Readings from Last 3 Encounters:  05/03/24 170 lb (77.1 kg)  04/03/24 174 lb 9.6 oz (79.2 kg)  03/10/24 177 lb 14.6 oz (80.7 kg)    GEN: Well nourished, well developed in no acute distress NECK: No JVD; No carotid bruits CARDIAC: RRR, no murmurs, rubs, gallops RESPIRATORY:  Clear to auscultation without rales, wheezing or rhonchi  ABDOMEN: Soft, non-tender, non-distended EXTREMITIES:  No edema; No acute deformity     Asessement and Plan:.    CVA: Patient admitted twice in October for CVAs.  Echocardiogram reassuring as noted above.  Loop recorder was placed given concerns for atrial fibrillation.  Recently recorder interrogation showed no evidence of atrial fibrillation or arrhythmias.  Patient is on Plavix  per neurology, plans to follow-up with them tomorrow.  Reviewed ED precautions.  HTN: Blood pressure today 122/62, continue amlodipine  5 mg daily, hydrochlorothiazide  12.5 mg daily, losartan  100 mg daily and propranolol  40 mg twice daily.  HLD: Last lipid profile in 03/08/2024 indicated total cholesterol 121, HDL 36, triglycerides 207 and LDL 44.  Continue  Lipitor 10 mg daily.   Disposition: F/u with Dr. Nishan in 6 months or sooner if needed.   Signed, Janelis Stelzer D Marvette Schamp, NP

## 2024-04-26 ENCOUNTER — Ambulatory Visit: Payer: Self-pay | Admitting: Cardiology

## 2024-05-02 NOTE — Progress Notes (Unsigned)
 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 Suspected embolic etiology, possibly cardiac.  Currently has a loop recorder. Echocardiogram performed; TEE recommended for further evaluation. - Continue Plavix  therapy. - Coordinate with cardiologist for TEE to evaluate posterior heart structures. - Continue atorvastatin  therapy. - Continue home physical therapy for left-sided weakness. - Signs and symptoms of stroke discussed and importance of getting to the emergency room should he experience any of these in the future. - CTA head and neck negative for large vessel stenosis.  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 - Arranged speech therapy for dysphagia management. - Patient was given an upright U step walker to trial in the office today with standby assistance.  His caregiver notes he did much better in the office with that man at home with a traditional walker.  Information was given on this walker.    Subjective:   Discussed the use of AI scribe software for clinical note transcription with the patient, who gave verbal consent to proceed.  History of Present Illness Justin [name] is an 83 year old male with spinocerebellar ataxia type six who presents with acute left-sided weakness and recent strokes.  He presents today with his caregiver, [name], who supplements the history.  He experienced acute left-sided weakness on October 7th, leading to hospital admission. An MRI on October 8th revealed a subacute left PCA infarct in the occipital lobe and advancing cerebellar atrophy. He was discharged on October 9th but returned on October 10th with acute  left arm tingling, facial droop on the left, and left arm weakness. A subsequent MRI showed a new acute infarct in the left pons and a late acute/subacute infarct in the left occipital lobe.  Initially, he was placed on aspirin  81 mg after the first hospital admission. Upon readmission, he was started on a combination of aspirin  and Plavix , later transitioning to Plavix  alone. He continues his home antihypertensive medications, including losartan  100 mg, propranolol  40 mg, amlodipine  5 mg, and hydrochlorothiazide , as well as atorvastatin  10 mg. His LDL is well-controlled at 44, below the target of less than 70.  He has a history of spinocerebellar ataxia type six, diagnosed in 2021, with symptoms including depression, diplopia, vertigo, nystagmus, ataxic/Holmes tremor, dysphagia, and dysarthric speech. He experiences ongoing issues with swallowing, often leading to coughing when liquids go down the wrong pipe.  This happens near daily and he plans to start swallow therapy once he is done with home physical therapy.  He is undergoing home physical therapy to address right-sided weakness, which has not fully resolved. His caregiver assists him with mobility, noting that he tends to lean due to balance issues and weakness. He uses a regular walker with larger wheels for exercise within the home.  He is having more trouble using this.     ALLERGIES:  No Known Allergies  CURRENT MEDICATIONS:  Outpatient Encounter Medications as of 05/04/2024  Medication Sig   acetaminophen  (TYLENOL  8 HOUR ARTHRITIS PAIN) 650 MG CR tablet Take 1 tablet (650 mg total) by mouth 2 (two) times daily.   amLODipine  (NORVASC ) 5 MG tablet TAKE 1 TABLET BY MOUTH EVERY DAY   atorvastatin  (LIPITOR) 10 MG tablet TAKE 1 TABLET BY MOUTH EVERY DAY  Bioflavonoid Products (BIOFLEX PO) Take 1 tablet by mouth daily with lunch.   Chlorpheniramine Maleate (ALLERGY RELIEF PO) Take 1 tablet by mouth daily with lunch.   Cholecalciferol  (VITAMIN D3 PO) Take 1 tablet by mouth daily with lunch.   clopidogrel  (PLAVIX ) 75 MG tablet Take 1 tablet (75 mg total) by mouth daily.   hydrochlorothiazide  (HYDRODIURIL ) 12.5 MG tablet TAKE 1 TABLET BY MOUTH EVERY DAY   losartan  (COZAAR ) 100 MG tablet Take 1 tablet (100 mg total) by mouth daily.   mirabegron  ER (MYRBETRIQ ) 25 MG TB24 tablet Take 1 tablet (25 mg total) by mouth daily.   Multiple Vitamin (MULTIVITAMIN) tablet Take 1 tablet by mouth daily with lunch.   pantoprazole  (PROTONIX ) 40 MG tablet TAKE 1 TABLET BY MOUTH EVERY DAY   potassium chloride  (KLOR-CON ) 10 MEQ tablet TAKE 1 TABLET BY MOUTH EVERY DAY   pregabalin  (LYRICA ) 25 MG capsule TAKE 1 CAPSULE BY MOUTH THREE TIMES A DAY   Probiotic Product (PROBIOTIC DAILY PO) Take 1 tablet by mouth daily with lunch.   propranolol  (INDERAL ) 40 MG tablet TAKE 1 TABLET BY MOUTH TWICE A DAY   [DISCONTINUED] pantoprazole  (PROTONIX ) 40 MG tablet TAKE 1 TABLET BY MOUTH EVERY DAY   No facility-administered encounter medications on file as of 05/04/2024.    Objective:   PHYSICAL EXAMINATION:    VITALS:   Vitals:   05/04/24 1317  BP: 126/78  Pulse: 60  SpO2: 97%  Weight: 176 lb (79.8 kg)    GEN:  Normal appears male in no acute distress.  Appears stated age. HEENT:  Normocephalic, atraumatic. The mucous membranes are moist. The superficial temporal arteries are without ropiness or tenderness. Cardiovascular: Regular rate and rhythm. Lungs: Clear to auscultation bilaterally. Neck/Heme: There are no carotid bruits noted bilaterally.  NEUROLOGICAL: Orientation:  The patient is alert and oriented x 3.   Cranial nerves: There is good facial symmetry.  Extraocular muscles are intact and visual fields are full to confrontational testing.  Square wave jerks are present.  Saccades are jerky.  There is horizontal nystagmus.  Speech is fluent but he has significant trouble with the guttural sounds.  His speech is slow bulbar and dysarthric in  quality.  Soft palate rises symmetrically and there is no tongue deviation. Hearing is intact to conversational tone. Tone: Tone is good throughout. Sensation: Sensation is intact to light touch throughout Coordination:  The patient has no difficulty with RAM's or FNF bilaterally. Motor: Strength is 5/5 in the bilateral upper and lower extremities.  Shoulder shrug is equal and symmetric. There is no pronator drift.  There are no fasciculations noted. Gait and Station: Patient requires assistance out of the wheelchair.  He is given an upright U step and even with that requires some assistance.  He is ataxic when he ambulates, but caregiver reports he is doing much better with the upright U step than he does with a traditional walker.    Total time spent on today's visit was 45 minutes, including both face-to-face time and nonface-to-face time.  Time included that spent on review of records (prior notes available to me/labs/imaging if pertinent), discussing treatment and goals, answering patient's questions and coordinating care.   Cc:  Caro Harlene POUR, NP

## 2024-05-03 ENCOUNTER — Ambulatory Visit: Attending: Cardiology | Admitting: Cardiology

## 2024-05-03 ENCOUNTER — Encounter: Payer: Self-pay | Admitting: Cardiology

## 2024-05-03 VITALS — BP 122/62 | HR 74 | Resp 17 | Ht 68.0 in | Wt 170.0 lb

## 2024-05-03 DIAGNOSIS — E782 Mixed hyperlipidemia: Secondary | ICD-10-CM | POA: Diagnosis not present

## 2024-05-03 DIAGNOSIS — I639 Cerebral infarction, unspecified: Secondary | ICD-10-CM | POA: Diagnosis not present

## 2024-05-03 DIAGNOSIS — I10 Essential (primary) hypertension: Secondary | ICD-10-CM | POA: Diagnosis not present

## 2024-05-03 DIAGNOSIS — I739 Peripheral vascular disease, unspecified: Secondary | ICD-10-CM

## 2024-05-03 NOTE — Patient Instructions (Signed)
 Medication Instructions:   Your physician recommends that you continue on your current medications as directed. Please refer to the Current Medication list given to you today.   *If you need a refill on your cardiac medications before your next appointment, please call your pharmacy*  Lab Work: NONE ORDERED  TODAY    If you have labs (blood work) drawn today and your tests are completely normal, you will receive your results only by: MyChart Message (if you have MyChart) OR A paper copy in the mail If you have any lab test that is abnormal or we need to change your treatment, we will call you to review the results.  Testing/Procedures: NONE ORDERED  TODAY    Follow-Up: At [facility], you and your health needs are our priority.  As part of our continuing mission to provide you with exceptional heart care, our providers are all part of one team.  This team includes your primary Cardiologist (physician) and Advanced Practice Providers or APPs (Physician Assistants and Nurse Practitioners) who all work together to provide you with the care you need, when you need it.  Your next appointment:  6 month(s)  Provider:    Maude Emmer, MD    We recommend signing up for the patient portal called MyChart.  Sign up information is provided on this After Visit Summary.  MyChart is used to connect with patients for Virtual Visits (Telemedicine).  Patients are able to view lab/test results, encounter notes, upcoming appointments, etc.  Non-urgent messages can be sent to your provider as well.   To learn more about what you can do with MyChart, go to [email or web address].   Other Instructions

## 2024-05-04 ENCOUNTER — Other Ambulatory Visit: Payer: Self-pay | Admitting: Nurse Practitioner

## 2024-05-04 ENCOUNTER — Ambulatory Visit: Admitting: Neurology

## 2024-05-04 ENCOUNTER — Encounter: Payer: Self-pay | Admitting: Neurology

## 2024-05-04 ENCOUNTER — Telehealth: Payer: Self-pay | Admitting: Neurology

## 2024-05-04 VITALS — BP 126/78 | HR 60 | Wt 176.0 lb

## 2024-05-04 DIAGNOSIS — G118 Other hereditary ataxias: Secondary | ICD-10-CM

## 2024-05-04 DIAGNOSIS — I63432 Cerebral infarction due to embolism of left posterior cerebral artery: Secondary | ICD-10-CM

## 2024-05-04 DIAGNOSIS — QA00102 CACNA1A-related neurodevelopmental disorder: Secondary | ICD-10-CM

## 2024-05-04 DIAGNOSIS — K219 Gastro-esophageal reflux disease without esophagitis: Secondary | ICD-10-CM

## 2024-05-04 NOTE — Telephone Encounter (Signed)
 Justin [name], let patient/caregiver know that I did speak to Dr. Delford as I told the patient that I would.  He did not think that risks outweigh benefits for him to do a TEE so we will hold on that for now.

## 2024-05-04 NOTE — Patient Instructions (Signed)
  VISIT SUMMARY: You, Justin [name], visited us  today to discuss your recent strokes and ongoing issues related to spinocerebellar ataxia type six. We reviewed your recent MRI results and current medications, and we have made some adjustments to your treatment plan to help manage your symptoms and prevent further complications.  YOUR PLAN: -ACUTE CEREBRAL INFARCTS OF LEFT OCCIPITAL LOBE AND LEFT PONS WITH LEFT-SIDED WEAKNESS AND FACIAL DROOP: You have experienced strokes in the left occipital lobe and left pons, which have caused weakness and facial drooping on your left side. We suspect these strokes may be due to a clot that traveled from your heart. We will continue your Plavix  therapy to prevent further clots, and you should continue taking atorvastatin  to manage your cholesterol. We will also coordinate with your cardiologist to perform a transesophageal echocardiogram (TEE) to get a better look at your heart. Additionally, you should continue with your home physical therapy to help improve your left-sided weakness.  -SPINOCEREBELLAR ATAXIA TYPE 6 WITH ASSOCIATED DYSPHAGIA, DYSARTHRIA, AND NYSTAGMUS: Spinocerebellar ataxia type six is a genetic condition that affects your coordination and balance, and it has caused difficulties with swallowing, speech, and eye movements. To help manage your swallowing difficulties, we have arranged for you to start speech therapy.  INSTRUCTIONS: Please continue taking Plavix  and atorvastatin  as prescribed. Follow up with your cardiologist to schedule the TEE. Keep up with your home physical therapy sessions to address your left-sided weakness. Additionally, start attending the speech therapy sessions we have arranged for you to help with your swallowing difficulties.                      Contains text generated by Abridge.                                 Contains text generated by Abridge.                                   Contains text generated by [employer]bridge.

## 2024-05-14 ENCOUNTER — Ambulatory Visit: Attending: Cardiology

## 2024-05-14 DIAGNOSIS — I639 Cerebral infarction, unspecified: Secondary | ICD-10-CM

## 2024-05-16 LAB — CUP PACEART REMOTE DEVICE CHECK
Date Time Interrogation Session: 20251214181044
Implantable Pulse Generator Implant Date: 20251013

## 2024-05-18 ENCOUNTER — Ambulatory Visit

## 2024-05-19 ENCOUNTER — Emergency Department (HOSPITAL_COMMUNITY)

## 2024-05-19 ENCOUNTER — Inpatient Hospital Stay (HOSPITAL_COMMUNITY)
Admission: EM | Admit: 2024-05-19 | Discharge: 2024-06-01 | DRG: 062 | Disposition: E | Attending: Hospitalist | Admitting: Hospitalist

## 2024-05-19 ENCOUNTER — Other Ambulatory Visit: Payer: Self-pay

## 2024-05-19 ENCOUNTER — Encounter (HOSPITAL_COMMUNITY): Payer: Self-pay

## 2024-05-19 DIAGNOSIS — G8194 Hemiplegia, unspecified affecting left nondominant side: Secondary | ICD-10-CM | POA: Diagnosis present

## 2024-05-19 DIAGNOSIS — Z993 Dependence on wheelchair: Secondary | ICD-10-CM

## 2024-05-19 DIAGNOSIS — Z79899 Other long term (current) drug therapy: Secondary | ICD-10-CM

## 2024-05-19 DIAGNOSIS — R29711 NIHSS score 11: Secondary | ICD-10-CM

## 2024-05-19 DIAGNOSIS — Z7982 Long term (current) use of aspirin: Secondary | ICD-10-CM

## 2024-05-19 DIAGNOSIS — E785 Hyperlipidemia, unspecified: Secondary | ICD-10-CM | POA: Diagnosis present

## 2024-05-19 DIAGNOSIS — G2581 Restless legs syndrome: Secondary | ICD-10-CM | POA: Diagnosis present

## 2024-05-19 DIAGNOSIS — I10 Essential (primary) hypertension: Secondary | ICD-10-CM | POA: Diagnosis present

## 2024-05-19 DIAGNOSIS — K219 Gastro-esophageal reflux disease without esophagitis: Secondary | ICD-10-CM | POA: Diagnosis present

## 2024-05-19 DIAGNOSIS — R29716 NIHSS score 16: Secondary | ICD-10-CM | POA: Diagnosis present

## 2024-05-19 DIAGNOSIS — Z95818 Presence of other cardiac implants and grafts: Secondary | ICD-10-CM

## 2024-05-19 DIAGNOSIS — Z66 Do not resuscitate: Secondary | ICD-10-CM | POA: Diagnosis present

## 2024-05-19 DIAGNOSIS — I739 Peripheral vascular disease, unspecified: Secondary | ICD-10-CM | POA: Diagnosis not present

## 2024-05-19 DIAGNOSIS — Z8673 Personal history of transient ischemic attack (TIA), and cerebral infarction without residual deficits: Secondary | ICD-10-CM | POA: Diagnosis not present

## 2024-05-19 DIAGNOSIS — R1310 Dysphagia, unspecified: Secondary | ICD-10-CM | POA: Diagnosis present

## 2024-05-19 DIAGNOSIS — Z7902 Long term (current) use of antithrombotics/antiplatelets: Secondary | ICD-10-CM | POA: Diagnosis not present

## 2024-05-19 DIAGNOSIS — H9190 Unspecified hearing loss, unspecified ear: Secondary | ICD-10-CM | POA: Diagnosis present

## 2024-05-19 DIAGNOSIS — I6350 Cerebral infarction due to unspecified occlusion or stenosis of unspecified cerebral artery: Secondary | ICD-10-CM | POA: Diagnosis present

## 2024-05-19 DIAGNOSIS — G119 Hereditary ataxia, unspecified: Secondary | ICD-10-CM | POA: Diagnosis present

## 2024-05-19 DIAGNOSIS — E119 Type 2 diabetes mellitus without complications: Secondary | ICD-10-CM | POA: Diagnosis present

## 2024-05-19 DIAGNOSIS — R29810 Facial weakness: Secondary | ICD-10-CM | POA: Diagnosis present

## 2024-05-19 DIAGNOSIS — I6322 Cerebral infarction due to unspecified occlusion or stenosis of basilar arteries: Principal | ICD-10-CM | POA: Diagnosis present

## 2024-05-19 DIAGNOSIS — R471 Dysarthria and anarthria: Secondary | ICD-10-CM | POA: Diagnosis present

## 2024-05-19 DIAGNOSIS — I639 Cerebral infarction, unspecified: Secondary | ICD-10-CM | POA: Diagnosis not present

## 2024-05-19 DIAGNOSIS — Z8249 Family history of ischemic heart disease and other diseases of the circulatory system: Secondary | ICD-10-CM | POA: Diagnosis not present

## 2024-05-19 DIAGNOSIS — Z87891 Personal history of nicotine dependence: Secondary | ICD-10-CM

## 2024-05-19 DIAGNOSIS — Z515 Encounter for palliative care: Secondary | ICD-10-CM

## 2024-05-19 DIAGNOSIS — I69391 Dysphagia following cerebral infarction: Secondary | ICD-10-CM | POA: Diagnosis not present

## 2024-05-19 LAB — GLUCOSE, CAPILLARY
Glucose-Capillary: 116 mg/dL — ABNORMAL HIGH (ref 70–99)
Glucose-Capillary: 124 mg/dL — ABNORMAL HIGH (ref 70–99)
Glucose-Capillary: 141 mg/dL — ABNORMAL HIGH (ref 70–99)

## 2024-05-19 LAB — ETHANOL: Alcohol, Ethyl (B): <15 mg/dL

## 2024-05-19 LAB — CBC
HCT: 45 % (ref 39.0–52.0)
Hemoglobin: 15 g/dL (ref 13.0–17.0)
MCH: 30.3 pg (ref 26.0–34.0)
MCHC: 33.3 g/dL (ref 30.0–36.0)
MCV: 90.9 fL (ref 80.0–100.0)
Platelets: 263 K/uL (ref 150–400)
RBC: 4.95 MIL/uL (ref 4.22–5.81)
RDW: 13.2 % (ref 11.5–15.5)
WBC: 13.3 K/uL — ABNORMAL HIGH (ref 4.0–10.5)
nRBC: 0 % (ref 0.0–0.2)

## 2024-05-19 LAB — PROTIME-INR
INR: 0.9 (ref 0.8–1.2)
Prothrombin Time: 12.9 s (ref 11.4–15.2)

## 2024-05-19 LAB — COMPREHENSIVE METABOLIC PANEL WITH GFR
ALT: 31 U/L (ref 0–44)
AST: 25 U/L (ref 15–41)
Albumin: 4.3 g/dL (ref 3.5–5.0)
Alkaline Phosphatase: 142 U/L — ABNORMAL HIGH (ref 38–126)
Anion gap: 11 (ref 5–15)
BUN: 15 mg/dL (ref 8–23)
CO2: 23 mmol/L (ref 22–32)
Calcium: 10.3 mg/dL (ref 8.9–10.3)
Chloride: 101 mmol/L (ref 98–111)
Creatinine, Ser: 1.04 mg/dL (ref 0.61–1.24)
GFR, Estimated: >60 mL/min
Glucose, Bld: 146 mg/dL — ABNORMAL HIGH (ref 70–99)
Potassium: 4.3 mmol/L (ref 3.5–5.1)
Sodium: 136 mmol/L (ref 135–145)
Total Bilirubin: 0.5 mg/dL (ref 0.0–1.2)
Total Protein: 6.9 g/dL (ref 6.5–8.1)

## 2024-05-19 LAB — DIFFERENTIAL
Abs Immature Granulocytes: 0.03 K/uL (ref 0.00–0.07)
Basophils Absolute: 0.1 K/uL (ref 0.0–0.1)
Basophils Relative: 1 %
Eosinophils Absolute: 0.2 K/uL (ref 0.0–0.5)
Eosinophils Relative: 1 %
Immature Granulocytes: 0 %
Lymphocytes Relative: 41 %
Lymphs Abs: 5.4 K/uL — ABNORMAL HIGH (ref 0.7–4.0)
Monocytes Absolute: 1.2 K/uL — ABNORMAL HIGH (ref 0.1–1.0)
Monocytes Relative: 9 %
Neutro Abs: 6.5 K/uL (ref 1.7–7.7)
Neutrophils Relative %: 48 %
Smear Review: NORMAL

## 2024-05-19 LAB — I-STAT CHEM 8, ED
BUN: 15 mg/dL (ref 8–23)
Calcium, Ion: 1.25 mmol/L (ref 1.15–1.40)
Chloride: 104 mmol/L (ref 98–111)
Creatinine, Ser: 1.2 mg/dL (ref 0.61–1.24)
Glucose, Bld: 148 mg/dL — ABNORMAL HIGH (ref 70–99)
HCT: 45 % (ref 39.0–52.0)
Hemoglobin: 15.3 g/dL (ref 13.0–17.0)
Potassium: 4.2 mmol/L (ref 3.5–5.1)
Sodium: 137 mmol/L (ref 135–145)
TCO2: 22 mmol/L (ref 22–32)

## 2024-05-19 LAB — APTT: aPTT: 28 s (ref 24–36)

## 2024-05-19 LAB — CBG MONITORING, ED: Glucose-Capillary: 144 mg/dL — ABNORMAL HIGH (ref 70–99)

## 2024-05-19 LAB — MRSA NEXT GEN BY PCR, NASAL: MRSA by PCR Next Gen: NOT DETECTED

## 2024-05-19 MED ORDER — LABETALOL HCL 5 MG/ML IV SOLN: 10.0000 mg | INTRAVENOUS | Status: DC | PRN

## 2024-05-19 MED ORDER — SODIUM CHLORIDE 0.9 % IV SOLN: INTRAVENOUS | Status: DC

## 2024-05-19 MED ORDER — MIRABEGRON ER 25 MG PO TB24
25.0000 mg | ORAL_TABLET | Freq: Every day | ORAL | Status: DC
  Administered 2024-05-21: 25 mg via ORAL
  Filled 2024-05-19 (×2): qty 1

## 2024-05-19 MED ORDER — ACETAMINOPHEN 325 MG PO TABS: 650.0000 mg | ORAL_TABLET | ORAL | Status: DC | PRN

## 2024-05-19 MED ORDER — SENNOSIDES-DOCUSATE SODIUM 8.6-50 MG PO TABS: 1.0000 | ORAL_TABLET | Freq: Every evening | ORAL | Status: DC | PRN

## 2024-05-19 MED ORDER — CLEVIDIPINE BUTYRATE 0.5 MG/ML IV EMUL: 0.0000 mg/h | INTRAVENOUS | Status: DC

## 2024-05-19 MED ORDER — ACETAMINOPHEN 160 MG/5ML PO SOLN: 650.0000 mg | ORAL | Status: DC | PRN

## 2024-05-19 MED ORDER — SODIUM CHLORIDE 0.9 % IV BOLUS
1000.0000 mL | Freq: Once | INTRAVENOUS | Status: AC
  Administered 2024-05-19: 1000 mL via INTRAVENOUS

## 2024-05-19 MED ORDER — STROKE: EARLY STAGES OF RECOVERY BOOK
Freq: Once | Status: AC
  Filled 2024-05-19: qty 1

## 2024-05-19 MED ORDER — SODIUM CHLORIDE 0.9% FLUSH
3.0000 mL | Freq: Once | INTRAVENOUS | Status: AC
  Administered 2024-05-19: 3 mL via INTRAVENOUS

## 2024-05-19 MED ORDER — ACETAMINOPHEN 650 MG RE SUPP: 650.0000 mg | RECTAL | Status: DC | PRN

## 2024-05-19 MED ORDER — TENECTEPLASE 25 MG IV KIT
0.2500 mg/kg | PACK | Freq: Once | INTRAVENOUS | Status: AC
  Administered 2024-05-19: 20 mg via INTRAVENOUS
  Filled 2024-05-19: qty 5

## 2024-05-19 MED ORDER — INSULIN ASPART 100 UNIT/ML IJ SOLN
0.0000 [IU] | INTRAMUSCULAR | Status: DC
  Administered 2024-05-20: 3 [IU] via SUBCUTANEOUS
  Administered 2024-05-20 – 2024-05-21 (×5): 2 [IU] via SUBCUTANEOUS
  Filled 2024-05-19: qty 5
  Filled 2024-05-19 (×4): qty 2

## 2024-05-19 MED ORDER — IOHEXOL 350 MG/ML SOLN
75.0000 mL | Freq: Once | INTRAVENOUS | Status: AC | PRN
  Administered 2024-05-19: 75 mL via INTRAVENOUS

## 2024-05-19 MED ORDER — CHLORHEXIDINE GLUCONATE CLOTH 2 % EX PADS
6.0000 | MEDICATED_PAD | Freq: Every day | CUTANEOUS | Status: DC
  Administered 2024-05-20 – 2024-05-21 (×2): 6 via TOPICAL

## 2024-05-19 NOTE — Progress Notes (Signed)
 PHARMACIST CODE STROKE RESPONSE  Notified to mix TNK at 1406 by Dr. Michaela TNK preparation completed at 1407  TNK dose = 20 mg IV over 5 seconds.   Issues/delays encountered (if applicable):   Dorn Poot 05/19/2024 2:10 PM

## 2024-05-19 NOTE — ED Provider Notes (Signed)
 " [place] [facility] Provider Note   CSN: [id number] Arrival date & time: 05/19/24  1346  An emergency department physician performed an initial assessment on this suspected stroke patient at 1349.  Patient presents with: Code Stroke   Justin [name] is a 83 y.o. male.   Pt is a 83 yo male with pmhx significant for CVA, HTN, GERD, migraines, hiatal hernia, esophagitis, and spinocerebellar ataxia.  Pt has a caregiver and noted that slurred speech and left sided weakness started abruptly at 1235.  EMS was called quickly and a code stroke was called en route.  Pt was met at the bridge by myself and the code stroke team.         Prior to Admission medications  Medication Sig Start Date End Date Taking? Authorizing Provider  acetaminophen  (TYLENOL  8 HOUR ARTHRITIS PAIN) 650 MG CR tablet Take 1 tablet (650 mg total) by mouth 2 (two) times daily. Patient taking differently: Take 650-1,300 mg by mouth See admin instructions. Take 2 tablets (1300mg ) by mouth every morning and take 1 tablet (650mg ) with supper. 04/17/19  Yes Reed, Tiffany L, DO  amLODipine  (NORVASC ) 5 MG tablet TAKE 1 TABLET BY MOUTH EVERY DAY Patient taking differently: Take 5 mg by mouth at bedtime. 04/06/24  Yes Caro Harlene POUR, NP  atorvastatin  (LIPITOR) 10 MG tablet TAKE 1 TABLET BY MOUTH EVERY DAY 12/20/23  Yes Eubanks, [name] K, NP  Bioflavonoid Products (BIOFLEX PO) Take 1 tablet by mouth daily with supper.   Yes [provider]  chlorpheniramine (CHLOR-TRIMETON) 4 MG tablet Take 4 mg by mouth daily with supper.   Yes [provider]  Cholecalciferol (VITAMIN D3 PO) Take 1 capsule by mouth daily with supper.   Yes [provider]  clopidogrel  (PLAVIX ) 75 MG tablet Take 1 tablet (75 mg total) by mouth daily. 03/09/24 03/09/25 Yes Ghimire, Donalda HERO, MD  docusate sodium  (COLACE) 100 MG capsule Take 100 mg by mouth daily with supper.   Yes [provider]  hydrochlorothiazide  (HYDRODIURIL ) 12.5 MG tablet TAKE 1 TABLET BY MOUTH EVERY DAY Patient taking differently: Take 12.5 mg by mouth at bedtime. 12/20/23  Yes Caro Harlene POUR, NP  losartan  (COZAAR ) 100 MG tablet Take 1 tablet (100 mg total) by mouth daily. Patient taking differently: Take 100 mg by mouth at bedtime. 05/06/23  Yes Caro Harlene POUR, NP  mirabegron  ER (MYRBETRIQ ) 25 MG TB24 tablet Take 1 tablet (25 mg total) by mouth daily. 04/03/24  Yes Eubanks, [name] K, NP  Multiple Vitamins-Minerals (MULTIVITAMIN MEN 50+) TABS Take 1 tablet by mouth daily with supper.   Yes [provider]  pantoprazole  (PROTONIX ) 40 MG tablet TAKE 1 TABLET BY MOUTH EVERY DAY Patient taking differently: Take 40 mg by mouth at bedtime. 05/04/24  Yes Caro Harlene POUR, NP  potassium chloride  (KLOR-CON ) 10 MEQ tablet TAKE 1 TABLET BY MOUTH EVERY DAY Patient taking differently: Take 10 mEq by mouth at bedtime. 08/10/23  Yes Caro Harlene POUR, NP  pregabalin  (LYRICA ) 25 MG capsule TAKE 1 CAPSULE BY MOUTH THREE TIMES A DAY Patient taking differently: Take 25 mg by mouth See admin instructions. Take 1 capsule (25mg ) by mouth in the morning and at bedtime. May also take 1 capsule during the day as needed for neuropathy. Maximum of 3 capsules in 24 hours. 09/08/23  Yes Eubanks, [name] K, NP  Probiotic Product (PROBIOTIC DAILY PO) Take 1 capsule by mouth daily with supper.   Yes [provider]  propranolol  (INDERAL ) 40 MG tablet TAKE 1 TABLET BY MOUTH TWICE A DAY 12/20/23  Yes Eubanks, [name] K, NP    Allergies: Patient has no known allergies.    Review of Systems  Neurological:  Positive for speech difficulty and weakness.       Difficulty swallowing  All other systems reviewed and are negative.   Updated Vital Signs BP 138/74   Pulse 87   Temp 98 F (36.7 C) (Axillary)   Resp (!) 21   Wt 80.2 kg   SpO2 92%   BMI 26.88 kg/m   Physical Exam Vitals and nursing note  reviewed.  Constitutional:      Appearance: Normal appearance. He is obese.  HENT:     Head: Normocephalic and atraumatic.     Right Ear: External ear normal.     Left Ear: External ear normal.     Nose: Nose normal.     Mouth/Throat:     Mouth: Mucous membranes are moist.     Pharynx: Oropharynx is clear.  Eyes:     Conjunctiva/sclera: Conjunctivae normal.     Pupils: Pupils are equal, round, and reactive to light.  Cardiovascular:     Rate and Rhythm: Normal rate and regular rhythm.     Pulses: Normal pulses.     Heart sounds: Normal heart sounds.  Pulmonary:     Effort: Pulmonary effort is normal.     Breath sounds: Normal breath sounds.  Musculoskeletal:        General: Normal range of motion.     Cervical back: Normal range of motion and neck supple.  Skin:    General: Skin is warm.     Capillary Refill: Capillary refill takes less than 2 seconds.  Neurological:     Mental Status: He is alert and oriented to person, place, and time.     Comments: Left sided weakness Significant dysarthria Pt is having difficulty with secretions     (all labs ordered are listed, but only abnormal results are displayed) Labs Reviewed  CBC - Abnormal; Notable for the following components:      Result Value   WBC 13.3 (*)    All other components within normal limits  DIFFERENTIAL - Abnormal; Notable for the following components:   Lymphs Abs 5.4 (*)    Monocytes Absolute 1.2 (*)    All other components within normal limits  COMPREHENSIVE METABOLIC PANEL WITH GFR - Abnormal; Notable for the following components:   Glucose, Bld 146 (*)    Alkaline Phosphatase 142 (*)    All other components within normal limits  GLUCOSE, CAPILLARY - Abnormal; Notable for the following components:   Glucose-Capillary 141 (*)    All other components within normal limits  LIPID PANEL - Abnormal; Notable for the following components:   Triglycerides 297 (*)    HDL 30 (*)    VLDL 59 (*)    All other  components within normal limits  GLUCOSE, CAPILLARY - Abnormal; Notable for the following components:   Glucose-Capillary 124 (*)    All other components within normal limits  GLUCOSE, CAPILLARY - Abnormal; Notable for the following components:   Glucose-Capillary 116 (*)    All other components within normal limits  GLUCOSE, CAPILLARY - Abnormal; Notable for the following components:   Glucose-Capillary 118 (*)    All other components within normal limits  GLUCOSE, CAPILLARY - Abnormal; Notable for the following components:   Glucose-Capillary 146 (*)  All other components within normal limits  I-STAT CHEM 8, ED - Abnormal; Notable for the following components:   Glucose, Bld 148 (*)    All other components within normal limits  CBG MONITORING, ED - Abnormal; Notable for the following components:   Glucose-Capillary 144 (*)    All other components within normal limits  MRSA NEXT GEN BY PCR, NASAL  PROTIME-INR  APTT  ETHANOL    EKG: None  Radiology: CT ANGIO HEAD NECK W WO CM (CODE STROKE) Result Date: 05/19/2024 EXAM: CTA HEAD AND NECK WITHOUT AND WITH 05/19/2024 02:21:30 PM TECHNIQUE: CTA of the head and neck was performed without and with the administration of 75 mL of iohexol  (OMNIPAQUE ) 350 MG/ML injection. Multiplanar 2D and/or 3D reformatted images are provided for review. Automated exposure control, iterative reconstruction, and/or weight based adjustment of the mA/kV was utilized to reduce the radiation dose to as low as reasonably achievable. Stenosis of the internal carotid arteries measured using NASCET criteria. COMPARISON: CTA head and neck 03/07/2024 CLINICAL HISTORY: Neuro deficit, acute, stroke suspected. FINDINGS: CTA NECK: AORTIC ARCH AND ARCH VESSELS: No dissection or arterial injury. No significant stenosis of the brachiocephalic or subclavian arteries. CERVICAL CAROTID ARTERIES: No dissection, arterial injury, or hemodynamically significant stenosis by NASCET  criteria. CERVICAL VERTEBRAL ARTERIES: No dissection, arterial injury, or significant stenosis. LUNGS AND MEDIASTINUM: Biapical pleural calcifications. SOFT TISSUES: No acute abnormality. BONES: Fusion of the C2-C3-C4 vertebra. CTA HEAD: ANTERIOR CIRCULATION: No significant stenosis of the internal carotid arteries. No significant stenosis of the anterior cerebral arteries. No significant stenosis of the middle cerebral arteries. Unchanged 2.5 mm rounded/ bulbous configuration to an anterior cerebral artery branch point (series 6, image 70). 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 There is a right posterior communicating artery supplying the right posterior cerebral arteries, which are patent. There is slightly decreased caliber/attenuation of the left posterior cerebral artery branches without occlusive disease. No significant stenosis of the vertebral arteries. No aneurysm. OTHER: No dural venous sinus thrombosis on this non-dedicated study. IMPRESSION: 1. 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 2. This finding was communicated to Dr. Michaela via the [facility] text paging service at 2:40 PM. 3. Otherwise, no hemodynamically significant stenosis of the major cervical or intracranial arteries. 4. A 2.5 mm rounded appearance of an anterior cerebral artery branch point may represent confluence of vessels versus a small aneurysm. Electronically signed by: Prentice Spade MD 05/19/2024 03:32 PM EST RP Workstation: [id number]   CT HEAD CODE STROKE WO CONTRAST Result Date: 05/19/2024 EXAM: CT HEAD WITHOUT CONTRAST 05/19/2024 02:03:20 PM TECHNIQUE: CT of the head was performed without the administration of intravenous contrast. Automated exposure control, iterative reconstruction, and/or weight based adjustment of the mA/kV was utilized to reduce the radiation dose to as low as reasonably achievable.  COMPARISON: CT head 03/10/2024 and MRI brain 03/10/2024. CLINICAL HISTORY: Neuro deficit, acute, stroke suspected. FINDINGS: BRAIN AND VENTRICLES: No acute hemorrhage. No evidence of acute territorial infarct. No hydrocephalus. No extra-axial collection. No mass effect or midline shift. There is chronic evolution of a left pontine infarction. There is overall similar mild parenchymal volume loss. There is overall similar mild-to-moderate scattered white matter hypodensities which are nonspecific but most commonly represent chronic microvascular ischemic changes. Small chronic left occipital cortical infarction seen to greater advantage on prior MRI brain. Aspect score 10/10. ORBITS: Bilateral lens replacement. SINUSES: No acute abnormality. SOFT TISSUES  AND SKULL: No acute soft tissue abnormality. No skull fracture. IMPRESSION: 1. No acute intracranial hemorrhage or evidence of acute territorial infarction. ASPECTS score 10/10. 2. This finding was communicated to Dr. Michaela via the [facility] text paging service at 2:40 pm. Electronically signed by: Prentice Spade MD 05/19/2024 03:06 PM EST RP Workstation: [id number]     Procedures   Medications Ordered in the ED  acetaminophen  (TYLENOL ) tablet 650 mg (has no administration in time range)    Or  acetaminophen  (TYLENOL ) 160 MG/5ML solution 650 mg (has no administration in time range)    Or  acetaminophen  (TYLENOL ) suppository 650 mg (has no administration in time range)  senna-docusate (Senokot-S) tablet 1 tablet (has no administration in time range)  Chlorhexidine  Gluconate Cloth 2 % PADS 6 each (6 each Topical Given 05/20/24 0600)  labetalol  (NORMODYNE ) injection 10 mg (has no administration in time range)  clevidipine  (CLEVIPREX ) infusion 0.5 mg/mL ( Intravenous Not Given 05/19/24 1627)  mirabegron  ER (MYRBETRIQ ) tablet 25 mg (25 mg Oral Not Given 05/20/24 1050)  0.9 %  sodium chloride  infusion ( Intravenous Infusion Verify 05/20/24 0800)   insulin  aspart (novoLOG ) injection 0-15 Units (2 Units Subcutaneous Given 05/20/24 0821)  sodium chloride  flush (NS) 0.9 % injection 3 mL (3 mLs Intravenous Given 05/19/24 1627)  tenecteplase  (TNKASE ) injection for stroke 20 mg (20 mg Intravenous Given 05/19/24 1412)  iohexol  (OMNIPAQUE ) 350 MG/ML injection 75 mL (75 mLs Intravenous Contrast Given 05/19/24 1420)   stroke: early stages of recovery book ( Does not apply Given 05/20/24 1120)  sodium chloride  0.9 % bolus 1,000 mL (0 mLs Intravenous Stopped 05/19/24 2156)                                    Medical Decision Making Amount and/or Complexity of Data Reviewed Labs: ordered. Radiology: ordered.  Risk Prescription drug management. Decision regarding hospitalization.   This patient presents to the ED for concern of cva, this involves an extensive number of treatment options, and is a complaint that carries with it a high risk of complications and morbidity.  The differential diagnosis includes cva, seizure, electrolyte abn, infection   Co morbidities that complicate the patient evaluation  CVA, HTN, GERD, migraines, hiatal hernia, esophagitis, and spinocerebellar ataxia   Additional history obtained:  Additional history obtained from epic chart review External records from outside source obtained and reviewed including EMS Report   Lab Tests:  I Ordered, and personally interpreted labs.  The pertinent results include:  CBC with wbc elevated at 13.3; cmp nl, inr nl   Imaging Studies ordered:  I ordered imaging studies including ct head/ct angio  I independently visualized and interpreted imaging which showed  CT head: 1. No acute intracranial hemorrhage or evidence of acute territorial  infarction. ASPECTS score 10/10.  CT angio head/neck: . 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  2. This finding was communicated to Dr. Michaela via the [facility] text paging   service at 2:40 PM.  3. Otherwise, no hemodynamically significant stenosis of the major cervical or  intracranial arteries.  4. A 2.5 mm rounded appearance of an anterior cerebral artery branch point may  represent confluence of vessels versus a small aneurysm.   I agree with the radiologist interpretation   Cardiac Monitoring:  The patient was maintained on a cardiac monitor.  I personally viewed and interpreted the cardiac  monitored which showed an underlying rhythm of: nsr   Medicines ordered and prescription drug management:   I have reviewed the patients home medicines and have made adjustments as needed   Test Considered:  mri   Critical Interventions:  Code stroke   Consultations Obtained:  I requested consultation with the neurologist (Dr. Michaela),  and discussed lab and imaging findings as well as pertinent plan - he will admit   Problem List / ED Course:  CVA: unfortunately, pt has a basilar artery stroke with bulbar dysfunction.  He consented for TNK.  Pt was d/w the neuro IR physician and decision was made not to proceed with thrombectomy unless pt deteriorates.  Pt adm to the stroke team.  Reevaluation:  After the interventions noted above, I reevaluated the patient and found that they have :stayed the same   Social Determinants of Health:  Lives at home   Dispostion:  After consideration of the diagnostic results and the patients response to treatment, I feel that the patent would benefit from admission.  CRITICAL CARE Performed by: Mliss Boyers   Total critical care time: 30 minutes  Critical care time was exclusive of separately billable procedures and treating other patients.  Critical care was necessary to treat or prevent imminent or life-threatening deterioration.  Critical care was time spent personally by me on the following activities: development of treatment plan with patient and/or surrogate as well as nursing,  discussions with consultants, evaluation of patient's response to treatment, examination of patient, obtaining history from patient or surrogate, ordering and performing treatments and interventions, ordering and review of laboratory studies, ordering and review of radiographic studies, pulse oximetry and re-evaluation of patient's condition.        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

## 2024-05-19 NOTE — H&P (Signed)
 NEUROLOGY H&P NOTE   Date of service: May 19, 2024 Patient Name: Justin [name] MRN:  [id number] DOB:  [birth year] Chief Complaint: dysphagia  History of Present Illness  Justin [name] is a 83 y.o. male with hx of multiple previous strokes, hypertension, spinocerebellar ataxia type VI, who presents with slurred speech and left-sided weakness that started abruptly around 1235.  His caregiver states that he was normal this morning and the patient agrees with this.  Due to the symptoms EMS was called and he was brought in as a code stroke.  He was evaluated with CT, and given the severity of his bulbar dysfunction I discussed the risk, benefits, and alternatives of IV TNK with the patient.  I did emphasize that given his stroke 2 months ago, there was an increased risk of bleeding given that this cohort was not included in the original studies, but that I felt with a small size and 2 months elapsed, that the risk was likely tolerable in the setting of his symptoms.  He agreed with proceeding with this understanding.  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  Given that this is almost certainly an atheromatous occlusion, and his symptoms are on the milder side, after discussion with neuro IR is decided not to proceed with thrombectomy unless the patient were to markedly worsen.   Last known well: 1235 Modified rankin score: 4-Needs assistance to walk and tend to bodily needs IV Thrombolysis: Yes Thrombectomy: No, risks outweigh benefits   NIHSS components Score: Comment  1a Level of Conscious 0[x]  1[]  2[]  3[]      1b LOC Questions 0[x]  1[]  2[]       1c LOC Commands 0[x]  1[]  2[]       2 Best Gaze 0[x]  1[]  2[]       3 Visual 0[x]  1[]  2[]  3[]      4 Facial Palsy 0[]  1[]  2[x]  3[]      5a Motor Arm - left 0[]  1[]  2[x]  3[]  4[]  UN[]    5b Motor Arm - Right 0[x]  1[]  2[]  3[]  4[]  UN[]    6a Motor Leg - Left  0[]  1[x]  2[]  3[]  4[]  UN[]    6b Motor Leg - Right 0[x]  1[]  2[]  3[]  4[]  UN[]    7 Limb Ataxia 0[x]  1[]  2[]  UN[]      8 Sensory 0[x]  1[]  2[]  UN[]      9 Best Language 0[x]  1[]  2[]  3[]      10 Dysarthria 0[]  1[]  2[x]  UN[]      11 Extinct. 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   Diaphragmatic hernia without mention of obstruction or gangrene 1982   Hiatal hernia   Diplopia 2006   Disturbance of skin sensation 2003   Diverticulosis of colon (without mention of hemorrhage) 1997   Dizziness and giddiness 2001   Dysphagia, unspecified(787.20) 1999 and   Esophagitis, unspecified 1997   GERD (gastroesophageal reflux disease) 2003   Hypertension 2003   Impotence of organic origin 1999  Lack of coordination 09/07/2007   Lumbago 2003   Migraine with aura, without mention of intractable migraine without mention of status migrainosus 1997   Myalgia and myositis, unspecified 1999   Other and unspecified hyperlipidemia 2003   Other disorders of vitreous 2003   Other malaise and fatigue 2003   Other seborrheic keratosis 2013   Other seborrheic keratosis 04/13/2009   Pain in joint, site unspecified 04/13/2012   Personal history of fall 04/15/2011   Restless legs syndrome (RLS) 1997   Spinocerebellar disease, unspecified 05/01/2008   Stroke (HCC)    Unspecified hearing loss 2003   Unspecified tinnitus 04/15/2011   Past Surgical History:  Procedure Laterality Date   APPENDECTOMY  Age 55   boil removed     Dr. Ebbie   C3-4 anterior cervical surgery  1999   Dr. Carles    CARDIAC CATHETERIZATION  2001   Dr. Bulah   COLONOSCOPY  07/20/07   Dr. Jakie   EYE SURGERY Bilateral 2015   cataracts   LAMINOTOMY / EXCISION DISK POSTERIOR CERVICAL SPINE     Spinal Disk (?4-5)   LOOP RECORDER INSERTION N/A 03/13/2024   Procedure: LOOP RECORDER INSERTION;  Surgeon: Inocencio Soyla Lunger, MD;  Location: [facility];  Service: Cardiovascular;  Laterality: N/A;   Family History  Problem Relation Age of Onset   Heart disease Father        SCA with multiple family members with SCA   Diabetes Neg Hx    Colon cancer Neg Hx    Colon polyps Neg Hx    Esophageal cancer Neg Hx    Kidney disease Neg Hx    Gallbladder disease Neg Hx    Social History   Socioeconomic History   Marital status: Widowed    Spouse name: Not on file   Number of children: 2   Years of education: Not on file   Highest education level: Not on file  Occupational History   Occupation: Real Estate  Tobacco Use   Smoking status: Never   Smokeless tobacco: Former    Types: Chew    Quit date: 06/02/1979   Tobacco comments:    Stopped Chew about 1981  Vaping Use   Vaping status: Never Used  Substance and Sexual Activity   Alcohol use: Not Currently   Drug use: No   Sexual activity: Yes  Other Topics Concern   Not on file  Social History Narrative   Right handed   Drinks caffeine   One story home   Social Drivers of Health   Tobacco Use: Medium Risk (05/19/2024)   Patient History    Smoking Tobacco Use: Never    Smokeless Tobacco Use: Former    Passive Exposure: Not on Actuary Strain: Not on file  Food Insecurity: Patient Declined (03/10/2024)   Epic    Worried About Programme Researcher, Broadcasting/film/video in the Last Year: Patient declined    Barista in the Last Year: Patient declined  Transportation Needs: No Transportation Needs (03/10/2024)   Epic    Lack of Transportation (Medical): No    Lack of Transportation (Non-Medical): No  Physical Activity: Not on  file  Stress: Not on file  Social Connections: Moderately Isolated (03/10/2024)   Social Connection and Isolation Panel    Frequency of Communication with Friends and Family: Three times a week    Frequency of Social Gatherings with Friends and Family: Three times a week    Attends  Religious Services: Never    Active Member of Clubs or Organizations: No    Attends Banker Meetings: Never    Marital Status: Married  Depression (PHQ2-9): Low Risk (04/03/2024)   Depression (PHQ2-9)    PHQ-2 Score: 0  Alcohol Screen: Not on file  Housing: Unknown (03/10/2024)   Epic    Unable to Pay for Housing in the Last Year: Patient declined    Number of Times Moved in the Last Year: 0    Homeless in the Last Year: No  Utilities: Not At Risk (03/10/2024)   Epic    Threatened with loss of utilities: No  Health Literacy: Not on file   Allergies[1]  Medications  Current Medications[2]   Vitals   Vitals:   05/19/24 1515 05/19/24 1525 05/19/24 1545 05/19/24 1600  BP: (!) 150/86 (!) 141/77 136/82 124/64  Pulse: 75 75 69 73  Resp: 19 20 20 17   Temp:      TempSrc:      SpO2: 96% 98% 93% 95%  Weight:         Body mass index is 26.88 kg/m.  Physical Exam   Constitutional: Appears well-developed and well-nourished.   Neurologic Examination    Neuro: Mental Status: Patient is awake, alert, oriented to person, place, month, year, and situation. Patient is able to give a clear and coherent history. No signs of aphasia or neglect Cranial Nerves: II: Visual Fields are full. Pupils are equal, round, and reactive to light.   III,IV, VI: EOMI without ptosis or diploplia.  He does have saccadic smooth pursuit. V: Facial sensation is symmetric to temperature VII: Facial movement with severe facial weakness VIII: hearing is intact to voice X: Uvula elevates symmetrically Motor: He has significant (3/5) left arm >left leg(4/5) weakness, no drift on the right Sensory: Sensation  is symmetric to light touch and temperature in the arms and legs. Cerebellar: Consistent with weakness on the left, able to perform on the right       Labs   CBC:  Recent Labs  Lab 05/19/24 1347 05/19/24 1354  WBC 13.3*  --   NEUTROABS 6.5  --   HGB 15.0 15.3  HCT 45.0 45.0  MCV 90.9  --   PLT 263  --    Basic Metabolic Panel:  Lab Results  Component Value Date   NA 137 05/19/2024   K 4.2 05/19/2024   CO2 23 05/19/2024   GLUCOSE 148 (H) 05/19/2024   BUN 15 05/19/2024   CREATININE 1.20 05/19/2024   CALCIUM  10.3 05/19/2024   GFRNONAA >60 05/19/2024   GFRAA 79 04/19/2020   Lipid Panel:  Lab Results  Component Value Date   LDLCALC 44 03/08/2024   HgbA1c:  Lab Results  Component Value Date   HGBA1C 6.7 (H) 03/08/2024   Urine Drug Screen: No results found for: LABOPIA, COCAINSCRNUR, LABBENZ, AMPHETMU, THCU, LABBARB  Alcohol Level     Component Value Date/Time   [facility] <15 05/19/2024 1347   INR  Lab Results  Component Value Date   INR 0.9 05/19/2024   APTT  Lab Results  Component Value Date   APTT 28 05/19/2024     CT Head without contrast(Personally reviewed): Negative  CT angio Head and Neck with contrast(Personally reviewed): Basilar occlusion  Assessment   Justin [name] is a 83 y.o. male with a history of spinocerebellar ataxia, previous strokes who presents with severe dysarthria, dysphagia and left-sided weakness.  Though his symptoms are significant, and likely  disabling, they are on the milder side of what is expected from a basilar thrombosis and given that he has an atheromatous occlusion, there could be concern with making him worse if we were to intervene.  He is 2 months out from his previous stroke, and I think the risk of hemorrhage into that stroke is relatively low, but nonzero and I did discuss this with the patient prior to TNK and he had agreed with proceeding.  He will need to be monitored closely, and if he does  worsen then we may need to consider thrombectomy.  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   Secondary Diagnosis: Essential (primary) hypertension  Recommendations  - HgbA1c, fasting lipid panel - MRI of the brain without contrast - Frequent neuro checks - Echocardiogram - CTA head and neck - Prophylactic therapy-none for 24 hours - Risk factor modification - Telemetry monitoring - PT consult, OT consult, Speech consult - Stroke team to follow  ______________________________________________________________________  This patient is critically ill and at significant risk of neurological worsening, death and care requires constant monitoring of vital signs, hemodynamics,respiratory and cardiac monitoring, neurological assessment, discussion with family, other specialists and medical decision making of high complexity. I spent 50 minutes of neurocritical care time  in the care of  this patient. This was time spent independent of any time provided by nurse practitioner or PA.  Aisha Seals, MD [facility]   If 7pm- 7am, please page neurology on call as listed in AMION. 05/19/2024  7:34 PM   Risks, benefits and alternatives of IVT discussed with patient and/or family and they agreed. CTH personally reviewed prior to TNK administration     [1] No Known Allergies [2]  Current Facility-Administered Medications:    [START ON 05/20/2024]  stroke: early stages of recovery book, , Does not apply, Once, Judithe, Erin C, NP   0.9 %  sodium chloride  infusion, , Intravenous, Continuous, Judithe Longs C, NP, Last Rate: 40 mL/hr at 05/19/24 1626, New Bag at 05/19/24 1626   acetaminophen  (TYLENOL ) tablet 650 mg, 650 mg, Oral, Q4H PRN **OR** acetaminophen  (TYLENOL ) 160 MG/5ML solution 650 mg, 650 mg, Per Tube, Q4H PRN **OR** acetaminophen  (TYLENOL ) suppository 650 mg, 650 mg, Rectal, Q4H PRN, Lehner, Erin C, NP   [START ON  05/20/2024] Chlorhexidine  Gluconate Cloth 2 % PADS 6 each, 6 each, Topical, Q0600, Seals Aisha SQUIBB, MD   clevidipine  (CLEVIPREX ) infusion 0.5 mg/mL, 0-21 mg/hr, Intravenous, Continuous, Lehner, Erin C, NP   labetalol  (NORMODYNE ) injection 10 mg, 10 mg, Intravenous, Q2H PRN, Lehner, Erin C, NP   senna-docusate (Senokot-S) tablet 1 tablet, 1 tablet, Oral, QHS PRN, Lehner, Erin C, NP

## 2024-05-19 NOTE — Progress Notes (Signed)
 Remote Loop Recorder Transmission

## 2024-05-19 NOTE — ED Triage Notes (Signed)
 Pt arrived via [facility] from home as a code stroke. Per EMS, pt was at home with caregiver and she was making lunch. Caregiver heard the pt starting to have worsening garbled speech and pt had difficulty drinking water so she called EMS. Per EMS, pt was drooling a lot left sided facial droop and left arm and left leg weakness. Pt is A&Ox4. Pt unable to swallow oral secretions.

## 2024-05-19 NOTE — Progress Notes (Signed)
" °  Discussed Code Status with daughter, [name] Keel [phone]). She confirmed that patient should be a DNR.  This order has been entered and confirmed with her over the phone.   Rocky JAYSON Likes, DNP [facility]   "

## 2024-05-19 NOTE — Progress Notes (Addendum)
 Brief Neuro Note:  Patient has had a decline in his exam with increasing NIHSS to 15 from prior NIHSS of 7. NIHSS as below:  NIHSS components Score: Comment  1a Level of Conscious 0[x]  1[]  2[]  3[]      1b LOC Questions 0[x]  1[]  2[]       1c LOC Commands 0[x]  1[]  2[]       2 Best Gaze 0[x]  1[]  2[]       3 Visual 0[x]  1[]  2[]  3[]      4 Facial Palsy 0[]  1[]  2[x]  3[]      5a Motor Arm - left 0[]  1[]  2[]  3[x]  4[]  UN[]    5b Motor Arm - Right 0[x]  1[]  2[]  3[]  4[]  UN[]    6a Motor Leg - Left 0[]  1[]  2[]  3[x]  4[]  UN[]    6b Motor Leg - Right 0[x]  1[]  2[]  3[]  4[]  UN[]    7 Limb Ataxia 0[]  1[x]  2[]  3[]  UN[]     8 Sensory 0[x]  1[]  2[]  UN[]      9 Best Language 0[x]  1[]  2[]  3[]      10 Dysarthria 0[]  1[]  2[x]  UN[]      11 Extinct. and Inattention 0[x]  1[]  2[]       TOTAL: 11    IT seems that the most significant change for him has been worsening LUE and LLE weakness. Patient is right handed at baseline and I confirmed this with him. He can still operate TV remote with his R hand with 5/5 hand grip on right side.  At baseline, even prior to this stroke, patient had poor functional status with baseline mRS of 4. He is wheelchair dependent and needs help with all ADLs. He has a caretaker at home. This was confirmed with patient and his children over phone.  I had extensive discussion with patient's daughter and son over phone. The goal of any intervention is to aim for improvement in functional outcome. With his poor baseline functional status and essentially wheelchair dependence at baseline, even if he is weak in LUE and LLE, it would not have a as significant impact on his functional status. As such, it is hard to expect significant benefits to his functional status and justify putting him at risk of hemorrhage from intervention. Patient is also right handed at baseline and still is very functional on the right side.  I discussed above with patient and with Ms. [name] Roberson(daughter) and MR. [name] Arenas(son)  over phone. I also had discussion with them about considering comfort care at some point and avoiding aggressive medical and surgical interventions.  This patient is critically ill and at significant risk of neurological worsening, death and care requires constant monitoring of vital signs, hemodynamics,respiratory and cardiac monitoring, neurological assessment, discussion with family, other specialists and medical decision making of high complexity. I spent 40 minutes of neurocritical care time  in the care of  this patient. This was time spent independent of any time provided by nurse practitioner or PA.  Diann Bangerter [facility] 05/19/2024  8:19 PM   Lukisha Procida [facility]

## 2024-05-19 NOTE — Code Documentation (Signed)
 Stroke Response Nurse Documentation Code Documentation  Braylin Xu is a 83 y.o. male arriving to [facility]  via [facility] on 05/19/2024 with past medical hx of CVA, GERD, Migraines, esophagitis, spinocerebellary ataxia, HTN, PAD, DM,  . On clopidogrel  75 mg daily. Code stroke was activated by EMS.   Patient from home where he was LKW at 1235 and now complaining of left sided weakness, left sided facial droop, and slurred speech. Per EMS, the patients caregiver stated the patient was in his normal state of health at 1235 when she was making his lunch when she noticed he had an acute onset of left sided weakness, left facial droop, and slurred speech.   Stroke team at the bedside on patient arrival. Labs drawn and patient cleared for CT by Dr. Dean. Patient to CT with team. NIHSS 7, see documentation for details and code stroke times. Patient with left facial droop, left arm weakness, left leg weakness, and dysarthria  on exam. The following imaging was completed:  CT Head and CTA. Patient is a candidate for IV Thrombolytic due to disabling stroke deficits due to MD. Patient is not a candidate for IR due to risks outweighing benefits per MD.   Care Plan: VS/NIHSS q19min x2hrs, q11min x6hrs, then q1hr until the order changes; BP Goal <180/105; NPO until SLP evaluates patient due to possible aspiration of secretions en route.  Process Delays Noted: MD preferred CTA prior to TNK  Bedside handoff with ED RN Alana.    Annabella DELENA Bame  Stroke Response RN

## 2024-05-20 ENCOUNTER — Inpatient Hospital Stay (HOSPITAL_COMMUNITY)

## 2024-05-20 DIAGNOSIS — E785 Hyperlipidemia, unspecified: Secondary | ICD-10-CM

## 2024-05-20 DIAGNOSIS — E119 Type 2 diabetes mellitus without complications: Secondary | ICD-10-CM | POA: Diagnosis not present

## 2024-05-20 DIAGNOSIS — R29711 NIHSS score 11: Secondary | ICD-10-CM | POA: Diagnosis not present

## 2024-05-20 DIAGNOSIS — Z7902 Long term (current) use of antithrombotics/antiplatelets: Secondary | ICD-10-CM | POA: Diagnosis not present

## 2024-05-20 DIAGNOSIS — I6322 Cerebral infarction due to unspecified occlusion or stenosis of basilar arteries: Secondary | ICD-10-CM | POA: Diagnosis not present

## 2024-05-20 DIAGNOSIS — I69391 Dysphagia following cerebral infarction: Secondary | ICD-10-CM | POA: Diagnosis not present

## 2024-05-20 DIAGNOSIS — I10 Essential (primary) hypertension: Secondary | ICD-10-CM | POA: Diagnosis not present

## 2024-05-20 LAB — GLUCOSE, CAPILLARY
Glucose-Capillary: 116 mg/dL — ABNORMAL HIGH (ref 70–99)
Glucose-Capillary: 118 mg/dL — ABNORMAL HIGH (ref 70–99)
Glucose-Capillary: 142 mg/dL — ABNORMAL HIGH (ref 70–99)
Glucose-Capillary: 146 mg/dL — ABNORMAL HIGH (ref 70–99)
Glucose-Capillary: 154 mg/dL — ABNORMAL HIGH (ref 70–99)
Glucose-Capillary: 161 mg/dL — ABNORMAL HIGH (ref 70–99)

## 2024-05-20 LAB — LIPID PANEL
Cholesterol: 124 mg/dL (ref 0–200)
HDL: 30 mg/dL — ABNORMAL LOW
LDL Cholesterol: 34 mg/dL (ref 0–99)
Total CHOL/HDL Ratio: 4.1 ratio
Triglycerides: 297 mg/dL — ABNORMAL HIGH
VLDL: 59 mg/dL — ABNORMAL HIGH (ref 0–40)

## 2024-05-20 MED ORDER — ENOXAPARIN SODIUM 40 MG/0.4ML IJ SOSY
40.0000 mg | PREFILLED_SYRINGE | INTRAMUSCULAR | Status: DC
  Administered 2024-05-20: 40 mg via SUBCUTANEOUS
  Filled 2024-05-20: qty 0.4

## 2024-05-20 MED ORDER — ATORVASTATIN CALCIUM 10 MG PO TABS
10.0000 mg | ORAL_TABLET | Freq: Every day | ORAL | Status: DC
  Administered 2024-05-20 – 2024-05-21 (×2): 10 mg via ORAL
  Filled 2024-05-20 (×2): qty 1

## 2024-05-20 MED ORDER — CLOPIDOGREL BISULFATE 75 MG PO TABS
75.0000 mg | ORAL_TABLET | Freq: Every day | ORAL | Status: DC
  Administered 2024-05-21: 75 mg via ORAL
  Filled 2024-05-20: qty 1

## 2024-05-20 MED ORDER — ASPIRIN 81 MG PO CHEW
81.0000 mg | CHEWABLE_TABLET | Freq: Every day | ORAL | Status: DC
  Administered 2024-05-21: 81 mg via ORAL
  Filled 2024-05-20: qty 1

## 2024-05-20 NOTE — Evaluation (Signed)
 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   Urgency incontinence 04/01/2016   History of fall 08/21/2015   Fungal dermatosis 02/20/2015   SCC (squamous cell carcinoma), 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   Internal hemorrhoids without complication 08/06/2014   Arthritis 02/21/2014   Seborrheic keratosis 02/21/2014   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   Tinnitus 04/12/2013   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HCC) 10/16/2012   Impotence, 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   Essential hypertension    GERD (gastroesophageal reflux disease)    Dyslipidemia    Lumbago    Dysphagia    Past Medical History:  Past Medical History:  Diagnosis Date   Abnormality of gait 09/07/2007   Acute focal neurological deficit 02/21/2014   Allergic rhinitis, cause unspecified 2007   Arthritis    Ataxia    Cervicalgia 02/26/2006   Chest pain    Cramp of limb 10/21/2011   Degeneration of intervertebral disc, site unspecified 1998   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   Diaphragmatic hernia without mention of obstruction or gangrene 1982   Hiatal hernia   Diplopia 2006   Disturbance of skin sensation 2003   Diverticulosis of colon (without mention of hemorrhage) 1997   Dizziness and giddiness 2001   Dysphagia, unspecified(787.20) 1999 and   Esophagitis, unspecified 1997   GERD (gastroesophageal reflux  disease) 2003   Hypertension 2003   Impotence of organic origin 1999   Lack of coordination 09/07/2007   Lumbago 2003   Migraine with aura, without mention of intractable migraine without mention of status migrainosus 1997   Myalgia and myositis, unspecified 1999   Other and unspecified hyperlipidemia 2003   Other disorders of vitreous 2003   Other malaise and fatigue 2003   Other seborrheic keratosis 2013   Other seborrheic keratosis 04/13/2009   Pain in joint, site unspecified 04/13/2012   Personal history of fall 04/15/2011   Restless legs syndrome (RLS) 1997   Spinocerebellar disease, unspecified 05/01/2008   Stroke (HCC)    Unspecified hearing loss 2003   Unspecified tinnitus 04/15/2011   Past Surgical History:  Past Surgical History:  Procedure Laterality Date   APPENDECTOMY  Age 46   boil removed     Dr. Ebbie   C3-4 anterior cervical surgery  1999   Dr. Carles   CARDIAC CATHETERIZATION  2001   Dr. Bulah   COLONOSCOPY  07/20/07   Dr. Jakie   EYE SURGERY Bilateral 2015   cataracts   LAMINOTOMY / EXCISION DISK POSTERIOR CERVICAL SPINE     Spinal Disk (?4-5)   LOOP RECORDER INSERTION N/A 03/13/2024   Procedure: LOOP RECORDER INSERTION;  Surgeon: Inocencio Soyla Gladis, MD;  Location: [facility];  Service: Cardiovascular;  Laterality: N/A;   HPI:  Pt is a  83 y.o. male who presented with slurred speech and left-sided weakness. 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 Previous BSE (03/11/24) noted pt at baseline swallow function,  but considering x1 coughing episode with liquids and h/o ataxia, SLP planned x1 f/u after initation of reg diet/thin liquids with choosing of softer items. Pt failed yale per RN note on 12/19, Patient continuously coughing on secretions. SLP order placed per MD. PMH: multiple previous strokes, hypertension, spinocerebellar ataxia type VI. GERD.   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 At the word level, pt is less than 50% intelligible with noted difficulty in the areas of: articulatory precision, respiration and resonance (hypernasality). With training in over articulation, increased vocal volume and slow rate, pt with improved intelligibility at the word level. Pt also educated on use of gestures (pointing, thumbs up/down) as additional modes of communication to assist with wants/needs. He is oriented x4 and followed simple- complex/multistep commands. Simple language tasks provided and do not suspect any aphasia at this time, though intelligiblity of speech impacts ability to get a full and accurate picture of expressive langauge.      PLAN: Recommend SLP f/u for tx of motor speech deficits acutely in order to improve functional communication.    SLP Assessment  SLP Recommendation/Assessment: Patient needs continued Speech Language Pathology Services SLP Visit Diagnosis: Dysarthria and anarthria (R47.1)     Assistance Recommended at Discharge     Functional Status Assessment Patient has had a recent decline in their functional status and demonstrates the ability to make significant improvements in function in a reasonable and predictable amount of time.  Frequency and Duration min 2x/week  2 weeks      SLP Evaluation Cognition  Overall Cognitive Status: Within Functional Limits for tasks assessed Arousal/Alertness: Awake/alert Orientation Level: Oriented X4 Year: 2025 Month: December Day of Week: Correct Attention: Sustained Sustained Attention: Impaired Sustained Attention Impairment: Verbal complex;Functional basic Awareness: Appears intact       Comprehension  Auditory Comprehension Overall Auditory Comprehension: Appears within functional limits for tasks assessed Commands: Within Functional  Limits Conversation: Simple Visual Recognition/Discrimination Discrimination: Within Function Limits Reading Comprehension Reading Status: Not tested    Expression Expression Primary Mode of Expression: Verbal Verbal Expression Overall Verbal Expression: Appears within functional limits for tasks assessed Initiation: No impairment Automatic Speech: Name;Social Response Level of Generative/Spontaneous Verbalization: Phrase Naming: No impairment Pragmatics: No impairment Interfering Components: Speech intelligibility Written Expression Written Expression: Not tested   Oral / Motor  Oral Motor/Sensory Function Overall Oral Motor/Sensory Function: Moderate impairment Facial ROM: Reduced left;Suspected CN VII (facial) dysfunction Facial Symmetry: Abnormal symmetry left;Suspected CN VII (facial) dysfunction Facial Strength: Reduced left;Suspected CN VII (facial) dysfunction Facial Sensation: Reduced left;Suspected CN V (Trigeminal) dysfunction Lingual ROM: Within Functional Limits Lingual Symmetry: Within Functional Limits Lingual Strength: Reduced Motor Speech Overall Motor Speech: Impaired Respiration: Impaired Level of Impairment: Phrase Phonation: Breathy Resonance: Hypernasality Articulation: Impaired Level of Impairment: Word Intelligibility: Intelligibility reduced Word: 0-24% accurate Phrase: 0-24% accurate Sentence: 0-24% accurate Conversation: 0-24% accurate Motor Planning: Within functional limits Motor Speech Errors: Aware;Inconsistent Effective Techniques: Slow rate;Increased vocal intensity;Over-articulate             [name], MA, CCC-SLP Acute Rehabilitation Services Office Number: [phone]  [name] 05/20/2024, 11:02 AM

## 2024-05-20 NOTE — Progress Notes (Signed)
Inpatient Rehab Admissions Coordinator:   Per therapy recommendations, patient was screened for CIR candidacy by Clemens Catholic, MS, CCC-SLP. At this time, Pt. is not at a level to tolerate the intensity of CIR.  Pt. may have potential to progress to becoming a potential CIR candidate, so CIR admissions team will follow and monitor for progress and participation with therapies and place consult order if Pt. appears to be an appropriate candidate. Please contact me with any questions.   Clemens Catholic, [place], [place] Admissions Coordinator  [phone] ([place]) [phone] (office)

## 2024-05-20 NOTE — Evaluation (Signed)
 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   Diaphragmatic hernia without mention of obstruction or gangrene 1982   Hiatal hernia   Diplopia 2006   Disturbance of skin sensation 2003   Diverticulosis of colon (without mention of hemorrhage) 1997   Dizziness and giddiness 2001   Dysphagia, unspecified(787.20) 1999 and   Esophagitis, unspecified 1997   GERD (gastroesophageal reflux disease) 2003   Hypertension 2003   Impotence of organic origin 1999   Lack of coordination 09/07/2007   Lumbago 2003   Migraine with aura, without mention of intractable migraine without mention of status migrainosus 1997   Myalgia and myositis, unspecified 1999   Other and unspecified hyperlipidemia 2003   Other disorders of vitreous 2003   Other malaise and fatigue 2003   Other seborrheic keratosis 2013   Other seborrheic keratosis 04/13/2009   Pain in joint, site unspecified 04/13/2012   Personal history of fall 04/15/2011   Restless legs syndrome (RLS) 1997   Spinocerebellar disease, unspecified 05/01/2008   Stroke (HCC)    Unspecified hearing loss 2003   Unspecified tinnitus 04/15/2011   Past Surgical History:  Past Surgical History:  Procedure Laterality Date   APPENDECTOMY  Age 80   boil removed     Dr. Ebbie   C3-4 anterior cervical surgery  1999    Dr. Carles   CARDIAC CATHETERIZATION  2001   Dr. Bulah   COLONOSCOPY  07/20/07   Dr. Jakie   EYE SURGERY Bilateral 2015   cataracts   LAMINOTOMY / EXCISION DISK POSTERIOR CERVICAL SPINE     Spinal Disk (?4-5)   LOOP RECORDER INSERTION N/A 03/13/2024   Procedure: LOOP RECORDER INSERTION;  Surgeon: Inocencio Soyla Gladis, MD;  Location: [facility];  Service: Cardiovascular;  Laterality: N/A;   HPI:  Pt is a  83 y.o. male who presented with slurred speech and left-sided weakness. 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 Previous BSE (03/11/24) noted pt at baseline swallow function, but considering x1 coughing episode with liquids and h/o ataxia, SLP planned x1 f/u after initation of reg diet/thin liquids with choosing of softer items. Pt failed yale per RN note on 12/19, Patient continuously coughing on secretions. SLP order placed per MD. PMH: multiple previous strokes, hypertension, spinocerebellar ataxia type VI. GERD.    Assessment / Plan / Recommendation  Clinical Impression  Pt with s/sx of oropharyngeal dysphagia at bedside this am and moreso than at baseline (some coughing during meals per pt/previous SLP BSE- see HPI). Oral motor exam significant for L facial weakness, reduced ROM on L side and facial asymmetry affecting same side. Suspect some lingual/oral weakness. Coughing, throat clearing and mild wet vocal quality noted with thin liquids by single and consecutive straw sips. Volitional coughing was strong, but upon command to cough and clear wet  vocal quality, it was weak and ineffective. Prolonged manipulation of solids noted, with min oral residuals of graham cracker observed. Pt cleared with liquid wash and oral suction.      PLAN: Given h/o of previous CVAs, some baseline dysphagia and overt s/sx of aspiration exhibited this am, recommend remain NPO with free water protocol and will allow few small bites of  puree after oral care upon request. A Modified Barium Swallow Study (MBSS) recommended to further assess swallow physiology. Will complete as radiology schedule allows. Pt and RN in agreement with this plan.  SLP Visit Diagnosis: Dysphagia, unspecified (R13.10)    Aspiration Risk  Moderate aspiration risk    Diet Recommendation           Other Recommendations Oral Care Recommendations: Oral care QID;Oral care prior to ice chip/H20;Oral care before and after PO     Swallow Evaluation Recommendations Recommendations: Free water protocol after oral care (small bites puree from floor stock after oral care) Liquid Administration via: Cup;No straw Medication Administration: Crushed with puree Supervision: Staff to assist with self-feeding;Full supervision/cueing for swallowing strategies Swallowing strategies  : Minimize environmental distractions;Slow rate;Small bites/sips;Check for pocketing or oral holding Postural changes: Position pt fully upright for meals Oral care recommendations: Oral care QID (4x/day);Oral care before ice chips/water;Oral care before PO   Assistance Recommended at Discharge    Functional Status Assessment Patient has had a recent decline in their functional status and demonstrates the ability to make significant improvements in function in a reasonable and predictable amount of time.  Frequency and Duration min 2x/week  2 weeks       Prognosis Prognosis for improved oropharyngeal function: Fair Barriers to Reach Goals: Time post onset (baseline ataxia w/ some baseline dysphagia)      Swallow Study   General Date of Onset: 05/19/24 HPI: Pt is a  83 y.o. male who presented with slurred speech and left-sided weakness. 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 Previous BSE (03/11/24) noted pt at baseline swallow function, but considering x1 coughing episode with liquids and h/o ataxia, SLP planned x1 f/u  after initation of reg diet/thin liquids with choosing of softer items. Pt failed yale per RN note on 12/19, Patient continuously coughing on secretions. SLP order placed per MD. PMH: multiple previous strokes, hypertension, spinocerebellar ataxia type VI. GERD. Type of Study: Bedside Swallow Evaluation Previous Swallow Assessment: see HPI Diet Prior to this Study: NPO Temperature Spikes Noted: No Respiratory Status: Room air History of Recent Intubation: No Behavior/Cognition: Alert;Cooperative;Pleasant mood Oral Cavity Assessment: Within Functional Limits Oral Care Completed by SLP: No Oral Cavity - Dentition: Adequate natural dentition Vision: Functional for self-feeding Self-Feeding Abilities: Able to feed self;Needs assist Patient Positioning: Postural control interferes with function Baseline Vocal Quality: Breathy Volitional Cough: Weak Volitional Swallow: Able to elicit    Oral/Motor/Sensory Function Overall Oral Motor/Sensory Function: Moderate impairment Facial ROM: Reduced left;Suspected CN VII (facial) dysfunction Facial Symmetry: Abnormal symmetry left;Suspected CN VII (facial) dysfunction Facial Strength: Reduced left;Suspected CN VII (facial) dysfunction Facial Sensation: Reduced left;Suspected CN V (Trigeminal) dysfunction Lingual ROM: Within Functional Limits Lingual Symmetry: Within Functional Limits Lingual Strength: Reduced   Ice Chips Ice chips: Not tested   Thin Liquid Thin Liquid: Impaired Presentation: Cup;Straw;Self Fed Oral Phase Functional Implications: Prolonged oral transit;Oral holding Pharyngeal  Phase Impairments: Suspected delayed Swallow;Wet Vocal Quality;Throat Clearing - Immediate;Cough - Immediate    Nectar Thick Nectar Thick Liquid: Within functional limits  Presentation: Straw;Self Fed   Honey Thick Honey Thick Liquid: Not tested   Puree Puree: Impaired Presentation: Spoon Oral Phase Impairments: Impaired mastication;Reduced lingual  movement/coordination Oral Phase Functional Implications: Prolonged oral transit;Oral holding   Solid     Solid: Impaired Presentation: Self Fed Oral Phase Impairments: Impaired mastication;Reduced lingual movement/coordination Oral Phase Functional Implications: Oral residue;Impaired mastication;Prolonged oral transit       [name], MA, CCC-SLP Acute Rehabilitation Services Office Number: [phone]  [name] 05/20/2024,10:40 AM

## 2024-05-20 NOTE — Progress Notes (Signed)
 PT Cancellation Note  Patient Details Name: Justin [name] MRN: [id number] DOB: 1941-02-07   Cancelled Treatment:    Reason Eval/Treat Not Completed: Other (comment) (pt s/p TNK and await clarification of activity allowed at this time)   Jorgia Manthei B Jonerik Sliker 05/20/2024, 6:57 AM Lenoard SQUIBB, PT Acute Rehabilitation Services Office: [phone]

## 2024-05-20 NOTE — Progress Notes (Addendum)
 STROKE TEAM PROGRESS NOTE    SIGNIFICANT HOSPITAL EVENTS 12/19: Patient admitted with worsening slurred speech and left-sided weakness and given TNK to treat stroke.  INTERIM HISTORY/SUBJECTIVE  Patient has remained hemodynamically stable and afebrile overnight.  His exam did worsen, and decision was made not to attempt mechanical thrombectomy to basilar artery given his baseline functional status.  He continues to have severe dysarthria and left hemiplegia.  MRI reveals bilateral pontine strokes.  OBJECTIVE  CBC    Component Value Date/Time   WBC 13.3 (H) 05/19/2024 1347   RBC 4.95 05/19/2024 1347   HGB 15.3 05/19/2024 1354   HCT 45.0 05/19/2024 1354   PLT 263 05/19/2024 1347   MCV 90.9 05/19/2024 1347   MCH 30.3 05/19/2024 1347   MCHC 33.3 05/19/2024 1347   RDW 13.2 05/19/2024 1347   RDW 13.6 04/12/2013 1244   LYMPHSABS 5.4 (H) 05/19/2024 1347   LYMPHSABS 1.9 04/12/2013 1244   MONOABS 1.2 (H) 05/19/2024 1347   EOSABS 0.2 05/19/2024 1347   EOSABS 0.2 04/12/2013 1244   BASOSABS 0.1 05/19/2024 1347   BASOSABS 0.0 04/12/2013 1244    BMET    Component Value Date/Time   NA 137 05/19/2024 1354   NA 139 04/19/2020 1037   K 4.2 05/19/2024 1354   CL 104 05/19/2024 1354   CO2 23 05/19/2024 1347   GLUCOSE 148 (H) 05/19/2024 1354   BUN 15 05/19/2024 1354   BUN 19 04/19/2020 1037   CREATININE 1.20 05/19/2024 1354   CREATININE 0.94 06/09/2023 0909   CALCIUM  10.3 05/19/2024 1347   EGFR 81 06/09/2023 0909   GFRNONAA >60 05/19/2024 1347   GFRNONAA 73 10/04/2019 0949    IMAGING past 24 hours DG Swallowing Func-Speech Pathology Result Date: 05/20/2024 Table formatting from the original result was not included. Modified Barium Swallow Study Patient Details Name: Justin [name] MRN: [id number] Date of Birth: [birth year] Today's Date: 05/20/2024 HPI/PMH: HPI: Pt is a  83 y.o. male who presented with slurred speech and left-sided weakness. CT head- negative, CT angio Head and  Neck with contrast revealed Basilar occlusion,   MRI pending. Previous BSE (03/11/24) noted pt at baseline swallow function, but considering x1 coughing episode with liquids and h/o ataxia, SLP planned x1 f/u after initation of reg diet/thin liquids with choosing of softer items. Pt failed yale per RN note on 12/19, Patient continuously coughing on secretions. SLP order placed per MD. PMH: multiple previous strokes, hypertension, spinocerebellar ataxia type VI. GERD. Clinical Impression: Clinical Impression: Pt presents with oropharyngeal dysphagia likely related to baseline ataxia and new CN V and VII dysfunction. Aspiration of thin and nectar thick liquids was seen via fluoro with weak cough elicited from pt, which was ineffective to clear aspirate (PAS 7). While not all trials of NTL were aspirated (chin tuck mostly effective with small cup sips), what was aspirated was unable to be cleared, placing pt at risk for pulmonary complications. Pt's oral phase is slow and prolonged, often c/b oral holding and posterior and premature escape of bolus into pharyngeal structures. Bolus at times was left in vallecula for extended periods of time prior to swallow initation. This delay in initation, along with poor anterior hyoid excursion and incomplete laryngeal vestibule closure resulted in aspiration events. Poor BOT retraction, poor pharyngeal stripping and incomplete UES opening noted, impacting clearance of bolus. Collection of residue was noted on vallecula consistently across POs (including barium tablet). X1 instance of esophageal retention with retrograde flow through PES observed with thin liquid  bolus.   PLAN: Recommend dys 1 diet with honey-thick liquids,meds crushed in puree or may trial whole in puree if needed. Repeat dry/effortful swallows encouraged in order to assist with reduction of pharyngeal residuals. 1:1 supervision recommended during meals in order to assist and cue for strategies. SLP will f/u  for tx of dysphagia and assess diet tolerance. Factors that may increase risk of adverse event in presence of aspiration [name]): Factors that may increase risk of adverse event in presence of aspiration [name]): Poor general health and/or compromised immunity; Reduced cognitive function; Weak cough; Frail or deconditioned Recommendations/Plan: Swallowing Evaluation Recommendations Swallowing Evaluation Recommendations Recommendations: PO diet PO Diet Recommendation: Dysphagia 1 (Pureed); Moderately thick liquids (Level 3, honey thick) Liquid Administration via: Cup; No straw Medication Administration: Crushed with puree Supervision: Staff to assist with self-feeding; Full supervision/cueing for swallowing strategies Swallowing strategies  : Minimize environmental distractions; Slow rate; Small bites/sips; effortful swallow; Multiple dry swallows after each bite/sip; Follow solids with liquids Postural changes: Position pt fully upright for meals; Stay upright 30-60 min after meals Oral care recommendations: Oral care BID (2x/day) Treatment Plan Treatment Plan Treatment recommendations: Therapy as outlined in treatment plan below Follow-up recommendations: Follow physicians's recommendations for discharge plan and follow up therapies Functional status assessment: Patient has had a recent decline in their functional status and demonstrates the ability to make significant improvements in function in a reasonable and predictable amount of time. Treatment frequency: Min 2x/week Treatment duration: 2 weeks Interventions: Aspiration precaution training; Oropharyngeal exercises; Compensatory techniques; Patient/family education; Trials of upgraded texture/liquids; Diet toleration management by SLP Recommendations Recommendations for follow up therapy are one component of a multi-disciplinary discharge planning process, led by the attending physician.  Recommendations may be updated based on  patient status, additional functional criteria and insurance authorization. Assessment: Orofacial Exam: Orofacial Exam Oral Cavity: Oral Hygiene: WFL Oral Cavity - Dentition: Adequate natural dentition Orofacial Anatomy: WFL Oral Motor/Sensory Function: Suspected cranial nerve impairment CN V - Trigeminal: Left motor impairment; Left sensory impairment CN VII - Facial: Left motor impairment CN IX - Glossopharyngeal, CN X - Vagus: WFL CN XII - Hypoglossal: WFL Anatomy: Anatomy: WFL Boluses Administered: Boluses Administered Boluses Administered: Thin liquids (Level 0); Mildly thick liquids (Level 2, nectar thick); Moderately thick liquids (Level 3, honey thick); Puree; Solid  Oral Impairment Domain: Oral Impairment Domain Lip Closure: Escape beyond mid-chin Tongue control during bolus hold: Posterior escape of greater than half of bolus Bolus preparation/mastication: Slow prolonged chewing/mashing with complete recollection Bolus transport/lingual motion: Delayed initiation of tongue motion (oral holding) Oral residue: Trace residue lining oral structures Location of oral residue : Palate; Tongue Initiation of pharyngeal swallow : Valleculae  Pharyngeal Impairment Domain: Pharyngeal Impairment Domain Soft palate elevation: No bolus between soft palate (SP)/pharyngeal wall (PW) Laryngeal elevation: Complete superior movement of thyroid cartilage with complete approximation of arytenoids to epiglottic petiole Anterior hyoid excursion: Partial anterior movement Epiglottic movement: Complete inversion Laryngeal vestibule closure: Incomplete, narrow column air/contrast in laryngeal vestibule Pharyngeal stripping wave : Present - diminished Pharyngeal contraction (A/P view only): N/A Pharyngoesophageal segment opening: Partial distention/partial duration, partial obstruction of flow Tongue base retraction: Trace column of contrast or air between tongue base and PPW Pharyngeal residue: Collection of residue within or on  pharyngeal structures Location of pharyngeal residue: Valleculae; Pyriform sinuses  Esophageal Impairment Domain: Esophageal Impairment Domain Esophageal clearance upright position: Esophageal retention with retrograde flow through the PES Pill: Pill Consistency administered: Mildly thick liquids (Level  2, nectar thick) Mildly thick liquids (Level 2, nectar thick): WFL Penetration/Aspiration Scale Score: Penetration/Aspiration Scale Score 1.  Material does not enter airway: Moderately thick liquids (Level 3, honey thick); Puree; Solid; Pill 7.  Material enters airway, passes BELOW cords and not ejected out despite cough attempt by patient: Thin liquids (Level 0); Mildly thick liquids (Level 2, nectar thick) Compensatory Strategies: Compensatory Strategies Compensatory strategies: Yes Effortful swallow: Effective Effective Effortful Swallow: Puree Multiple swallows: Effective Effective Multiple Swallows: Puree; Mildly thick liquid (Level 2, nectar thick) Chin tuck: -- (somewhat effective,  minimally)   General Information: Caregiver present: Yes  Diet Prior to this Study: NPO (free water protocol, small bites puree PRN)   Temperature : Normal   Respiratory Status: WFL   Supplemental O2: None (Room air)   History of Recent Intubation: No  Behavior/Cognition: Alert; Cooperative; Pleasant mood Self-Feeding Abilities: Needs assist with self-feeding Baseline vocal quality/speech: Hypophonia/low volume Volitional Cough: Able to elicit Volitional Swallow: Able to elicit Exam Limitations: No limitations Goal Planning: Prognosis for improved oropharyngeal function: Fair Barriers to Reach Goals: Time post onset; Severity of deficits No data recorded Patient/Family Stated Goal: none stated Consulted and agree with results and recommendations: Patient; Nurse Pain: Pain Assessment Pain Assessment: No/denies pain Faces Pain Scale: 0 End of Session: Start Time:SLP Start Time (ACUTE ONLY): 1135 Stop Time: SLP Stop Time (ACUTE ONLY):  1155 Time Calculation:SLP Time Calculation (min) (ACUTE ONLY): 20 min Charges: SLP Evaluations $ SLP Speech Visit: 1 Visit SLP Evaluations $BSS Swallow: 1 Procedure $MBS Swallow: 1 Procedure $ SLP EVAL LANGUAGE/SOUND PRODUCTION: 1 Procedure SLP visit diagnosis: SLP Visit Diagnosis: Dysphagia, oropharyngeal phase (R13.12) Past Medical History: Past Medical History: Diagnosis Date  Abnormality of gait 09/07/2007  Acute focal neurological deficit 02/21/2014  Allergic rhinitis, cause unspecified 2007  Arthritis   Ataxia   Cervicalgia 02/26/2006  Chest pain   Cramp of limb 10/21/2011  Degeneration of intervertebral disc, site unspecified 1998  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  Diaphragmatic hernia without mention of obstruction or gangrene 1982  Hiatal hernia  Diplopia 2006  Disturbance of skin sensation 2003  Diverticulosis of colon (without mention of hemorrhage) 1997  Dizziness and giddiness 2001  Dysphagia, unspecified(787.20) 1999 and  Esophagitis, unspecified 1997  GERD (gastroesophageal reflux disease) 2003  Hypertension 2003  Impotence of organic origin 1999  Lack of coordination 09/07/2007  Lumbago 2003  Migraine with aura, without mention of intractable migraine without mention of status migrainosus 1997  Myalgia and myositis, unspecified 1999  Other and unspecified hyperlipidemia 2003  Other disorders of vitreous 2003  Other malaise and fatigue 2003  Other seborrheic keratosis 2013  Other seborrheic keratosis 04/13/2009  Pain in joint, site unspecified 04/13/2012  Personal history of fall 04/15/2011  Restless legs syndrome (RLS) 1997  Spinocerebellar disease, unspecified 05/01/2008  Stroke (HCC)   Unspecified hearing loss 2003  Unspecified tinnitus 04/15/2011 Past Surgical History: Past Surgical History: Procedure Laterality Date  APPENDECTOMY  Age 3  boil removed    Dr. Ebbie  C3-4 anterior cervical surgery  1999  Dr. Carles  CARDIAC CATHETERIZATION  2001  Dr.  Bulah  COLONOSCOPY  07/20/07  Dr. Jakie  EYE SURGERY Bilateral 2015  cataracts  LAMINOTOMY / EXCISION DISK POSTERIOR CERVICAL SPINE    Spinal Disk (?4-5)  LOOP RECORDER INSERTION N/A 03/13/2024  Procedure: LOOP RECORDER INSERTION;  Surgeon: Inocencio Soyla Lunger, MD;  Location: [facility];  Service: Cardiovascular;  Laterality: N/A; [name], MA, CCC-SLP Acute  Rehabilitation Services Office Number: [phone] [name] 05/20/2024, 1:59 PM   Vitals:   05/20/24 1000 05/20/24 1100 05/20/24 1200 05/20/24 1300  BP: (!) 144/71 139/68 138/74 (!) 159/66  Pulse: 82 84 87 89  Resp: (!) 22 15 (!) 21 (!) 21  Temp:   98.5 F (36.9 C)   TempSrc:      SpO2: 91% 92% 92% 91%  Weight:         PHYSICAL EXAM General:  Alert, well-nourished, well-developed patient in no acute distress Psych:  Mood and affect appropriate for situation CV: Regular rate and rhythm on monitor Respiratory:  Regular, unlabored respirations on room air   NEURO:  Mental Status: AA&Ox3 Speech/Language: speech is without patient with severe dysarthria  Cranial Nerves:  II: PERRL.  III, IV, VI: EOMI. Eyelids elevate symmetrically.  V: Sensation is intact to light touch and symmetrical to face.  VII: Left facial droop VIII: hearing intact to voice. IX, X: Voice is severely dysarthric XII: tongue is midline without fasciculations. Motor: Able to move right upper and lower extremities with antigravity strength, no movement of left upper and lower extremities Tone: is normal and bulk is normal Sensation- Intact to light touch bilaterally.  Gait- deferred  Most Recent NIH  1a Level of Conscious.: 0 1b LOC Questions: 0 1c LOC Commands: 0 2 Best Gaze: 0 3 Visual: 0 4 Facial Palsy: 1 5a Motor Arm - left: 4 5b Motor Arm - Right: 0 6a Motor Leg - Left: 4 6b Motor Leg - Right: 0 7 Limb Ataxia: 0 8 Sensory: 0 9 Best Language: 0 10 Dysarthria: 2 11 Extinct. and Inatten.: 0 TOTAL:  11   ASSESSMENT/PLAN  [name] is a 83 y.o. male with history of multiple previous strokes, hypertension, spinocerebellar ataxia type VI admitted for acute onset left-sided weakness and dysarthria.  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  He was given TNK to treat potential stroke, but mechanical thrombectomy was not done due to poor baseline functional status.  Overnight, left-sided weakness worsened.  MRI reveals bilateral pontine strokes.  Patient does have significant dysphagia as seen on modified barium swallow study.  He has reported that he does not wish to have a feeding tube.  Palliative care consulted for goals of care discussion with patient and family.  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 ASPECTS 10.    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 Therapy recommendations:  CIR Disposition: Pending  Hx of Stroke/TIA Patient has a history of left pontine and occipital infarcts in 10/25  Hypertension Home meds: Propranolol  40 mg twice daily, losartan  100 mg daily, hydrochlorothiazide  12.5 mg daily, amlodipine  5 mg daily Stable Blood Pressure Goal: BP less than 180/105   Hyperlipidemia Home meds: Atorvastatin  10 mg daily, resumed in hospital LDL 34, goal < 70 High intensity statin not indicated as LDL below goal Continue statin at discharge  Diabetes type II  Controlled Home meds: None HgbA1c  6.7, goal < 7.0 CBGs SSI Recommend close follow-up with PCP for better DM control  Dysphagia Patient has post-stroke dysphagia, SLP consulted    Diet   DIET - DYS 1 Room service appropriate? Yes; Fluid consistency: Honey Thick   Advance diet as tolerated  Other Stroke Risk Factors Advanced age   Other Active Problems Spinocerebellar ataxia type VI-PT/OT/SLP  Hospital day # 1  Patient seen by NP with MD, MD to addend note as needed. Amberlea Spagnuolo E Everitt Clint Kill , MSN, AGACNP-BC [facility] See Amion for schedule and pager information 05/20/2024 3:13 PM    To contact Stroke Continuity provider, please refer to [email or web address]. After hours, contact General Neurology

## 2024-05-20 NOTE — Evaluation (Signed)
 Physical Therapy Evaluation Patient Details Name: Justin [name] MRN: [id number] DOB: [birth year] Today's Date: 05/20/2024  History of Present Illness  83 yo M adm 05/19/24 with slurred speech and Lt weakness s/p TNK with Lt pons infarct. PMHx: HTN, HLD, DM, PAD, GERD, spinocerebellar ataxia, abdominal aortic dissection, CVA  Clinical Impression  Pt alert, pleasant and communicating with noted dysarthria. Pt oriented and able to follow commands on Rt side, eager to mobilize. Pt lives with paid caregiver with adjustable bed and DME and was walking with RW 70' at baseline. Pt with no noted activation of RUE this date with significant weakness LLE, impaired balance and function. Justin [name] will benefit from acute therapy to maximize mobility, safety and independence to decrease burden of care. Patient will benefit from intensive inpatient follow-up therapy, >3 hours/day to maximize pt function to decrease caregiver burden.   HR 87 BP 159/66 SPO2 91% on RA         If plan is discharge home, recommend the following: Two people to help with walking and/or transfers;A lot of help with bathing/dressing/bathroom;Assistance with feeding;Assistance with cooking/housework;Direct supervision/assist for financial management;Assist for transportation;Direct supervision/assist for medications management   Can travel by private vehicle        Equipment Recommendations [place] lift;Hospital bed  Recommendations for Other Services  Rehab consult;OT consult;Speech consult    Functional Status Assessment Patient has had a recent decline in their functional status and demonstrates the ability to make significant improvements in function in a reasonable and predictable amount of time.     Precautions / Restrictions Precautions Precautions: Fall      Mobility  Bed Mobility Overal bed mobility: Needs Assistance Bed Mobility: Rolling, Sidelying to Sit, Sit to Supine Rolling: Mod assist, Max  assist Sidelying to sit: Max assist       General bed mobility comments: mod assist to roll to left, max assist to roll Rt with pad. Max assist to clear LLE off surface and elevate trunk from surface. mod assist to control trunk and lift LLE to return to bed. Max assist in trendelenburg to slide to [facility] with pt assisting with RUE on rail and pushing with RLE    Transfers                   General transfer comment: will require +2 assist    Ambulation/Gait                  Stairs            Wheelchair Mobility     Tilt Bed    Modified Rankin (Stroke Patients Only) Modified Rankin (Stroke Patients Only) Pre-Morbid Rankin Score: Moderately severe disability Modified Rankin: Severe disability     Balance Overall balance assessment: Needs assistance Sitting-balance support: Single extremity supported Sitting balance-Leahy Scale: Poor Sitting balance - Comments: pt progressed from mod A to CGA (able to maintain 1 min with single UE support)                                     Pertinent Vitals/Pain Pain Assessment Pain Assessment: No/denies pain    Home Living Family/patient expects to be discharged to:: Private residence Living Arrangements: Other (Comment) Available Help at Discharge: Personal care attendant;Available 24 hours/day Type of Home: House Home Access: Ramped entrance       Home Layout: One level Home Equipment: Agricultural Consultant (2 wheels);Wheelchair - manual;Grab  bars - toilet;Grab bars - tub/shower;Hand held shower head;Shower seat;Other (comment) Additional Comments: adjustable bed, standup walker    Prior Function Prior Level of Function : Needs assist             Mobility Comments: walks with RW grossly 26' with CGA, transfers on his own ADLs Comments: dressing himself, assist for bathing and IADLs, self-feeding, urinal     Extremity/Trunk Assessment   Upper Extremity Assessment Upper Extremity Assessment:  LUE deficits/detail LUE Deficits / Details: no noted activation, PROM WFL    Lower Extremity Assessment Lower Extremity Assessment: LLE deficits/detail LLE Deficits / Details: 2-/5 knee flexion and extension, 1/5 hip flexion    Cervical / Trunk Assessment Cervical / Trunk Assessment: Kyphotic  Communication   Communication Communication: Impaired Factors Affecting Communication: Difficulty expressing self;Reduced clarity of speech    Cognition Arousal: Alert Behavior During Therapy: WFL for tasks assessed/performed   PT - Cognitive impairments: Difficult to assess Difficult to assess due to: Impaired communication                       Following commands: Intact       Cueing Cueing Techniques: Verbal cues, Gestural cues     General Comments      Exercises     Assessment/Plan    PT Assessment Patient needs continued PT services  PT Problem List Decreased strength;Decreased coordination;Decreased activity tolerance;Decreased balance;Decreased mobility       PT Treatment Interventions DME instruction;Therapeutic exercise;Balance training;Neuromuscular re-education;Functional mobility training;Therapeutic activities;Gait training;Patient/family education    PT Goals (Current goals can be found in the Care Plan section)  Acute Rehab PT Goals Patient Stated Goal: return home PT Goal Formulation: With patient/family Time For Goal Achievement: 06/03/24 Potential to Achieve Goals: Good    Frequency Min 3X/week     Co-evaluation               AM-PAC PT 6 Clicks Mobility  Outcome Measure Help needed turning from your back to your side while in a flat bed without using bedrails?: A Lot Help needed moving from lying on your back to sitting on the side of a flat bed without using bedrails?: A Lot Help needed moving to and from a bed to a chair (including a wheelchair)?: Total Help needed standing up from a chair using your arms (e.g., wheelchair or  bedside chair)?: Total Help needed to walk in hospital room?: Total Help needed climbing 3-5 steps with a railing? : Total 6 Click Score: 8    End of Session   Activity Tolerance: Patient tolerated treatment well Patient left: in bed;with call bell/phone within reach;with family/visitor present Nurse Communication: Mobility status PT Visit Diagnosis: Other abnormalities of gait and mobility (R26.89);Difficulty in walking, not elsewhere classified (R26.2);Muscle weakness (generalized) (M62.81);Hemiplegia and hemiparesis;Other symptoms and signs involving the nervous system (R29.898) Hemiplegia - Right/Left: Left Hemiplegia - dominant/non-dominant: Non-dominant Hemiplegia - caused by: Cerebral infarction    Time: 8698-8674 PT Time Calculation (min) (ACUTE ONLY): 24 min   Charges:   PT Evaluation $PT Eval Moderate Complexity: 1 Mod PT Treatments $Therapeutic Activity: 8-22 mins PT General Charges $$ ACUTE PT VISIT: 1 Visit         Lenoard SQUIBB, PT Acute Rehabilitation Services Office: [phone]   Lenoard NOVAK Sergey Ishler 05/20/2024, 1:55 PM

## 2024-05-20 NOTE — Progress Notes (Signed)
 Modified Barium Swallow Study  Patient Details  Name: Justin [name] MRN: [id number] Date of Birth: [birth year]  Today's Date: 05/20/2024  Modified Barium Swallow completed.  Full report located under Chart Review in the Imaging Section.  History of Present Illness Pt is a  83 y.o. male who presented with slurred speech and left-sided weakness. 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 Previous BSE (03/11/24) noted pt at baseline swallow function, but considering x1 coughing episode with liquids and h/o ataxia, SLP planned x1 f/u after initation of reg diet/thin liquids with choosing of softer items. Pt failed yale per RN note on 12/19, Patient continuously coughing on secretions. SLP order placed per MD. PMH: multiple previous strokes, hypertension, spinocerebellar ataxia type VI. GERD.   Clinical Impression Pt presents with oropharyngeal dysphagia likely related to baseline ataxia and new CN V and VII dysfunction. Aspiration of thin and nectar thick liquids was seen via fluoro with weak cough elicited from pt, which was ineffective to clear aspirate (PAS 7). While not all trials of NTL were aspirated (chin tuck mostly effective with small cup sips), what was aspirated was unable to be cleared, placing pt at risk for pulmonary complications. Pt's oral phase is slow and prolonged, often c/b oral holding and posterior and premature escape of bolus into pharyngeal structures. Bolus at times was left in vallecula for extended periods of time prior to swallow initation. This delay in initation, along with poor anterior hyoid excursion and incomplete laryngeal vestibule closure resulted in aspiration events. Poor BOT retraction, poor pharyngeal stripping and incomplete UES opening noted, impacting clearance of bolus. Collection of residue was noted on vallecula consistently across POs (including barium tablet). X1 instance of esophageal  retention with retrograde flow through PES observed with thin liquid bolus.     PLAN: Recommend dys 1 diet with honey-thick liquids,meds crushed in puree or may trial whole in puree if needed. Repeat dry/effortful swallows encouraged in order to assist with reduction of pharyngeal residuals. 1:1 supervision recommended during meals in order to assist and cue for strategies. SLP will f/u for tx of dysphagia and assess diet tolerance.  Factors that may increase risk of adverse event in presence of aspiration Justin [name]): Poor general health and/or compromised immunity;Reduced cognitive function;Weak cough;Frail or deconditioned  Swallow Evaluation Recommendations Recommendations: PO diet PO Diet Recommendation: Dysphagia 1 (Pureed);Moderately thick liquids (Level 3, honey thick) Liquid Administration via: Cup;No straw Medication Administration: Crushed with puree Supervision: Staff to assist with self-feeding;Full supervision/cueing for swallowing strategies Swallowing strategies  : Minimize environmental distractions;Slow rate;Small bites/sips;effortful swallow;Multiple dry swallows after each bite/sip;Follow solids with liquids Postural changes: Position pt fully upright for meals;Stay upright 30-60 min after meals Oral care recommendations: Oral care BID (2x/day)       Justin Kin, MA, CCC-SLP Acute Rehabilitation Services Office Number: [phone]  Justin [name] 05/20/2024,1:53 PM

## 2024-05-20 NOTE — Progress Notes (Signed)
 OT Cancellation Note  Patient Details Name: Justin [name] MRN: [id number] DOB: July 17, 1940   Cancelled Treatment:    Reason Eval/Treat Not Completed: Medical issues which prohibited therapy Patient is status post TNK administration at 1406 on 05/19/24. Per acute rehab protocol, OT is to hold evaluation for 24 hours post TNK administration unless otherwise cleared by an MD. OT will hold evaluation until medically appropriate.   Ronal Gift E. Shahrukh Pasch, OTR/L Acute Rehabilitation Services [phone]   Ronal Gift Salt 05/20/2024, 6:52 AM

## 2024-05-21 DIAGNOSIS — E785 Hyperlipidemia, unspecified: Secondary | ICD-10-CM | POA: Diagnosis not present

## 2024-05-21 DIAGNOSIS — I10 Essential (primary) hypertension: Secondary | ICD-10-CM | POA: Diagnosis not present

## 2024-05-21 DIAGNOSIS — R29711 NIHSS score 11: Secondary | ICD-10-CM | POA: Diagnosis not present

## 2024-05-21 DIAGNOSIS — I6322 Cerebral infarction due to unspecified occlusion or stenosis of basilar arteries: Secondary | ICD-10-CM | POA: Diagnosis not present

## 2024-05-21 LAB — BASIC METABOLIC PANEL WITH GFR
Anion gap: 11 (ref 5–15)
BUN: 9 mg/dL (ref 8–23)
CO2: 21 mmol/L — ABNORMAL LOW (ref 22–32)
Calcium: 9.1 mg/dL (ref 8.9–10.3)
Chloride: 106 mmol/L (ref 98–111)
Creatinine, Ser: 0.86 mg/dL (ref 0.61–1.24)
GFR, Estimated: >60 mL/min
Glucose, Bld: 136 mg/dL — ABNORMAL HIGH (ref 70–99)
Potassium: 3.7 mmol/L (ref 3.5–5.1)
Sodium: 139 mmol/L (ref 135–145)

## 2024-05-21 LAB — CBC
HCT: 38.4 % — ABNORMAL LOW (ref 39.0–52.0)
Hemoglobin: 13.3 g/dL (ref 13.0–17.0)
MCH: 30.6 pg (ref 26.0–34.0)
MCHC: 34.6 g/dL (ref 30.0–36.0)
MCV: 88.3 fL (ref 80.0–100.0)
Platelets: 201 K/uL (ref 150–400)
RBC: 4.35 MIL/uL (ref 4.22–5.81)
RDW: 13.2 % (ref 11.5–15.5)
WBC: 11.9 K/uL — ABNORMAL HIGH (ref 4.0–10.5)
nRBC: 0 % (ref 0.0–0.2)

## 2024-05-21 LAB — GLUCOSE, CAPILLARY
Glucose-Capillary: 138 mg/dL — ABNORMAL HIGH (ref 70–99)
Glucose-Capillary: 129 mg/dL — ABNORMAL HIGH (ref 70–99)

## 2024-05-21 MED ORDER — TRAZODONE HCL 50 MG PO TABS: 25.0000 mg | ORAL_TABLET | Freq: Every evening | ORAL | Status: DC | PRN

## 2024-05-21 MED ORDER — OXYCODONE HCL 20 MG/ML PO CONC: 5.0000 mg | ORAL | Status: DC | PRN

## 2024-05-21 MED ORDER — HALOPERIDOL LACTATE 5 MG/ML IJ SOLN: 0.5000 mg | INTRAMUSCULAR | Status: DC | PRN

## 2024-05-21 MED ORDER — LORAZEPAM 2 MG/ML PO CONC: 1.0000 mg | ORAL | Status: DC | PRN

## 2024-05-21 MED ORDER — BISACODYL 10 MG RE SUPP: 10.0000 mg | Freq: Every day | RECTAL | Status: DC | PRN

## 2024-05-21 MED ORDER — LORAZEPAM 2 MG/ML IJ SOLN
1.0000 mg | INTRAMUSCULAR | Status: DC | PRN
  Administered 2024-05-21: 1 mg via INTRAVENOUS
  Filled 2024-05-21: qty 1

## 2024-05-21 MED ORDER — HALOPERIDOL LACTATE 2 MG/ML PO CONC: 0.5000 mg | ORAL | Status: DC | PRN

## 2024-05-21 MED ORDER — HALOPERIDOL 0.5 MG PO TABS: 0.5000 mg | ORAL_TABLET | ORAL | Status: DC | PRN

## 2024-05-21 MED ORDER — DIPHENHYDRAMINE HCL 50 MG/ML IJ SOLN: 12.5000 mg | INTRAMUSCULAR | Status: DC | PRN

## 2024-05-21 MED ORDER — ONDANSETRON 4 MG PO TBDP: 4.0000 mg | ORAL_TABLET | Freq: Four times a day (QID) | ORAL | Status: DC | PRN

## 2024-05-21 MED ORDER — LORAZEPAM 1 MG PO TABS: 1.0000 mg | ORAL_TABLET | ORAL | Status: DC | PRN

## 2024-05-21 MED ORDER — IPRATROPIUM-ALBUTEROL 0.5-2.5 (3) MG/3ML IN SOLN: 3.0000 mL | Freq: Four times a day (QID) | RESPIRATORY_TRACT | Status: DC | PRN

## 2024-05-21 MED ORDER — MORPHINE SULFATE (PF) 2 MG/ML IV SOLN
1.0000 mg | INTRAVENOUS | Status: DC | PRN
  Administered 2024-05-22 (×2): 2 mg via INTRAVENOUS
  Filled 2024-05-21 (×2): qty 1

## 2024-05-21 MED ORDER — IPRATROPIUM-ALBUTEROL 0.5-2.5 (3) MG/3ML IN SOLN
3.0000 mL | Freq: Four times a day (QID) | RESPIRATORY_TRACT | Status: DC
  Administered 2024-05-21: 3 mL via RESPIRATORY_TRACT
  Filled 2024-05-21: qty 3

## 2024-05-21 MED ORDER — ONDANSETRON HCL 4 MG/2ML IJ SOLN: 4.0000 mg | Freq: Four times a day (QID) | INTRAMUSCULAR | Status: DC | PRN

## 2024-05-21 MED ORDER — BIOTENE DRY MOUTH MT LIQD: 15.0000 mL | OROMUCOSAL | Status: DC | PRN

## 2024-05-21 MED ORDER — ATROPINE SULFATE 1 % OP SOLN
4.0000 [drp] | OPHTHALMIC | Status: DC | PRN
  Administered 2024-05-21 – 2024-05-22 (×3): 4 [drp] via SUBLINGUAL
  Filled 2024-05-21: qty 2

## 2024-05-21 NOTE — TOC Progression Note (Addendum)
 Transition of Care [facility]) - Progression Note    Patient Details  Name: Justin [name] MRN: [id number] Date of Birth: Jan 04, 1941  Transition of Care [facility]) CM/SW Contact  Gwenn Frieze [place], [place] Phone Number: 05/21/2024, 11:06 AM  Clinical Narrative: Per Palliative Care NP Borders, pt's family requesting to explore hospice options at home vs hospice home placement. Spoke to pt's son and dtr re provider choice and they are requesting [facility]) due to location of hospice home option in [place]. Referral made to [facility] with [facility].   1415: Per Nat with [facility], pt does not currently meet criteria for [facility] and family is requesting [facility]) evaluate as well. Referral made to [facility] with HOP.    Frieze Gwenn, MSW, LCSW [phone] (coverage)                        Expected Discharge Plan and Services                                               Social Drivers of Health (SDOH) Interventions SDOH Screenings   Food Insecurity: Patient Declined (03/10/2024)  Housing: Unknown (03/10/2024)  Transportation Needs: No Transportation Needs (03/10/2024)  Utilities: Not At Risk (03/10/2024)  Depression (PHQ2-9): Low Risk (04/03/2024)  Social Connections: Moderately Isolated (03/10/2024)  Tobacco Use: Medium Risk (05/19/2024)    Readmission Risk Interventions     No data to display

## 2024-05-21 NOTE — Consult Note (Signed)
" °  Daily Progress Note   Patient Name: Justin [name]  DOB: 03/21/1941  MRN: [id number]  Age / Sex: 83 y.o., male   PCP: Justin Harlene POUR, NP Referring Physician: Stroke, Md, MD Admit Date: 05/19/2024 Length of Stay: 2 days  Reason for Consultation/Follow-up: Establishing goals of care and Hospice Evaluation   Primary Diagnoses  Present on Admission:  Stroke [facility])   CODE STATUS: DNR  Subjective:  Chief Complaint/History of Present Illness: Justin [name] is a 83 y.o. male with multiple medical problems including PAD, history of previous strokes, hypertension, spinocerebellar ataxia type VI, who was last hospitalized in October 2025 with CVA, and is now admitted 05/19/2024 with same.  Patient was found to have basilar occlusion on CTA and was given TNK.  Hospitalization has been complicated by progressive left-sided weakness, dysphagia, and now with probable aspiration.  MRI reveals bilateral pontine strokes.  Palliative care consulted to address goals.  Social hx: Patient is a widower.  Lives at home with caregiver.  Patient has a daughter in [place] and a son in [place] .  Patient previously worked as a engineer, civil (consulting).  Review of Systems  Unable to perform ROS   Objective:   Vital Signs:  BP 135/62   Pulse 88   Temp 99.7 F (37.6 C) (Axillary)   Resp (!) 26   Wt 176 lb 12.9 oz (80.2 kg)   SpO2 94%   BMI 26.88 kg/m   Physical Exam: General: frail appearing, alert Respiratory: unlabored, congested Skin: no rashes or lesions on visible skin Neuro: L. Sided weakness, aphasia   Imaging: @IMAGES @  Assessment & Plan:   Assessment: Patient is an 83 year old with history of multiple previous strokes, now admitted with CVA and MRI confirming bilateral pontine strokes.  Patient has had progressive worsening of left-sided weakness, dysphagia, now with probable aspiration.  This morning, patient and family met with stroke team.  Unfortunately,  overnight patient has had signs of aspiration.  Family have opted to transition to comfort care and pursue hospice involvement.  I spoke with patient's son, Justin [name].  Justin [name] confirms plan for comfort measures.  We discussed option of hospice facility for end-of-life care.  Justin [name] says he is awaiting arrival of his sister who is driving in from [place].  Family are interested in hospice facility.  Recommendations/Plan: # Complex medical decision making/goals of care:  - Patient being transition to comfort care.  Family interested in hospice IPU.  Spoke with TOC and hospice liaison to coordinate possible transfer to [facility].  -  Code Status: Limited: Do not attempt resuscitation (DNR) -DNR-LIMITED -Do Not Intubate/DNI   Prognosis: < 2 weeks  # Symptom management: Patient is receiving these palliative interventions for symptom management with an intent to improve quality of life.   -continue comfort measures  # Psychosocial Support:  -chaplain consult  # Discharge Planning: Hospice facility  Discussed with: Neuro attending  Thank you for allowing the palliative care team to participate in the care [facility].  Time: 30 minutes  Fonda Mower, PhD, NP-C Palliative Care Provider PMT # [phone]  If patient remains symptomatic despite maximum doses, please call PMT at [phone] between 0700 and 1900. Outside of these hours, please call attending, as PMT does not have night coverage.  "

## 2024-05-21 NOTE — Progress Notes (Addendum)
 [facility] [phone] [facility] Liaison Note   Received request for family interest in [facility]. Visited patient and spoke with patient and family at bedside to discuss services and hospice philosophy of care.    Chart reviewed and at this time patient does not meet criteria for hospice inpatient unit.    He is appropriate for hospice services in the home or LTC facility and we would be happy to reassess for inpatient unit appropriateness at a later time should patient have a change in condition.    Thank you for the opportunity to participate in this patient's care.    Nat Babe, BSN, [facility] Liaison [phone]

## 2024-05-21 NOTE — Progress Notes (Addendum)
 STROKE TEAM PROGRESS NOTE    SIGNIFICANT HOSPITAL EVENTS 12/19: Patient admitted with worsening slurred speech and left-sided weakness and given TNK to treat stroke. 12/21: Patient demonstrating signs of aspiration, goals of care discussion held with patient and family and decision made to transition patient to comfort care  INTERIM HISTORY/SUBJECTIVE  Patient has had some difficulty swallowing his food this morning and seems to be exhibiting signs of aspiration with low-grade fever.  Patient is adamant that he does not want any type of feeding tube or intubation.  Extensive goals of care discussion was held with patient and family, and patient decided to transition to comfort care with possible discharge to inpatient hospice or home with hospice support.  OBJECTIVE  CBC    Component Value Date/Time   WBC 11.9 (H) 05/21/2024 0432   RBC 4.35 05/21/2024 0432   HGB 13.3 05/21/2024 0432   HCT 38.4 (L) 05/21/2024 0432   PLT 201 05/21/2024 0432   MCV 88.3 05/21/2024 0432   MCH 30.6 05/21/2024 0432   MCHC 34.6 05/21/2024 0432   RDW 13.2 05/21/2024 0432   RDW 13.6 04/12/2013 1244   LYMPHSABS 5.4 (H) 05/19/2024 1347   LYMPHSABS 1.9 04/12/2013 1244   MONOABS 1.2 (H) 05/19/2024 1347   EOSABS 0.2 05/19/2024 1347   EOSABS 0.2 04/12/2013 1244   BASOSABS 0.1 05/19/2024 1347   BASOSABS 0.0 04/12/2013 1244    BMET    Component Value Date/Time   NA 139 05/21/2024 0432   NA 139 04/19/2020 1037   K 3.7 05/21/2024 0432   CL 106 05/21/2024 0432   CO2 21 (L) 05/21/2024 0432   GLUCOSE 136 (H) 05/21/2024 0432   BUN 9 05/21/2024 0432   BUN 19 04/19/2020 1037   CREATININE 0.86 05/21/2024 0432   CREATININE 0.94 06/09/2023 0909   CALCIUM  9.1 05/21/2024 0432   EGFR 81 06/09/2023 0909   GFRNONAA >60 05/21/2024 0432   GFRNONAA 73 10/04/2019 0949    IMAGING past 24 hours MR BRAIN WO CONTRAST Result Date: 05/20/2024 EXAM: MRI BRAIN WITHOUT CONTRAST 05/20/2024 02:42:43 PM TECHNIQUE: Multiplanar  multisequence MRI of the head/brain was performed without the administration of intravenous contrast. COMPARISON: 03/10/2024 CLINICAL HISTORY: Stroke, follow up. FINDINGS: BRAIN AND VENTRICLES: A new 1.7 cm nonhemorrhagic right paramedian spontaneous infarct measures 2.2 cm anterior posterior. Faint T2 hyperintensity is associated with the acute right-sided infarct. Expected evolution of the left paramedian pontine infarct is present. No intracranial hemorrhage. No mass. No midline shift. No hydrocephalus. Atrophy and periventricular white matter changes are otherwise within normal limits for age. The sella is unremarkable. Normal flow voids. ORBITS: Bilateral lens replacements are noted. The globes and orbits are otherwise within normal limits. SINUSES AND MASTOIDS: No acute abnormality. BONES AND SOFT TISSUES: Normal marrow signal. Fusion or ankylosis of the upper cervical spine is noted. No acute soft tissue abnormality. IMPRESSION: 1. New 1.7 cm nonhemorrhagic right paramedian infarct with associated faint T2 hyperintensity. 2. Expected evolution of the left paramedian pontine infarct. Electronically signed by: Lonni Necessary MD 05/20/2024 03:02 PM EST RP Workstation: [id number]   DG Swallowing Func-Speech Pathology Result Date: 05/20/2024 Table formatting from the original result was not included. Modified Barium Swallow Study Patient Details Name: Justin [name] MRN: [id number] Date of Birth: 26-Sep-1940 Today's Date: 05/20/2024 HPI/PMH: HPI: Pt is a  83 y.o. male who presented with slurred speech and left-sided weakness. 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 Previous BSE (03/11/24)  noted pt at baseline swallow function, but considering x1 coughing episode with liquids and h/o ataxia, SLP planned x1 f/u after initation of reg diet/thin liquids with choosing of softer items. Pt failed yale per RN note on 12/19, Patient continuously  coughing on secretions. SLP order placed per MD. PMH: multiple previous strokes, hypertension, spinocerebellar ataxia type VI. GERD. Clinical Impression: Clinical Impression: Pt presents with oropharyngeal dysphagia likely related to baseline ataxia and new CN V and VII dysfunction. Aspiration of thin and nectar thick liquids was seen via fluoro with weak cough elicited from pt, which was ineffective to clear aspirate (PAS 7). While not all trials of NTL were aspirated (chin tuck mostly effective with small cup sips), what was aspirated was unable to be cleared, placing pt at risk for pulmonary complications. Pt's oral phase is slow and prolonged, often c/b oral holding and posterior and premature escape of bolus into pharyngeal structures. Bolus at times was left in vallecula for extended periods of time prior to swallow initation. This delay in initation, along with poor anterior hyoid excursion and incomplete laryngeal vestibule closure resulted in aspiration events. Poor BOT retraction, poor pharyngeal stripping and incomplete UES opening noted, impacting clearance of bolus. Collection of residue was noted on vallecula consistently across POs (including barium tablet). X1 instance of esophageal retention with retrograde flow through PES observed with thin liquid bolus.   PLAN: Recommend dys 1 diet with honey-thick liquids,meds crushed in puree or may trial whole in puree if needed. Repeat dry/effortful swallows encouraged in order to assist with reduction of pharyngeal residuals. 1:1 supervision recommended during meals in order to assist and cue for strategies. SLP will f/u for tx of dysphagia and assess diet tolerance. Factors that may increase risk of adverse event in presence of aspiration [name]): Factors that may increase risk of adverse event in presence of aspiration [name]): Poor general health and/or compromised immunity; Reduced cognitive function; Weak cough; Frail or  deconditioned Recommendations/Plan: Swallowing Evaluation Recommendations Swallowing Evaluation Recommendations Recommendations: PO diet PO Diet Recommendation: Dysphagia 1 (Pureed); Moderately thick liquids (Level 3, honey thick) Liquid Administration via: Cup; No straw Medication Administration: Crushed with puree Supervision: Staff to assist with self-feeding; Full supervision/cueing for swallowing strategies Swallowing strategies  : Minimize environmental distractions; Slow rate; Small bites/sips; effortful swallow; Multiple dry swallows after each bite/sip; Follow solids with liquids Postural changes: Position pt fully upright for meals; Stay upright 30-60 min after meals Oral care recommendations: Oral care BID (2x/day) Treatment Plan Treatment Plan Treatment recommendations: Therapy as outlined in treatment plan below Follow-up recommendations: Follow physicians's recommendations for discharge plan and follow up therapies Functional status assessment: Patient has had a recent decline in their functional status and demonstrates the ability to make significant improvements in function in a reasonable and predictable amount of time. Treatment frequency: Min 2x/week Treatment duration: 2 weeks Interventions: Aspiration precaution training; Oropharyngeal exercises; Compensatory techniques; Patient/family education; Trials of upgraded texture/liquids; Diet toleration management by SLP Recommendations Recommendations for follow up therapy are one component of a multi-disciplinary discharge planning process, led by the attending physician.  Recommendations may be updated based on patient status, additional functional criteria and insurance authorization. Assessment: Orofacial Exam: Orofacial Exam Oral Cavity: Oral Hygiene: WFL Oral Cavity - Dentition: Adequate natural dentition Orofacial Anatomy: WFL Oral Motor/Sensory Function: Suspected cranial nerve impairment CN V - Trigeminal: Left motor impairment; Left sensory  impairment CN VII - Facial: Left motor impairment CN IX - Glossopharyngeal, CN X -  Vagus: WFL CN XII - Hypoglossal: WFL Anatomy: Anatomy: WFL Boluses Administered: Boluses Administered Boluses Administered: Thin liquids (Level 0); Mildly thick liquids (Level 2, nectar thick); Moderately thick liquids (Level 3, honey thick); Puree; Solid  Oral Impairment Domain: Oral Impairment Domain Lip Closure: Escape beyond mid-chin Tongue control during bolus hold: Posterior escape of greater than half of bolus Bolus preparation/mastication: Slow prolonged chewing/mashing with complete recollection Bolus transport/lingual motion: Delayed initiation of tongue motion (oral holding) Oral residue: Trace residue lining oral structures Location of oral residue : Palate; Tongue Initiation of pharyngeal swallow : Valleculae  Pharyngeal Impairment Domain: Pharyngeal Impairment Domain Soft palate elevation: No bolus between soft palate (SP)/pharyngeal wall (PW) Laryngeal elevation: Complete superior movement of thyroid cartilage with complete approximation of arytenoids to epiglottic petiole Anterior hyoid excursion: Partial anterior movement Epiglottic movement: Complete inversion Laryngeal vestibule closure: Incomplete, narrow column air/contrast in laryngeal vestibule Pharyngeal stripping wave : Present - diminished Pharyngeal contraction (A/P view only): N/A Pharyngoesophageal segment opening: Partial distention/partial duration, partial obstruction of flow Tongue base retraction: Trace column of contrast or air between tongue base and PPW Pharyngeal residue: Collection of residue within or on pharyngeal structures Location of pharyngeal residue: Valleculae; Pyriform sinuses  Esophageal Impairment Domain: Esophageal Impairment Domain Esophageal clearance upright position: Esophageal retention with retrograde flow through the PES Pill: Pill Consistency administered: Mildly thick liquids (Level 2, nectar thick) Mildly thick liquids  (Level 2, nectar thick): WFL Penetration/Aspiration Scale Score: Penetration/Aspiration Scale Score 1.  Material does not enter airway: Moderately thick liquids (Level 3, honey thick); Puree; Solid; Pill 7.  Material enters airway, passes BELOW cords and not ejected out despite cough attempt by patient: Thin liquids (Level 0); Mildly thick liquids (Level 2, nectar thick) Compensatory Strategies: Compensatory Strategies Compensatory strategies: Yes Effortful swallow: Effective Effective Effortful Swallow: Puree Multiple swallows: Effective Effective Multiple Swallows: Puree; Mildly thick liquid (Level 2, nectar thick) Chin tuck: -- (somewhat effective,  minimally)   General Information: Caregiver present: Yes  Diet Prior to this Study: NPO (free water protocol, small bites puree PRN)   Temperature : Normal   Respiratory Status: WFL   Supplemental O2: None (Room air)   History of Recent Intubation: No  Behavior/Cognition: Alert; Cooperative; Pleasant mood Self-Feeding Abilities: Needs assist with self-feeding Baseline vocal quality/speech: Hypophonia/low volume Volitional Cough: Able to elicit Volitional Swallow: Able to elicit Exam Limitations: No limitations Goal Planning: Prognosis for improved oropharyngeal function: Fair Barriers to Reach Goals: Time post onset; Severity of deficits No data recorded Patient/Family Stated Goal: none stated Consulted and agree with results and recommendations: Patient; Nurse Pain: Pain Assessment Pain Assessment: No/denies pain Faces Pain Scale: 0 End of Session: Start Time:SLP Start Time (ACUTE ONLY): 1135 Stop Time: SLP Stop Time (ACUTE ONLY): 1155 Time Calculation:SLP Time Calculation (min) (ACUTE ONLY): 20 min Charges: SLP Evaluations $ SLP Speech Visit: 1 Visit SLP Evaluations $BSS Swallow: 1 Procedure $MBS Swallow: 1 Procedure $ SLP EVAL LANGUAGE/SOUND PRODUCTION: 1 Procedure SLP visit diagnosis: SLP Visit Diagnosis: Dysphagia, oropharyngeal phase (R13.12) Past Medical  History: Past Medical History: Diagnosis Date  Abnormality of gait 09/07/2007  Acute focal neurological deficit 02/21/2014  Allergic rhinitis, cause unspecified 2007  Arthritis   Ataxia   Cervicalgia 02/26/2006  Chest pain   Cramp of limb 10/21/2011  Degeneration of intervertebral disc, site unspecified 1998  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  Diaphragmatic hernia without mention of obstruction or gangrene 1982  Hiatal hernia  Diplopia 2006  Disturbance of skin sensation 2003  Diverticulosis of colon (without mention of hemorrhage) 1997  Dizziness and giddiness 2001  Dysphagia, unspecified(787.20) 1999 and  Esophagitis, unspecified 1997  GERD (gastroesophageal reflux disease) 2003  Hypertension 2003  Impotence of organic origin 1999  Lack of coordination 09/07/2007  Lumbago 2003  Migraine with aura, without mention of intractable migraine without mention of status migrainosus 1997  Myalgia and myositis, unspecified 1999  Other and unspecified hyperlipidemia 2003  Other disorders of vitreous 2003  Other malaise and fatigue 2003  Other seborrheic keratosis 2013  Other seborrheic keratosis 04/13/2009  Pain in joint, site unspecified 04/13/2012  Personal history of fall 04/15/2011  Restless legs syndrome (RLS) 1997  Spinocerebellar disease, unspecified 05/01/2008  Stroke (HCC)   Unspecified hearing loss 2003  Unspecified tinnitus 04/15/2011 Past Surgical History: Past Surgical History: Procedure Laterality Date  APPENDECTOMY  Age 83  boil removed    Dr. Ebbie  C3-4 anterior cervical surgery  1999  Dr. Carles  CARDIAC CATHETERIZATION  2001  Dr. Bulah  COLONOSCOPY  07/20/07  Dr. Jakie  EYE SURGERY Bilateral 2015  cataracts  LAMINOTOMY / EXCISION DISK POSTERIOR CERVICAL SPINE    Spinal Disk (?4-5)  LOOP RECORDER INSERTION N/A 03/13/2024  Procedure: LOOP RECORDER INSERTION;  Surgeon: Inocencio Soyla Lunger, MD;  Location: [facility];  Service: Cardiovascular;   Laterality: N/A; Justin Kin, MA, CCC-SLP Acute Rehabilitation Services Office Number: [phone] Justin Justin [name] 05/20/2024, 1:59 PM   Vitals:   05/21/24 0600 05/21/24 0700 05/21/24 0800 05/21/24 0900  BP: (!) 161/61 (!) 157/82 (!) 144/69 135/62  Pulse: 94 87 (!) 105 88  Resp: 17 (!) 22 (!) 28 (!) 26  Temp:   99.7 F (37.6 C)   TempSrc:   Axillary   SpO2: 92% 92% (!) 77% 94%  Weight:         PHYSICAL EXAM General:  Alert, well-nourished, well-developed patient in no acute distress Psych:  Mood and affect appropriate for situation CV: Regular rate and rhythm on monitor Respiratory:  Regular, unlabored respirations on room air   NEURO:  Mental Status: AA&Ox3 Speech/Language: speech is without aphasia but with severe dysarthria  Cranial Nerves:  II: PERRL.  III, IV, VI: EOMI. Eyelids elevate symmetrically.  V: Sensation is intact to light touch and symmetrical to face.  VII: Left facial droop VIII: hearing intact to voice. IX, X: Voice is severely dysarthric XII: tongue is midline without fasciculations. Motor: Able to move right upper and lower extremities with antigravity strength, no movement of left upper and lower extremities Tone: is normal and bulk is normal Sensation- Intact to light touch bilaterally.  Gait- deferred  Most Recent NIH  1a Level of Conscious.: 0 1b LOC Questions: 0 1c LOC Commands: 0 2 Best Gaze: 0 3 Visual: 0 4 Facial Palsy: 1 5a Motor Arm - left: 4 5b Motor Arm - Right: 0 6a Motor Leg - Left: 4 6b Motor Leg - Right: 0 7 Limb Ataxia: 0 8 Sensory: 0 9 Best Language: 0 10 Dysarthria: 2 11 Extinct. and Inatten.: 0 TOTAL: 11   ASSESSMENT/PLAN  Justin [name] is a 83 y.o. male with history of multiple previous strokes, hypertension, spinocerebellar ataxia type VI admitted for acute onset left-sided weakness and dysarthria.  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  He was given TNK to treat potential  stroke, but mechanical thrombectomy was not done due to poor baseline functional status.  Overnight, left-sided weakness worsened.  MRI reveals bilateral pontine  strokes.  Patient does have significant dysphagia as seen on modified barium swallow study.  He has reported that he does not wish to have a feeding tube.  Goals of care discussion held with patient and family and decision made to transition patient to comfort care.  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 ASPECTS 10.    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 Therapy recommendations:  CIR Disposition: Comfort care, likely inpatient hospice or home with hospice support  Hx of Stroke/TIA Patient has a history of left pontine and occipital infarcts in 10/25  Hypertension Home meds: Propranolol  40 mg twice daily, losartan  100 mg daily, hydrochlorothiazide  12.5 mg daily, amlodipine  5 mg daily Stable Blood Pressure Goal: BP less than 180/105   Hyperlipidemia Home meds: Atorvastatin  10 mg daily, resumed in hospital LDL 34, goal < 70 Statin discontinued as patient on comfort care  Diabetes type II Controlled Home meds: None HgbA1c 6.7, goal < 7.0 CBGs SSI  Dysphagia Patient has post-stroke dysphagia, SLP consulted    Diet   DIET - DYS 1 Room service appropriate? Yes; Fluid consistency: Honey Thick  Allow comfort feeds Advance diet as tolerated  Other Stroke Risk  Factors Advanced age   Other Active Problems Spinocerebellar ataxia type VI-PT/OT/SLP  Hospital day # 2  Patient seen by NP with MD, MD to addend note as needed. Bekka Qian E Everitt Clint Kill , MSN, AGACNP-BC [facility] See Amion for schedule and pager information 05/21/2024 11:42 AM    To contact Stroke Continuity provider, please refer to [email or web address]. After hours, contact General Neurology

## 2024-05-21 NOTE — Progress Notes (Addendum)
 RN noted Justin [name] having more trouble with eating and swallowing, and slight worsening of slurred speech as compared to yesterday. RN voiced concerns about new onset low-grade temperature, flushed appearance, and some slight confusion (patient was requesting to get up and walk, which yesterday he was more neurologically intact). Patient still knows orientation, however with slight confusion.  Notified Vertell, NP, and Dr. Dimitri with the stroke team of concerns for aspiration pneumonia. No new orders at this time.  0930- Re-rounded on patient with Stroke team, and family at bedside. Dr. Dimitri and RN spoke with family and presented the concerns regarding sudden temperature rise, concerns for developing aspiration pneumonia, and subtle deterioration. Justin [name] is neurologically intact, and was apart of this conversation, and verbally consented to NO feeding tube, NO intubation, and we, as the care team, reinforced his DNR wishes.  Family is leaning to comfort care, with wishes to bring Justin [name] home in a day or two through hospice care.  Justin Lascola, RN

## 2024-05-21 NOTE — Progress Notes (Signed)
 OT Cancellation Note  Patient Details Name: Justin [name] MRN: [id number] DOB: Nov 05, 1940   Cancelled Treatment:    Reason Eval/Treat Not Completed: Medical issues which prohibited therapy.  Family deciding on comfort.  OT order discontinued.    Riyansh Gerstner D Teneka Malmberg 05/21/2024, 2:26 PM 05/21/2024  RP, OTR/L  Acute Rehabilitation Services  Office:  [phone]

## 2024-05-22 DIAGNOSIS — I639 Cerebral infarction, unspecified: Secondary | ICD-10-CM

## 2024-05-22 DIAGNOSIS — I739 Peripheral vascular disease, unspecified: Secondary | ICD-10-CM

## 2024-05-22 DIAGNOSIS — Z515 Encounter for palliative care: Secondary | ICD-10-CM

## 2024-05-22 DIAGNOSIS — Z66 Do not resuscitate: Secondary | ICD-10-CM

## 2024-05-22 DIAGNOSIS — I6322 Cerebral infarction due to unspecified occlusion or stenosis of basilar arteries: Secondary | ICD-10-CM | POA: Diagnosis not present

## 2024-05-22 MED ORDER — GLYCOPYRROLATE 0.2 MG/ML IJ SOLN
0.1000 mg | INTRAMUSCULAR | Status: DC | PRN
  Administered 2024-05-22: 0.1 mg via INTRAVENOUS
  Filled 2024-05-22: qty 1

## 2024-05-22 MED ORDER — GLYCOPYRROLATE 0.2 MG/ML IJ SOLN: 0.2000 mg | INTRAMUSCULAR | Status: DC | PRN

## 2024-05-22 MED ORDER — GLYCOPYRROLATE 0.2 MG/ML IJ SOLN
INTRAMUSCULAR | Status: AC
  Filled 2024-05-22: qty 1

## 2024-05-22 MED ORDER — GLYCOPYRROLATE 0.2 MG/ML IJ SOLN
0.2000 mg | INTRAMUSCULAR | Status: DC | PRN
  Administered 2024-05-22: 0.2 mg via INTRAVENOUS

## 2024-05-22 MED ORDER — MORPHINE BOLUS VIA INFUSION
4.0000 mg | INTRAVENOUS | Status: DC | PRN
  Administered 2024-05-22 (×2): 4 mg via INTRAVENOUS

## 2024-05-22 MED ORDER — LORAZEPAM 2 MG/ML IJ SOLN
1.0000 mg | INTRAMUSCULAR | Status: DC
  Administered 2024-05-22 (×2): 1 mg via INTRAVENOUS
  Filled 2024-05-22 (×2): qty 1

## 2024-05-22 MED ORDER — GLYCOPYRROLATE 0.2 MG/ML IJ SOLN
0.4000 mg | INTRAMUSCULAR | Status: DC
  Administered 2024-05-22 (×2): 0.4 mg via INTRAVENOUS
  Filled 2024-05-22 (×2): qty 2

## 2024-05-22 MED ORDER — GLYCOPYRROLATE 0.2 MG/ML IJ SOLN: 0.1000 mg | INTRAMUSCULAR | Status: DC

## 2024-05-22 MED ORDER — MORPHINE 100MG IN NS 100ML (1MG/ML) PREMIX INFUSION
10.0000 mg/h | INTRAVENOUS | Status: DC
  Administered 2024-05-22: 5 mg/h via INTRAVENOUS
  Administered 2024-05-22: 20 mg/h via INTRAVENOUS
  Filled 2024-05-22 (×2): qty 100

## 2024-06-01 NOTE — Progress Notes (Signed)
 "  Palliative Medicine Inpatient Follow Up Note HPI: Justin [name] is a 84 y.o. male with multiple medical problems including PAD, history of previous strokes, hypertension, spinocerebellar ataxia type VI, who was last hospitalized in October 2025 with CVA, and is now admitted 05/19/2024 with same.  Patient was found to have basilar occlusion on CTA and was given TNK.  Hospitalization has been complicated by progressive left-sided weakness, dysphagia, and now with probable aspiration.  MRI reveals bilateral pontine strokes.  Palliative care consulted to address goals.   Today's Discussion 13-Jun-2024  *Please note that this is a verbal dictation therefore any spelling or grammatical errors are due to the Dragon [employer] One system interpretation.  I reviewed the chart notes including nursing notes from today, progress notes from today. I also reviewed vital signs, nursing flowsheets, medication administrations record, labs, and imaging.    I met with Justin [name] at bedside this morning. He was noted to be quite tachypneic using accessory muscles to breath on a NRB facemask and generally uncomfortable in appearance. I spoke with patients son, Justin [name] about starting a morphine  gtt for more comprehensive symptom management. He shared understanding of this.  I shared once the morphine  is at a level where symptoms are well controlled we slowly decrease oxygen.    Created space and opportunity for patients son to explore thoughts feelings and fears regarding current medical situation. We reviewed how he is thankful that he had been traveling up here with his children. He shares that he is glad all of Justin [name]'s grandchildren did get to see him when he was more coherent.   Justin [name] asks if he can speak to his sister about starting the morphine . I shared that they should talk this through and certainly reach out if additional support is needed.  ___________________________ Addendum:  I met with patients son,  Justin [name] and daughter, Justin [name] this afternoon. We reviewed Justin [name]'s current health condition(s) and the decisions which had been made of the patients own volition to shift focus to comfort.  We discussed optimization of symptoms through a morphine  gtt and ativan  atc. We reviewed what to anticipate in terms of the physiological changes, of the patient as he transitions through the active dying process.   Patients family share the goal to maintain comfort.   Questions and concerns addressed/Palliative Support Provided.   Add time: 30  Objective Assessment: Vital Signs Vitals:   06/13/2024 0700 Jun 13, 2024 0800  BP: (!) 147/65 (!) 149/63  Pulse: (!) 105 (!) 110  Resp: (!) 41 (!) 47  Temp:    SpO2: (!) 85% (!) 83%    Intake/Output Summary (Last 24 hours) at 2024-06-13 1140 Last data filed at 06-13-24 0600 Gross per 24 hour  Intake --  Output 850 ml  Net -850 ml   Last Weight  Most recent update: 05/19/2024  1:53 PM    Weight  80.2 kg (176 lb 12.9 oz)            Gen:  Elderly M chronically ill appearing HEENT: Dry mucous membranes CV: Regular rate and rhythm  PULM:  On NRB FM ABD: soft/nontender  EXT: No edema  Neuro: Opens eyes though generally somnolent  SUMMARY OF RECOMMENDATIONS   DNAR/DNI  Morphine  gtt initation with boluses  Ativan  1mg  IVP Q4H  ATC and as needed  Glycopyrrolate  0.4mg  IVP Q4H ATC  Additional comfort medications per [facility]  Anticipate in hospital passing  Ongoing PMT support ______________________________________________________________________________________ Justin [name] [facility] Palliative Medicine Team Team Cell Phone:  [phone] Please utilize secure chat with additional questions, if there is no response within 30 minutes please call the above phone number  Time Spent: 62  Palliative Medicine Team providers are available by phone from 7am to 7pm daily and can be reached through the team cell phone.  Should this patient require  assistance outside of these hours, please call the patient's attending physician.     "

## 2024-06-01 NOTE — TOC CAGE-AID Note (Signed)
 Transition of Care [facility]) - CAGE-AID Screening   Patient Details  Name: Justin [name] MRN: [id number] Date of Birth: 06/29/1940  [facility] CM/SW Contact:    Inocente GORMAN Kindle, LCSW Phone Number: 06/20/24, 9:48 AM   Clinical Narrative: Patient is disoriented and unable to participate in screening   CAGE-AID Screening: Substance Abuse Screening unable to be completed due to: : Patient unable to participate

## 2024-06-01 NOTE — Progress Notes (Addendum)
 Rns Alan and Mackenzie pronounced patient at [phone].  Family was brought to bedside shortly thereafter.

## 2024-06-01 NOTE — Progress Notes (Signed)
 This chaplain responded to PMT NP-Josh consult for EOL spiritual care. The chaplain received an update from PMT NP-Michelle before the visit. The chaplain understands the Pt. son-[name] is at the bedside. The Pt. daughter-[name] is arriving at the bedside soon.  The chaplain began building rapport with Justin [name]. The chaplain understands Justin [name] prefers to discuss next steps and spiritual care with [name] before proceeding with chaplain presence. The chaplain provided education on the supportive role of spiritual care and will check in later today.  Chaplain Leeroy Hummer [phone]

## 2024-06-01 NOTE — Progress Notes (Signed)
 STROKE TEAM PROGRESS NOTE    SIGNIFICANT HOSPITAL EVENTS 12/19: Patient admitted with worsening slurred speech and left-sided weakness and given TNK to treat stroke. 12/21: Patient demonstrating signs of aspiration, goals of care discussion held with patient and family and decision made to transition patient to comfort care   INTERIM HISTORY/SUBJECTIVE  Son is at the bedside.  Patient is awake.  He does have lots of secretions, Robinul  was started and given as well as some morphine , he is tachypneic with decreased oxygen sats and has been placed on a nonrebreather mask.  Patient has been started on a morphine  drip, comfort meds in place while her meds have been discontinued.   Working on obtaining inpatient hospice transfer or possibly home with hospice Son was updated and all questions were answered and he voiced understanding  CBC    Component Value Date/Time   WBC 11.9 (H) 05/21/2024 0432   RBC 4.35 05/21/2024 0432   HGB 13.3 05/21/2024 0432   HCT 38.4 (L) 05/21/2024 0432   PLT 201 05/21/2024 0432   MCV 88.3 05/21/2024 0432   MCH 30.6 05/21/2024 0432   MCHC 34.6 05/21/2024 0432   RDW 13.2 05/21/2024 0432   RDW 13.6 04/12/2013 1244   LYMPHSABS 5.4 (H) 05/19/2024 1347   LYMPHSABS 1.9 04/12/2013 1244   MONOABS 1.2 (H) 05/19/2024 1347   EOSABS 0.2 05/19/2024 1347   EOSABS 0.2 04/12/2013 1244   BASOSABS 0.1 05/19/2024 1347   BASOSABS 0.0 04/12/2013 1244    BMET    Component Value Date/Time   NA 139 05/21/2024 0432   NA 139 04/19/2020 1037   K 3.7 05/21/2024 0432   CL 106 05/21/2024 0432   CO2 21 (L) 05/21/2024 0432   GLUCOSE 136 (H) 05/21/2024 0432   BUN 9 05/21/2024 0432   BUN 19 04/19/2020 1037   CREATININE 0.86 05/21/2024 0432   CREATININE 0.94 06/09/2023 0909   CALCIUM  9.1 05/21/2024 0432   EGFR 81 06/09/2023 0909   GFRNONAA >60 05/21/2024 0432   GFRNONAA 73 10/04/2019 0949    IMAGING past 24 hours No results found.  Vitals:   06/12/2024 0500 12-Jun-2024 0600  2024/06/12 0700 2024/06/12 0800  BP: 135/70 (!) 143/64 (!) 147/65 (!) 149/63  Pulse: (!) 103 (!) 105 (!) 105 (!) 110  Resp: (!) 45 (!) 41 (!) 41 (!) 47  Temp:      TempSrc:      SpO2: (!) 86% (!) 85% (!) 85% (!) 83%  Weight:         PHYSICAL EXAM General: Drowsy, eyes are closed, will open eyes, appears slightly uncomfortable Psych:  Mood and affect appropriate for situation CV: Regular rate and rhythm on monitor Respiratory:  Regular, unlabored respirations on oxygen mask     NEURO:  Mental Status: Drowsy, eyes are closed will open eyes to voice and follow commands.  Oriented to self and place.  Denies any pain Speech/Language: speech is without aphasia but with severe dysarthria   Cranial Nerves:  II: PERRL.  III, IV, VI: EOMI. Eyelids elevate symmetrically.  V: Sensation is intact to light touch and symmetrical to face.  VII: Left facial droop VIII: hearing intact to voice. IX, X: Voice is severely dysarthric XII: tongue is midline without fasciculations. Motor: Able to move right upper and lower extremities with antigravity strength, no movement of left upper and lower extremities Tone: is normal and bulk is normal Sensation- Intact to light touch bilaterally.  Gait- deferred    ASSESSMENT/PLAN Mr. Justin  Sirius [name] is a 84 y.o. male with history of multiple previous strokes, hypertension, spinocerebellar ataxia type VI admitted for acute onset left-sided weakness and dysarthria.  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  He was given TNK to treat potential stroke, but mechanical thrombectomy was not done due to poor baseline functional status.  Overnight, left-sided weakness worsened.  MRI reveals bilateral pontine strokes.  Patient does have significant dysphagia as seen on modified barium swallow study.  He has reported that he does not wish to have a feeding tube.  Goals of care discussion held with patient and family and decision made to transition patient  to comfort care.  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 ASPECTS 10.    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 with reconstitution of the basilar tip MRI bilateral acute pontine infarcts 2D Echo EF 70 to 75%, grade 1 diastolic dysfunction, no interatrial shunt, normal left atrial size LDL 34 HgbA1c 6.7 VTE prophylaxis -SCDs clopidogrel  75 mg daily prior to admission, now on no antithrombotics due to comfort care Therapy recommendations:  CIR Disposition: Comfort care, likely inpatient hospice or home with hospice support   Hx of Stroke/TIA Patient has a history of left pontine and occipital infarcts in 10/25   Hypertension Home meds: Propranolol  40 mg twice daily, losartan  100 mg daily, hydrochlorothiazide  12.5 mg daily, amlodipine  5 mg daily All meds were discontinued due to comfort care measures   Hyperlipidemia Home meds: Atorvastatin  10 mg daily, discontinued due to comfort care measures LDL 34, goal < 70 Statin discontinued as patient on comfort care   Diabetes type II Controlled Home meds: None HgbA1c 6.7, goal < 7.0 CBGs SSI   Dysphagia Patient has post-stroke dysphagia, SLP consulted Allow comfort feeds Advance diet as tolerated   Other Stroke Risk Factors Advanced age     Other Active Problems Spinocerebellar ataxia type VI-PT/OT/SLP  Hospital day # 3   Karna Geralds DNP, ACNPC-AG  [facility]    To contact Stroke Continuity provider, please refer to [email or web address]. After hours, contact General Neurology

## 2024-06-01 NOTE — Death Summary Note (Signed)
 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 year old male history of multiple previous strokes, hypertension, spinocerebellar ataxia type VI admitted for acute onset left-sided weakness and dysarthria. 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 He was given TNK to treat potential stroke, but mechanical thrombectomy was not done due to poor baseline functional status. Overnight, left-sided weakness worsened. MRI reveals bilateral pontine strokes. Patient does have significant dysphagia as seen on modified barium swallow study. He has reported that he does not wish to have a feeding tube. Goals of care discussion held with patient and family and decision made to transition patient to comfort care. NIH on Admission 7   Initial code Stroke CT head revealed no acute abnormality. ASPECTS 10.   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 with reconstitution of the basilar tip.  He received IV TNK and was admitted to the ICU.  Overnight he had worsening of his left side weakness,  and MRI revealed bilateral acute pontine infarcts.  Stroke workup included: 2D Echo EF 70 to 75%, grade 1 diastolic dysfunction, no interatrial shunt, normal left atrial size, LDL 34, HgbA1c 6.7 Due to worsening neurological symptoms and also patient was demonstrating signs of aspiration, as well as poor prognosis of a meaningful neurological recovery  goals of care discussion were held with the family, and family made decision to transition patient to full comfort care.  Palliative care was consulted and following patient.  Patient expired on 05/26/24 at 8165, and family was notified  Pertinent Labs and Studies  Significant Diagnostic Studies MR BRAIN WO CONTRAST Result Date: 05/20/2024 EXAM: MRI BRAIN WITHOUT CONTRAST 05/20/2024 02:42:43 PM TECHNIQUE: Multiplanar multisequence MRI of the head/brain was performed without the administration of intravenous contrast. COMPARISON: 03/10/2024 CLINICAL HISTORY: Stroke, follow up. FINDINGS: BRAIN AND VENTRICLES: A new 1.7 cm nonhemorrhagic right paramedian spontaneous infarct measures 2.2 cm anterior posterior. Faint T2 hyperintensity is associated with the acute right-sided infarct. Expected evolution of the left paramedian pontine infarct is present. No intracranial hemorrhage. No mass. No midline shift. No hydrocephalus. Atrophy and periventricular white matter changes are otherwise within normal limits for age. The sella is unremarkable. Normal flow voids. ORBITS: Bilateral lens replacements are noted. The globes and orbits are otherwise within normal limits. SINUSES AND MASTOIDS: No acute abnormality. BONES AND SOFT TISSUES: Normal marrow signal. Fusion or ankylosis of the upper cervical spine is noted. No acute soft tissue abnormality. IMPRESSION: 1. New 1.7 cm nonhemorrhagic right paramedian infarct with associated faint T2 hyperintensity. 2. Expected evolution of the left paramedian pontine infarct. Electronically signed by: Lonni Necessary MD  05/20/2024 03:02 PM  EST RP Workstation: [id number]   DG Swallowing Func-Speech Pathology Result Date: 05/20/2024 Table formatting from the original result was not included. Modified Barium Swallow Study Patient Details Name: Justin [name] MRN: [id number] Date of Birth: Oct 29, 1940 Today's Date: 05/20/2024 HPI/PMH: HPI: Pt is a  84 y.o. male who presented with slurred speech and left-sided weakness. 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 Previous BSE (03/11/24) noted pt at baseline swallow function, but considering x1 coughing episode with liquids and h/o ataxia, SLP planned x1 f/u after initation of reg diet/thin liquids with choosing of softer items. Pt failed yale per RN note on 12/19, Patient continuously coughing on secretions. SLP order placed per MD. PMH: multiple previous strokes, hypertension, spinocerebellar ataxia type VI. GERD. Clinical Impression: Clinical Impression: Pt presents with oropharyngeal dysphagia likely related to baseline ataxia and new CN V and VII dysfunction. Aspiration of thin and nectar thick liquids was seen via fluoro with weak cough elicited from pt, which was ineffective to clear aspirate (PAS 7). While not all trials of NTL were aspirated (chin tuck mostly effective with small cup sips), what was aspirated was unable to be cleared, placing pt at risk for pulmonary complications. Pt's oral phase is slow and prolonged, often c/b oral holding and posterior and premature escape of bolus into pharyngeal structures. Bolus at times was left in vallecula for extended periods of time prior to swallow initation. This delay in initation, along with poor anterior hyoid excursion and incomplete laryngeal vestibule closure resulted in aspiration events. Poor BOT retraction, poor pharyngeal stripping and incomplete UES opening noted, impacting clearance of bolus. Collection of residue was noted on vallecula consistently across  POs (including barium tablet). X1 instance of esophageal retention with retrograde flow through PES observed with thin liquid bolus.   PLAN: Recommend dys 1 diet with honey-thick liquids,meds crushed in puree or may trial whole in puree if needed. Repeat dry/effortful swallows encouraged in order to assist with reduction of pharyngeal residuals. 1:1 supervision recommended during meals in order to assist and cue for strategies. SLP will f/u for tx of dysphagia and assess diet tolerance. Factors that may increase risk of adverse event in presence of aspiration [name]): Factors that may increase risk of adverse event in presence of aspiration [name]): Poor general health and/or compromised immunity; Reduced cognitive function; Weak cough; Frail or deconditioned Recommendations/Plan: Swallowing Evaluation Recommendations Swallowing Evaluation Recommendations Recommendations: PO diet PO Diet Recommendation: Dysphagia 1 (Pureed); Moderately thick liquids (Level 3, honey thick) Liquid Administration via: Cup; No straw Medication Administration: Crushed with puree Supervision: Staff to assist with self-feeding; Full supervision/cueing for swallowing strategies Swallowing strategies  : Minimize environmental distractions; Slow rate; Small bites/sips; effortful swallow; Multiple dry swallows after each bite/sip; Follow solids with liquids Postural changes: Position pt fully upright for meals; Stay upright 30-60 min after meals Oral care recommendations: Oral care BID (2x/day) Treatment Plan Treatment Plan Treatment recommendations: Therapy as outlined in treatment plan below Follow-up recommendations: Follow physicians's recommendations for discharge plan and follow up therapies Functional status assessment: Patient has had a recent decline in their functional status and demonstrates the ability to make significant improvements in function in a reasonable and predictable amount of time.  Treatment frequency: Min 2x/week Treatment duration: 2 weeks Interventions: Aspiration precaution training; Oropharyngeal exercises; Compensatory techniques; Patient/family education; Trials of upgraded texture/liquids; Diet toleration management by SLP Recommendations Recommendations for follow up therapy are one component  of a multi-disciplinary discharge planning process, led by the attending physician.  Recommendations may be updated based on patient status, additional functional criteria and insurance authorization. Assessment: Orofacial Exam: Orofacial Exam Oral Cavity: Oral Hygiene: WFL Oral Cavity - Dentition: Adequate natural dentition Orofacial Anatomy: WFL Oral Motor/Sensory Function: Suspected cranial nerve impairment CN V - Trigeminal: Left motor impairment; Left sensory impairment CN VII - Facial: Left motor impairment CN IX - Glossopharyngeal, CN X - Vagus: WFL CN XII - Hypoglossal: WFL Anatomy: Anatomy: WFL Boluses Administered: Boluses Administered Boluses Administered: Thin liquids (Level 0); Mildly thick liquids (Level 2, nectar thick); Moderately thick liquids (Level 3, honey thick); Puree; Solid  Oral Impairment Domain: Oral Impairment Domain Lip Closure: Escape beyond mid-chin Tongue control during bolus hold: Posterior escape of greater than half of bolus Bolus preparation/mastication: Slow prolonged chewing/mashing with complete recollection Bolus transport/lingual motion: Delayed initiation of tongue motion (oral holding) Oral residue: Trace residue lining oral structures Location of oral residue : Palate; Tongue Initiation of pharyngeal swallow : Valleculae  Pharyngeal Impairment Domain: Pharyngeal Impairment Domain Soft palate elevation: No bolus between soft palate (SP)/pharyngeal wall (PW) Laryngeal elevation: Complete superior movement of thyroid cartilage with complete approximation of arytenoids to epiglottic petiole Anterior hyoid excursion: Partial anterior movement Epiglottic  movement: Complete inversion Laryngeal vestibule closure: Incomplete, narrow column air/contrast in laryngeal vestibule Pharyngeal stripping wave : Present - diminished Pharyngeal contraction (A/P view only): N/A Pharyngoesophageal segment opening: Partial distention/partial duration, partial obstruction of flow Tongue base retraction: Trace column of contrast or air between tongue base and PPW Pharyngeal residue: Collection of residue within or on pharyngeal structures Location of pharyngeal residue: Valleculae; Pyriform sinuses  Esophageal Impairment Domain: Esophageal Impairment Domain Esophageal clearance upright position: Esophageal retention with retrograde flow through the PES Pill: Pill Consistency administered: Mildly thick liquids (Level 2, nectar thick) Mildly thick liquids (Level 2, nectar thick): WFL Penetration/Aspiration Scale Score: Penetration/Aspiration Scale Score 1.  Material does not enter airway: Moderately thick liquids (Level 3, honey thick); Puree; Solid; Pill 7.  Material enters airway, passes BELOW cords and not ejected out despite cough attempt by patient: Thin liquids (Level 0); Mildly thick liquids (Level 2, nectar thick) Compensatory Strategies: Compensatory Strategies Compensatory strategies: Yes Effortful swallow: Effective Effective Effortful Swallow: Puree Multiple swallows: Effective Effective Multiple Swallows: Puree; Mildly thick liquid (Level 2, nectar thick) Chin tuck: -- (somewhat effective,  minimally)   General Information: Caregiver present: Yes  Diet Prior to this Study: NPO (free water protocol, small bites puree PRN)   Temperature : Normal   Respiratory Status: WFL   Supplemental O2: None (Room air)   History of Recent Intubation: No  Behavior/Cognition: Alert; Cooperative; Pleasant mood Self-Feeding Abilities: Needs assist with self-feeding Baseline vocal quality/speech: Hypophonia/low volume Volitional Cough: Able to elicit Volitional Swallow: Able to elicit Exam  Limitations: No limitations Goal Planning: Prognosis for improved oropharyngeal function: Fair Barriers to Reach Goals: Time post onset; Severity of deficits No data recorded Patient/Family Stated Goal: none stated Consulted and agree with results and recommendations: Patient; Nurse Pain: Pain Assessment Pain Assessment: No/denies pain Faces Pain Scale: 0 End of Session: Start Time:SLP Start Time (ACUTE ONLY): 1135 Stop Time: SLP Stop Time (ACUTE ONLY): 1155 Time Calculation:SLP Time Calculation (min) (ACUTE ONLY): 20 min Charges: SLP Evaluations $ SLP Speech Visit: 1 Visit SLP Evaluations $BSS Swallow: 1 Procedure $MBS Swallow: 1 Procedure $ SLP EVAL LANGUAGE/SOUND PRODUCTION: 1 Procedure SLP visit diagnosis: SLP Visit Diagnosis: Dysphagia, oropharyngeal phase (R13.12) Past Medical History: Past Medical  History: Diagnosis Date  Abnormality of gait 09/07/2007  Acute focal neurological deficit 02/21/2014  Allergic rhinitis, cause unspecified 2007  Arthritis   Ataxia   Cervicalgia 02/26/2006  Chest pain   Cramp of limb 10/21/2011  Degeneration of intervertebral disc, site unspecified 1998  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  Diaphragmatic hernia without mention of obstruction or gangrene 1982  Hiatal hernia  Diplopia 2006  Disturbance of skin sensation 2003  Diverticulosis of colon (without mention of hemorrhage) 1997  Dizziness and giddiness 2001  Dysphagia, unspecified(787.20) 1999 and  Esophagitis, unspecified 1997  GERD (gastroesophageal reflux disease) 2003  Hypertension 2003  Impotence of organic origin 1999  Lack of coordination 09/07/2007  Lumbago 2003  Migraine with aura, without mention of intractable migraine without mention of status migrainosus 1997  Myalgia and myositis, unspecified 1999  Other and unspecified hyperlipidemia 2003  Other disorders of vitreous 2003  Other malaise and fatigue 2003  Other seborrheic keratosis 2013  Other seborrheic keratosis 04/13/2009   Pain in joint, site unspecified 04/13/2012  Personal history of fall 04/15/2011  Restless legs syndrome (RLS) 1997  Spinocerebellar disease, unspecified 05/01/2008  Stroke (HCC)   Unspecified hearing loss 2003  Unspecified tinnitus 04/15/2011 Past Surgical History: Past Surgical History: Procedure Laterality Date  APPENDECTOMY  Age 25  boil removed    Dr. Ebbie  C3-4 anterior cervical surgery  1999  Dr. Carles  CARDIAC CATHETERIZATION  2001  Dr. Bulah  COLONOSCOPY  07/20/07  Dr. Jakie  EYE SURGERY Bilateral 2015  cataracts  LAMINOTOMY / EXCISION DISK POSTERIOR CERVICAL SPINE    Spinal Disk (?4-5)  LOOP RECORDER INSERTION N/A 03/13/2024  Procedure: LOOP RECORDER INSERTION;  Surgeon: Inocencio Soyla Lunger, MD;  Location: [facility];  Service: Cardiovascular;  Laterality: N/A; [name], MA, CCC-SLP Acute Rehabilitation Services Office Number: [phone] [name] 05/20/2024, 1:59 PM  CT ANGIO HEAD NECK W WO CM (CODE STROKE) Result Date: 05/19/2024 EXAM: CTA HEAD AND NECK WITHOUT AND WITH 05/19/2024 02:21:30 PM TECHNIQUE: CTA of the head and neck was performed without and with the administration of 75 mL of iohexol  (OMNIPAQUE ) 350 MG/ML injection. Multiplanar 2D and/or 3D reformatted images are provided for review. Automated exposure control, iterative reconstruction, and/or weight based adjustment of the mA/kV was utilized to reduce the radiation dose to as low as reasonably achievable. Stenosis of the internal carotid arteries measured using NASCET criteria. COMPARISON: CTA head and neck 03/07/2024 CLINICAL HISTORY: Neuro deficit, acute, stroke suspected. FINDINGS: CTA NECK: AORTIC ARCH AND ARCH VESSELS: No dissection or arterial injury. No significant stenosis of the brachiocephalic or subclavian arteries. CERVICAL CAROTID ARTERIES: No dissection, arterial injury, or hemodynamically significant stenosis by NASCET criteria. CERVICAL VERTEBRAL ARTERIES: No dissection, arterial  injury, or significant stenosis. LUNGS AND MEDIASTINUM: Biapical pleural calcifications. SOFT TISSUES: No acute abnormality. BONES: Fusion of the C2-C3-C4 vertebra. CTA HEAD: ANTERIOR CIRCULATION: No significant stenosis of the internal carotid arteries. No significant stenosis of the anterior cerebral arteries. No significant stenosis of the middle cerebral arteries. Unchanged 2.5 mm rounded/ bulbous configuration to an anterior cerebral artery branch point (series 6, image 70). 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 There is a right posterior communicating artery supplying the right posterior cerebral arteries, which are patent. There is slightly decreased caliber/attenuation of the left posterior cerebral artery branches without occlusive disease. No significant stenosis of the vertebral arteries. No aneurysm. OTHER: No dural venous sinus  thrombosis on this non-dedicated study. IMPRESSION: 1. 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 2. This finding was communicated to Dr. Michaela via the [facility] text paging service at 2:40 PM. 3. Otherwise, no hemodynamically significant stenosis of the major cervical or intracranial arteries. 4. A 2.5 mm rounded appearance of an anterior cerebral artery branch point may represent confluence of vessels versus a small aneurysm. Electronically signed by: Prentice Spade MD 05/19/2024 03:32 PM EST RP Workstation: [id number]   CT HEAD CODE STROKE WO CONTRAST Result Date: 05/19/2024 EXAM: CT HEAD WITHOUT CONTRAST 05/19/2024 02:03:20 PM TECHNIQUE: CT of the head was performed without the administration of intravenous contrast. Automated exposure control, iterative reconstruction, and/or weight based adjustment of the mA/kV was utilized to reduce the radiation dose to as low as reasonably achievable. COMPARISON: CT head 03/10/2024 and MRI brain 03/10/2024.  CLINICAL HISTORY: Neuro deficit, acute, stroke suspected. FINDINGS: BRAIN AND VENTRICLES: No acute hemorrhage. No evidence of acute territorial infarct. No hydrocephalus. No extra-axial collection. No mass effect or midline shift. There is chronic evolution of a left pontine infarction. There is overall similar mild parenchymal volume loss. There is overall similar mild-to-moderate scattered white matter hypodensities which are nonspecific but most commonly represent chronic microvascular ischemic changes. Small chronic left occipital cortical infarction seen to greater advantage on prior MRI brain. Aspect score 10/10. ORBITS: Bilateral lens replacement. SINUSES: No acute abnormality. SOFT TISSUES AND SKULL: No acute soft tissue abnormality. No skull fracture. IMPRESSION: 1. No acute intracranial hemorrhage or evidence of acute territorial infarction. ASPECTS score 10/10. 2. This finding was communicated to Dr. Michaela via the [facility] text paging service at 2:40 pm. Electronically signed by: Prentice Spade MD 05/19/2024 03:06 PM EST RP Workstation: [id number]   CUP PACEART REMOTE DEVICE CHECK Result Date: 05/16/2024 ILR summary report received. Battery status OK. Normal device function. No new symptom, tachy, brady, or pause episodes. No new AF episodes. Monthly summary reports and ROV/PRN LA, CVRS   Microbiology Recent Results (from the past 240 hours)  MRSA Next Gen by PCR, Nasal     Status: None   Collection Time: 05/19/24  5:03 PM   Specimen: Nasal Mucosa; Nasal Swab  Result Value Ref Range Status   MRSA by PCR Next Gen NOT DETECTED NOT DETECTED Final    Comment: (NOTE) The GeneXpert MRSA Assay (FDA approved for NASAL specimens only), is one component of a comprehensive MRSA colonization surveillance program. It is not intended to diagnose MRSA infection nor to guide or monitor treatment for MRSA infections. Test performance is not FDA approved in patients less than 5 years old. Performed  at [facility], [address] [place], [place] [address]     Lab Basic Metabolic Panel: Recent Labs  Lab 05/21/24 0432  NA 139  K 3.7  CL 106  CO2 21*  GLUCOSE 136*  BUN 9  CREATININE 0.86  CALCIUM  9.1   Liver Function Tests: No results for input(s): AST, ALT, ALKPHOS, BILITOT, PROT, ALBUMIN in the last 168 hours. No results for input(s): LIPASE, AMYLASE in the last 168 hours. No results for input(s): AMMONIA in the last 168 hours. CBC: Recent Labs  Lab 05/21/24 0432  WBC 11.9*  HGB 13.3  HCT 38.4*  MCV 88.3  PLT 201   Cardiac Enzymes: No results for input(s): CKTOTAL, CKMB, CKMBINDEX, TROPONINI in the last 168 hours. Sepsis Labs: Recent Labs  Lab 05/21/24 0432  WBC 11.9*  Karna DELENA Geralds 05/27/2024, 11:54 AM   "

## 2024-06-01 NOTE — TOC Progression Note (Addendum)
 [facility]) - Progression Note    Patient Details  Name: Justin [name] MRN: [id number] Date of Birth: 05/25/1941  [facility] CM/SW Contact  Rayneisha Bouza E Aubrianne Molyneux, LCSW Phone Number: 2024/05/27, 10:04 AM  Clinical Narrative:    Reached out to Cheri with [facility] to inquire about eligibility.   12:25- Per Magdalena, patient is eligible for [facility] however family was told patient would be an anticipated hospital death. Palliative to follow up regarding which is more appropriate for this patient and family.                    Expected Discharge Plan and Services                                               Social Drivers of Health (SDOH) Interventions SDOH Screenings   Food Insecurity: Patient Declined (03/10/2024)  Housing: Unknown (03/10/2024)  Transportation Needs: No Transportation Needs (03/10/2024)  Utilities: Not At Risk (03/10/2024)  Depression (PHQ2-9): Low Risk (04/03/2024)  Social Connections: Moderately Isolated (03/10/2024)  Tobacco Use: Medium Risk (05/19/2024)    Readmission Risk Interventions     No data to display

## 2024-06-01 DEATH — deceased

## 2024-06-03 ENCOUNTER — Ambulatory Visit: Payer: Self-pay | Admitting: Cardiology

## 2024-06-14 ENCOUNTER — Ambulatory Visit

## 2024-06-19 ENCOUNTER — Ambulatory Visit

## 2024-07-15 ENCOUNTER — Ambulatory Visit

## 2024-07-20 ENCOUNTER — Ambulatory Visit

## 2024-08-15 ENCOUNTER — Ambulatory Visit

## 2024-08-21 ENCOUNTER — Ambulatory Visit

## 2024-08-31 ENCOUNTER — Ambulatory Visit: Payer: Self-pay | Admitting: Nurse Practitioner

## 2024-09-04 ENCOUNTER — Ambulatory Visit: Admitting: Nurse Practitioner
